# Patient Record
Sex: Male | Born: 1935 | Race: White | Hispanic: No | Marital: Married | Smoking: Former smoker
Health system: Southern US, Community
[De-identification: ages and names within clinical notes are randomized; demographics above are authoritative.]

## PROBLEM LIST (undated history)

## (undated) DIAGNOSIS — G4730 Sleep apnea, unspecified: Secondary | ICD-10-CM

## (undated) DIAGNOSIS — I482 Chronic atrial fibrillation, unspecified: Secondary | ICD-10-CM

## (undated) DIAGNOSIS — F0390 Unspecified dementia without behavioral disturbance: Secondary | ICD-10-CM

## (undated) DIAGNOSIS — I10 Essential (primary) hypertension: Secondary | ICD-10-CM

## (undated) DIAGNOSIS — M1990 Unspecified osteoarthritis, unspecified site: Secondary | ICD-10-CM

## (undated) DIAGNOSIS — R339 Retention of urine, unspecified: Secondary | ICD-10-CM

## (undated) DIAGNOSIS — E119 Type 2 diabetes mellitus without complications: Secondary | ICD-10-CM

## (undated) DIAGNOSIS — E785 Hyperlipidemia, unspecified: Secondary | ICD-10-CM

## (undated) HISTORY — DX: Essential (primary) hypertension: I10

## (undated) HISTORY — DX: Unspecified dementia, unspecified severity, without behavioral disturbance, psychotic disturbance, mood disturbance, and anxiety: F03.90

## (undated) HISTORY — DX: Hyperlipidemia, unspecified: E78.5

## (undated) HISTORY — PX: PROSTATE SURGERY: SHX751

## (undated) HISTORY — PX: BACK SURGERY: SHX140

## (undated) HISTORY — PX: TONSILLECTOMY: SUR1361

## (undated) HISTORY — DX: Chronic atrial fibrillation, unspecified: I48.20

---

## 2000-09-30 ENCOUNTER — Encounter: Admission: RE | Admit: 2000-09-30 | Discharge: 2000-12-29 | Payer: Self-pay | Admitting: Anesthesiology

## 2001-08-05 ENCOUNTER — Encounter: Payer: Self-pay | Admitting: Neurosurgery

## 2001-08-08 ENCOUNTER — Inpatient Hospital Stay (HOSPITAL_COMMUNITY): Admission: RE | Admit: 2001-08-08 | Discharge: 2001-08-09 | Payer: Self-pay | Admitting: Neurosurgery

## 2001-08-08 ENCOUNTER — Encounter: Payer: Self-pay | Admitting: Neurosurgery

## 2004-02-14 ENCOUNTER — Inpatient Hospital Stay (HOSPITAL_COMMUNITY): Admission: EM | Admit: 2004-02-14 | Discharge: 2004-02-24 | Payer: Self-pay | Admitting: Emergency Medicine

## 2004-03-19 ENCOUNTER — Encounter: Admission: RE | Admit: 2004-03-19 | Discharge: 2004-03-19 | Payer: Self-pay | Admitting: Internal Medicine

## 2005-06-08 ENCOUNTER — Observation Stay (HOSPITAL_COMMUNITY): Admission: EM | Admit: 2005-06-08 | Discharge: 2005-06-10 | Payer: Self-pay | Admitting: Emergency Medicine

## 2005-06-08 ENCOUNTER — Ambulatory Visit: Payer: Self-pay | Admitting: Cardiology

## 2005-06-08 ENCOUNTER — Encounter: Payer: Self-pay | Admitting: Cardiology

## 2005-06-10 ENCOUNTER — Ambulatory Visit: Payer: Self-pay

## 2005-07-22 ENCOUNTER — Ambulatory Visit: Payer: Self-pay | Admitting: *Deleted

## 2005-07-24 ENCOUNTER — Ambulatory Visit: Payer: Self-pay

## 2005-07-30 ENCOUNTER — Ambulatory Visit: Payer: Self-pay | Admitting: *Deleted

## 2005-08-03 ENCOUNTER — Ambulatory Visit: Payer: Self-pay | Admitting: Cardiology

## 2005-08-07 ENCOUNTER — Ambulatory Visit: Payer: Self-pay | Admitting: *Deleted

## 2005-08-07 ENCOUNTER — Ambulatory Visit: Payer: Self-pay | Admitting: Internal Medicine

## 2005-08-14 ENCOUNTER — Ambulatory Visit: Payer: Self-pay | Admitting: Internal Medicine

## 2005-08-19 ENCOUNTER — Ambulatory Visit: Payer: Self-pay | Admitting: *Deleted

## 2005-08-21 ENCOUNTER — Ambulatory Visit: Payer: Self-pay | Admitting: Cardiology

## 2005-08-31 ENCOUNTER — Ambulatory Visit: Payer: Self-pay | Admitting: Internal Medicine

## 2005-09-17 ENCOUNTER — Ambulatory Visit: Payer: Self-pay | Admitting: Cardiology

## 2005-10-05 ENCOUNTER — Ambulatory Visit: Payer: Self-pay | Admitting: *Deleted

## 2005-10-15 ENCOUNTER — Ambulatory Visit: Payer: Self-pay | Admitting: Cardiology

## 2005-11-13 ENCOUNTER — Ambulatory Visit: Payer: Self-pay | Admitting: Internal Medicine

## 2005-12-16 ENCOUNTER — Ambulatory Visit: Payer: Self-pay

## 2005-12-24 ENCOUNTER — Ambulatory Visit: Payer: Self-pay | Admitting: Internal Medicine

## 2006-01-05 ENCOUNTER — Ambulatory Visit: Payer: Self-pay | Admitting: *Deleted

## 2006-01-14 ENCOUNTER — Ambulatory Visit: Payer: Self-pay | Admitting: *Deleted

## 2006-01-14 ENCOUNTER — Ambulatory Visit: Payer: Self-pay | Admitting: Cardiology

## 2006-01-22 ENCOUNTER — Ambulatory Visit: Payer: Self-pay | Admitting: Cardiology

## 2006-02-01 ENCOUNTER — Ambulatory Visit: Payer: Self-pay | Admitting: Internal Medicine

## 2006-02-15 ENCOUNTER — Ambulatory Visit: Payer: Self-pay | Admitting: Internal Medicine

## 2006-03-08 ENCOUNTER — Ambulatory Visit: Payer: Self-pay | Admitting: Internal Medicine

## 2006-04-05 ENCOUNTER — Ambulatory Visit: Payer: Self-pay | Admitting: Internal Medicine

## 2006-04-14 ENCOUNTER — Ambulatory Visit: Payer: Self-pay | Admitting: *Deleted

## 2006-04-20 ENCOUNTER — Ambulatory Visit: Payer: Self-pay

## 2006-05-03 ENCOUNTER — Ambulatory Visit: Payer: Self-pay | Admitting: Cardiology

## 2006-05-20 ENCOUNTER — Ambulatory Visit: Payer: Self-pay | Admitting: Cardiology

## 2006-05-24 ENCOUNTER — Ambulatory Visit: Payer: Self-pay | Admitting: Cardiology

## 2006-05-25 ENCOUNTER — Ambulatory Visit: Payer: Self-pay | Admitting: Cardiology

## 2006-11-04 ENCOUNTER — Encounter: Admission: RE | Admit: 2006-11-04 | Discharge: 2006-11-04 | Payer: Self-pay | Admitting: Internal Medicine

## 2007-01-11 ENCOUNTER — Ambulatory Visit: Payer: Self-pay | Admitting: *Deleted

## 2007-05-13 ENCOUNTER — Emergency Department (HOSPITAL_COMMUNITY): Admission: EM | Admit: 2007-05-13 | Discharge: 2007-05-13 | Payer: Self-pay | Admitting: Emergency Medicine

## 2007-05-25 ENCOUNTER — Ambulatory Visit: Payer: Self-pay | Admitting: *Deleted

## 2007-08-24 ENCOUNTER — Ambulatory Visit: Payer: Self-pay | Admitting: Cardiovascular Disease

## 2007-09-15 ENCOUNTER — Ambulatory Visit: Payer: Self-pay | Admitting: Cardiology

## 2007-11-30 ENCOUNTER — Ambulatory Visit: Payer: Self-pay | Admitting: Cardiology

## 2007-11-30 ENCOUNTER — Ambulatory Visit: Payer: Self-pay | Admitting: Internal Medicine

## 2007-12-07 ENCOUNTER — Ambulatory Visit: Payer: Self-pay | Admitting: Cardiovascular Disease

## 2007-12-13 ENCOUNTER — Ambulatory Visit: Payer: Self-pay | Admitting: Cardiology

## 2007-12-27 ENCOUNTER — Ambulatory Visit: Payer: Self-pay | Admitting: Cardiology

## 2007-12-27 LAB — CONVERTED CEMR LAB
INR: 4.4 — ABNORMAL HIGH (ref 0.8–1.0)
Prothrombin Time: 26.9 s — ABNORMAL HIGH (ref 10.9–13.3)

## 2008-01-12 ENCOUNTER — Ambulatory Visit: Payer: Self-pay | Admitting: Cardiology

## 2008-02-02 ENCOUNTER — Ambulatory Visit: Payer: Self-pay | Admitting: Cardiology

## 2008-02-17 ENCOUNTER — Ambulatory Visit: Payer: Self-pay | Admitting: Internal Medicine

## 2008-03-02 ENCOUNTER — Ambulatory Visit: Payer: Self-pay | Admitting: Internal Medicine

## 2008-03-07 ENCOUNTER — Ambulatory Visit: Payer: Self-pay | Admitting: Cardiovascular Disease

## 2008-03-23 ENCOUNTER — Ambulatory Visit: Payer: Self-pay | Admitting: Cardiology

## 2008-04-06 ENCOUNTER — Ambulatory Visit: Payer: Self-pay | Admitting: Internal Medicine

## 2008-04-27 ENCOUNTER — Ambulatory Visit: Payer: Self-pay | Admitting: Cardiology

## 2008-05-25 ENCOUNTER — Ambulatory Visit: Payer: Self-pay | Admitting: Cardiovascular Disease

## 2008-06-21 ENCOUNTER — Ambulatory Visit: Payer: Self-pay | Admitting: Cardiology

## 2008-07-20 ENCOUNTER — Ambulatory Visit: Payer: Self-pay | Admitting: Cardiology

## 2008-08-03 ENCOUNTER — Ambulatory Visit: Payer: Self-pay | Admitting: Cardiovascular Disease

## 2008-08-24 ENCOUNTER — Ambulatory Visit: Payer: Self-pay | Admitting: Cardiovascular Disease

## 2008-09-21 ENCOUNTER — Ambulatory Visit: Payer: Self-pay | Admitting: Cardiovascular Disease

## 2008-10-19 ENCOUNTER — Ambulatory Visit: Payer: Self-pay | Admitting: Internal Medicine

## 2008-11-21 ENCOUNTER — Ambulatory Visit: Payer: Self-pay | Admitting: Internal Medicine

## 2008-11-27 DIAGNOSIS — K219 Gastro-esophageal reflux disease without esophagitis: Secondary | ICD-10-CM

## 2008-11-27 DIAGNOSIS — I10 Essential (primary) hypertension: Secondary | ICD-10-CM

## 2008-11-27 DIAGNOSIS — I482 Chronic atrial fibrillation, unspecified: Secondary | ICD-10-CM

## 2008-11-27 DIAGNOSIS — M129 Arthropathy, unspecified: Secondary | ICD-10-CM

## 2008-11-27 DIAGNOSIS — N400 Enlarged prostate without lower urinary tract symptoms: Secondary | ICD-10-CM

## 2008-11-27 DIAGNOSIS — E785 Hyperlipidemia, unspecified: Secondary | ICD-10-CM | POA: Insufficient documentation

## 2008-11-27 HISTORY — DX: Gastro-esophageal reflux disease without esophagitis: K21.9

## 2008-11-27 HISTORY — DX: Essential (primary) hypertension: I10

## 2008-11-27 HISTORY — DX: Arthropathy, unspecified: M12.9

## 2008-11-27 HISTORY — DX: Chronic atrial fibrillation, unspecified: I48.20

## 2008-11-27 HISTORY — DX: Benign prostatic hyperplasia without lower urinary tract symptoms: N40.0

## 2008-11-28 ENCOUNTER — Ambulatory Visit: Payer: Self-pay | Admitting: Internal Medicine

## 2008-12-04 ENCOUNTER — Ambulatory Visit: Payer: Self-pay | Admitting: Internal Medicine

## 2008-12-05 ENCOUNTER — Ambulatory Visit: Payer: Self-pay | Admitting: Internal Medicine

## 2008-12-05 ENCOUNTER — Ambulatory Visit: Payer: Self-pay | Admitting: Cardiology

## 2008-12-06 ENCOUNTER — Ambulatory Visit: Payer: Self-pay | Admitting: Cardiology

## 2008-12-06 ENCOUNTER — Ambulatory Visit: Payer: Self-pay | Admitting: Internal Medicine

## 2008-12-11 ENCOUNTER — Ambulatory Visit: Payer: Self-pay | Admitting: Internal Medicine

## 2008-12-18 ENCOUNTER — Ambulatory Visit: Payer: Self-pay | Admitting: Internal Medicine

## 2008-12-26 ENCOUNTER — Ambulatory Visit: Payer: Self-pay | Admitting: Internal Medicine

## 2009-01-01 ENCOUNTER — Ambulatory Visit: Payer: Self-pay | Admitting: Internal Medicine

## 2009-01-14 ENCOUNTER — Ambulatory Visit: Payer: Self-pay | Admitting: Internal Medicine

## 2009-01-30 ENCOUNTER — Ambulatory Visit: Payer: Self-pay | Admitting: Internal Medicine

## 2009-02-04 ENCOUNTER — Ambulatory Visit: Payer: Self-pay | Admitting: Internal Medicine

## 2009-02-11 ENCOUNTER — Ambulatory Visit: Payer: Self-pay | Admitting: Internal Medicine

## 2009-02-19 ENCOUNTER — Encounter: Payer: Self-pay | Admitting: Cardiovascular Disease

## 2009-02-19 ENCOUNTER — Ambulatory Visit: Payer: Self-pay | Admitting: Cardiovascular Disease

## 2009-03-04 ENCOUNTER — Ambulatory Visit: Payer: Self-pay | Admitting: Internal Medicine

## 2009-03-13 ENCOUNTER — Ambulatory Visit: Payer: Self-pay | Admitting: Internal Medicine

## 2009-04-03 ENCOUNTER — Ambulatory Visit: Payer: Self-pay | Admitting: Internal Medicine

## 2009-04-04 ENCOUNTER — Encounter: Payer: Self-pay | Admitting: Cardiovascular Disease

## 2009-04-17 ENCOUNTER — Ambulatory Visit: Payer: Self-pay | Admitting: Internal Medicine

## 2009-05-07 ENCOUNTER — Encounter: Payer: Self-pay | Admitting: Cardiovascular Disease

## 2009-05-07 ENCOUNTER — Ambulatory Visit: Payer: Self-pay | Admitting: Internal Medicine

## 2009-05-21 ENCOUNTER — Encounter: Payer: Self-pay | Admitting: *Deleted

## 2009-06-03 ENCOUNTER — Ambulatory Visit: Payer: Self-pay | Admitting: Internal Medicine

## 2009-06-04 ENCOUNTER — Encounter: Payer: Self-pay | Admitting: Internal Medicine

## 2009-06-10 ENCOUNTER — Ambulatory Visit: Payer: Self-pay | Admitting: Internal Medicine

## 2009-06-17 ENCOUNTER — Ambulatory Visit: Payer: Self-pay | Admitting: Internal Medicine

## 2009-06-26 ENCOUNTER — Encounter: Payer: Self-pay | Admitting: *Deleted

## 2009-07-01 ENCOUNTER — Ambulatory Visit: Payer: Self-pay | Admitting: Internal Medicine

## 2009-07-29 ENCOUNTER — Ambulatory Visit: Payer: Self-pay | Admitting: Internal Medicine

## 2009-08-27 ENCOUNTER — Ambulatory Visit: Payer: Self-pay | Admitting: Internal Medicine

## 2009-08-27 ENCOUNTER — Encounter: Payer: Self-pay | Admitting: Cardiovascular Disease

## 2009-09-24 ENCOUNTER — Encounter: Payer: Self-pay | Admitting: Cardiovascular Disease

## 2009-09-24 ENCOUNTER — Ambulatory Visit: Payer: Self-pay | Admitting: Internal Medicine

## 2009-10-15 ENCOUNTER — Telehealth: Payer: Self-pay | Admitting: Cardiovascular Disease

## 2009-10-24 ENCOUNTER — Encounter: Payer: Self-pay | Admitting: Cardiovascular Disease

## 2009-10-24 ENCOUNTER — Ambulatory Visit: Payer: Self-pay | Admitting: Internal Medicine

## 2009-10-30 ENCOUNTER — Telehealth (INDEPENDENT_AMBULATORY_CARE_PROVIDER_SITE_OTHER): Payer: Self-pay | Admitting: *Deleted

## 2009-10-30 ENCOUNTER — Encounter (INDEPENDENT_AMBULATORY_CARE_PROVIDER_SITE_OTHER): Payer: Self-pay | Admitting: *Deleted

## 2009-11-04 ENCOUNTER — Ambulatory Visit: Payer: Self-pay | Admitting: Internal Medicine

## 2009-11-11 ENCOUNTER — Ambulatory Visit: Payer: Self-pay | Admitting: Internal Medicine

## 2009-11-26 ENCOUNTER — Ambulatory Visit: Payer: Self-pay | Admitting: Internal Medicine

## 2009-11-26 ENCOUNTER — Encounter: Payer: Self-pay | Admitting: Cardiovascular Disease

## 2009-11-27 ENCOUNTER — Encounter: Payer: Self-pay | Admitting: Cardiovascular Disease

## 2009-12-24 ENCOUNTER — Ambulatory Visit: Payer: Self-pay | Admitting: Internal Medicine

## 2010-01-07 ENCOUNTER — Ambulatory Visit: Payer: Self-pay | Admitting: Internal Medicine

## 2010-01-22 ENCOUNTER — Ambulatory Visit: Payer: Self-pay | Admitting: Internal Medicine

## 2010-02-24 ENCOUNTER — Encounter: Payer: Self-pay | Admitting: Cardiovascular Disease

## 2010-02-24 ENCOUNTER — Ambulatory Visit: Payer: Self-pay | Admitting: Internal Medicine

## 2010-03-06 ENCOUNTER — Encounter: Payer: Self-pay | Admitting: Cardiovascular Disease

## 2010-03-10 ENCOUNTER — Ambulatory Visit: Payer: Self-pay | Admitting: Internal Medicine

## 2010-03-25 ENCOUNTER — Ambulatory Visit: Payer: Self-pay | Admitting: Internal Medicine

## 2010-04-22 ENCOUNTER — Ambulatory Visit: Payer: Self-pay | Admitting: Internal Medicine

## 2010-05-17 ENCOUNTER — Encounter: Payer: Self-pay | Admitting: Cardiovascular Disease

## 2010-05-20 ENCOUNTER — Telehealth: Payer: Self-pay | Admitting: Cardiovascular Disease

## 2010-05-22 ENCOUNTER — Ambulatory Visit: Payer: Self-pay | Admitting: Internal Medicine

## 2010-05-22 ENCOUNTER — Encounter: Payer: Self-pay | Admitting: Cardiovascular Disease

## 2010-05-23 ENCOUNTER — Ambulatory Visit: Payer: Self-pay | Admitting: Cardiovascular Disease

## 2010-05-23 ENCOUNTER — Telehealth: Payer: Self-pay | Admitting: Cardiovascular Disease

## 2010-05-24 LAB — CONVERTED CEMR LAB
Chloride: 97 meq/L (ref 96–112)
Creatinine, Ser: 1.8 mg/dL — ABNORMAL HIGH (ref 0.4–1.5)
GFR calc non Af Amer: 39.56 mL/min (ref >60)
Potassium: 3.3 meq/L — ABNORMAL LOW (ref 3.5–5.1)

## 2010-05-27 ENCOUNTER — Encounter: Payer: Self-pay | Admitting: Cardiovascular Disease

## 2010-05-27 ENCOUNTER — Ambulatory Visit: Payer: Self-pay | Admitting: Internal Medicine

## 2010-05-27 DIAGNOSIS — R0989 Other specified symptoms and signs involving the circulatory and respiratory systems: Secondary | ICD-10-CM

## 2010-05-27 DIAGNOSIS — R0609 Other forms of dyspnea: Secondary | ICD-10-CM | POA: Insufficient documentation

## 2010-05-27 HISTORY — DX: Other specified symptoms and signs involving the circulatory and respiratory systems: R09.89

## 2010-05-27 HISTORY — DX: Other forms of dyspnea: R06.09

## 2010-05-29 ENCOUNTER — Encounter: Payer: Self-pay | Admitting: Cardiovascular Disease

## 2010-06-02 ENCOUNTER — Encounter: Admission: RE | Admit: 2010-06-02 | Discharge: 2010-06-02 | Payer: Self-pay | Admitting: Internal Medicine

## 2010-06-02 LAB — CONVERTED CEMR LAB
CO2: 33 meq/L — ABNORMAL HIGH (ref 19–32)
Calcium: 9.3 mg/dL (ref 8.4–10.5)
GFR calc non Af Amer: 34.94 mL/min (ref >60)
Glucose, Bld: 124 mg/dL — ABNORMAL HIGH (ref 70–99)
Potassium: 3.8 meq/L (ref 3.5–5.1)
Sodium: 140 meq/L (ref 135–145)

## 2010-06-06 ENCOUNTER — Ambulatory Visit: Payer: Self-pay

## 2010-06-09 ENCOUNTER — Ambulatory Visit: Payer: Self-pay | Admitting: Internal Medicine

## 2010-06-17 ENCOUNTER — Ambulatory Visit: Payer: Self-pay | Admitting: Cardiology

## 2010-06-17 ENCOUNTER — Ambulatory Visit (HOSPITAL_COMMUNITY): Admission: RE | Admit: 2010-06-17 | Discharge: 2010-06-17 | Payer: Self-pay | Admitting: Cardiovascular Disease

## 2010-06-17 ENCOUNTER — Encounter: Payer: Self-pay | Admitting: Cardiovascular Disease

## 2010-06-24 ENCOUNTER — Ambulatory Visit: Payer: Self-pay | Admitting: Internal Medicine

## 2010-07-21 ENCOUNTER — Ambulatory Visit: Payer: Self-pay | Admitting: Internal Medicine

## 2010-08-21 ENCOUNTER — Ambulatory Visit: Payer: Self-pay | Admitting: Internal Medicine

## 2010-08-21 ENCOUNTER — Encounter: Payer: Self-pay | Admitting: Cardiovascular Disease

## 2010-09-22 ENCOUNTER — Ambulatory Visit: Payer: Self-pay | Admitting: Internal Medicine

## 2010-10-20 ENCOUNTER — Ambulatory Visit: Payer: Self-pay | Admitting: Internal Medicine

## 2010-11-10 ENCOUNTER — Ambulatory Visit: Payer: Self-pay | Admitting: Internal Medicine

## 2010-11-18 ENCOUNTER — Encounter: Payer: Self-pay | Admitting: Cardiovascular Disease

## 2010-11-18 ENCOUNTER — Ambulatory Visit: Payer: Self-pay | Admitting: Internal Medicine

## 2010-11-21 ENCOUNTER — Ambulatory Visit: Payer: Self-pay | Admitting: Cardiovascular Disease

## 2010-12-01 ENCOUNTER — Encounter: Payer: Self-pay | Admitting: Cardiovascular Disease

## 2010-12-01 ENCOUNTER — Ambulatory Visit: Payer: Self-pay

## 2010-12-01 ENCOUNTER — Ambulatory Visit: Payer: Self-pay | Admitting: Cardiovascular Disease

## 2010-12-18 ENCOUNTER — Ambulatory Visit: Payer: Self-pay | Admitting: Internal Medicine

## 2011-01-19 ENCOUNTER — Ambulatory Visit
Admission: RE | Admit: 2011-01-19 | Discharge: 2011-01-19 | Payer: Self-pay | Source: Home / Self Care | Attending: Internal Medicine | Admitting: Internal Medicine

## 2011-01-20 NOTE — Assessment & Plan Note (Signed)
Summary: [facility]   Visit Type:  Follow-up Primary Provider:  Dr Wylene Simmer  CC:  Low potassium- Sob.  History of Present Illness: 77 year old male with atrial fibrillation presenting today for follow-up evaluation. The patient has had recent troubles with cough and shortness of breath. He was seen at Urgent Care last wk and diagnosed with bronchitis and started on ABx. He was also told he had CHF.   He complains of continued productive cough with clear sputum. Denies fever, chills. Denies chest pain. He has chronic orthopnea and complains of worsening dyspnea since his respiratory illness has started.  He was also prescribed a short course of furosemide and had a lot of difficulty with urinary incontinence while taking the diuretic.  Current Medications (verified): 1)  Engage Study Drug .... Take As Directed By [facility] Nurse 2)  Vitamin C Cr 500 Mg Cr-Tabs (Ascorbic Acid) .... Take 1 Tablet By Mouth Once A Day 3)  Metoprolol Succinate 50 Mg Xr24h-Tab (Metoprolol Succinate) .... Take One Tablet By Mouth Twicw Daily 4)  Hydrochlorothiazide 12.5 Mg Tabs (Hydrochlorothiazide) .... Take One Tablet By Mouth Daily. 5)  Pantoprazole Sodium 40 Mg Tbec (Pantoprazole Sodium) .... Take 1 Tablet By Mouth Once A Day 6)  Niaspan 500 Mg Cr-Tabs (Niacin (Antihyperlipidemic)) .... Take 1 Tablet By Mouth Once A Day 7)  Vesicare 5 Mg Tabs (Solifenacin Succinate) .... Take 1 Tablet By Mouth Two Times A Day 8)  Multivitamins  Tabs (Multiple Vitamin) .... Take 1 Tablet By Mouth Once A Day  Allergies: 1)  ! Sulfa  Past History:  Past medical history reviewed for relevance to current acute and chronic problems.  Past Medical History: Reviewed history from 11/27/2008 and no changes required. Current Problems:  GERD (ICD-530.81) HYPERLIPIDEMIA-MIXED (ICD-272.4) HYPERTENSION, BENIGN (ICD-401.1) Atrial Fibrillation Paroxysmal Chronic Coumadin BPH Spondylolisthesis, L5-S1, with secondary  stenosis, with right L5 radiculopathy. Degenerative disk disease  Review of Systems       Negative except as per HPI   Vital Signs:  Patient profile:   77 year old male Height:      66 inches Weight:      221 pounds BMI:     35.80 Pulse rate:   88 / minute Pulse rhythm:   irregular Resp:     20 per minute BP sitting:   112 / 70  (right arm) Cuff size:   large  Vitals Entered By: Vikki Ports (May 27, 2010 3:18 PM)  Physical Exam  General:  Pt is alert and oriented, elderly, obese male, in no acute distress. He is coughing frequently during the evaluation. HEENT: normal Neck: normal carotid upstrokes without bruits, JVP normal Lungs: scattered rhonchi, no rales. CV: irregularly irregular Abd: soft, NT, positive BS, no bruit, no organomegaly Ext: no clubbing, cyanosis, or edema. peripheral pulses 2+ and equal Skin: warm and dry without rash    EKG  Procedure date:  05/27/2010  Findings:      Atrial fibrillation with low voltage, HR 88 bpm.  Impression & Recommendations:  Problem # 1:  OTHER DYSPNEA AND RESPIRATORY ABNORMALITIES (ICD-786.09) The patient has an upper respiratory infection. I don't think CHF is playing a major role. Will check an echo to rule out new LV dysfunction. His atrial fib is chronic and heart rate is ok at present. He did not improve with a therapeutic trial of lasix. He sees Dr Wylene Simmer later this week.  His updated medication list for this problem includes:    Metoprolol Succinate 50  Mg Xr24h-tab (Metoprolol succinate) .Marland Kitchen... Take one tablet by mouth twicw daily    Hydrochlorothiazide 12.5 Mg Tabs (Hydrochlorothiazide) .Marland Kitchen... Take one tablet by mouth daily.  Orders: EKG w/ Interpretation (93000) Echocardiogram (Echo) TLB-BMP (Basic Metabolic Panel-BMET) (80048-METABOL)  Problem # 2:  ATRIAL FIBRILLATION (ICD-427.31) Stable, continue Engage study drug for anticoagulation and metoprolol for rate control. He does not have active wheezing on  exam. His updated medication list for this problem includes:    Metoprolol Succinate 50 Mg Xr24h-tab (Metoprolol succinate) .Marland Kitchen... Take one tablet by mouth twicw daily  Orders: EKG w/ Interpretation (93000) Echocardiogram (Echo) TLB-BMP (Basic Metabolic Panel-BMET) (80048-METABOL)  Patient Instructions: 1)  Your physician recommends that you have lab work today: BMP 2)  Your physician wants you to follow-up in:  1 YEAR. You will receive a reminder letter in the mail two months in advance. If you don't receive a letter, please call our office to schedule the follow-up appointment. 3)  Your physician has requested that you have an echocardiogram.  Echocardiography is a painless test that uses sound waves to create images of your heart. It provides your doctor with information about the size and shape of your heart and how well your heart's chambers and valves are working.  This procedure takes approximately one hour. There are no restrictions for this procedure.

## 2011-01-20 NOTE — Progress Notes (Signed)
  Phone Note Outgoing Call      New/Updated Medications: K-TABS 10 MEQ CR-TABS (POTASSIUM CHLORIDE) take 1 tablet by mouth daily Prescriptions: K-TABS 10 MEQ CR-TABS (POTASSIUM CHLORIDE) take 1 tablet by mouth daily  #30 x 3   Entered by:   Lisabeth Devoid RN   Authorized by:   Norva Karvonen, MD   Signed by:   Lisabeth Devoid RN on 05/23/2010   Method used:   [facility]        [facility]  [address] [phone]* (retail)       [address]       [place], [place]  [address]       Ph: [phone] or [phone]       Fax: [phone]   RxID:   [id number]   Appended Document:  called and spoke with patient's wife. he is stable with no real change in symptoms. added onto schedule next week and started KCl 10 meq.

## 2011-01-20 NOTE — Consult Note (Signed)
Summary: [facility]   Imported By: Marylou Mccoy 05/22/2010 16:25:05  _____________________________________________________________________  External Attachment:    Type:   Image     Comment:   External Document

## 2011-01-20 NOTE — Letter (Signed)
Summary: [facility] Engage Study Prohibited Medication List  [facility] Engage Study Prohibited Medication List   Imported By: Roderic Ovens 04/09/2010 11:09:02  _____________________________________________________________________  External Attachment:    Type:   Image     Comment:   External Document

## 2011-01-20 NOTE — Assessment & Plan Note (Signed)
Summary: abnormal ekg    Visit Type:  ov Primary Provider:  Dr Wylene Simmer  CC:  shortness of breath and dizziness.  History of Present Illness: 77 year old male with atrial fibrillation presenting today for follow-up evaluation. He was recently seen by our Research group and was noted to have elevated heart rates. He has chronic AF but his rates have been running consistently greater than 100 bpm. He denies chest pain or pressure. He complains of chronic cough, dyspnea, and fatigue. He leads a very sedentary lifestyle. No other complaints.  Current Medications (verified): 1)  Engage Study Drug .... Take As Directed By [facility] Nurse 2)  Vitamin C Cr 500 Mg Cr-Tabs (Ascorbic Acid) .... Take 1 Tablet By Mouth Once A Day 3)  Metoprolol Succinate 50 Mg Xr24h-Tab (Metoprolol Succinate) .... Take One Tablet By Mouth Twicw Daily 4)  Hydrochlorothiazide 12.5 Mg Tabs (Hydrochlorothiazide) .... Take One Tablet By Mouth Daily. 5)  Pantoprazole Sodium 40 Mg Tbec (Pantoprazole Sodium) .... Take 1 Tablet By Mouth Once A Day 6)  Niaspan 500 Mg Cr-Tabs (Niacin (Antihyperlipidemic)) .... Take 1 Tablet By Mouth Once A Day 7)  Multivitamins  Tabs (Multiple Vitamin) .... Take 1 Tablet By Mouth Once A Day  Allergies (verified): 1)  ! Sulfa  Past History:  Past medical history reviewed for relevance to current acute and chronic problems.  Past Medical History: Reviewed history from 11/27/2008 and no changes required. Current Problems:  GERD (ICD-530.81) HYPERLIPIDEMIA-MIXED (ICD-272.4) HYPERTENSION, BENIGN (ICD-401.1) Atrial Fibrillation Paroxysmal Chronic Coumadin BPH Spondylolisthesis, L5-S1, with secondary stenosis, with right L5 radiculopathy. Degenerative disk disease  Review of Systems       Negative except as per HPI   Vital Signs:  Patient profile:   77 year old male Height:      66 inches Weight:      227.25 pounds BMI:     36.81 Pulse rate:   109 / minute Resp:     22 per  minute BP sitting:   98 / 64  (left arm) Cuff size:   regular  Vitals Entered By: Caralee Ates CMA (November 21, 2010 2:09 PM)  Physical Exam  General:  Pt is alert and oriented, elderly, obese male, in no acute distress.  HEENT: normal Neck: normal carotid upstrokes without bruits, JVP normal Lungs: scattered rhonchi, no rales. CV: irregularly irregular, tachycardic Abd: soft, obese, NT, positive BS Ext: 1+ bilateral pretibial edema.  Skin: warm and dry with stasis changes predominately on left leg    EKG  Procedure date:  11/21/2010  Findings:      Atrial fibrillation HR 109 bpm, incomplete RBBB  Impression & Recommendations:  Problem # 1:  ATRIAL FIBRILLATION (ICD-427.31) Pt with elevated heart rate, otherwise unchanged. He is followed through the Engage study protocol. Recommend add digoxin 0.125 mg daily and check a dig level in 2 weeks since he has CKD. Will check a repeat EKG in 2 weeks as well. Clinical followup in 3 months.  His updated medication list for this problem includes:    Metoprolol Succinate 50 Mg Xr24h-tab (Metoprolol succinate) .Marland Kitchen... Take one tablet by mouth twicw daily    Digoxin 0.125 Mg Tabs (Digoxin) .Marland Kitchen... Take one tablet by mouth daily  His updated medication list for this problem includes:    Metoprolol Succinate 50 Mg Xr24h-tab (Metoprolol succinate) .Marland Kitchen... Take one tablet by mouth twicw daily    Digoxin 0.125 Mg Tabs (Digoxin) .Marland Kitchen... Take one tablet by mouth daily  Problem # 2:  HYPERTENSION, BENIGN (ICD-401.1) BP low today, favor digoxin over increasing metoprolol because of low BP.  His updated medication list for this problem includes:    Metoprolol Succinate 50 Mg Xr24h-tab (Metoprolol succinate) .Marland Kitchen... Take one tablet by mouth twicw daily    Hydrochlorothiazide 12.5 Mg Tabs (Hydrochlorothiazide) .Marland Kitchen... Take one tablet by mouth daily.  BP today: 98/64 Prior BP: 112/70 (05/27/2010)  Labs Reviewed: K+: 3.8 (05/27/2010) Creat: : 2.0  (05/27/2010)     Problem # 3:  HYPERLIPIDEMIA-MIXED (ICD-272.4)  His updated medication list for this problem includes:    Niaspan 500 Mg Cr-tabs (Niacin (antihyperlipidemic)) .Marland Kitchen... Take 1 tablet by mouth once a day  His updated medication list for this problem includes:    Niaspan 500 Mg Cr-tabs (Niacin (antihyperlipidemic)) .Marland Kitchen... Take 1 tablet by mouth once a day  Patient Instructions: 1)  Your physician has recommended you make the following change in your medication: START Digoxin 0.125mg  take one tablet daily 2)  Your physician wants you to follow-up in:  3 MONTHS with Dr Excell Seltzer.  You will receive a reminder letter in the mail two months in advance. If you don't receive a letter, please call our office to schedule the follow-up appointment. 3)  Your physician recommends that you return for lab work and and EKG in 2 WEEKS: Digoxin level (427.31)--do not take digoxin the morning of lab work  Prescriptions: DIGOXIN 0.125 MG TABS (DIGOXIN) take one tablet by mouth daily  #30 x 6   Entered by:   Julieta Gutting, RN, BSN   Authorized by:   Norva Karvonen, MD   Signed by:   Julieta Gutting, RN, BSN on 11/21/2010   Method used:   [facility]  [address] [phone]* (retail)       [address]       [place], [place]  [id number]       Ph: [phone] or [phone]       Fax: [phone]   RxID:   [id number]

## 2011-01-20 NOTE — Progress Notes (Signed)
Summary: seen in urgent care  Phone Note Call from Patient Call back at Home Phone [phone] Call back at [phone]   Caller: Spouse Reason for Call: Talk to Nurse Summary of Call: was seen @ urgent care on Saturday, request call Initial call taken by: Migdalia Dk,  May 20, 2010 9:46 AM  Follow-up for Phone Call        I spoke with the pt's wife and she said the pt was seen at [facility] on Saturday.  The pt's oxygen was low, he was out of rhythm, fluid in lungs and a touch of bronchitis.  The pt was started on Furosemide and an antibiotic. I asked the pt's wife if he would like to be seen today by Dr Excell Seltzer.  She did not want to make an appt at this time.  I instructed her to follow the instructions that were given to her by Urgent Care and call the office if the pt's symptoms are not improving.  She agreed with plan.    Follow-up by: Julieta Gutting, RN, BSN,  May 20, 2010 10:22 AM

## 2011-01-22 NOTE — Assessment & Plan Note (Signed)
Summary: ekg/427.31/also labs/saf  Nurse Visit   Allergies: 1)  ! Sulfa   Patient Instructions: 1)  Dr Excell Seltzer has reviewed EKG. Pt advised will receive a call regarding lab: digoxin level. Pt verbalizes understanding.

## 2011-02-16 ENCOUNTER — Encounter (INDEPENDENT_AMBULATORY_CARE_PROVIDER_SITE_OTHER): Payer: Self-pay

## 2011-02-16 ENCOUNTER — Encounter: Payer: Self-pay | Admitting: Cardiovascular Disease

## 2011-02-16 DIAGNOSIS — R0989 Other specified symptoms and signs involving the circulatory and respiratory systems: Secondary | ICD-10-CM

## 2011-02-24 ENCOUNTER — Encounter (INDEPENDENT_AMBULATORY_CARE_PROVIDER_SITE_OTHER): Payer: Self-pay

## 2011-02-24 DIAGNOSIS — R0989 Other specified symptoms and signs involving the circulatory and respiratory systems: Secondary | ICD-10-CM

## 2011-03-03 ENCOUNTER — Ambulatory Visit (INDEPENDENT_AMBULATORY_CARE_PROVIDER_SITE_OTHER): Payer: Medicare Other | Admitting: Cardiovascular Disease

## 2011-03-03 ENCOUNTER — Encounter: Payer: Self-pay | Admitting: Cardiovascular Disease

## 2011-03-03 DIAGNOSIS — I4891 Unspecified atrial fibrillation: Secondary | ICD-10-CM

## 2011-03-12 ENCOUNTER — Telehealth: Payer: Self-pay | Admitting: Cardiovascular Disease

## 2011-03-12 DIAGNOSIS — N3281 Overactive bladder: Secondary | ICD-10-CM

## 2011-03-12 NOTE — Telephone Encounter (Signed)
I spoke with the pt's wife and she wanted to make [place] aware that the pt's Enablex was stopped.  The pt has now been started on toviaz 8mg  once a day.  She just wanted to update the medication list. The pt's medication list has not been entered into this system at this time.  Toviaz added to med list.

## 2011-03-18 ENCOUNTER — Encounter (INDEPENDENT_AMBULATORY_CARE_PROVIDER_SITE_OTHER): Payer: Medicare Other

## 2011-03-18 DIAGNOSIS — R0989 Other specified symptoms and signs involving the circulatory and respiratory systems: Secondary | ICD-10-CM

## 2011-03-19 NOTE — Assessment & Plan Note (Signed)
Summary: [facility]   Visit Type:  Follow-up Primary Provider:  Dr Wylene Simmer  CC:  irregular heart beat.  History of Present Illness: 77 year old male with atrial fibrillation presenting today for follow-up evaluation. He is followed for chronic atrial fibrillation and CAD. The patient is very sedentary. He denies chest pain. Chronic dyspnea is unchanged. He has lightheadedness but denies syncope. No other complaints.  Current Medications (verified): 1)  Engage Study Drug .... Take As Directed By [facility] Nurse 2)  Vitamin C Cr 500 Mg Cr-Tabs (Ascorbic Acid) .... Take 1 Tablet By Mouth Once A Day 3)  Metoprolol Succinate 50 Mg Xr24h-Tab (Metoprolol Succinate) .... Take One Tablet By Mouth  Once A Day 4)  Hydrochlorothiazide 12.5 Mg Tabs (Hydrochlorothiazide) .... Take One Tablet By Mouth Daily. 5)  Pantoprazole Sodium 40 Mg Tbec (Pantoprazole Sodium) .... Take 1 Tablet By Mouth Once A Day 6)  Niaspan 500 Mg Cr-Tabs (Niacin (Antihyperlipidemic)) .... Take 1 Tablet By Mouth Once A Day 7)  Multivitamins  Tabs (Multiple Vitamin) .... Take 1 Tablet By Mouth Once A Day 8)  Digoxin 0.125 Mg Tabs (Digoxin) .... Take One Tablet By Mouth Daily 9)  Tamsulosin Hcl 0.4 Mg Caps (Tamsulosin Hcl) .... Take 1 Pill By Mouth Daily. 10)  Enablex .... Take One By Mouth Daily (Dr Vonita Moss Gave Pt Samples)  Allergies: 1)  ! Sulfa  Past History:  Past medical history reviewed for relevance to current acute and chronic problems.  Past Medical History: Reviewed history from 11/27/2008 and no changes required. Current Problems:  GERD (ICD-530.81) HYPERLIPIDEMIA-MIXED (ICD-272.4) HYPERTENSION, BENIGN (ICD-401.1) Atrial Fibrillation Paroxysmal Chronic Coumadin BPH Spondylolisthesis, L5-S1, with secondary stenosis, with right L5 radiculopathy. Degenerative disk disease  Review of Systems       Positive for chronic cough, otherwise negative except as per HPI   Vital Signs:  Patient profile:   77 year old  male Height:      66 inches Weight:      223.75 pounds BMI:     36.24 Pulse rate:   68 / minute Pulse rhythm:   irregular Resp:     20 per minute BP sitting:   114 / 60  (left arm) Cuff size:   large  Vitals Entered By: Vikki Ports (March 03, 2011 11:11 AM)  Physical Exam  General:  Pt is alert and oriented, elderly, obese male, in no acute distress.  HEENT: normal Neck: normal carotid upstrokes without bruits, JVP normal Lungs: scattered rhonchi, no rales. CV: irregularly irregular without murmur or gallop Abd: soft, obese, NT, positive BS Ext: 1+ bilateral pretibial edema.  Skin: warm and dry with stasis changes predominately on left leg    EKG  Procedure date:  03/03/2011  Findings:      Atrial fibrillation with HR 68 bpm.  Impression & Recommendations:  Problem # 1:  ATRIAL FIBRILLATION (ICD-427.31) Heart rate much better controlled with the addition of digoxin. He will continue on metoprolol as well. Anticoagulation per the Engage protocol.  His updated medication list for this problem includes:    Metoprolol Succinate 50 Mg Xr24h-tab (Metoprolol succinate) .Marland Kitchen... Take one tablet by mouth  once a day    Digoxin 0.125 Mg Tabs (Digoxin) .Marland Kitchen... Take one tablet by mouth daily  Orders: EKG w/ Interpretation (93000)  Problem # 2:  HYPERTENSION, BENIGN (ICD-401.1) controlled.  His updated medication list for this problem includes:    Metoprolol Succinate 50 Mg Xr24h-tab (Metoprolol succinate) .Marland Kitchen... Take one tablet by mouth  once  a day    Hydrochlorothiazide 12.5 Mg Tabs (Hydrochlorothiazide) .Marland Kitchen... Take one tablet by mouth daily.  Orders: EKG w/ Interpretation (93000)  BP today: 114/60 Prior BP: 98/64 (11/21/2010)  Labs Reviewed: K+: 3.8 (05/27/2010) Creat: : 2.0 (05/27/2010)     Problem # 3:  HYPERLIPIDEMIA-MIXED (ICD-272.4) Followed by PCP.  His updated medication list for this problem includes:    Niaspan 500 Mg Cr-tabs (Niacin (antihyperlipidemic))  .Marland Kitchen... Take 1 tablet by mouth once a day  Orders: EKG w/ Interpretation (93000)  Patient Instructions: 1)  Your physician recommends that you continue on your current medications as directed. Please refer to the Current Medication list given to you today. 2)  Your physician wants you to follow-up in: 6 MONTHS.  You will receive a reminder letter in the mail two months in advance. If you don't receive a letter, please call our office to schedule the follow-up appointment.

## 2011-04-20 ENCOUNTER — Encounter (INDEPENDENT_AMBULATORY_CARE_PROVIDER_SITE_OTHER): Payer: Medicare Other

## 2011-04-20 DIAGNOSIS — R0989 Other specified symptoms and signs involving the circulatory and respiratory systems: Secondary | ICD-10-CM
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
## 2011-05-05 NOTE — Assessment & Plan Note (Signed)
[facility]                            CARDIOLOGY OFFICE NOTE   NAME:[name], [name] HOOGENDOORN                     MRN:          [id number]  DATE:03/07/2008                            DOB:          1926-08-19    [name] [name] was seen in followup at the [facility] office on  March 07, 2008.  [name] [name] as an 77 year old gentleman with paroxysmal  atrial fibrillation.  He part of the RELY study and now has completed  that trial and is back on chronic Coumadin.  [name] [name] has done  relatively well from a symptomatic standpoint.  He is obese and leads a  very sedentary lifestyle.  With activity, he denies any symptoms.  He  specifically denies chest pain, dyspnea, edema, lightheadedness, syncope  or palpitations.  He has no complaints at this time.   MEDICATIONS:  1. Aspirin 81 mg daily.  2. Vitamin C 500 mg daily.  3. Multivitamin daily.  4. Metoprolol 100 mg daily.  5. Hydrochlorothiazide 12.5 mg daily.  6. Protonix 40 mg daily.  7. Niaspan 500 mg daily.  8. Oxybutynin 5 mg twice daily.  9. Warfarin as directed.   ALLERGIES:  SULFA.   PHYSICAL EXAMINATION:  GENERAL:  The patient is alert and oriented and  in no acute distress.  He is an obese elderly gentleman.  VITAL SIGNS:  Weight 227. Blood pressure 110/72, heart rate 71,  respiratory rate 16.  HEENT:  Normal.  NECK:  Normal carotid upstrokes without bruits.  Jugular venous pressure  is normal.  LUNGS:  Clear bilaterally.  HEART:  Distant and regular.  No murmurs or gallops.  ABDOMEN:  Soft, obese, nontender, no organomegaly.  EXTREMITIES:  There is trace edema on the right and 1+ edema on the left  with venous stasis changes on the left   EKG shows sinus rhythm and is within normal limits.   ASSESSMENT:  1. Paroxysmal atrial fibrillation.  The patient has been in sinus      rhythm at his last to office visits.  Continue warfarin and      metoprolol.  [name] [name] has some gait  unsteadiness but has not had      a fall in the last several months, and he has never had a serious      fall.  I think he remains a reasonable Coumadin candidate.  2. Hypertension.  Blood pressure is well controlled on combination of      metoprolol and hydrochlorothiazide  3. Dyslipidemia.  He remains on Niaspan.  He is followed by Dr.      Wylene Simmer.   For followup, I would like to see [name] [name] back in 1 year.  He is  followed regularly by Dr. Wylene Simmer and is stable at this time from a  cardiac standpoint.     Veverly Fells. Excell Seltzer, MD  Electronically Signed    MDC/MedQ  DD: 03/07/2008  DT: 03/07/2008  Job #: [id number]   cc:   Gaspar Garbe, M.D.

## 2011-05-05 NOTE — Assessment & Plan Note (Signed)
HEALTHCARE                            CARDIOLOGY OFFICE NOTE   NAME:[name] [name]                     MRN:          [id number]  DATE:08/24/2007                            DOB:          February 14, 1926    [name] [name] was seen in outpatient followup in the [facility]  office on August 24, 2007.  He is a 77 year old gentleman who has been  followed in the past by Dr. Glennon Hamilton.  I will be assuming his care  from here forward.  [name] has chronic atrial fibrillation and has  been treated with rate control and anticoagulation.  He is enrolled in  the RE-LY trial.  He has been randomized to dabigatran and is taking  this medication instead of Coumadin.  He denies any cardiovascular  symptoms at this time.  He specifically denies chest pain, dyspnea,  edema, lightheadedness, syncope, or palpitations.  [name] is  sedentary.  He does minimal exercise [facility] [employer] and rides a  stationary bike a few times weekly, but it sounds like he works out at a  very light level.  He has no other complaints at this time.   CURRENT MEDICATIONS:  1. Aspirin 81 mg daily.  2. Vitamin C 500 mg daily.  3. Multivitamin daily.  4. Vitamin E daily.  5. Metoprolol 100 mg daily.  6. Hydrochlorothiazide 12.5 mg daily.  7. Protonix 40 mg daily.  8. Niaspan 500 mg daily.  9. Study drug twice daily.  10.Vesicare 5 mg daily.   ALLERGIES:  SULFA.   PHYSICAL EXAMINATION:  GENERAL:  The patient is an elderly man.  He is  in no acute distress.  He is obese.  VITAL SIGNS: Weight 225 pounds.  Blood pressure 100/64, heart rate 94,  respiratory rate 16.  HEENT:  Normal.  NECK:  Normal carotid upstrokes without bruits.  Jugular venous pressure  is normal.  LUNGS:  Clear bilaterally.  CARDIOVASCULAR:  The apex is discrete.  The heart is irregularly  irregular without murmurs or gallops.  ABDOMEN: Soft, obese, nontender.  No organomegaly.  EXTREMITIES:   There is trace pretibial edema with some stasis dermatitis  bilaterally.   EKG shows atrial fibrillation and is otherwise within normal limits.   ASSESSMENT:  1. Chronic atrial fibrillation.  Continue metoprolol for rate control      and RE-LY study drug for anticoagulation.  Will reassess left      ventricular function at the time of his next visit in 6 months with      a 2-D echocardiogram.  2. Hypertension under optimal control with a combination of metoprolol      and hydrochlorothiazide.  3. Dyslipidemia.  [name] is on Niaspan and is followed by Dr.      Wylene Simmer.   For followup, I would like to see [name] back in 6 months or sooner  if any new problems arise.     Veverly Fells. Excell Seltzer, MD  Electronically Signed    MDC/MedQ  DD: 08/24/2007  DT: 08/24/2007  Job #: [id number]   cc:  Gaspar Garbe, M.D.

## 2011-05-08 NOTE — Procedures (Signed)
[facility]  Patient:    [name] [name], [name] [name]                       MRN: [id number] Proc. Date: 09/30/00 Attending:  Thyra Breed, M.D. CC:         Marlan Palau, M.D.   Procedure Report  DATE OF BIRTH:  Feb 29, 1926  PROCEDURE:  Lumbar epidural steroid injection.  DIAGNOSES:  Lumbar spondylosis with neural foraminal stenosis at multiple levels, most pronounced at L5-S1 to the right.  INDICATIONS:  [name] [name] is a very pleasant 77 year old, who is sent to [place] by Dr. Lesia Sago for a series of lumbar steroid injections.  The patient stated that he was in his usual state of health up until about eight months ago when he began to have increasing lower back discomfort.  He saw [name], chiropractor and was treated with adjustments but did not improve. He was sent by [name] to [name] who initially evaluated the patient August 23, at which time he was concerned about an underlying radiculopathy. He was started on Voltaren and an MRI was obtained on August 21, 2000, which showed multilevel degenerative disk disease with neural foraminal stenosis bilaterally, 3-4 down to L5-S1 most pronounced to the left greater than right at L5-S1.  He was treated with Voltaren which was of minimal improvement. EMGs and nerve conduction studies showed he had possible ______ of the right lumbosacral paraspinal muscles.  After the MRI, he was sent to [place] for a series of lumbar epidural steroid injections.  The patient describes his pain as "hell."  It is a sharp shooting pain in the right buttock down to the right lower extremity to the sole and lateral aspect of his right foot.  It is increased by walking and standing and improved by sitting or lying down.  There is no numbness or tingling or bowel or bladder incontinence.  He is uncertain as to whether he may have weakness associated with this.  He has not responded to chiropractic nor Voltaren.  CURRENT  MEDICATIONS:  Voltaren, hydrochlorothiazide and Prilosec.  ALLERGIES:  SULFONAMIDES and MIDRIN.  FAMILY HISTORY:  Positive for diabetes, strokes, and coronary artery disease.  PAST SURGICAL HISTORY:  Significant for tonsillectomy and prostrate surgery in 1985.  SOCIAL HISTORY:  The patient is a nonsmoker, rarely drinks.  He used to work at [employer] and is retired.  PAST MEDICAL HISTORY:  Active medical problems are gastroesophageal reflux disease and hypertension.  REVIEW OF SYSTEMS:  GENERAL:  Negative.  HEAD:  Negative.  EYES:  Significant for what sounds like amaurosis fugax of the right eye lasting about 10 minutes in recent weeks.  He has bilateral cataracts.  He has not consulted with [name] with regard to the amaurosis fugax.  NOSE, MOUTH, THROAT: Negative.  EARS:  Negative.  PULMONARY:  Negative.  CARDIOVASCULAR: Significant for hypertension, diagnosed recently for which he has been placed on hydrochlorothiazide.  GASTROINTESTINAL:  Significant for gastroesophageal reflux disease for which he takes Prilosec.  GENITOURINARY:  History of prostate problems in the past.  MUSCULOSKELETAL:  Negative.  NEUROLOGIC:  No history of seizure or stroke.  See HPI for pertinent positives.  HEMATOLOGIC: Negative.  CUTANEOUS:  Significant for hives and allergy problems for which he has had allergy shots in the past.  ENDOCRINE:  Negative.  PSYCHIATRIC: Negative.  PHYSICAL EXAMINATION:  VITAL SIGNS:  Blood pressure 153/96, heart rate 101, respiratory rate 16. O2  saturation is 96% with pain level 6/10.  Temperature is 98 degrees.  GENERAL:  This is a pleasant male who looks his stated age.  He is moderately obese.  HEENT:  Head was normocephalic, atraumatic.  Eyes:  Extraocular movements intact with conjunctivae and sclerae clear.  Nose:  Patent naris without discharge.  Oropharynx is free of lesions.  NECK:  Demonstrates mild restriction and range of motion.  Carotids were  2+ and symmetrical without appreciable bruits today.  LUNGS:  Clear.  HEART:  Regular rate and rhythm.  ABDOMEN:  Obese.  GENITALIA/RECTAL:  Examinations not performed.  BACK:  Examination revealed negative straight leg raise sign with no tenderness to percussion over the vertebrae.  EXTREMITIES:  Radial pulse and dorsalis pedis pulses were 2+ and symmetric. He had varicosities of the lower extremities bilaterally.  NEUROLOGICAL:  The patient was oriented to person, place, reason for visit, and time.  Cranial nerves II-XII were grossly intact.  Deep tendon reflexes were symmetric in the upper and lower extremities with downgoing toes. Motor was 5/5 with symmetric bulk and tone.  Pinprick and vibratory sense were intact.  Coordination was grossly intact.  IMPRESSION: 1. Lumbar degenerative disk disease with neural foraminal stenosis with right    lower extremity radicular like pain with no associated neurologic deficits. 2. Hypertension per primary care physician. 3. Gastroesophageal reflux disease, per primary care physician. 4. History of allergic diaphysis with recurrent hives and previous    ______ . 5. Questionable history of blindness in the right eye temporarily for    10 minutes in recent weeks.  Rule out possible underlying amaurosis fugax.    He was encouraged to speak with [name] with regard to this.  DISPOSITION:  I discussed the potential risks, benefits, and limitations of a lumbar epidural steroid injection in detail with the patient as well as reviewed the side effects of corticosteroids.  He is interested in pursing this.  DESCRIPTION OF PROCEDURE:  After informed consent was obtained, the patient was placed in the sitting position on a monitor.  His back was prepped with Betadine x 3.  A skin well was raised at the L4-5 interspace with 1% lidocaine.  A 20 gauge Tuohy needle was introduced into the lumbar epidural  space to a loss of resistance to  preservative-free saline.  There was no CSF nor blood.  Then 80 mg of Medrol and 10 mL of preservative-free normal saline was gently injected.  The needle was flushed with preservative-free normal saline and removed intact.  POSTPROCEDURE CONDITION:  Stable.  DISCHARGE INSTRUCTIONS: 1. Resume previous diet. 2. Limitation activities per instruction sheet. 3. Continue on current medications with the exception of stopping the    diclofenac since he is not responding to this. 4. Follow up with me in one to two weeks for repeat injection. DD:  09/30/00 TD:  10/02/00 Job: [id number]

## 2011-05-08 NOTE — Procedures (Signed)
[facility]  Patient:    [name] [name], [name] [name]                     MRN: [id number] Proc. Date: 10/07/00 Adm. Date:  [id number] Attending:  Thyra Breed CC:         Marlan Palau, M.D.   Procedure Report  PREOPERATIVE DIAGNOSIS:  Lumbar spondylosis with neuroforaminal stenosis at multiple levels.  POSTOPERATIVE DIAGNOSIS:  Lumbar spondylosis with neuroforaminal stenosis at multiple levels.  PROCEDURE:  Lumbar epidural steroid injection.  SURGEON:  Thyra Breed, M.D.  INTERVAL HISTORY:  The patient has noted a marked improvement.  He now has only intermittent radicular discomfort and this is fairly infrequent.  He is very pleased with his outcome to date, and is asking a lot about exercising, and I recommended that he get [name] books on the back.  PHYSICAL EXAMINATION:  Blood pressure 156/90, heart rate 85, respiratory rate 16, and O2 saturations 96%.  His neurologic exam is unchanged from previously. His back exam shows good healing.  DESCRIPTION OF PROCEDURE:  After informed consent was obtained, the patient was placed in the sitting position and monitored.  His back was prepped with Betadine x 3.  His skin level was raised at the L4-5 interspace with 1% lidocaine.  A 20-gauge Tuohy needle was introduced to the lumbar epidural space and loss of resistance to preservative-free normal saline.  There was no CSF nor blood.  Eighty milligrams of Medrol and 10 ml of preservative-free normal saline was gently injected.  The needle was flushed with preservative-free normal saline and removed intact.  POSTPROCEDURE CONDITION:  Stable.  DISCHARGE INSTRUCTIONS: 1. Resume previous diet. 2. Limitation and activities per instruction sheet. 3. Continue on current medications. 4. Followup with me in 1-2 weeks for repeat injection. DD:  10/07/00 TD:  10/07/00 Job: [id number]

## 2011-05-08 NOTE — Consult Note (Signed)
NAME:  [name] [name], [name] [name] NO.:  [id number]   MEDICAL RECORD NO.:  [id number]          PATIENT TYPE:  INP   LOCATION:  3733                         FACILITY:  [facility]   PHYSICIAN:  Arturo Morton. Riley Kill, M.D. [facility] OF BIRTH:  [birth year]   DATE OF CONSULTATION:  06/08/2005  DATE OF DISCHARGE:                                   CONSULTATION   CHIEF COMPLAINT:  Chest and back pain.   HISTORY OF PRESENT ILLNESS:  Mr. [name] [name] is a 77 year old gentleman who was  awakened at about 3 a.m. with some discomfort that occurred in the left  parasternal region.  It was a little higher than usual and radiated to the  back.  He denies any shortness of breath or nausea or vomiting.  He went to  the living room and did some back stretching exercises but this did not  really relieve the pain completely.  He came to the emergency room where he  was given nitroglycerin with some relief of discomfort. Importantly, the  patient had a heavy meal last night which consisted of beef and baked  potato, and he had a fair amount of gas and felt bloated.  When he arrived  in the emergency room he was in atrial fibrillation with rapid ventricular  response.  He was subsequently converted, and is in normal sinus rhythm with  some mild diffuse ST elevation.   Pertinent to his cardiac history is a history of pneumonia in February of  2005.  He was started on Coumadin for atrial fibrillation.  He then  developed an anemia but it occurred in a setting of an INR of 7.  Coumadin  was subsequently stopped.   PAST MEDICAL HISTORY:  Remarkable for lower back arthritis. He had a  decompression laminectomy in August of 2002.  He has hypertension and  hyperlipidemia. There was a right cataract surgery in October of 2003 and  transurethral prostatectomy in 1992.   ALLERGIES:  SULFA.   CURRENT MEDICATIONS:  He does not know what medicines he is currently  taking.   SOCIAL HISTORY:  The patient lives in  [place] with his wife of 49 years.  He is retired from [employer].  He smoked a half a pack per day for 50 years, but  has not smoked in the last 15 years.  He does not drink alcohol.  He works  out at [employer] three times a week and does the bike for up to 30 minutes.   FAMILY HISTORY:  His mother died at 21 of a CVA and his father died at 68 of  sudden death.  He has a brother who died at 43 of a CVA and a brother who is  alive with a CVA.   REVIEW OF SYSTEMS:  He denies fevers, chills or sweats. There is perhaps  slight increase in pain with inspiration.  He wears glasses.  He has some  nocturia. He has discomfort in the lower back.  Review of systems is  otherwise negative. He does snore.   PHYSICAL EXAMINATION:  GENERAL:  He is an alert, oriented  gentleman in no  acute distress.  VITAL SIGNS:  Currently the temperature is 98.3.  The pulse is 75 and  regular.  Blood pressure is approximately 115/70 and respiratory rate is 22.  SAO2 is 94% on room air.  He is well developed and in no acute distress.  NECK:  Veins are not distended.  HEENT:  Unremarkable.  LUNGS:  Clear to auscultation and percussion.  CARDIOVASCULAR:  The PMI is not displaced.  I cannot appreciate a  significant rub at the present time.  I also could not appreciate a  significant murmur.  First and second heart sounds are normal.  Pulses are  equal bilaterally.  ABDOMEN:  No hepatosplenomegaly.  EXTREMITIES:  Lower extremity varicosities but no major edema.  NEUROLOGIC:  Exam was grossly intact.   LABORATORY DATA:  A CT of the chest was done. There was evidence of  atheromatous calcification. There was no dissection or pulmonary embolus.  EKG was done and repeated this morning.  The EKG reveals normal sinus  rhythm. There was mild diffuse ST elevation. The ST elevation was noted in  V2 through V6.  There was also ST elevation in 1, v2 and aVL and no ST  elevation in 3 and aVF.  Echocardiogram was done at the bedside  and I have  discussed with Olga Millers, M.D. and reviewed the echocardiogram myself.  The echocardiogram reveals vigorous global systolic function.  Dr. Jens Som  questioned mild inferior hypokinesis but certainly there are no changes in  the anterior wall and the inferior changes may be related to the axis and  angle of the image and in the long axis view, the posterior wall appears to  move well.   The initial set of enzymes in the emergency room were negative for MI and  the first set of enzymes that we repeated here at 8:40 this morning were  also negative.   IMPRESSION:  1.  Chest and midback pain of uncertain etiology.  2.  Atheromatous calcifications by CT.  3.  Paroxysmal atrial fibrillation with conversion to sinus rhythm.  4.  Diffuse ST elevation question etiology with no obvious rub or      significant effusion on echocardiography.   RECOMMENDATIONS:  1 - I would repeat cardiac enzymes and repeat the EKG.  We  will monitor him over the first 24 hours.  I would be reluctant to use  antithrombins and try to keep the patient up and mobilized slightly.  Further evaluation with regard to ischemia evaluation will then be decided  based upon the enzymes.  We make elect for an adenosine Cardiolite study.  I  will discuss with his primary care physician what his potential risks are  with regard to Coumadin anticoagulation.  If his bleed occurred with an INR  of 7 with paroxysmal atrial fibrillation and his age, he is at some  increased risk for cerebrovascular events and some consideration should be  given to resuming this. We will follow the patient with you and I will be in  contact with you regarding further plans.       TDS/MEDQ  D:  06/08/2005  T:  06/08/2005  Job:  [id number]   cc:   Wilson Singer, M.D.  [address]  [place]  [place] [address]  Fax: [phone]

## 2011-05-08 NOTE — Op Note (Signed)
[place]  Patient:    [name] [name], [name] [name] Visit Number: [id number] MRN: [id number]          Service Type: SUR Location: [id number] Attending Physician:  Gerald Dexter Proc. Date: 08/08/01 Adm. Date:  [id number]                             Operative Report  PREOPERATIVE DIAGNOSIS:  Spondylolisthesis, L5-S1, with secondary stenosis.  POSTOPERATIVE DIAGNOSIS:  Spondylolisthesis, L5-S1, with secondary stenosis.  PROCEDURES: 1. Bilateral L5-S1 decompressive laminectomy, followed by Tangent posterior    lumbar interbody fusion. 2. Microdissection of the L5, S1 nerve roots bilaterally and L5-S1 disk    bilaterally.  SURGEON:  Reinaldo Meeker, M.D.  ASSISTANT:  Julio Sicks, M.D.  DESCRIPTION OF PROCEDURE:  After being placed in the prone position, the patients back was sterilely prepped and draped in the usual sterile fashion. Localizing fluoroscopy was used prior to the incision to identify the appropriate level.  A midline incision made above the spinous processes of L5 and S1 as well as L4.  Using the Bovie cutting current, the incision was carried down to the spinous processes.  Subperiosteal dissection was then carried out bilaterally on the spinous processes and laminae, and the VersaTrac self-retaining retractor was placed for exposure.  Fluoroscopy showed approach to the appropriate levels.  The spinous processes at L4 and L5 and S1 were removed.  Laminectomy was then performed with the Leksell rongeur and Kerrison punches by removing the entire L5 lamina and the superior one-third of the S1 segment.  A generous lateral decompression was carried out until the L5 and S1 nerve roots could easily be identified.  The microscope was draped, brought into the field, and used for the remainder of the case. Using microdissection technique, the lateral aspect of the thecal sac on one side and then the other were identified and followed down  to the lumbar disk, which was then incised with a 15 blade, and a thorough disk space clean-out was carried out.  Distractors were used to help gain access into the disk space so a thorough disk space clean-out could be carried out.  When the disk was well cleaned out under the microscope, it was removed and fluoroscopy was brought back into the case.  Under fluoroscopic guidance on the patients left side, a Tangent plug was placed.  Fluoroscopic guidance was used for cutting the disk and making the channel with the chisel as well as placing the Tangent bone into good position.  This was done bilaterally, and fluoroscopy showed the plugs to be in excellent position.  At this point Dr. Jordan Likes assumed the care of the case and placed pedicle screw fixation and did a posterolateral fusion bilaterally.  When this was completed, the wound was closed using interrupted Vicryl on the muscle, fascia, subcutaneous, and subcuticular tissues, and Dermabond and Steri-Strips on the skin.  A Hemovac drain was left in the epidural space and brought out through a separate stab wound incision. DD:  08/08/01 TD:  08/09/01 Job: [id number]

## 2011-05-08 NOTE — Discharge Summary (Signed)
NAME:  [name] [name], [name] [name]                          ACCOUNT NO.:  [id number]   MEDICAL RECORD NO.:  [id number]                   PATIENT TYPE:  INP   LOCATION:  3740                                 FACILITY:  [facility]   PHYSICIAN:  Wilson Singer, M.D.             DATE OF BIRTH:  1926-12-08   DATE OF ADMISSION:  02/14/2004  DATE OF DISCHARGE:  02/24/2004                                 DISCHARGE SUMMARY   FINAL DISCHARGE DIAGNOSES:  1. Right-sided pneumonia.  2. Atrial fibrillation secondary to pneumonia.  3. Hypertension.  4. Hypercholesterolemia.  5. Retroperitoneal hematoma, rectus sheath hematoma.  6. Antibiotic rash.   CONDITION ON DISCHARGE:  Stable.   DISCHARGE MEDICATIONS:  Will continue with home medications.   HISTORY:  This very pleasant 77 year old man was admitted with a three to  four-day history of nonproductive cough, fever, and dyspnea.  Please see  initial History and Physical examination for initial evaluation.   HOSPITAL PROGRESS:  On admission, he was hypotensive, and his blood count  showed a white count of 10.4 with Dohle's bodies present.  Clinically, he  possibly had influenza, but the flu test was negative.  He also was in  atrial fibrillation at the time of admission.  He was admitted to the floor,  and serial cardiac enzymes were negative as was a BNP.  He as given  intravenous fluids and also intravenous antibiotics.  He was started on a  Cardizem drip to control his atrial fibrillation, but this made him  hypotensive, and so he was switched to oral medications.   On February 16, 2004, blood cultures had been negative, and he was afebrile.  He was still in atrial fibrillation, and his blood pressure was stable.  He  had bronchial breathing on the right side of his chest.  He was started on  Lovenox subcutaneously, and his intravenous fluids were reduced.   He did develop some episodes of confusion, but this was probably related to  his pneumonia and  also being in the hospital.  He was switched from his  antibiotic intravenously to oral Avelox.  The sputum culture was positive  for streptococcal pneumonia, and, therefore, he was switched to Ceftin as  the antibiotic of choice.  He was started on Coumadin as well as Lovenox for  atrial fibrillation because this was persistent.   Unfortunately on February 19, 2004, he developed abdominal discomfort.  An  abdominal x-ray did not reveal any major problems, but since he continued to  have abdominal discomfort, further examination showed that he possibly had a  tender mass and a firm mass in the right side of his abdomen.  Also, his  hemoglobin had decreased to 9.6.  Together with the above findings, it was  felt that he clinically had a hematoma.  A CT scan of the abdomen was  ordered, and this indeed did show several hematomas.  Anticoagulation,  therefore,  was immediately discontinued and reversed with vitamin K.  His  abdominal pain did improve with this, and he was given blood because of his  hemoglobin dropping down to 9.0.  His INR was 7.0 at this time from before  his reversal of Coumadin.   On February 23, 2004, he felt better.  His bowels had opened finally, and his  pulse oximetry on room air was 90%.  The mass felt in the right abdomen was  still present but appeared to be softer.  His hemoglobin was 11.2 post 2  units of blood transfusion.  He also started to develop a macular skin rash  on his trunk, and it was felt that this probably was due to antibiotics.   On February 24, 2004, after he had been in sinus rhythm for a couple of days, he  did have an episode of nonsustained ventricular tachycardia which was  asymptomatic.  He was hemodynamically stable.  Pulse oximetry on room air  was 90%.  His hemoglobin was 12.5, and he was in a good and stable condition  to be discharged home.  He had finished a good course of antibiotics, and he  will be seen in the office for followup.                                                 Wilson Singer, M.D.    NCG/MEDQ  D:  04/10/2004  T:  04/12/2004  Job:  [id number]
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
## 2011-05-08 NOTE — Letter (Signed)
January 11, 2007    Wilson Singer, M.D.  [address], [address] [place], [place] [address]   RE:  [name] [name], [name] [name]  MRN:  [id number]  /  DOB:  Feb 11, 1926   Dear Dr. Karilyn Cota:   It was a pleasure to see your nice patient, [name] [name], for followup  on January 11, 2007.   As you know, he has a long history of paroxysmal atrial fibrillation,  has been on Coumadin as of August 10 and then subsequently on the RELY  study drug.   When we saw him in May 2007 he was in normal sinus rhythm.  However,  today he is in atrial fibrillation with controlled ventricular rate.   The patient had no symptoms.  His abdominal ultrasound revealed no  aneurysm and most recent adenosine Myoview in June 2006 was negative.   Medications in addition to the RELY study drug include Toprol-XL 100,  hydrochlorothiazide 12.5, Protonix and multivitamin.  The patient states  that his Lipitor was recently discontinued.   Blood pressure 106/78, pulse 80 in atrial fibrillation.  General  appearance is obese.  JVP is not elevated.  Carotid pulses are palpable  without bruits.  Lungs are clear.  Cardiac exam unremarkable except for  the atrial fibrillation.  Abdominal exam unremarkable.  Extremities show  no edema.  EKG reveals atrial fibrillation with controlled ventricular  rate.   Diagnoses as above.  I have suggested the patient continue on the same  therapy.  I would appreciate your notes concerning the recent lab work  and his Lipitor.  I will look forward to seeing him back again in 4  months or any other time you so desire.  Thanks for the opportunity to  share in this nice gentleman's care.    Sincerely,      E. Graceann Congress, MD, [facility]  Electronically Signed    EJL/MedQ  DD: 01/11/2007  DT: 01/11/2007  Job #: [id number]

## 2011-05-08 NOTE — Procedures (Signed)
[facility]  Patient:    [name] [name], [name] [name]                     MRN: [id number] Proc. Date: 10/14/00 Adm. Date:  [id number] Attending:  Thyra Breed CC:         [name] [name], M.D.   Procedure Report  PREOPERATIVE DIAGNOSIS:  Lumbar spondylosis with neuroforaminal stenosis at multiple levels.  POSTOPERATIVE DIAGNOSIS:  Lumbar spondylosis with neuroforaminal stenosis at multiple levels.  PROCEDURE:  Lumbar epidural steroid injection.  SURGEON:  Thyra Breed, M.D.  INTERVAL HISTORY:  The patient notes ongoing improvement.  He is in the process of getting [name] [name] book on the back, but is on back order.  PHYSICAL EXAMINATION:  Blood pressure is 159/95, heart rate 78, respiratory rate 12, and O2 saturations are 97%.  Pain level is 3 out of 10, and temperature is 97.8.  His back showed good healing from his previous injection site.  DESCRIPTION OF PROCEDURE:  After informed consent was obtained, the patient was placed in the sitting position and monitored.  His back was prepped with Betadine x 3.  The skin level was raised at the L4-5 interspace with 1% lidocaine.  A 20-gauge Tuohy needle was introduced into the lumbar epidural space with loss of resistance to preservative-free normal saline.  There was no CSF nor blood.  One-hundred and twenty milligrams of Medrol and 10 ml of preservative-free normal saline was gently injected.  The needle was flushed with preservative-free normal saline and removed intact.  POSTPROCEDURE CONDITION:  Stable.  DISCHARGE INSTRUCTIONS: 1. Resume previous diet. 2. Limitation and activities per instruction sheet. 3. Continue on current medications. 4. The patient plans to follow up with Dr. Anne [name].  He did advise me that    Dr. Anne [name] has recommended that he continue with chiropractic treatment,    and strongly endorse this.  He sees Dr. Glendale [name] for this. DD:  10/14/00 TD:  10/14/00 Job:  [id number]

## 2011-05-08 NOTE — H&P (Signed)
NAME:  [name] [name], [name] [name]                          ACCOUNT NO.:  [id number]   MEDICAL RECORD NO.:  [id number]                   PATIENT TYPE:  INP   LOCATION:  3740                                 FACILITY:  [facility]   PHYSICIAN:  Wilson Singer, M.D.             DATE OF BIRTH:  02/21/1926   DATE OF ADMISSION:  02/14/2004  DATE OF DISCHARGE:                                HISTORY & PHYSICAL   HISTORY:  This is a 77 year old man who has a background history of  hypertension and hypercholesterolemia who presents with a three to four day  history of nonproductive cough, fever and dyspnea.  When he presented to the  emergency room today because he was weak, he was found to have a fever of  103 degrees Fahrenheit.  He denies any hemoptysis.  He is an Corporate investment banker,  having quit smoking approximately 16 years ago.   PAST MEDICAL HISTORY:  1. Tonsillectomy at the age of 24.  2. TURP and urethral dilation in 1992.  3. Back surgery in August 2002.  4. Right cataract surgery October 2003.  5. He has background history of hypertension, hyperlipidemia and     gastroesophageal reflux disease.   MEDICATIONS:  1. Hydrochlorothiazide 12.5 mg q. day.  2. Nexium 40 mg q. day.  3. Aspirin 81 mg q. day.  4. Lipitor 10 mg q. day.  5. Viagra as needed.   ALLERGIES:  SULFA.   HEALTH MAINTENANCE:  He did have a pneumococcal vaccine in 1999.   SOCIAL HISTORY:  He has been married for approximately 50 years.  He quit  smoking cigarettes 16 years ago, as mentioned above.  He occasionally drinks  alcohol.  He is retired and used to be a Counselling psychologist.   FAMILY HISTORY:  Father died at 56 with sudden death, probably due to  myocardial infarction.  Mother died at 20 with a stroke.  One brother died  also with a stroke and another brother, who is now 24, also had a stroke.  He has a 44 year old son and a 74 year old daughter in good health.   REVIEW OF SYMPTOMS:  There are no other symptoms referable to  the  constitutional, ENT, cardiovascular, respiratory, gastrointestinal,  genitourinary, musculoskeletal, neurologic, dermatologic, endocrine,  psychiatric systems.   PHYSICAL EXAMINATION:  GENERAL:  He looks sick.  He is not clinically toxic,  but he does look clinically dehydrated.  VITAL SIGNS:  Blood pressure is 70/50.  His peripheries are warm.  He is in  atrial fibrillation now with a ventricular rate of 110.  This is after a  bolus of Cardizem 10 mg IV.  RESPIRATORY:  Crackles in the right mid and lower lungs and also some  crackles in the left upper lobe.  There is no bronchial breathing or any  wheezing.  ABDOMEN:  Soft and nontender with no hepatosplenomegaly.  NEUROLOGIC:  He is alert and oriented with no  focal neurologic signs.   INVESTIGATIONS:  White blood cell count 10.4, hemoglobin 15.6, platelets  160.  Archie Balboa bodies are present and seen.  Sodium 130, potassium 3.5, BUN 27,  creatinine 95, pH 7.464.  Chest x-ray shows a right lung infiltrate,  possibility of nodules reported, but I think these probably represent  infiltrates of pneumonia.  However, if these do not clear we will need to  investigate this further.  He has already been given Rocephin and Zithromax  in the emergency room.  He has been given intravenous fluids also with  bolus.   IMPRESSION AND PLAN:  1. Right pneumonia - We will keep him on intravenous antibiotics and also     give him intravenous fluids in bolus as necessary.  2. Atrial fibrillation - I am pretty sure that this is secondary to his     pneumonia, but we will rule out the myocardial infarction.  His atrial     fibrillation should resolve once his pneumonia improves.  3. History of hypertension - We will hold his antihypertensive medications     as he is hypotensive.  4. Hypercholesterolemia - We will hold off his Lipitor as I saw a report of     myoglobin which was elevated on the lab work done in the emergency room.     We will follow  this.  Further recommendations will depend on the     patient's progress.                                                Wilson Singer, M.D.    Toy Baker  D:  02/14/2004  T:  02/14/2004  Job:  [id number]

## 2011-05-08 NOTE — H&P (Signed)
NAME:  [name] [name], [name] [name]              ACCOUNT NO.:  [id number]   MEDICAL RECORD NO.:  [id number]          PATIENT TYPE:  INP   LOCATION:  3733                         FACILITY:  [facility]   PHYSICIAN:  Lonia Blood, M.D.      DATE OF BIRTH:  [birth year]   DATE OF ADMISSION:  06/08/2005  DATE OF DISCHARGE:                                HISTORY & PHYSICAL   PRIMARY CARE PHYSICIAN:  Dr. Lilly Cove   PRESENTING COMPLAINT:  Chest pain.   HISTORY OF PRESENT ILLNESS:  The patient is a 77 year old with history of  hypertension, dyslipidemia, obesity, and GERD, also history of atrial  fibrillation who came in secondary to severe retrosternal chest pain rated  as 8/10 radiating to his back.  Patient has had degenerative disk disease  with back surgery before and also has some GERD.  However, his symptoms  currently seem to be more severe.  He is taking his regular medications with  no relief.  He denied any diaphoresis; however, he was nauseated.  He denied  any radiation to the arm or the jaw.   PAST MEDICAL HISTORY:  1.  Extensive, including history of pneumonia last year right-sided      pneumonia.  2.  Atrial fibrillation.  3.  Hypertension.  4.  Dyslipidemia.  5.  History of retroperitoneal hematoma secondary to Coumadin use.  6.  History of previous antibiotic rashes.  7.  History of tonsillectomy at the age of 38.  8.  History of TURP and urethral dilatation in 1992.  9.  History of back surgery in August of 2002.  10. Right Cataract in October 2003.   ALLERGIES:  Patient is allergic to SULFA.   SOCIAL HISTORY:  Patient is married and lives here in [place] with his  wife of more than 50 years.  Patient has previously smoked, but quit tobacco  smoking over 17 years ago.  He drinks socially.  He worked as a Chief Strategy Officer, but currently retired.   FAMILY HISTORY:  His father and mother both died in their late 69s and 23s.  They both had myocardial infarction, but in their 11s.   His mother also had  stroke.  Two of his brothers also had stroke one of which died.  Patient has  two children, a son and a daughter all in their 59s and healthy.   REVIEW OF SYSTEMS:  A 10-point review of system is essentially normal and  the rest as per HPI.   PHYSICAL EXAMINATION:  VITAL SIGNS:  Temperature 98.6, blood pressure  initially 154/90, pulse of 101, respiratory rate 20, saturations 94% on room  air.  GENERAL:  Patient is alert, oriented, very pleasant man in no acute  distress.  HEENT:  PERRL.  EOMI.  NECK:  Supple.  No JVD.  No lymphadenopathy.  RESPIRATORY:  Good air entry bilaterally.  No wheezes or rales.  CARDIOVASCULAR:  Irregularly irregular rhythm with some tachycardia.  ABDOMEN:  Obese, full, nontender with positive bowel sounds.  EXTREMITIES:  No edema, cyanosis, or clubbing.   LABORATORIES:  White count 10.1, hemoglobin 14.9,  platelet count 209 with  left shift, ANC 9.5.  Electrolytes are currently pending.  His UA is  negative.  D-dimer 0.33.  Initial cardiac enzymes point of care also  negative.  His chest x-ray showed no active cardiopulmonary disease.  Chest  CT angiogram showed negative for aortic dissection or aneurysm.  Coronary  and aortic arch atheromatous calcifications were negative for acute  abnormality.  EKG showed an atrial fibrillation with a rate of 111  indicating RVR.  Normal interval.  No ST-T wave changes.   ASSESSMENT:  This is a 77 year old gentleman with history of atrial  fibrillation, hypertension, and dyslipidemia presenting with chest pain  radiating to the back.  Patient has a wide range of differential for his  symptoms including myocardial infarction, aortic dissection, esophageal tear  which is less likely, pulmonary embolism, and his gastroesophageal reflux  disease.  So far the chest CT and chest x-ray have essentially ruled out  dissection or pulmonary embolism.  Patient could also be having symptoms  from his atrial  fibrillation.  He has had previous retroperitoneal hematoma  last year; however, I do not expect that to be causing his symptoms.   PLAN:  1.  Chest pain.  Will admit the patient to rule out MI.  Will also check 2-D      echocardiogram on the patient.  I will put him on some nitroglycerin,      aspirin, as well as possibly beta blockers if his symptoms continue.  I      will consider cardiac consult because of his atrial fibrillation.  2.  Atrial fibrillation.  This is chronic in the patient.  He was placed on      Lovenox and Coumadin in February 2005.  However, he had retroperitoneal      hematoma and that was discontinued.  Patient is at risk for stroke with      at least two of his brothers having strokes as well as his mother dying      from that as well.  I am therefore more inclined to having this      patient's atrial fibrillation either converted or the other since we      cannot give him Coumadin or at least ablate him if any abnormal area in      his myocardium.  I will therefore consult cardiology for further input.      In the meantime I will start him on some Cardizem for rate control.  3.  Hypertension.  I will keep patient on his home medications which include      hydrochlorothiazide at this point.  I will also go with nitroglycerin      and possibly a beta blocker to help control his blood pressure.  4.  Dyslipidemia.  Patient is also taking Lipitor for his dyslipidemia.      Patient is not sure of the doses of his medication but he takes four      different dose of medicines.  Will get records of his medication from      Dr. Karilyn Cota.  I will check his fasting lipid panel now and if needed      will adjust his Lipitor dose.  5.  Degenerative disk disease.  This may be the cause for patient's back      pain.  He has back surgery as indicated above.  I will try and control      his back pain possibly with IV morphine  and probably NSAIDs. 6.  GERD.  Patient apparently was  on Nexium for his GERD but his insurance      company did not keep up with that.  He needs to be on a PPI for his GERD      which may be partially responsible for his current symptoms.  I will      therefore go ahead and put him on some Protonix daily.   DISCHARGE MEDICATIONS:  1.  Patient is taking Lipitor.  2.  He is taking hydrochlorothiazide 12.5 mg daily.  3.  He is taking an antacid but he is not sure.  4.  Recently, for his bladder patient has been having urinary problem and      was started on medication that he is also not aware of.  Will call Dr.      Patty Sermons office this morning and get his medication list and restart      him on his other home medicines.       LG/MEDQ  D:  06/08/2005  T:  06/08/2005  Job:  [id number]

## 2011-05-29 ENCOUNTER — Other Ambulatory Visit: Payer: Self-pay | Admitting: Cardiovascular Disease

## 2011-06-30 ENCOUNTER — Encounter (INDEPENDENT_AMBULATORY_CARE_PROVIDER_SITE_OTHER): Payer: Medicare Other

## 2011-06-30 DIAGNOSIS — R0989 Other specified symptoms and signs involving the circulatory and respiratory systems: Secondary | ICD-10-CM

## 2011-08-17 ENCOUNTER — Encounter: Payer: Self-pay | Admitting: Cardiovascular Disease

## 2011-08-21 ENCOUNTER — Encounter: Payer: Self-pay | Admitting: Cardiovascular Disease

## 2011-09-03 ENCOUNTER — Encounter: Payer: Self-pay | Admitting: Cardiovascular Disease

## 2011-09-03 ENCOUNTER — Ambulatory Visit (INDEPENDENT_AMBULATORY_CARE_PROVIDER_SITE_OTHER): Payer: Medicare Other | Admitting: Cardiovascular Disease

## 2011-09-03 DIAGNOSIS — I10 Essential (primary) hypertension: Secondary | ICD-10-CM

## 2011-09-03 DIAGNOSIS — I4891 Unspecified atrial fibrillation: Secondary | ICD-10-CM

## 2011-09-03 NOTE — Progress Notes (Signed)
HPI:  This is an 77 year old male presents for followup evaluation. He has hypertension, chronic atrial fibrillation, and obesity. He has been chronically anticoagulated per the Engage trial protocol. He denies chest pain or dyspnea with normal activities. The patient is very sedentary. He is engaged in no physical activity. He denies orthopnea, PND, palpitations, or edema.  He was noted to have low blood pressure when he checked in today and he reports episodic weakness and dizziness. He has had no syncope. He does not check his blood pressure regularly at home.  Outpatient Encounter Prescriptions as of 09/03/2011  Medication Sig Dispense Refill  . Cholecalciferol (VITAMIN D) 1000 UNITS capsule Take 1,000 Units by mouth daily.        . digoxin (LANOXIN) 0.125 MG tablet TAKE 1 TABLET BY MOUTH EVERY DAY  30 tablet  6  . metoprolol (TOPROL-XL) 50 MG 24 hr tablet Take 50 mg by mouth daily.        . Multiple Vitamin (MULTIVITAMIN) tablet Take 1 tablet by mouth daily.        . niacin (NIASPAN) 500 MG CR tablet Take 500 mg by mouth at bedtime.        . NON FORMULARY Engage AF study drug       . pantoprazole (PROTONIX) 40 MG tablet Take 40 mg by mouth daily.        . Tamsulosin HCl (FLOMAX) 0.4 MG CAPS Take 0.8 mg by mouth daily.        . vitamin C (ASCORBIC ACID) 500 MG tablet Take 500 mg by mouth daily.        Marland Kitchen DISCONTD: hydrochlorothiazide (,MICROZIDE/HYDRODIURIL,) 12.5 MG capsule Take 12.5 mg by mouth daily.        Marland Kitchen DISCONTD: Fesoterodine Fumarate (TOVIAZ) 8 MG TB24 Take 1 tablet (8 mg total) by mouth daily.        Allergies  Allergen Reactions  . Sulfonamide Derivatives     No past medical history on file.  ROS: Negative except as per HPI  BP 92/60  Pulse 89  Ht 5\' 6"  (1.676 m)  Wt 210 lb 6.4 oz (95.437 kg)  BMI 33.96 kg/m2  PHYSICAL EXAM: Pt is alert and oriented, obese male in NAD HEENT: normal Neck: JVP - normal, carotids 2+= without bruits Lungs: CTA bilaterally CV: RRR  without murmur or gallop Abd: soft, NT, Positive BS, no hepatomegaly Ext: no C/C/E, distal pulses intact and equal Skin: warm/dry no rash  EKG:  Atrial fibrillation 89 beats per minute, otherwise within normal limits.  ASSESSMENT AND PLAN:

## 2011-09-03 NOTE — Assessment & Plan Note (Signed)
The patient's blood pressure is too low and he is having some symptoms related to this. Will hold hydrochlorothiazide. If he develops leg swelling he will call and we will adjust his medications. He has not had problems with edema in the past.

## 2011-09-03 NOTE — Assessment & Plan Note (Signed)
The patient is stable. He continues with chronic anticoagulation. His heart rate control is adequate.

## 2011-09-03 NOTE — Patient Instructions (Signed)
Your physician wants you to follow-up in:  6 months.  You will receive a reminder letter in the mail two months in advance. If you don't receive a letter, please call our office to schedule the follow-up appointment.  Your physician has recommended you make the following change in your medication:  Stop hydrochlorothiazide.    

## 2011-09-16 ENCOUNTER — Telehealth: Payer: Self-pay | Admitting: Cardiovascular Disease

## 2011-09-16 NOTE — Telephone Encounter (Signed)
Pt wife calling wanting to know if niaspan could have anything to do with pt falling all the time. Please return pt wife call to discuss further.

## 2011-09-16 NOTE — Telephone Encounter (Signed)
I spoke with the pt's wife and she said the pt is falling due to weakness in his legs.  The pt is NOT passing out.  The pt fell at [employer] this weekend and said his legs just gave out.  The pt's wife would like to see if Niaspan is causing the pt's leg weakness.  I made her aware that the pt could stop this medication for a few weeks and see if his symptoms improved.  If the pt's symptoms do not improve then he will restart Niaspan.

## 2011-09-16 NOTE — Telephone Encounter (Signed)
Left message for pts wife to call back. 

## 2011-10-02 ENCOUNTER — Emergency Department (HOSPITAL_COMMUNITY)
Admission: EM | Admit: 2011-10-02 | Discharge: 2011-10-02 | Disposition: A | Discharge status: Home / Self Care | Payer: Medicare Other | Attending: Emergency Medicine | Admitting: Emergency Medicine

## 2011-10-02 DIAGNOSIS — I4891 Unspecified atrial fibrillation: Secondary | ICD-10-CM | POA: Insufficient documentation

## 2011-10-02 DIAGNOSIS — R339 Retention of urine, unspecified: Secondary | ICD-10-CM | POA: Insufficient documentation

## 2011-10-02 DIAGNOSIS — I10 Essential (primary) hypertension: Secondary | ICD-10-CM | POA: Insufficient documentation

## 2012-01-14 ENCOUNTER — Ambulatory Visit (INDEPENDENT_AMBULATORY_CARE_PROVIDER_SITE_OTHER): Payer: Medicare Other | Admitting: Cardiovascular Disease

## 2012-01-14 ENCOUNTER — Encounter: Payer: Self-pay | Admitting: Cardiovascular Disease

## 2012-01-14 VITALS — BP 112/76 | HR 69 | Ht 67.0 in | Wt 205.0 lb

## 2012-01-14 DIAGNOSIS — I4891 Unspecified atrial fibrillation: Secondary | ICD-10-CM

## 2012-01-14 DIAGNOSIS — I10 Essential (primary) hypertension: Secondary | ICD-10-CM

## 2012-01-14 LAB — BASIC METABOLIC PANEL
Calcium: 9.2 mg/dL (ref 8.4–10.5)
GFR: 52.17 mL/min — ABNORMAL LOW (ref >60.00)
Glucose, Bld: 139 mg/dL — ABNORMAL HIGH (ref 70–99)
Potassium: 3.9 mEq/L (ref 3.5–5.1)
Sodium: 139 mEq/L (ref 135–145)

## 2012-01-14 NOTE — Patient Instructions (Signed)
Your physician recommends that you have lab work today: BMP  You can hold study drug 5 days prior to TURP.  Your physician wants you to follow-up in: 6 MONTHS. You will receive a reminder letter in the mail two months in advance. If you don't receive a letter, please call our office to schedule the follow-up appointment.

## 2012-01-19 NOTE — Assessment & Plan Note (Signed)
Blood pressure remains well controlled.

## 2012-01-19 NOTE — Assessment & Plan Note (Signed)
We discussed the pros and cons of warfarin versus either xarelto or pradaxa. It is very clear that his compliance will be better with a newer agent. I have recommended Xarelto 15 mg daily in light of his mildly reduced creatinine clearance. He is at acceptable risk to move forward with noncardiac surgery without further testing. The patient's functional capacity is very poor but this is multifactorial and cannot be further optimized prior to surgery. He does not have any signs of active ischemia or angina. He can hold his anticoagulant therapy for 5 days prior to surgery and may resume after surgery when it is considered safe.

## 2012-01-19 NOTE — Progress Notes (Signed)
HPI:  The patient is an 78 year old gentleman presenting for followup evaluation. He has permanent atrial fibrillation and hypertension. The patient has been scheduled for TURP to treat severe BPH requiring a Foley catheter. He presents today for preoperative cardiac evaluation. The patient has been chronically anticoagulated as part of the Engage trial. This is coming to an end and we need to make a decision whether to place him on warfarin for a newer antithrombotic agent.  The patient denies chest pain or pressure. He has stable dyspnea with exertion. He denies orthopnea, PND, lightheadedness, or syncope. He remains very sedentary. He has a strong desire to not take open label warfarin as he does not like to come in frequently for blood draws.  Outpatient Encounter Prescriptions as of 01/14/2012  Medication Sig Dispense Refill  . Cholecalciferol (VITAMIN D) 1000 UNITS capsule Take 1,000 Units by mouth daily.        . digoxin (LANOXIN) 0.125 MG tablet TAKE 1 TABLET BY MOUTH EVERY DAY  30 tablet  6  . metoprolol (TOPROL-XL) 50 MG 24 hr tablet Take 50 mg by mouth daily.       . Multiple Vitamin (MULTIVITAMIN) tablet Take 1 tablet by mouth daily.        . niacin (NIASPAN) 500 MG CR tablet Take 500 mg by mouth at bedtime.        . NON FORMULARY Engage AF study drug       . pantoprazole (PROTONIX) 40 MG tablet Take 40 mg by mouth daily.        . vitamin C (ASCORBIC ACID) 500 MG tablet Take 500 mg by mouth daily.        Marland Kitchen DISCONTD: Tamsulosin HCl (FLOMAX) 0.4 MG CAPS Take 0.8 mg by mouth daily.          Allergies  Allergen Reactions  . Sulfonamide Derivatives     Past Medical History  Diagnosis Date  . Hypertension   . Hyperlipidemia   . Chronic atrial fibrillation     ROS: Negative except as per HPI  BP 112/76  Pulse 69  Ht 5\' 7"  (1.702 m)  Wt 92.987 kg (205 lb)  BMI 32.11 kg/m2  PHYSICAL EXAM: Pt is alert and oriented, elderly, chronically ill-appearing male in NAD HEENT:  normal Neck: JVP - normal, carotids 2+= without bruits Lungs: CTA bilaterally CV: irregularly irregular without murmur or gallop Abd: soft, NT, Positive BS, no hepatomegaly Ext: no C/C/E, distal pulses intact and equal Skin: bilateral stasis changes  EKG:  Atrial fibrillation with incomplete right bundle branch block, heart rate 69 beats per minute.  ASSESSMENT AND PLAN:

## 2012-01-20 ENCOUNTER — Other Ambulatory Visit: Payer: Self-pay | Admitting: Urology

## 2012-01-21 ENCOUNTER — Emergency Department (HOSPITAL_COMMUNITY)
Admission: EM | Admit: 2012-01-21 | Discharge: 2012-01-22 | Disposition: A | Discharge status: Home / Self Care | Payer: Medicare Other | Attending: Emergency Medicine | Admitting: Emergency Medicine

## 2012-01-21 ENCOUNTER — Other Ambulatory Visit: Payer: Self-pay

## 2012-01-21 ENCOUNTER — Encounter (HOSPITAL_COMMUNITY): Payer: Self-pay | Admitting: Emergency Medicine

## 2012-01-21 DIAGNOSIS — I4891 Unspecified atrial fibrillation: Secondary | ICD-10-CM | POA: Insufficient documentation

## 2012-01-21 DIAGNOSIS — E785 Hyperlipidemia, unspecified: Secondary | ICD-10-CM | POA: Insufficient documentation

## 2012-01-21 DIAGNOSIS — N390 Urinary tract infection, site not specified: Secondary | ICD-10-CM | POA: Insufficient documentation

## 2012-01-21 DIAGNOSIS — I10 Essential (primary) hypertension: Secondary | ICD-10-CM | POA: Insufficient documentation

## 2012-01-21 DIAGNOSIS — R339 Retention of urine, unspecified: Secondary | ICD-10-CM | POA: Insufficient documentation

## 2012-01-21 DIAGNOSIS — R300 Dysuria: Secondary | ICD-10-CM | POA: Insufficient documentation

## 2012-01-21 LAB — CBC
HCT: 43.6 % (ref 39.0–52.0)
RDW: 13.2 % (ref 11.5–15.5)
WBC: 12.4 10*3/uL — ABNORMAL HIGH (ref 4.0–10.5)

## 2012-01-21 LAB — DIFFERENTIAL
Basophils Absolute: 0 10*3/uL (ref 0.0–0.1)
Lymphocytes Relative: 15 % (ref 12–46)
Monocytes Absolute: 0.8 10*3/uL (ref 0.1–1.0)
Neutro Abs: 9.2 10*3/uL — ABNORMAL HIGH (ref 1.7–7.7)
Neutrophils Relative %: 74 % (ref 43–77)

## 2012-01-21 MED ORDER — RIVAROXABAN 15 MG PO TABS: 15.0000 mg | ORAL_TABLET | Freq: Every day | ORAL | Status: DC

## 2012-01-21 NOTE — ED Notes (Signed)
74fr foley was inserted using sterile technique.  Once in place, pt had no complaints and no gross blood in immediate urine return.  Specimen was dark, opaque, yellow with no obvious smell.  Bag labeled, specimen sent and pt resting comfortably at this time

## 2012-01-21 NOTE — Progress Notes (Signed)
Pt will be stopping the Engage study drug on 01/26/12.  Dr Excell Seltzer would like the pt to start Xarelto 15mg  daily.  Rx sent to pharmacy.  Maureen Ralphs will be checking the pt's INR on 01/26/12 and will have the pt start Xarelto if less than 2.0.

## 2012-01-21 NOTE — ED Provider Notes (Signed)
History     CSN: [id number]  Arrival date & time 01/21/12  2234   First MD Initiated Contact with Patient 01/21/12 2307      Chief Complaint  Patient presents with  . Dysuria    (Consider location/radiation/quality/duration/timing/severity/associated sxs/prior treatment) Patient is a 78 y.o. male presenting with dysuria. The history is provided by the patient. No language interpreter was used.  Dysuria  This is a recurrent problem. The current episode started 3 to 5 hours ago. The problem occurs intermittently. The problem has not changed since onset.The quality of the pain is described as burning. The pain is at a severity of 5/10. The pain is moderate. There has been no fever. There is no history of pyelonephritis. Pertinent negatives include no chills, no sweats, no nausea, no vomiting, no discharge and no frequency. He has tried nothing for the symptoms. His past medical history is significant for urological procedure and catheterization.  having discomfort with an indwelling foley and then pulled his foley and now can't urinate.  Foley was supposed to stay in place until an upcoming urological procedure.   Past Medical History  Diagnosis Date  . Hypertension   . Hyperlipidemia   . Chronic atrial fibrillation     History reviewed. No pertinent past surgical history.  No family history on file.  History  Substance Use Topics  . Smoking status: Former Smoker    Types: Cigarettes  . Smokeless tobacco: Never Used  . Alcohol Use: No      Review of Systems  Constitutional: Negative for chills.  HENT: Negative.   Eyes: Negative.   Respiratory: Negative.   Cardiovascular: Negative.   Gastrointestinal: Negative for nausea and vomiting.  Genitourinary: Positive for dysuria and difficulty urinating. Negative for frequency.  Musculoskeletal: Negative.   Skin: Negative.   Neurological: Negative.   Hematological: Negative.   Psychiatric/Behavioral: Negative.      Allergies  Sulfa antibiotics  Home Medications   Current Outpatient Rx  Name Route Sig Dispense Refill  . VITAMIN D 1000 UNITS PO CAPS Oral Take 1,000 Units by mouth at bedtime.     Marland Kitchen DIGOXIN 0.125 MG PO TABS  TAKE 1 TABLET BY MOUTH EVERY DAY 30 tablet 6  . METOPROLOL SUCCINATE ER 50 MG PO TB24 Oral Take 50 mg by mouth at bedtime.     . ADULT MULTIVITAMIN W/MINERALS CH Oral Take 1 tablet by mouth at bedtime.    Marland Kitchen NIACIN ER (ANTIHYPERLIPIDEMIC) 500 MG PO TBCR Oral Take 500 mg by mouth at bedtime.      . NON FORMULARY  Engage AF study drug    . PANTOPRAZOLE SODIUM 40 MG PO TBEC Oral Take 40 mg by mouth daily.      Marland Kitchen RIVAROXABAN 15 MG PO TABS Oral Take 1 tablet (15 mg total) by mouth daily. 30 tablet 6  . VITAMIN C 500 MG PO TABS Oral Take 500 mg by mouth at bedtime.       BP 129/84  Pulse 94  Temp(Src) 98.6 F (37 C) (Oral)  Resp 20  SpO2 93%  Physical Exam  Constitutional: He is oriented to person, place, and time. He appears well-developed and well-nourished.  HENT:  Head: Normocephalic.  Eyes: EOM are normal. Pupils are equal, round, and reactive to light.  Neck: Normal range of motion. Neck supple.  Cardiovascular: Normal rate and regular rhythm.   Pulmonary/Chest: Effort normal and breath sounds normal. He has no rales.  Abdominal: Soft. Bowel sounds are normal. He  exhibits no distension. There is no tenderness. There is no rebound.  Genitourinary: Penis normal.       Scant bleeding in underwear  Musculoskeletal: Normal range of motion.  Neurological: He is alert and oriented to person, place, and time.  Skin: Skin is warm and dry.  Psychiatric: He has a normal mood and affect.    ED Course  Procedures (including critical care time)   Labs Reviewed  CBC  DIFFERENTIAL  URINALYSIS, WITH MICROSCOPIC  URINE CULTURE  PROTIME-INR   No results found.   No diagnosis found.    MDM  Patient and wife told to let the study investigator to let them know he is  on CIPRo and check PT INR in case he is on coumadin as his study drug.  I doubt this given the patient's current INR. Call urology to make a follow up given the fact he pulled his foley.   Patient and wife told to return immediately for fevers, chills, nausea, global weakness decreased appetite worsening urinary symptoms or any concerns because he would need to be admitted.  Patient and wife verbalize understanding and agree to follow up      Alizza Sacra Smitty Cords, MD 01/22/12 [phone]

## 2012-01-21 NOTE — ED Notes (Signed)
PT. REPORTS URINARY DISCOMFORT TODAY AND PULLED HIS FOLEY CATHETER OUT THIS EVENING , STATES SLIGHT BLEEDING .

## 2012-01-22 LAB — URINALYSIS, MICROSCOPIC ONLY
Bilirubin Urine: NEGATIVE
Nitrite: NEGATIVE
Specific Gravity, Urine: 1.02 (ref 1.005–1.030)
Urobilinogen, UA: 1 mg/dL (ref 0.0–1.0)
pH: 6 (ref 5.0–8.0)

## 2012-01-22 LAB — POCT I-STAT, CHEM 8
BUN: 14 mg/dL (ref 6–23)
Creatinine, Ser: 1.2 mg/dL (ref 0.50–1.35)
Glucose, Bld: 188 mg/dL — ABNORMAL HIGH (ref 70–99)
Hemoglobin: 16 g/dL (ref 13.0–17.0)
TCO2: 30 mmol/L (ref 0–100)

## 2012-01-22 LAB — PROTIME-INR
INR: 1.1 (ref 0.00–1.49)
Prothrombin Time: 14.4 seconds (ref 11.6–15.2)

## 2012-01-22 MED ORDER — CIPROFLOXACIN HCL 500 MG PO TABS: 500.0000 mg | ORAL_TABLET | Freq: Two times a day (BID) | ORAL | Status: AC

## 2012-01-22 MED ORDER — CIPROFLOXACIN HCL 500 MG PO TABS
500.0000 mg | ORAL_TABLET | Freq: Once | ORAL | Status: AC
  Administered 2012-01-22: 500 mg via ORAL
  Filled 2012-01-22: qty 1

## 2012-01-22 MED ORDER — LIDOCAINE HCL (PF) 1 % IJ SOLN
INTRAMUSCULAR | Status: AC
  Administered 2012-01-22: 01:00:00
  Filled 2012-01-22: qty 5

## 2012-01-22 MED ORDER — CEFTRIAXONE SODIUM 1 G IJ SOLR
1.0000 g | Freq: Once | INTRAMUSCULAR | Status: AC
  Administered 2012-01-22: 1 g via INTRAMUSCULAR
  Filled 2012-01-22: qty 10

## 2012-01-22 NOTE — ED Notes (Signed)
Comfort measures provided for family at bedside    

## 2012-01-22 NOTE — Discharge Instructions (Signed)
Acute Urinary Retention, Male You have been seen by a caregiver today because of your inability to urinate (pass your water). This is a common problem in elderly males. As men age their prostates become larger and block the flow of urine from the bladder. This is usually a problem that has come on gradually. It is often first noticed by having to get up at night to urinate. This is because as the prostate enlarges it is more difficult to empty the bladder completely. Treatment may involve a one time catheterization to empty the bladder. This is putting in a tube to drain your urine. Then you and your personal caregiver can decide at your earliest convenience how to handle this problem in the future. It may also be a problem that may not recur for years. Sometimes this problem can be caused by medications. In this case, all that is often necessary is to discontinue the offending agent. If you are to leave the foley catheter (a long, narrow, hollow tube) in and go home with a drainage system, you will need to discuss the best course of action with your caregiver. While the catheter is in, maintain a good intake of fluids. Keep the drainage bag emptied and lower than your catheter. This is so contaminated (infected) urine will not be flowing back into your bladder. This could lead to a urinary tract infection. Only take over-the-counter or prescription medicines for pain, discomfort, or fever as directed by your caregiver.  SEEK IMMEDIATE MEDICAL CARE IF:  You develop chills, fever, or show signs of generalized illness that occurs prior to seeing your caregiver. Document Released: 03/15/2001 Document Revised: 08/19/2011 Document Reviewed: 11/28/2008 Exit[employer] Patient Information 2012 [employer]. 

## 2012-01-22 NOTE — ED Notes (Signed)
PT ambulated with a baseline gait; pt instructed to follow up with urologist regarding catheter trauma, VSS, A&Ox3; respirations even and unlabored; no signs of distress.

## 2012-01-24 LAB — URINE CULTURE: Colony Count: 45000

## 2012-01-25 NOTE — ED Notes (Signed)
Chart sent to EDP office for review °

## 2012-01-26 NOTE — ED Notes (Signed)
Chart returned from EDP office  rx for  Levaquin 500 mg #7 1 po qd x 7 days needs to be called to pharmacy per Ruby Cola

## 2012-01-27 NOTE — ED Notes (Signed)
Patient does not need follow up. Patient is on Cipro.

## 2012-01-28 ENCOUNTER — Encounter: Payer: Self-pay | Admitting: *Deleted

## 2012-01-28 NOTE — Progress Notes (Signed)
Patient ID: [name] [name], male   DOB: 23-Apr-1926, 78 y.o.   MRN: [id number] Patient's wife called on 01/25/12 to let me know patient had fractured tooth. Patient went to the dentist on 01/25/12. The dentist wants to pull tooth on 01/28/12. Patient was to come out of the Engage Study on 01/26/12 and transition to Xarelto 15 mg qd. I contacted the study physician Dr. Jason Nest with the Engage Study line. He told me to delay the study end date until the patient has tooth extracted. He told me to have the patient hold study drug three days prior to extraction. Patient was to hold starting 01/25/12 and resume study drug after extraction. We will transition patient out the Engage Study on 02/02/12. I discussed plan with wife and she verbalized understanding. Dr. Excell Seltzer made aware also.

## 2012-02-03 ENCOUNTER — Encounter (INDEPENDENT_AMBULATORY_CARE_PROVIDER_SITE_OTHER): Payer: Medicare Other

## 2012-02-03 ENCOUNTER — Encounter: Payer: Self-pay | Admitting: Cardiovascular Disease

## 2012-02-03 DIAGNOSIS — R0989 Other specified symptoms and signs involving the circulatory and respiratory systems: Secondary | ICD-10-CM

## 2012-02-04 ENCOUNTER — Telehealth: Payer: Self-pay | Admitting: *Deleted

## 2012-02-04 ENCOUNTER — Other Ambulatory Visit: Payer: Self-pay

## 2012-02-04 NOTE — Telephone Encounter (Signed)
Patient's wife called me to confirm that patient was to come off Xaretlo 15 mg 5 days prior to upcoming surgery on 02/22/12. I confirmed with Dr. Excell Seltzer and called patient back to let her know. Patient's wife verbalized understanding.

## 2012-02-16 ENCOUNTER — Encounter (HOSPITAL_COMMUNITY): Payer: Self-pay

## 2012-02-17 ENCOUNTER — Encounter (HOSPITAL_COMMUNITY): Payer: Self-pay

## 2012-02-17 ENCOUNTER — Ambulatory Visit (HOSPITAL_COMMUNITY)
Admission: RE | Admit: 2012-02-17 | Discharge: 2012-02-17 | Disposition: A | Discharge status: Home / Self Care | Payer: Medicare Other | Source: Ambulatory Visit | Attending: Urology | Admitting: Urology

## 2012-02-17 ENCOUNTER — Encounter (HOSPITAL_COMMUNITY)
Admission: RE | Admit: 2012-02-17 | Discharge: 2012-02-17 | Disposition: A | Discharge status: Home / Self Care | Payer: Medicare Other | Source: Ambulatory Visit | Attending: Urology | Admitting: Urology

## 2012-02-17 DIAGNOSIS — Z01812 Encounter for preprocedural laboratory examination: Secondary | ICD-10-CM | POA: Insufficient documentation

## 2012-02-17 DIAGNOSIS — Z01818 Encounter for other preprocedural examination: Secondary | ICD-10-CM | POA: Insufficient documentation

## 2012-02-17 DIAGNOSIS — I4891 Unspecified atrial fibrillation: Secondary | ICD-10-CM | POA: Insufficient documentation

## 2012-02-17 DIAGNOSIS — G4730 Sleep apnea, unspecified: Secondary | ICD-10-CM | POA: Insufficient documentation

## 2012-02-17 DIAGNOSIS — N401 Enlarged prostate with lower urinary tract symptoms: Secondary | ICD-10-CM | POA: Insufficient documentation

## 2012-02-17 DIAGNOSIS — R339 Retention of urine, unspecified: Secondary | ICD-10-CM | POA: Insufficient documentation

## 2012-02-17 DIAGNOSIS — N138 Other obstructive and reflux uropathy: Secondary | ICD-10-CM | POA: Insufficient documentation

## 2012-02-17 DIAGNOSIS — I700 Atherosclerosis of aorta: Secondary | ICD-10-CM | POA: Insufficient documentation

## 2012-02-17 DIAGNOSIS — I10 Essential (primary) hypertension: Secondary | ICD-10-CM | POA: Insufficient documentation

## 2012-02-17 HISTORY — DX: Retention of urine, unspecified: R33.9

## 2012-02-17 HISTORY — DX: Sleep apnea, unspecified: G47.30

## 2012-02-17 HISTORY — DX: Unspecified osteoarthritis, unspecified site: M19.90

## 2012-02-17 LAB — BASIC METABOLIC PANEL
BUN: 12 mg/dL (ref 6–23)
Chloride: 102 mEq/L (ref 96–112)
Creatinine, Ser: 1.33 mg/dL (ref 0.50–1.35)
GFR calc Af Amer: 55 mL/min — ABNORMAL LOW (ref >90)
Glucose, Bld: 165 mg/dL — ABNORMAL HIGH (ref 70–99)
Potassium: 4 mEq/L (ref 3.5–5.1)

## 2012-02-17 LAB — CBC
Hemoglobin: 14.5 g/dL (ref 13.0–17.0)
MCH: 32 pg (ref 26.0–34.0)
MCHC: 34.3 g/dL (ref 30.0–36.0)
MCV: 93.4 fL (ref 78.0–100.0)
Platelets: 209 10*3/uL (ref 150–400)
RBC: 4.53 MIL/uL (ref 4.22–5.81)

## 2012-02-17 LAB — SURGICAL PCR SCREEN: MRSA, PCR: NEGATIVE

## 2012-02-17 LAB — PROTIME-INR: INR: 1.03 (ref 0.00–1.49)

## 2012-02-17 NOTE — Pre-Procedure Instructions (Signed)
Dr cooper cardiac clearance note 01-14-12 in epic and on chart ekg 01-14-12 in epic

## 2012-02-17 NOTE — Patient Instructions (Signed)
22 [name] [name]  02/17/2012   Your procedure is scheduled on:  02-22-2012  Report to [facility] at  945 AM.  Call this number if you have problems the morning of surgery: [phone]   Remember:   Do not eat food or drink liquids:After Midnight.  .  Take these medicines the morning of surgery with A SIP OF WATER: no meds to take   Do not wear jewelry or make up.  Do not wear lotions, powders, or perfumes.Do not wear deodorant.    Do not bring valuables to the hospital.  Contacts, dentures or bridgework may not be worn into surgery.  Leave suitcase in the car. After surgery it may be brought to your room.  For patients admitted to the hospital, checkout time is 11:00 AM the day of discharge.     Special Instructions: CHG Shower Use Special Wash: 1/2 bottle night before surgery and 1/2 bottle morning of surgery.neck down avoid private area   Please read over the following fact sheets that you were given: MRSA Information  [facility] pre op nurse phone number [phone], call if needed

## 2012-02-19 NOTE — H&P (Signed)
Chief Complaint  Urinary retention  Benign prostatic hypertrophy   History of Present Illness  78 year old male. Presents for TURP.  History of urinary retention and has failed many voiding trials.  Urodynamics in November 2012 showed obstruction.  He has required indwelling catheter.  We have discussed the risks, benefits, alternatives and likelihood of achieving his goals.  He was cleared for surgery and to stop his anticoagulant (xeralto 15mg ) by Dr. Tonny Bollman 5 days prior to his TURP and to resume when considered safe.    He traumatically removed his catheter and presented to the ER 01/21/12 where a urine culture showed 45,000 enterococcus sensitive to nitrofurantoin, ampicillin, levaquin, and vancomycin.    Past Medical History Problems  1. History of  Arthritis V13.4 2. History of  Atrial Fibrillation 427.31 3. History of  Disturbance Of Gait 781.2 4. History of  Esophageal Reflux 530.81 5. History of  Hypercholesterolemia 272.0 6. History of  Hypertension 401.9  Surgical History Problems  1. History of  Back Surgery 2. History of  Tonsillectomy  Current Meds 1. Ciprofloxacin HCl 250 MG Oral Tablet; TAKE 1 TABLET EVERY 12 HOURS DAILY; Therapy:  08Jan2013 to (Evaluate:18Jan2013)  Requested for: 08Jan2013; Last Rx:08Jan2013 2. Ciprofloxacin HCl 250 MG Oral Tablet; TAKE 1 TABLET EVERY 12 HOURS DAILY; Therapy:  09Jan2013 to (Evaluate:19Jan2013)  Requested for: 09Jan2013; Last Rx:09Jan2013 3. Digoxin TABS; Therapy: (Recorded:25Jan2012) to 4. Metoprolol Succinate ER 50 MG Oral Tablet Extended Release 24 Hour; Therapy:  (Recorded:25Jan2012) to 5. Multi-Day Vitamins TABS; Therapy: (Recorded:21May2012) to 6. Niaspan 500 MG Oral Tablet Extended Release; Therapy: (Recorded:25Jan2012) to 7. Pantoprazole Sodium 40 MG Oral Tablet Delayed Release; Therapy: (Recorded:25Jan2012) to 8. Toviaz 4 MG Oral Tablet Extended Release 24 Hour; TAKE 1 TABLET DAILY; Therapy: 30Nov2012  to  (Evaluate:30Mar2013)  Requested for: 30Nov2012; Last Rx:30Nov2012 9. Vitamin C TABS; Therapy: (Recorded:21May2012) to 10. Vitamin D TABS; Therapy: (Recorded:21May2012) to  Allergies Medication  1. Sulfa Drugs Non-Medication  2. Ragweed  Family History Problems  1. Family history of  Death In The Family Father died age 21 heart attack 2. Family history of  Death In The Family Mother died age 76 heart attack 3. Family history of  Family Health Status Number Of Children 1 son and 1 daughter  Social History Problems  1. Being A Social Drinker 2. Caffeine Use 2-3 per day 3. Former Smoker V15.82 Smoked about 1/2 ppd for from age 19 and quit 1986 4. Marital History - Currently Married 5. Occupation: Retired  Review of Systems Constitutional, skin, eye, otolaryngeal, hematologic/lymphatic, cardiovascular, pulmonary, endocrine, gastrointestinal, neurological and psychiatric system(s) were reviewed and pertinent findings if present are noted.  Musculoskeletal: back pain, but no joint pain.    Physical Exam Constitutional: Well nourished and well developed.  ENT:. The ears and nose are normal in appearance. The oropharynx is normal.  Neck: The appearance of the neck is normal and no neck mass is present.  Pulmonary: No respiratory distress and normal respiratory rhythm and effort.  Cardiovascular:. No peripheral edema. History of atrial fibrillation though he sounds and normal rhythm today.  Abdomen: The abdomen is soft and nontender. The abdomen is no rebound. No CVA tenderness.  Rectal: The prostate exam was deferred . Last rectal examination in November I was unable to palpate the prostate due to body habitus.  Lymphatics: The posterior cervical and supraclavicular nodes are not enlarged or tender.  Skin: Normal skin turgor and no visible rash.  Neuro/Psych:. Mood and affect are appropriate. No focal sensory  deficits.    Assessment Acute Urinary Retention  Benign Prostatic  Hypertrophy   Plan To OR for TURP.  Levaquin pre-operatively.

## 2012-02-22 ENCOUNTER — Encounter (HOSPITAL_COMMUNITY): Payer: Self-pay | Admitting: *Deleted

## 2012-02-22 ENCOUNTER — Ambulatory Visit (HOSPITAL_COMMUNITY): Payer: Medicare Other | Admitting: *Deleted

## 2012-02-22 ENCOUNTER — Encounter (HOSPITAL_COMMUNITY)
Admission: RE | Disposition: A | Discharge status: Home / Self Care | Payer: Self-pay | Source: Ambulatory Visit | Attending: Urology

## 2012-02-22 ENCOUNTER — Ambulatory Visit (HOSPITAL_COMMUNITY)
Admission: RE | Admit: 2012-02-22 | Discharge: 2012-02-23 | Disposition: A | Discharge status: Home / Self Care | Payer: Medicare Other | Source: Ambulatory Visit | Attending: Urology | Admitting: Urology

## 2012-02-22 ENCOUNTER — Encounter (HOSPITAL_COMMUNITY): Payer: Self-pay

## 2012-02-22 ENCOUNTER — Ambulatory Visit (HOSPITAL_COMMUNITY): Payer: Medicare Other

## 2012-02-22 DIAGNOSIS — K219 Gastro-esophageal reflux disease without esophagitis: Secondary | ICD-10-CM | POA: Insufficient documentation

## 2012-02-22 DIAGNOSIS — R339 Retention of urine, unspecified: Secondary | ICD-10-CM | POA: Insufficient documentation

## 2012-02-22 DIAGNOSIS — Z79899 Other long term (current) drug therapy: Secondary | ICD-10-CM | POA: Insufficient documentation

## 2012-02-22 DIAGNOSIS — E7800 Pure hypercholesterolemia, unspecified: Secondary | ICD-10-CM | POA: Insufficient documentation

## 2012-02-22 DIAGNOSIS — R269 Unspecified abnormalities of gait and mobility: Secondary | ICD-10-CM | POA: Insufficient documentation

## 2012-02-22 DIAGNOSIS — I10 Essential (primary) hypertension: Secondary | ICD-10-CM | POA: Insufficient documentation

## 2012-02-22 DIAGNOSIS — N400 Enlarged prostate without lower urinary tract symptoms: Secondary | ICD-10-CM

## 2012-02-22 DIAGNOSIS — I4891 Unspecified atrial fibrillation: Secondary | ICD-10-CM | POA: Insufficient documentation

## 2012-02-22 DIAGNOSIS — C61 Malignant neoplasm of prostate: Secondary | ICD-10-CM | POA: Insufficient documentation

## 2012-02-22 HISTORY — PX: TRANSURETHRAL RESECTION OF PROSTATE: SHX73

## 2012-02-22 LAB — BASIC METABOLIC PANEL
Chloride: 103 mEq/L (ref 96–112)
GFR calc Af Amer: 62 mL/min — ABNORMAL LOW (ref >90)
Potassium: 3.8 mEq/L (ref 3.5–5.1)

## 2012-02-22 SURGERY — TRANSURETHRAL RESECTION OF THE PROSTATE WITH GYRUS INSTRUMENTS
Anesthesia: General | Wound class: Clean Contaminated

## 2012-02-22 MED ORDER — BELLADONNA ALKALOIDS-OPIUM 16.2-60 MG RE SUPP: 1.0000 | Freq: Four times a day (QID) | RECTAL | Status: DC | PRN

## 2012-02-22 MED ORDER — SODIUM CHLORIDE 0.9 % IV SOLN
INTRAVENOUS | Status: DC
  Administered 2012-02-22: 16:00:00 via INTRAVENOUS

## 2012-02-22 MED ORDER — ACETAMINOPHEN 10 MG/ML IV SOLN
1000.0000 mg | Freq: Four times a day (QID) | INTRAVENOUS | Status: DC
  Administered 2012-02-22 – 2012-02-23 (×3): 1000 mg via INTRAVENOUS
  Filled 2012-02-22 (×4): qty 100

## 2012-02-22 MED ORDER — HYDROMORPHONE HCL PF 1 MG/ML IJ SOLN: 0.2500 mg | INTRAMUSCULAR | Status: DC | PRN

## 2012-02-22 MED ORDER — BELLADONNA ALKALOIDS-OPIUM 16.2-60 MG RE SUPP
RECTAL | Status: DC | PRN
  Administered 2012-02-22: 1 via RECTAL

## 2012-02-22 MED ORDER — LIDOCAINE HCL 1 % IJ SOLN
INTRAMUSCULAR | Status: DC | PRN
  Administered 2012-02-22: 50 mg via INTRADERMAL

## 2012-02-22 MED ORDER — LIDOCAINE HCL 2 % EX GEL
CUTANEOUS | Status: AC
  Filled 2012-02-22: qty 10

## 2012-02-22 MED ORDER — METOPROLOL SUCCINATE ER 50 MG PO TB24
50.0000 mg | ORAL_TABLET | Freq: Every day | ORAL | Status: DC
  Administered 2012-02-22: 50 mg via ORAL
  Filled 2012-02-22: qty 1

## 2012-02-22 MED ORDER — SENNOSIDES-DOCUSATE SODIUM 8.6-50 MG PO TABS
1.0000 | ORAL_TABLET | Freq: Two times a day (BID) | ORAL | Status: DC
  Administered 2012-02-22 – 2012-02-23 (×2): 1 via ORAL
  Filled 2012-02-22 (×3): qty 1

## 2012-02-22 MED ORDER — ACETAMINOPHEN 10 MG/ML IV SOLN
INTRAVENOUS | Status: AC
  Filled 2012-02-22: qty 100

## 2012-02-22 MED ORDER — PANTOPRAZOLE SODIUM 40 MG PO TBEC
40.0000 mg | DELAYED_RELEASE_TABLET | Freq: Every evening | ORAL | Status: DC
  Administered 2012-02-22: 40 mg via ORAL
  Filled 2012-02-22 (×2): qty 1

## 2012-02-22 MED ORDER — DIGOXIN 125 MCG PO TABS
0.1250 mg | ORAL_TABLET | Freq: Every day | ORAL | Status: DC
  Administered 2012-02-22 – 2012-02-23 (×2): 0.125 mg via ORAL
  Filled 2012-02-22 (×2): qty 1

## 2012-02-22 MED ORDER — LACTATED RINGERS IV SOLN: INTRAVENOUS | Status: DC

## 2012-02-22 MED ORDER — MEPERIDINE HCL 50 MG/ML IJ SOLN: 6.2500 mg | INTRAMUSCULAR | Status: DC | PRN

## 2012-02-22 MED ORDER — DROPERIDOL 2.5 MG/ML IJ SOLN: 0.6250 mg | INTRAMUSCULAR | Status: DC | PRN

## 2012-02-22 MED ORDER — MORPHINE SULFATE 2 MG/ML IJ SOLN: 2.0000 mg | INTRAMUSCULAR | Status: DC | PRN

## 2012-02-22 MED ORDER — ACETAMINOPHEN 325 MG PO TABS: 650.0000 mg | ORAL_TABLET | ORAL | Status: DC | PRN

## 2012-02-22 MED ORDER — BELLADONNA ALKALOIDS-OPIUM 16.2-60 MG RE SUPP
RECTAL | Status: AC
  Filled 2012-02-22: qty 1

## 2012-02-22 MED ORDER — SODIUM CHLORIDE 0.9 % IR SOLN
3000.0000 mL | Status: DC
  Administered 2012-02-22: 3000 mL

## 2012-02-22 MED ORDER — LACTATED RINGERS IV SOLN
INTRAVENOUS | Status: DC | PRN
  Administered 2012-02-22 (×2): via INTRAVENOUS

## 2012-02-22 MED ORDER — ACETAMINOPHEN 10 MG/ML IV SOLN
INTRAVENOUS | Status: DC | PRN
  Administered 2012-02-22: 1000 mg via INTRAVENOUS

## 2012-02-22 MED ORDER — NIACIN ER (ANTIHYPERLIPIDEMIC) 500 MG PO TBCR
500.0000 mg | EXTENDED_RELEASE_TABLET | Freq: Every day | ORAL | Status: DC
  Administered 2012-02-22: 500 mg via ORAL
  Filled 2012-02-22 (×3): qty 1

## 2012-02-22 MED ORDER — LACTATED RINGERS IV SOLN
INTRAVENOUS | Status: DC
  Administered 2012-02-22: 1000 mL via INTRAVENOUS

## 2012-02-22 MED ORDER — LEVOFLOXACIN IN D5W 500 MG/100ML IV SOLN
500.0000 mg | Freq: Once | INTRAVENOUS | Status: DC
  Filled 2012-02-22: qty 100

## 2012-02-22 MED ORDER — FENTANYL CITRATE 0.05 MG/ML IJ SOLN
INTRAMUSCULAR | Status: DC | PRN
  Administered 2012-02-22 (×3): 50 ug via INTRAVENOUS

## 2012-02-22 MED ORDER — PHENYLEPHRINE HCL 10 MG/ML IJ SOLN
INTRAMUSCULAR | Status: DC | PRN
  Administered 2012-02-22 (×3): 40 ug via INTRAVENOUS
  Administered 2012-02-22: 20 ug via INTRAVENOUS

## 2012-02-22 MED ORDER — PROPOFOL 10 MG/ML IV EMUL
INTRAVENOUS | Status: DC | PRN
  Administered 2012-02-22: 175 mg via INTRAVENOUS

## 2012-02-22 MED ORDER — BACITRACIN-NEOMYCIN-POLYMYXIN 400-5-5000 EX OINT: 1.0000 "application " | TOPICAL_OINTMENT | Freq: Three times a day (TID) | CUTANEOUS | Status: DC | PRN

## 2012-02-22 MED ORDER — SODIUM CHLORIDE 0.9 % IR SOLN
Status: DC | PRN
  Administered 2012-02-22 (×3): 3000 mL
  Administered 2012-02-22: 6000 mL

## 2012-02-22 MED ORDER — OXYCODONE HCL 5 MG PO TABS: 5.0000 mg | ORAL_TABLET | ORAL | Status: DC | PRN

## 2012-02-22 MED ORDER — ONDANSETRON HCL 4 MG/2ML IJ SOLN
INTRAMUSCULAR | Status: DC | PRN
  Administered 2012-02-22 (×2): 2 mg via INTRAVENOUS

## 2012-02-22 MED ORDER — METOPROLOL SUCCINATE ER 50 MG PO TB24
50.0000 mg | ORAL_TABLET | Freq: Every day | ORAL | Status: DC
  Filled 2012-02-22 (×2): qty 1

## 2012-02-22 MED ORDER — ONDANSETRON HCL 4 MG/2ML IJ SOLN: 4.0000 mg | INTRAMUSCULAR | Status: DC | PRN

## 2012-02-22 MED ORDER — CEFAZOLIN SODIUM-DEXTROSE 2-3 GM-% IV SOLR: 2.0000 g | Freq: Once | INTRAVENOUS | Status: DC

## 2012-02-22 SURGICAL SUPPLY — 31 items
BAG URINE DRAINAGE (UROLOGICAL SUPPLIES) ×1 IMPLANT
BAG URO CATCHER STRL LF (DRAPE) ×2 IMPLANT
BLADE SURG 15 STRL LF DISP TIS (BLADE) IMPLANT
BLADE SURG 15 STRL SS (BLADE)
CATH COUDE 24FR 5CC (CATHETERS) IMPLANT
CATH HEMA 3WAY 30CC 24FR COUDE (CATHETERS) ×1 IMPLANT
CATH RIBBED COUDE  30CC (CATHETERS)
CATH RIBBED COUDE 30CC (CATHETERS)
CLOTH BEACON ORANGE TIMEOUT ST (SAFETY) ×2 IMPLANT
DRAPE CAMERA CLOSED 9X96 (DRAPES) ×2 IMPLANT
ELECT LOOP MED HF 24F 12D (CUTTING LOOP) ×1 IMPLANT
ELECT LOOP MED HF 24F 12D CBL (CLIP) ×1 IMPLANT
ELECT REM PT RETURN 9FT ADLT (ELECTROSURGICAL)
ELECT RESECT VAPORIZE 12D CBL (ELECTRODE) ×1 IMPLANT
ELECTRODE REM PT RTRN 9FT ADLT (ELECTROSURGICAL) ×1 IMPLANT
GLOVE BIOGEL M 7.0 STRL (GLOVE) ×4 IMPLANT
GOWN STRL NON-REIN LRG LVL3 (GOWN DISPOSABLE) ×2 IMPLANT
GUIDEWIRE STR DUAL SENSOR (WIRE) ×1 IMPLANT
HOLDER FOLEY CATH W/STRAP (MISCELLANEOUS) IMPLANT
KIT ASPIRATION TUBING (SET/KITS/TRAYS/PACK) ×1 IMPLANT
KIT SUPRAPUBIC CATH (MISCELLANEOUS) IMPLANT
MANIFOLD NEPTUNE II (INSTRUMENTS) ×2 IMPLANT
NDL SPNL 18GX3.5 QUINCKE PK (NEEDLE) IMPLANT
NEEDLE SPNL 18GX3.5 QUINCKE PK (NEEDLE) IMPLANT
NS IRRIG 1000ML POUR BTL (IV SOLUTION) ×2 IMPLANT
PACK CYSTO (CUSTOM PROCEDURE TRAY) ×2 IMPLANT
SET WARMING FLUID IRRIGATION (MISCELLANEOUS) ×1 IMPLANT
SUT ETHILON 3 0 PS 1 (SUTURE) IMPLANT
SYR 30ML LL (SYRINGE) IMPLANT
SYRINGE IRR TOOMEY STRL 70CC (SYRINGE) ×2 IMPLANT
TUBING CONNECTING 10 (TUBING) ×2 IMPLANT

## 2012-02-22 NOTE — Anesthesia Preprocedure Evaluation (Signed)
Anesthesia Evaluation  Patient identified by MRN, date of birth, ID band Patient awake    Reviewed: Allergy & Precautions, H&P , NPO status , Patient's Chart, lab work & pertinent test results  Airway Mallampati: II TM Distance: >3 FB Neck ROM: Full    Dental No notable dental hx.    Pulmonary neg pulmonary ROS, sleep apnea ,  breath sounds clear to auscultation  Pulmonary exam normal       Cardiovascular hypertension, negative cardio ROS  + dysrhythmias Atrial Fibrillation Rhythm:Regular Rate:Normal     Neuro/Psych negative neurological ROS  negative psych ROS   GI/Hepatic negative GI ROS, Neg liver ROS,   Endo/Other  negative endocrine ROS  Renal/GU negative Renal ROS  negative genitourinary   Musculoskeletal negative musculoskeletal ROS (+)   Abdominal   Peds negative pediatric ROS (+)  Hematology negative hematology ROS (+)   Anesthesia Other Findings   Reproductive/Obstetrics negative OB ROS                           Anesthesia Physical Anesthesia Plan  ASA: III  Anesthesia Plan: General   Post-op Pain Management:    Induction: Intravenous  Airway Management Planned:   Additional Equipment:   Intra-op Plan:   Post-operative Plan: Extubation in OR  Informed Consent: I have reviewed the patients History and Physical, chart, labs and discussed the procedure including the risks, benefits and alternatives for the proposed anesthesia with the patient or authorized representative who has indicated his/her understanding and acceptance.   Dental advisory given  Plan Discussed with: CRNA  Anesthesia Plan Comments:         Anesthesia Quick Evaluation

## 2012-02-22 NOTE — Preoperative (Signed)
Beta Blockers   Reason not to administer Beta Blockers:Not Applicable 

## 2012-02-22 NOTE — Transfer of Care (Signed)
Immediate Anesthesia Transfer of Care Note  Patient: [name] [name]  Procedure(s) Performed: Procedure(s) (LRB): TRANSURETHRAL RESECTION OF THE PROSTATE WITH GYRUS INSTRUMENTS (N/A)  Patient Location: PACU  Anesthesia Type: General  Level of Consciousness: awake, alert , oriented and patient cooperative  Airway & Oxygen Therapy: Patient Spontanous Breathing and Patient connected to face mask oxygen  Post-op Assessment: Report given to PACU RN, Post -op Vital signs reviewed and stable and Patient moving all extremities  Post vital signs: Reviewed and stable  Complications: No apparent anesthesia complications

## 2012-02-22 NOTE — Brief Op Note (Signed)
02/22/2012  1:30 PM  PATIENT:  Judeth Porch  78 y.o. male  PRE-OPERATIVE DIAGNOSIS:  Urinary Retention, Benign Prostatic Hypertrophy  POST-OPERATIVE DIAGNOSIS:  Urinary Retention, Benign Prostatic Hypertrophy  PROCEDURE:  Procedure(s) (LRB): TRANSURETHRAL RESECTION OF THE PROSTATE WITH GYRUS INSTRUMENTS (N/A)  SURGEON:  Surgeon(s) and Role:    * Milford Cage, MD - Primary  PHYSICIAN ASSISTANT:   ASSISTANTS: none   ANESTHESIA:   general  EBL:  Total I/O In: 1000 [I.V.:1000] Out: -   BLOOD ADMINISTERED:none  DRAINS: Urinary Catheter (Foley)   LOCAL MEDICATIONS USED:  NONE  SPECIMEN:  Source of Specimen:  prostate chips  DISPOSITION OF SPECIMEN:  PATHOLOGY  COUNTS:  YES  TOURNIQUET:  * No tourniquets in log *  DICTATION: .Other Dictation: Dictation Number [id number]  PLAN OF CARE: Admit for overnight observation  PATIENT DISPOSITION:  PACU - hemodynamically stable.   Delay start of Pharmacological VTE agent (>24hrs) due to surgical blood loss or risk of bleeding: yes

## 2012-02-22 NOTE — Progress Notes (Signed)
Patient had 2.9 sec pause at aroung 7:15pm, still with atrial fib, rate controlled, Vital signs stable, patient was resting on bed and asymptomatic. Dr. Myer Haff, Marchelle Folks was made aware. No new orders made. Will continue to monitor patient.

## 2012-02-22 NOTE — Progress Notes (Signed)
GU    Patient doing well w/o complaints.  CBI running and outflow is clear. Post-op chest x-ray without acute findings.  BMP looks good.  Filed Vitals:   02/22/12 1504  BP: 123/83  Pulse: 81  Temp: 98 F (36.7 C)  Resp: 18   Gen: NAD Abd: soft, NTTP  A/P: TURP today -Continue CBI -D/c home tomorrow

## 2012-02-22 NOTE — Progress Notes (Signed)
Patient had another pause with 2.30 seconds pause. Patient resting on bed with no complaints made. Pecola Leisure PA on call was made aware. We will continue to monitor patent.

## 2012-02-23 ENCOUNTER — Other Ambulatory Visit: Payer: Self-pay

## 2012-02-23 MED ORDER — HYDROCODONE-ACETAMINOPHEN 5-325 MG PO TABS: 1.0000 | ORAL_TABLET | ORAL | Status: AC | PRN

## 2012-02-23 MED ORDER — HYOSCYAMINE SULFATE 0.125 MG PO TABS: 0.1250 mg | ORAL_TABLET | ORAL | Status: DC | PRN

## 2012-02-23 MED ORDER — RIVAROXABAN 15 MG PO TABS: 15.0000 mg | ORAL_TABLET | Freq: Every day | ORAL | Status: DC

## 2012-02-23 MED FILL — Cefazolin Sodium For Inj 1 GM: INTRAMUSCULAR | Qty: 2 | Status: AC

## 2012-02-23 NOTE — Progress Notes (Signed)
Patient had a total of 5 pauses from last night ranging from 2.03- 2.90 seconds. Patient has been asymptomatic, vital signs remained stable. Dr. Mena Goes was made aware, no new orders made. Will continue to monitor patient

## 2012-02-23 NOTE — Discharge Instructions (Signed)
DISCHARGE INSTRUCTIONS FOR TURP  MEDICATIONS:  1. RESUME YOUR Xaralto on 02/27/12.  2. Resume all your other meds from home, including finasteride and tamsulosin if you were taking them before.  ACTIVITY 1. No heavy lifting >10 pounds for 2 weeks 2. No sexual activity for 2 weeks 3. No strenuous activity for 2 weeks 4. No driving while on narcotic pain medications 5. Drink plenty of water 6. Continue to walk at home - you can still get blood clots when you are at  home, so keep active, but don't over do it. 7. Your urine may have some blood in it - make sure you drink plenty of water,  call or come to the ER immediately if you can't urinate at all 8. No straddle activity (such as riding a bike or a horse)for 3 weeks.  BATHING 1. You can shower. Do not take baths or submerge the catheter underwater.  CATHETER (If you are discharged with a catheter.) 1. Keep your catheter secured to your leg at all times with tape. 2. You may experience leakage of urine around your catheter- as long as the  catheter continues to drain, this is normal.  If your catheter stops draining  return to the ER, but do not let anyone other than a urologist replace your  catheter. 3. You may also have blood in your urine, even after it has been clear for  several days; you may even pass some small blood clots or other material.  This  is normal as well.  If this happens, sit down and drink plenty of water to help  make urine to flush out your bladder.  If the blood in your urine becomes worse  after doing this, contact our office or return to the ER. 4. You may use the leg bag (small bag) during the day, but use the large bag at  night.    SIGNS/SYMPTOMS TO CALL: 1. Please call us if you have a fever greater than 101.5, uncontrolled  nausea/vomiting, uncontrolled pain, dizziness, unable to urinate, bloody urine,  chest pain, shortness of breath, leg swelling, leg pain, or any other concerns  or  questions.  You can reach [place] at [phone].

## 2012-02-23 NOTE — Progress Notes (Signed)
Urology Progress Note  Subjective:     No acute urologic events overnight. Pain well controlled.  No nausea, CP, or SOB.  Cough stable.  Had several cardiac rhythm pauses which were asymptomatic overnight.  Objective:  Patient Vitals for the past 24 hrs:  BP Temp Temp src Pulse Resp SpO2 Height Weight  02/23/12 0534 127/83 mmHg 98.4 F (36.9 C) Oral 77  18  97 % - -  02/23/12 0203 118/80 mmHg 98.4 F (36.9 C) Oral 70  18  96 % - -  02/22/12 2135 121/75 mmHg 98.3 F (36.8 C) Oral 60  20  93 % - -  02/22/12 1930 151/74 mmHg - - 67  18  94 % - -  02/22/12 1800 123/85 mmHg 97.6 F (36.4 C) Oral 73  18  91 % - -  02/22/12 1509 - - - - - - 5\' 7"  (1.702 m) 92.897 kg (204 lb 12.8 oz)  02/22/12 1504 123/83 mmHg 98 F (36.7 C) Oral 81  18  94 % - -  02/22/12 1445 110/58 mmHg 97.8 F (36.6 C) - - - 99 % - -  02/22/12 1415 126/71 mmHg 98 F (36.7 C) - 84  17  100 % - -  02/22/12 1400 122/80 mmHg - - 87  17  100 % - -  02/22/12 1345 148/98 mmHg 98.6 F (37 C) - 77  20  100 % - -  02/22/12 0949 135/98 mmHg 98.6 F (37 C) - 85  20  95 % - -    Physical Exam: General:  No acute distress, awake Cardiovascular:    []   S1/S2 present, RRR  [x]   Irregularly irregular Chest:  CTA-B Abdomen:               []  Soft, appropriately TTP  [x]  Soft, NTTP  []  Soft, appropriately TTP, incision(s) clean/dry/intact  Genitourinary: Foley in place Foley:  Draining light pink urine off CBI    I/O last 3 completed shifts: In: 2764.2 [P.O.:360; I.V.:2104.2; IV Piggyback:300] Out: 625 [Urine:625]  No results found for this basename: HGB:2,WBC:2,PLT:2 in the last 72 hours  Recent Labs  Basename 02/22/12 1402   NA 137   K 3.8   CL 103   CO2 27   BUN 19   CREATININE 1.21   CALCIUM 8.9   GFRNONAA 53*   GFRAA 62*     No results found for this basename: PT:2,INR:2,APTT:2 in the last 72 hours   No components found with this basename: ABG:2     Assessment: BPH/Urinary retention POD#1  TURP Plan: -Urine remains clear off CBI -Spoke with on call MD for [facility], Dr. Melburn Popper; it is normal for patient's with A-fib to have pauses on telemetry and he needs no further intervention at this time. -D/c home.   Natalia Leatherwood, MD [phone]

## 2012-02-23 NOTE — Discharge Summary (Signed)
Physician Discharge Summary  Patient ID: [name] [name] MRN: [id number] DOB/AGE: [birth year] 78 y.o.  Admit date: 02/22/2012 Discharge date: 02/23/2012  Admission Diagnoses: BPH  Discharge Diagnoses: BPH  Discharged Condition: good  Hospital Course:  Patient was admitted after TURP for continued bladder irrigation. This was turned off on postoperative day one and his urine remained clear. He had several pauses on telemetry of his heart rhythm, but the patient has atrial fibrillation. [facility] was contacted and recommended that this was normal and no further workup was needed. Patient was cleared to go home.  Consults: None  Significant Diagnostic Studies: None  Treatments: surgery: TURP  Discharge Exam: Blood pressure 127/83, pulse 77, temperature 98.4 F (36.9 C), temperature source Oral, resp. rate 18, height 5\' 7"  (1.702 m), weight 92.897 kg (204 lb 12.8 oz), SpO2 97.00%. See PE from progress note on date of discharge.  Disposition: 01-Home or Self Care  Discharge Orders    Future Appointments: Provider: Department: Dept Phone: Center:   03/07/2012 10:00 AM [facility]  None     Future Orders Please Complete By Expires   Discharge patient        Medication List  As of 02/23/2012  8:27 AM   TAKE these medications         digoxin 0.125 MG tablet   Commonly known as: LANOXIN   TAKE 1 TABLET BY MOUTH EVERY DAY      HYDROcodone-acetaminophen 5-325 MG per tablet   Commonly known as: NORCO   Take 1-2 tablets by mouth every 4 (four) hours as needed for pain.      hyoscyamine 0.125 MG tablet   Commonly known as: LEVSIN, ANASPAZ   Take 1 tablet (0.125 mg total) by mouth every 4 (four) hours as needed for cramping (bladder spasms).      metoprolol succinate 50 MG 24 hr tablet   Commonly known as: TOPROL-XL   Take 50 mg by mouth at bedtime.      mulitivitamin with minerals Tabs   Take 1 tablet by mouth at bedtime.     niacin 500 MG CR tablet   Commonly known as: NIASPAN   Take 500 mg by mouth at bedtime.      pantoprazole 40 MG tablet   Commonly known as: PROTONIX   Take 40 mg by mouth every evening.      Rivaroxaban 15 MG Tabs tablet   Commonly known as: XARELTO   Take 1 tablet (15 mg total) by mouth daily.   Start taking on: 02/27/2012      vitamin C 500 MG tablet   Commonly known as: ASCORBIC ACID   Take 500 mg by mouth at bedtime.      Vitamin D 1000 UNITS capsule   Take 1,000 Units by mouth at bedtime.           Follow-up Information    Follow up with Saunders Revel, PA on 02/29/2012. (1:45 pm)    Contact information:   [address] [facility] [address] [facility] [place] [address] [phone]          Signed: Milford Cage 02/23/2012, 8:27 AM

## 2012-02-23 NOTE — Op Note (Signed)
NAME:  [name] [name], WELLER NO.:  [id number]  MEDICAL RECORD NO.:  [id number]  LOCATION:  1406                         FACILITY:  [facility]  PHYSICIAN:  Natalia Leatherwood, MD    DATE OF BIRTH:  12/04/1926  DATE OF PROCEDURE:  02/22/2012 DATE OF DISCHARGE:                              OPERATIVE REPORT   SURGEON:  Natalia Leatherwood, MD  ASSISTANT:  None.  PREOPERATIVE DIAGNOSES:  Urinary retention and benign prostatic hypertrophy.  POSTOPERATIVE DIAGNOSES:  Urinary retention and benign prostatic hypertrophy.  PROCEDURES PERFORMED:  Transurethral resection of prostate.  ESTIMATED BLOOD LOSS:  20 cc.  SPECIMEN:  Prostate chips sent for permanent pathology.  FINDINGS:  Bilateral orthotopic ureteral orifices obstructing anterior prostatic tissue.  Multiple calcifications in the prostatic parenchyma.  DRAINS:  Foley catheter.  HISTORY OF PRESENT ILLNESS:  An 78 year old gentleman who is experiencing urinary retention.  He is unable to pass a voiding trial. After review of the risks and benefits of the procedure, TURP versus other alternatives, he was elected to have a TURP and presents for that procedure today.  PROCEDURE IN DETAIL:  Informed consent was obtained.  The patient was taken to the operating room, where he was placed in a supine position. IV antibiotics were infused and general anesthesia was induced.  He was then placed in a dorsal lithotomy position where all pertinent neurovascular pressure points were padded appropriately.  His genitals were then prepped and draped in usual sterile fashion.  Following this, a time-out was performed which the correct patient, surgical site, and procedure were all identified and agreed upon by the team.  Following this, the visual obturator to the gyrus resectoscope was placed through the urethra in the bladder.  Both ureteral orifices were found to be orthotopic and were identified and marked and there was a mark placed  on the bladder mucosa distal to the ureteral orifices with the resection loop when it was placed.  Resection was carried out at the 6 o'clock position back to the verumontanum and then a groove was created on both sides of verumontanum to make sure to stay proximal to the urethral sphincter.  The resection was carried down to the prostatic capsule on the left side first resecting from the bladder neck back to the verumontanum.  The same resection was carried out on the right side, down to the prostatic capsule from the bladder neck back to the verumontanum.  It was noted that most of the tissue was in the anterior portion of the prostate.  There was also obstructing tissue that fell into the prostatic urethra as the lateral side of the prostate were resected and some of this tissue was resected as well.  Good hemostasis was achieved and then all prostate chips were removed from the bladder and sent to pathology. The patient did begin to experience some hypotension and as the case was nearing an end, we felt that this was an adequate resection. Visualization from the more distal aspect beyond the sphincter revealed good channel through the prostate.  The resection scope was removed and a 24-French 3-way hematuria catheter was placed with ease with 30 cc of sterile water placed in the balloon.  Continuous bladder irrigation was hooked up after the bladder was irrigated with a Toomey syringe and found to be clear.  This completed the procedure.  Anesthesia was reversed.  He was taken to the PACU in stable condition.  He will be kept overnight for observation.          ______________________________ Natalia Leatherwood, MD     DW/MEDQ  D:  02/22/2012  T:  02/23/2012  Job:  [id number]

## 2012-03-07 ENCOUNTER — Encounter (HOSPITAL_COMMUNITY): Payer: Self-pay | Admitting: Urology

## 2012-03-10 ENCOUNTER — Telehealth: Payer: Self-pay | Admitting: Cardiovascular Disease

## 2012-03-10 NOTE — Telephone Encounter (Signed)
Please return call to patient Wife Ms. Patsy Lager [phone] home  Or [phone] Cell   Patient wife has questions about patient instructions.   Please return call to patient on hm# or cell#

## 2012-03-10 NOTE — Telephone Encounter (Signed)
I spoke with the pt's wife and made her aware of Dr Earmon Phoenix recommendations. The pt is scheduled to see Dr Hilario Quarry office on Monday and will discuss restarting Xarelto at that time.    He should continue Niacin. OK to start back on Xarelto when Dr Margarita Grizzle clears him. He may need to stay off for another week or two since he has had postoperative bleeding. ----- Message ----- From: Ennis Forts, RN Sent: 03/07/2012 3:09 PM To: Sharyn Blitz, RN, Tonny Bollman, MD  I saw patient for final Engage Study visit on 03/07/12.I want to make you aware that patient restarted Xarelto 15 mg qd for atrial fib.on 02/27/12 after having TURP on 02/22/12. Patient started with hematuria on 03/02/12. Patient was told to stop Xarelto 15 mg qd on 03/05/12. Patient remains off Xarelot 15 mg qd. Patient was to follow-up with Dr. Hilario Quarry office today. I wanted you to beware. Patient is taking Niapan 500 mg at hs. Do you want patient to remain on this drug? I was not sure since there had been unfavorable info at recent [facility] meeting. Thanks, Maureen Ralphs

## 2012-03-22 ENCOUNTER — Telehealth: Payer: Self-pay | Admitting: *Deleted

## 2012-03-22 NOTE — Telephone Encounter (Signed)
I spoke with patient and patient's wife. He stated that urine has cleared up and no more bleeding noted from recent TURP surgery. Patient started back on Xarelto 15mg  qd on 03/16/12. Dr. Margarita Grizzle stated okay for patient to start Xarelto again.Patient has now completed Engage Study and will follow-up with Dr. Excell Seltzer as scheduled.

## 2012-03-29 ENCOUNTER — Other Ambulatory Visit: Payer: Self-pay | Admitting: Cardiovascular Disease

## 2012-07-22 ENCOUNTER — Encounter: Payer: Self-pay | Admitting: Cardiovascular Disease

## 2012-07-22 ENCOUNTER — Ambulatory Visit (INDEPENDENT_AMBULATORY_CARE_PROVIDER_SITE_OTHER): Payer: Medicare Other | Admitting: Cardiovascular Disease

## 2012-07-22 VITALS — BP 140/76 | Resp 18 | Ht 67.0 in | Wt 206.7 lb

## 2012-07-22 DIAGNOSIS — I10 Essential (primary) hypertension: Secondary | ICD-10-CM

## 2012-07-22 DIAGNOSIS — I4891 Unspecified atrial fibrillation: Secondary | ICD-10-CM

## 2012-07-22 DIAGNOSIS — E785 Hyperlipidemia, unspecified: Secondary | ICD-10-CM

## 2012-07-22 NOTE — Patient Instructions (Addendum)
Your physician wants you to follow-up in:  6 months. You will receive a reminder letter in the mail two months in advance. If you don't receive a letter, please call our office to schedule the follow-up appointment.   

## 2012-07-22 NOTE — Progress Notes (Signed)
   HPI:  78 year old gentleman presenting for followup evaluation. The patient is followed for permanent atrial fibrillation, hypertension, and obesity. He's been anticoagulated with Xarelto. He remains physically inactive and really doesn't get much activity at all. He sits or sleeps most of the day. He notes difficulty with sleep at night. No chest pain or pressure. No palps, lightheadedness, or syncope. Chronic DOE unchanged.  Outpatient Encounter Prescriptions as of 07/22/2012  Medication Sig Dispense Refill  . Cholecalciferol (VITAMIN D) 1000 UNITS capsule Take 1,000 Units by mouth at bedtime.       . digoxin (LANOXIN) 0.125 MG tablet TAKE 1 TABLET BY MOUTH EVERY DAY  30 tablet  6  . metoprolol (TOPROL-XL) 50 MG 24 hr tablet Take 50 mg by mouth at bedtime.       . Multiple Vitamin (MULITIVITAMIN WITH MINERALS) TABS Take 1 tablet by mouth at bedtime.      . niacin (NIASPAN) 500 MG CR tablet Take 500 mg by mouth at bedtime.        . pantoprazole (PROTONIX) 40 MG tablet Take 40 mg by mouth every evening.       . Rivaroxaban (XARELTO) 15 MG TABS tablet Take 1 tablet (15 mg total) by mouth daily.  1 tablet  0  . vitamin C (ASCORBIC ACID) 500 MG tablet Take 500 mg by mouth at bedtime.       . hyoscyamine (LEVSIN, ANASPAZ) 0.125 MG tablet Take 1 tablet (0.125 mg total) by mouth every 4 (four) hours as needed for cramping (bladder spasms).  40 tablet  4    Allergies  Allergen Reactions  . Sulfa Antibiotics Other (See Comments)    Unknown reaction    Past Medical History  Diagnosis Date  . Hypertension   . Hyperlipidemia   . Chronic atrial fibrillation   . Urinary retention foley last changed 3 weeks ago  . Arthritis   . Sleep apnea     no cpap used, sleep study was several yrs ago    ROS: Negative except as per HPI  BP 140/76  Resp 18  Ht 5\' 7"  (1.702 m)  Wt 206 lb 11.2 oz (93.759 kg)  BMI 32.37 kg/m2  PHYSICAL EXAM: Pt is alert and oriented, pleasant elderly gentleman in NAD,  overweight HEENT: normal Neck: JVP - normal, carotids 2+= without bruits Lungs: CTA bilaterally CV: irregularly irregular without murmur or gallop Abd: soft, NT, Positive BS, no hepatomegaly Ext: no C/C/E, distal pulses intact and equal Skin: warm/dry no rash  EKG:  Atrial fibrillation 95 beats per minute, nonspecific IVCD, possible age-indeterminate anteroseptal infarct.  ASSESSMENT AND PLAN:

## 2012-07-26 NOTE — Assessment & Plan Note (Signed)
Controlled.  

## 2012-07-26 NOTE — Assessment & Plan Note (Signed)
Tolerating Xarelto without bleeding problems. Remains rate-controlled on digoxin/metoprolol succinate. Difficult to assess symptoms as he has very poor functional status.

## 2012-07-26 NOTE — Assessment & Plan Note (Signed)
Lipids followed by Dr Wylene Simmer.

## 2012-12-28 ENCOUNTER — Ambulatory Visit: Payer: Medicare Other | Admitting: Cardiovascular Disease

## 2012-12-28 ENCOUNTER — Ambulatory Visit (INDEPENDENT_AMBULATORY_CARE_PROVIDER_SITE_OTHER)
Admission: RE | Admit: 2012-12-28 | Discharge: 2012-12-28 | Disposition: A | Discharge status: Home / Self Care | Payer: Medicare Other | Source: Ambulatory Visit | Attending: Physician Assistant | Admitting: Physician Assistant

## 2012-12-28 ENCOUNTER — Ambulatory Visit (INDEPENDENT_AMBULATORY_CARE_PROVIDER_SITE_OTHER): Payer: Medicare Other | Admitting: Physician Assistant

## 2012-12-28 ENCOUNTER — Encounter: Payer: Self-pay | Admitting: Physician Assistant

## 2012-12-28 VITALS — BP 128/70 | HR 102 | Ht 68.0 in | Wt 216.0 lb

## 2012-12-28 DIAGNOSIS — R0609 Other forms of dyspnea: Secondary | ICD-10-CM

## 2012-12-28 DIAGNOSIS — W19XXXA Unspecified fall, initial encounter: Secondary | ICD-10-CM | POA: Insufficient documentation

## 2012-12-28 DIAGNOSIS — R0989 Other specified symptoms and signs involving the circulatory and respiratory systems: Secondary | ICD-10-CM

## 2012-12-28 DIAGNOSIS — R0600 Dyspnea, unspecified: Secondary | ICD-10-CM | POA: Insufficient documentation

## 2012-12-28 DIAGNOSIS — I4891 Unspecified atrial fibrillation: Secondary | ICD-10-CM

## 2012-12-28 HISTORY — DX: Dyspnea, unspecified: R06.00

## 2012-12-28 LAB — BASIC METABOLIC PANEL
BUN: 19 mg/dL (ref 6–23)
Calcium: 9.6 mg/dL (ref 8.4–10.5)
Creatinine, Ser: 1.8 mg/dL — ABNORMAL HIGH (ref 0.4–1.5)
GFR: 39.31 mL/min — ABNORMAL LOW (ref >60.00)
Glucose, Bld: 151 mg/dL — ABNORMAL HIGH (ref 70–99)

## 2012-12-28 MED ORDER — POTASSIUM CHLORIDE CRYS ER 20 MEQ PO TBCR: 20.0000 meq | EXTENDED_RELEASE_TABLET | Freq: Every day | ORAL | Status: DC

## 2012-12-28 MED ORDER — FUROSEMIDE 40 MG PO TABS: 40.0000 mg | ORAL_TABLET | Freq: Every day | ORAL | Status: DC

## 2012-12-28 NOTE — Assessment & Plan Note (Signed)
Chronic, rate controlled, on Xarelto

## 2012-12-28 NOTE — Assessment & Plan Note (Signed)
Patient fell last night leading his dog out by tripping over a carpet he hit his right ribs and his head. There is no evidence of bruising or significant bleeding. We are getting a chest x-ray to look for heart failure but also for  broken ribs.

## 2012-12-28 NOTE — Progress Notes (Signed)
HPI:  This is an 79 year old gentleman patient of Dr. Excell Seltzer who has history of chronic atrial fibrillation, hypertension, and obesity and is anticoagulated with Xarelto.  He is here today for routine follow-up. He complains of dyspnea on exertion and feels a little short of breath when he lays down at night. He's also been coughing a lot at night and I can hear a rattle in his chest when he talks to me. He denies any chest pain, palpitations, dizziness, or presyncope. Last night he tripped on the rug when he was trying to let his dog out and fell and hit his right side against a cabinet before falling to the floor. He is very unsteady on his feet and has been for while. He is sore in his right chest but otherwise has no other symptoms from the fall.Is also gained 10 pounds since his last office visit in August 2013. His wife states he does eat a lot of sweets and does not get exercise   Allergies: -- Sulfa Antibiotics -- Other (See Comments)   --  Unknown reaction  Current Outpatient Prescriptions on File Prior to Visit: Cholecalciferol (VITAMIN D) 1000 UNITS capsule, Take 1,000 Units by mouth at bedtime. , Disp: , Rfl:  digoxin (LANOXIN) 0.125 MG tablet, TAKE 1 TABLET BY MOUTH EVERY DAY, Disp: 30 tablet, Rfl: 6 donepezil (ARICEPT) 5 MG tablet, Take 5 mg by mouth at bedtime as needed., Disp: , Rfl:  metoprolol (TOPROL-XL) 50 MG 24 hr tablet, Take 50 mg by mouth at bedtime. , Disp: , Rfl:  Multiple Vitamin (MULITIVITAMIN WITH MINERALS) TABS, Take 1 tablet by mouth at bedtime., Disp: , Rfl:  niacin (NIASPAN) 500 MG CR tablet, Take 500 mg by mouth at bedtime.  , Disp: , Rfl:  pantoprazole (PROTONIX) 40 MG tablet, Take 40 mg by mouth every evening. , Disp: , Rfl:  Rivaroxaban (XARELTO) 15 MG TABS tablet, Take 1 tablet (15 mg total) by mouth daily., Disp: 1 tablet, Rfl: 0 vitamin C (ASCORBIC ACID) 500 MG tablet, Take 500 mg by mouth at bedtime. , Disp: , Rfl:  hyoscyamine (LEVSIN, ANASPAZ) 0.125 MG  tablet, Take 1 tablet (0.125 mg total) by mouth every 4 (four) hours as needed for cramping (bladder spasms)., Disp: 40 tablet, Rfl: 4    Past Medical History:   Hypertension                                                 Hyperlipidemia                                               Chronic atrial fibrillation                                  Urinary retention                               foley las*   Arthritis  Sleep apnea                                                    Comment:no cpap used, sleep study was several yrs ago  Past Surgical History:   BACK SURGERY                                    yrs ago        Comment:lower back surgery   TONSILLECTOMY                                   as child     PROSTATE SURGERY                                yrs ago, *   TRANSURETHRAL RESECTION OF PROSTATE             02/22/2012       Comment:Procedure: TRANSURETHRAL RESECTION OF THE               PROSTATE WITH GYRUS INSTRUMENTS;  Surgeon:               Milford Cage, MD;  Location: [facility];                Service: Urology;  Laterality: N/A;    No family history on file.   Social History   Marital Status: Married             Spouse Name:                      Years of Education:                 Number of children:             Occupational History   None on file  Social History Main Topics   Smoking Status: Former Smoker                   Packs/Day: .5    Years: 50        Types: Cigarettes   Smokeless Status: Never Used                       Alcohol Use: Yes               Comment: rare use   Drug Use: No             Sexual Activity: Not on file        Other Topics            Concern   None on file  Social History Narrative   None on file    ROS: See history of present illness otherwise negative  PHYSICAL EXAM: Obese, in no acute distress. Neck: No JVD, HJR, Bruit, or thyroid enlargement  Lungs: Decreased breath  sounds her out with Rales and crackles at the left base  Cardiovascular: irregular irregular, PMI not displaced,2/6 systolic murmur at the left border, no gallops, bruit, thrill, or heave.  Abdomen:tender on his right side in  his lower right ribs, no bruising, BS normal. Soft without organomegaly, masses, lesions or tenderness.  Extremities: +1-2 edema bilaterally, otherwise lower extremities without cyanosis, clubbing. Good distal pulses bilateral  SKin: Warm, no lesions or rashes   Musculoskeletal: No deformities  Neuro: no focal signs. Head has a small laceration where he hit his head when he fell last night. There is no excessive bleeding or bruising and is mildly tender.  BP 128/70  Pulse 102  Ht 5\' 8"  (1.727 m)  Wt 216 lb (97.977 kg)  BMI 32.84 kg/m2

## 2012-12-28 NOTE — Assessment & Plan Note (Signed)
Patient has chronic dyspnea on exertion but he is complaining of orthopnea and has 10 pound weight gain with leg edema and rales on exam. We'll give him Lasix and potassium, will order 2-D echo BNP, and BMP. He will also have a chest x-ray

## 2012-12-28 NOTE — Assessment & Plan Note (Signed)
Controlled.  

## 2012-12-28 NOTE — Patient Instructions (Addendum)
A chest x-ray takes a picture of the organs and structures inside the chest, including the heart, lungs, and blood vessels. This test can show several things, including, whether the heart is enlarges; whether fluid is building up in the lungs; and whether pacemaker / defibrillator leads are still in place.  Your physician recommends that you return for lab work in: today  Your physician has requested that you have an echocardiogram. Echocardiography is a painless test that uses sound waves to create images of your heart. It provides your doctor with information about the size and shape of your heart and how well your heart's chambers and valves are working. This procedure takes approximately one hour. There are no restrictions for this procedure.  Your physician has recommended you make the following change in your medication: start taking Furosemide 40 mg daily and Potassium 20 mcq. Daily  Your physician recommends that you schedule a follow-up appointment in: 2 weeks with Jacolyn Reedy, PA-C and in 2 months with Dr. Excell Seltzer

## 2012-12-29 ENCOUNTER — Ambulatory Visit (HOSPITAL_COMMUNITY): Payer: Medicare Other | Attending: Cardiology | Admitting: Radiology

## 2012-12-29 DIAGNOSIS — R0609 Other forms of dyspnea: Secondary | ICD-10-CM

## 2012-12-29 DIAGNOSIS — I369 Nonrheumatic tricuspid valve disorder, unspecified: Secondary | ICD-10-CM | POA: Insufficient documentation

## 2012-12-29 DIAGNOSIS — I080 Rheumatic disorders of both mitral and aortic valves: Secondary | ICD-10-CM | POA: Insufficient documentation

## 2012-12-29 DIAGNOSIS — I4891 Unspecified atrial fibrillation: Secondary | ICD-10-CM | POA: Insufficient documentation

## 2012-12-29 DIAGNOSIS — I10 Essential (primary) hypertension: Secondary | ICD-10-CM | POA: Insufficient documentation

## 2012-12-29 NOTE — Progress Notes (Signed)
Echocardiogram performed.  

## 2012-12-30 ENCOUNTER — Telehealth: Payer: Self-pay | Admitting: Cardiovascular Disease

## 2012-12-30 NOTE — Telephone Encounter (Signed)
Walk In pt Form " Pt Dropped Off BP Readings he's Been Keeping" Sent to Lauren/Stuckey  12/30/12/KM

## 2013-01-04 ENCOUNTER — Other Ambulatory Visit: Payer: Self-pay

## 2013-01-04 DIAGNOSIS — R0609 Other forms of dyspnea: Secondary | ICD-10-CM

## 2013-01-05 ENCOUNTER — Other Ambulatory Visit (INDEPENDENT_AMBULATORY_CARE_PROVIDER_SITE_OTHER): Payer: Medicare Other

## 2013-01-05 DIAGNOSIS — R0989 Other specified symptoms and signs involving the circulatory and respiratory systems: Secondary | ICD-10-CM

## 2013-01-05 DIAGNOSIS — R0609 Other forms of dyspnea: Secondary | ICD-10-CM

## 2013-01-05 LAB — BASIC METABOLIC PANEL
Calcium: 9.2 mg/dL (ref 8.4–10.5)
Creatinine, Ser: 1.4 mg/dL (ref 0.4–1.5)
GFR: 49.56 mL/min — ABNORMAL LOW (ref >60.00)
Sodium: 139 mEq/L (ref 135–145)

## 2013-01-11 ENCOUNTER — Ambulatory Visit (INDEPENDENT_AMBULATORY_CARE_PROVIDER_SITE_OTHER): Payer: Medicare Other | Admitting: Physician Assistant

## 2013-01-11 ENCOUNTER — Encounter: Payer: Self-pay | Admitting: Physician Assistant

## 2013-01-11 ENCOUNTER — Other Ambulatory Visit: Payer: Self-pay | Admitting: *Deleted

## 2013-01-11 VITALS — BP 128/80 | HR 76 | Ht 67.0 in | Wt 217.0 lb

## 2013-01-11 DIAGNOSIS — W19XXXA Unspecified fall, initial encounter: Secondary | ICD-10-CM

## 2013-01-11 DIAGNOSIS — N289 Disorder of kidney and ureter, unspecified: Secondary | ICD-10-CM

## 2013-01-11 DIAGNOSIS — R0600 Dyspnea, unspecified: Secondary | ICD-10-CM

## 2013-01-11 DIAGNOSIS — R0989 Other specified symptoms and signs involving the circulatory and respiratory systems: Secondary | ICD-10-CM

## 2013-01-11 DIAGNOSIS — I10 Essential (primary) hypertension: Secondary | ICD-10-CM

## 2013-01-11 DIAGNOSIS — R0609 Other forms of dyspnea: Secondary | ICD-10-CM

## 2013-01-11 DIAGNOSIS — I4891 Unspecified atrial fibrillation: Secondary | ICD-10-CM

## 2013-01-11 LAB — BASIC METABOLIC PANEL
BUN: 22 mg/dL (ref 6–23)
Chloride: 102 mEq/L (ref 96–112)
GFR: 42.95 mL/min — ABNORMAL LOW (ref >60.00)
Potassium: 3.8 mEq/L (ref 3.5–5.1)
Sodium: 138 mEq/L (ref 135–145)

## 2013-01-11 MED ORDER — FUROSEMIDE 40 MG PO TABS: 20.0000 mg | ORAL_TABLET | Freq: Every day | ORAL | Status: DC

## 2013-01-11 MED ORDER — POTASSIUM CHLORIDE CRYS ER 20 MEQ PO TBCR: 10.0000 meq | EXTENDED_RELEASE_TABLET | Freq: Every day | ORAL | Status: DC

## 2013-01-11 NOTE — Assessment & Plan Note (Signed)
Chronic, rate controlled, on Xarelto 

## 2013-01-11 NOTE — Assessment & Plan Note (Addendum)
Improved on Lasix. BNP was elevated the patient did have rails and edema. He had LVH on 2-D echo.He feels much better on the Lasix. I will continue the Lasix for now and keep a close eye on his renal function. We will recheck today. He will see Dr. Excell Seltzer back in one month.

## 2013-01-11 NOTE — Patient Instructions (Addendum)
Your physician recommends that you continue on your current medications as directed. Please refer to the Current Medication list given to you today.  Your physician recommends that you return for lab work in: today  Your physician recommends that you schedule a follow-up appointment in: 1 month    

## 2013-01-11 NOTE — Assessment & Plan Note (Signed)
Patient has had no further falls since his last office visit.

## 2013-01-11 NOTE — Assessment & Plan Note (Signed)
controlled 

## 2013-01-11 NOTE — Progress Notes (Signed)
HPI:  This is an 79 year old gentleman patient of Dr. Excell Seltzer who is here for two-week followup. He has chronic atrial for ablation, hypertension, obesity and is anticoagulated with Xarelto.  When I saw him 2 weeks ago he was complaining of dyspnea on exertion orthopnea and leg edema. I ordered 2-D echo which showed mild LVH EF 55-60% and it was not adequate to evaluate LV diastolic dysfunction. He had mild aortic regurgitation. BNP was only 50. CXR was clear.I placed him on Lasix 40 mg daily as well as potassium 20 milliequivalents daily. He feels much better. He denies any orthopnea or leg edema. He does have chronic dyspnea on exertion but it is a little better. His renal function actually improved with the diuretic. His creatinine was 1.4 and GFR 49. We will have to watch this closely on Lasix.   Allergies: -- Sulfa Antibiotics -- Other (See Comments)   --  Unknown reaction  Current Outpatient Prescriptions on File Prior to Visit: alfuzosin (UROXATRAL) 10 MG 24 hr tablet, Take 10 mg by mouth daily., Disp: , Rfl:  Cholecalciferol (VITAMIN D) 1000 UNITS capsule, Take 1,000 Units by mouth at bedtime. , Disp: , Rfl:  digoxin (LANOXIN) 0.125 MG tablet, TAKE 1 TABLET BY MOUTH EVERY DAY, Disp: 30 tablet, Rfl: 6 donepezil (ARICEPT) 5 MG tablet, Take 5 mg by mouth at bedtime as needed., Disp: , Rfl:  finasteride (PROSCAR) 5 MG tablet, Take 5 mg by mouth daily., Disp: , Rfl:  furosemide (LASIX) 40 MG tablet, Take 1 tablet (40 mg total) by mouth daily., Disp: 30 tablet, Rfl: 3 metoprolol (TOPROL-XL) 50 MG 24 hr tablet, Take 50 mg by mouth at bedtime. , Disp: , Rfl:  Multiple Vitamin (MULITIVITAMIN WITH MINERALS) TABS, Take 1 tablet by mouth at bedtime., Disp: , Rfl:  niacin (NIASPAN) 500 MG CR tablet, Take 500 mg by mouth at bedtime.  , Disp: , Rfl:  pantoprazole (PROTONIX) 40 MG tablet, Take 40 mg by mouth every evening. , Disp: , Rfl:  potassium chloride SA (K-DUR,KLOR-CON) 20 MEQ tablet, Take 1 tablet  (20 mEq total) by mouth daily., Disp: 30 tablet, Rfl: 3 Rivaroxaban (XARELTO) 15 MG TABS tablet, Take 1 tablet (15 mg total) by mouth daily., Disp: 1 tablet, Rfl: 0 vitamin C (ASCORBIC ACID) 500 MG tablet, Take 500 mg by mouth at bedtime. , Disp: , Rfl:  hyoscyamine (LEVSIN, ANASPAZ) 0.125 MG tablet, Take 1 tablet (0.125 mg total) by mouth every 4 (four) hours as needed for cramping (bladder spasms)., Disp: 40 tablet, Rfl: 4    Past Medical History:   Hypertension                                                 Hyperlipidemia                                               Chronic atrial fibrillation                                  Urinary retention  foley las*   Arthritis                                                    Sleep apnea                                                    Comment:no cpap used, sleep study was several yrs ago  Past Surgical History:   BACK SURGERY                                    yrs ago        Comment:lower back surgery   TONSILLECTOMY                                   as child     PROSTATE SURGERY                                yrs ago, *   TRANSURETHRAL RESECTION OF PROSTATE             02/22/2012       Comment:Procedure: TRANSURETHRAL RESECTION OF THE               PROSTATE WITH GYRUS INSTRUMENTS;  Surgeon:               Milford Cage, MD;  Location: [facility];                Service: Urology;  Laterality: N/A;    No family history on file.   Social History   Marital Status: Married             Spouse Name:                      Years of Education:                 Number of children:             Occupational History   None on file  Social History Main Topics   Smoking Status: Former Smoker                   Packs/Day: .5    Years: 50        Types: Cigarettes   Smokeless Status: Never Used                       Alcohol Use: Yes               Comment: rare use   Drug Use: No             Sexual Activity: Not on  file        Other Topics            Concern   None on file  Social History Narrative   None on file    ROS: See history of present illness otherwise negative  PHYSICAL EXAM:Obese, in no acute distress. Neck: No JVD, HJR, Bruit, or thyroid enlargement  Lungs: decreased breath sounds but clear,No tachypnea, clear without wheezing, rales, or rhonchi  Cardiovascular: irregular irregular, PMI not displaced, tour 6 systolic murmur at the left sternal border,no gallops, bruit, thrill, or heave.  Abdomen: BS normal. Soft without organomegaly, masses, lesions or tenderness.  Extremities: edema improved, varicosities and brawny changes. Decreased distal pulses bilateral  SKin: Warm, no lesions or rashes   Musculoskeletal: No deformities  Neuro: no focal signs  BP 128/80  Pulse 76  Ht 5\' 7"  (1.702 m)  Wt 217 lb (98.431 kg)  BMI 33.99 kg/m2  2Decho: 12/29/12: Study Conclusions  - Left ventricle: The cavity size was normal. Wall thickness   was increased in a pattern of mild LVH. There was mild   focal basal hypertrophy of the septum. Systolic function   was normal. The estimated ejection fraction was in the   range of 55% to 60%. Wall motion was normal; there were no   regional wall motion abnormalities. The study is not   technically sufficient to allow evaluation of LV diastolic   function. - Aortic valve: Mild regurgitation. - Left atrium: The atrium was mildly dilated.    CXR:12/28/12: IMPRESSION: No acute cardiopulmonary abnormality and no interval change.

## 2013-01-18 ENCOUNTER — Other Ambulatory Visit (INDEPENDENT_AMBULATORY_CARE_PROVIDER_SITE_OTHER): Payer: Medicare Other

## 2013-01-18 DIAGNOSIS — N289 Disorder of kidney and ureter, unspecified: Secondary | ICD-10-CM

## 2013-01-18 LAB — BASIC METABOLIC PANEL
CO2: 30 mEq/L (ref 19–32)
Calcium: 9.6 mg/dL (ref 8.4–10.5)
Potassium: 4 mEq/L (ref 3.5–5.1)
Sodium: 139 mEq/L (ref 135–145)

## 2013-01-25 ENCOUNTER — Telehealth: Payer: Self-pay | Admitting: *Deleted

## 2013-01-25 DIAGNOSIS — R0609 Other forms of dyspnea: Secondary | ICD-10-CM

## 2013-01-25 DIAGNOSIS — R0989 Other specified symptoms and signs involving the circulatory and respiratory systems: Secondary | ICD-10-CM

## 2013-01-25 NOTE — Telephone Encounter (Signed)
Message copied by Barrie Folk on Wed Jan 25, 2013 11:30 AM ------      Message from: Prescott Gum      Created: Wed Jan 25, 2013  7:50 AM       Repeat bmet 2 weeks

## 2013-01-25 NOTE — Telephone Encounter (Signed)
I spoke with wife about lab results. She states husband is urinating quite frequently after his surgery & has periods of incontinence which are not new. Reassurance given. Appt made for repeat bmp on 02/07/13 Mylo Red RN

## 2013-02-07 ENCOUNTER — Other Ambulatory Visit (INDEPENDENT_AMBULATORY_CARE_PROVIDER_SITE_OTHER): Payer: Medicare Other

## 2013-02-07 ENCOUNTER — Telehealth: Payer: Self-pay | Admitting: Cardiovascular Disease

## 2013-02-07 DIAGNOSIS — R0989 Other specified symptoms and signs involving the circulatory and respiratory systems: Secondary | ICD-10-CM

## 2013-02-07 DIAGNOSIS — R0609 Other forms of dyspnea: Secondary | ICD-10-CM

## 2013-02-07 LAB — BASIC METABOLIC PANEL
BUN: 20 mg/dL (ref 6–23)
CO2: 30 mEq/L (ref 19–32)
Chloride: 103 mEq/L (ref 96–112)
Potassium: 4.1 mEq/L (ref 3.5–5.1)

## 2013-02-07 NOTE — Telephone Encounter (Signed)
Or cell [phone], Pt's wife calling re lab work , wonders why he is coming in so often

## 2013-02-07 NOTE — Telephone Encounter (Signed)
I spoke with the pt's wife and made her aware of BMP results. I will forward this message to Dr Excell Seltzer to review the pt's lab results and make further recommendations if needed.

## 2013-02-08 NOTE — Telephone Encounter (Signed)
Notes Recorded by Dyann Kief, PA on 02/08/2013 at 8:06 AM Make sure patient has decreased his lasix to 20mg  daily. bmet in 2 weeks.  I spoke with the pt's wife and confirmed that the pt is only taking 20mg  of lasix daily.  The pt will have BMP rechecked at 02/24/13 office visit.

## 2013-02-24 ENCOUNTER — Ambulatory Visit: Payer: Medicare Other | Admitting: Cardiovascular Disease

## 2013-02-28 ENCOUNTER — Ambulatory Visit: Payer: Medicare Other | Admitting: Cardiovascular Disease

## 2013-03-02 ENCOUNTER — Ambulatory Visit (INDEPENDENT_AMBULATORY_CARE_PROVIDER_SITE_OTHER): Payer: Medicare Other | Admitting: Cardiovascular Disease

## 2013-03-02 ENCOUNTER — Encounter: Payer: Self-pay | Admitting: Cardiovascular Disease

## 2013-03-02 VITALS — BP 122/74 | HR 93 | Ht 67.0 in | Wt 216.8 lb

## 2013-03-02 DIAGNOSIS — I4891 Unspecified atrial fibrillation: Secondary | ICD-10-CM

## 2013-03-02 LAB — BASIC METABOLIC PANEL WITH GFR
BUN: 18 mg/dL (ref 6–23)
CO2: 23 meq/L (ref 19–32)
Calcium: 9.1 mg/dL (ref 8.4–10.5)
Chloride: 104 meq/L (ref 96–112)
Creatinine, Ser: 1.5 mg/dL (ref 0.4–1.5)
GFR: 46.19 mL/min — ABNORMAL LOW
Glucose, Bld: 127 mg/dL — ABNORMAL HIGH (ref 70–99)
Potassium: 3.9 meq/L (ref 3.5–5.1)
Sodium: 138 meq/L (ref 135–145)

## 2013-03-02 MED ORDER — FUROSEMIDE 40 MG PO TABS: 40.0000 mg | ORAL_TABLET | Freq: Every day | ORAL | Status: DC | PRN

## 2013-03-02 NOTE — Progress Notes (Signed)
HPI:  79 year old [name] [name] presented for followup evaluation. He has permanent atrial fibrillation, hypertension, obesity, and diastolic heart failure. He is extremely sedentary and has been for many years. He has chronic dyspnea with exertion. He was seen a few months ago by Herma Carson, PA-C, and she adjusted his diuretic regimen. He initially appreciated some improvement in his breathing, but now is back to baseline. He is having problems with urinary incontinence and gait instability as well. He has not had any recent falls. He denies orthopnea, PND, or chest pain. He has mild chronic leg swelling.  Outpatient Encounter Prescriptions as of 03/02/2013  Medication Sig Dispense Refill  . alfuzosin (UROXATRAL) 10 MG 24 hr tablet Take 10 mg by mouth daily.      . Cholecalciferol (VITAMIN D) 1000 UNITS capsule Take 1,000 Units by mouth at bedtime.       . digoxin (LANOXIN) 0.125 MG tablet TAKE 1 TABLET BY MOUTH EVERY DAY  30 tablet  6  . donepezil (ARICEPT) 5 MG tablet Take 5 mg by mouth at bedtime as needed.      . finasteride (PROSCAR) 5 MG tablet Take 5 mg by mouth daily.      . furosemide (LASIX) 40 MG tablet Take 1 tablet (40 mg total) by mouth daily as needed (for leg swelling).  15 tablet  3  . metoprolol (TOPROL-XL) 50 MG 24 hr tablet Take 50 mg by mouth at bedtime.       . Multiple Vitamin (MULITIVITAMIN WITH MINERALS) TABS Take 1 tablet by mouth at bedtime.      . niacin (NIASPAN) 500 MG CR tablet Take 500 mg by mouth at bedtime.        . pantoprazole (PROTONIX) 40 MG tablet Take 40 mg by mouth every evening.       . potassium chloride SA (K-DUR,KLOR-CON) 20 MEQ tablet Take 0.5 tablets (10 mEq total) by mouth daily.  30 tablet  3  . Rivaroxaban (XARELTO) 15 MG TABS tablet Take 1 tablet (15 mg total) by mouth daily.  1 tablet  0  . vitamin C (ASCORBIC ACID) 500 MG tablet Take 500 mg by mouth at bedtime.       . [DISCONTINUED] furosemide (LASIX) 40 MG tablet Take 0.5 tablets (20 mg total) by  mouth daily.  30 tablet  3  . hyoscyamine (LEVSIN, ANASPAZ) 0.125 MG tablet Take 1 tablet (0.125 mg total) by mouth every 4 (four) hours as needed for cramping (bladder spasms).  40 tablet  4   No facility-administered encounter medications on file as of 03/02/2013.    Allergies  Allergen Reactions  . Sulfa Antibiotics Other (See Comments)    Unknown reaction    Past Medical History  Diagnosis Date  . Hypertension   . Hyperlipidemia   . Chronic atrial fibrillation   . Urinary retention foley last changed 3 weeks ago  . Arthritis   . Sleep apnea     no cpap used, sleep study was several yrs ago    ROS: Negative except as per HPI  BP 122/74  Pulse 93  Ht 5\' 7"  (1.702 m)  Wt 98.34 kg (216 lb 12.8 oz)  BMI 33.95 kg/m2  PHYSICAL EXAM: Pt is alert and oriented, elderly chronically ill-appearing male in NAD HEENT: normal Neck: JVP - normal Lungs: Scattered rhonchi throughout CV: Irregularly irregular, distant heart sounds Abd: soft, NT, Positive BS, no hepatomegaly Ext: Trace pretibial edema bilaterally, distal pulses intact and equal Skin: warm/dry no rash  EKG:  Atrial fibrillation 93 beats per minute, septal infarct age undetermined  ASSESSMENT AND PLAN: 1. Permanent atrial fibrillation. Recommend continue current medical therapy which includes digoxin and metoprolol for rate control and Xarelto for anticoagulation.  2. Chronic diastolic heart failure. His dyspnea on exertion is unchanged. Even when he was having more difficulty with breathing, his BNP and chest x-ray were unrevealing. He's having more trouble with urinary incontinence since he has been taking daily Lasix. I have recommended that he use Lasix only on an as-needed basis for leg swelling. I do not think he will be compliant with daily weights. He will take Lasix 40 mg when needed.  3. Chronic kidney disease, stage III. Creatinine was increased last month on diuretics. Will repeat today.  Tonny Bollman 03/02/2013 1:58 PM

## 2013-03-02 NOTE — Patient Instructions (Addendum)
Your physician wants you to follow-up in:  6 months.  You will receive a reminder letter in the mail two months in advance. If you don't receive a letter, please call our office to schedule the follow-up appointment.  Your physician has recommended you make the following change in your medication -- Stop daily dose of furosemide.  Take furosemide 40 mg by mouth daily as needed for leg swelling.

## 2013-03-03 LAB — DIGOXIN LEVEL: Digoxin Level: <0.1 ng/mL — ABNORMAL LOW (ref 0.8–2.0)

## 2013-05-25 ENCOUNTER — Encounter (HOSPITAL_COMMUNITY): Payer: Self-pay | Admitting: Emergency Medicine

## 2013-05-25 ENCOUNTER — Emergency Department (HOSPITAL_COMMUNITY): Payer: Medicare Other

## 2013-05-25 ENCOUNTER — Emergency Department (HOSPITAL_COMMUNITY)
Admission: EM | Admit: 2013-05-25 | Discharge: 2013-05-25 | Disposition: A | Discharge status: Home / Self Care | Payer: Medicare Other | Attending: Emergency Medicine | Admitting: Emergency Medicine

## 2013-05-25 DIAGNOSIS — W19XXXA Unspecified fall, initial encounter: Secondary | ICD-10-CM

## 2013-05-25 DIAGNOSIS — Z8669 Personal history of other diseases of the nervous system and sense organs: Secondary | ICD-10-CM | POA: Insufficient documentation

## 2013-05-25 DIAGNOSIS — M129 Arthropathy, unspecified: Secondary | ICD-10-CM | POA: Insufficient documentation

## 2013-05-25 DIAGNOSIS — Y9229 Other specified public building as the place of occurrence of the external cause: Secondary | ICD-10-CM | POA: Insufficient documentation

## 2013-05-25 DIAGNOSIS — Z87891 Personal history of nicotine dependence: Secondary | ICD-10-CM | POA: Insufficient documentation

## 2013-05-25 DIAGNOSIS — W1809XA Striking against other object with subsequent fall, initial encounter: Secondary | ICD-10-CM | POA: Insufficient documentation

## 2013-05-25 DIAGNOSIS — I4891 Unspecified atrial fibrillation: Secondary | ICD-10-CM | POA: Insufficient documentation

## 2013-05-25 DIAGNOSIS — I10 Essential (primary) hypertension: Secondary | ICD-10-CM | POA: Insufficient documentation

## 2013-05-25 DIAGNOSIS — Y9301 Activity, walking, marching and hiking: Secondary | ICD-10-CM | POA: Insufficient documentation

## 2013-05-25 DIAGNOSIS — S0990XA Unspecified injury of head, initial encounter: Secondary | ICD-10-CM | POA: Insufficient documentation

## 2013-05-25 DIAGNOSIS — Z87448 Personal history of other diseases of urinary system: Secondary | ICD-10-CM | POA: Insufficient documentation

## 2013-05-25 DIAGNOSIS — E785 Hyperlipidemia, unspecified: Secondary | ICD-10-CM | POA: Insufficient documentation

## 2013-05-25 DIAGNOSIS — Z79899 Other long term (current) drug therapy: Secondary | ICD-10-CM | POA: Insufficient documentation

## 2013-05-25 LAB — CBC WITH DIFFERENTIAL/PLATELET
Eosinophils Absolute: 0.5 10*3/uL (ref 0.0–0.7)
Eosinophils Relative: 5 % (ref 0–5)
Hemoglobin: 14.9 g/dL (ref 13.0–17.0)
Lymphs Abs: 1.4 10*3/uL (ref 0.7–4.0)
MCH: 33.3 pg (ref 26.0–34.0)
MCV: 94 fL (ref 78.0–100.0)
Monocytes Relative: 7 % (ref 3–12)
RBC: 4.48 MIL/uL (ref 4.22–5.81)

## 2013-05-25 LAB — APTT: aPTT: 31 seconds (ref 24–37)

## 2013-05-25 LAB — PROTIME-INR: Prothrombin Time: 13.3 seconds (ref 11.6–15.2)

## 2013-05-25 LAB — COMPREHENSIVE METABOLIC PANEL
BUN: 18 mg/dL (ref 6–23)
Calcium: 9.5 mg/dL (ref 8.4–10.5)
Creatinine, Ser: 1.46 mg/dL — ABNORMAL HIGH (ref 0.50–1.35)
GFR calc Af Amer: 49 mL/min — ABNORMAL LOW (ref >90)
Glucose, Bld: 151 mg/dL — ABNORMAL HIGH (ref 70–99)
Total Protein: 7.6 g/dL (ref 6.0–8.3)

## 2013-05-25 NOTE — ED Notes (Signed)
Pt tripped and fell today hitting head; pt no syncopal episode but then after going in restaurant and eating pt could not remember what happened when he fell; pt taking xeralto for afib until 2 weeks ago

## 2013-05-25 NOTE — ED Provider Notes (Signed)
History     CSN: [id number]  Arrival date & time 05/25/13  1447   First MD Initiated Contact with Patient 05/25/13 1603      Chief Complaint  Patient presents with  . Fall    (Consider location/radiation/quality/duration/timing/severity/associated sxs/prior treatment) Patient is a 79 y.o. male presenting with fall. The history is provided by the patient and the spouse.  Fall Pertinent negatives include no chest pain, no abdominal pain, no headaches and no shortness of breath.  patient status post a fall he fell backward while going into a restaurant in the back of his head on a bumper of a car the car was not moving. There was no loss of consciousness. Was not a syncopal episode. Patient needs some assistance to get back on his feet but then went into the restaurant and had lunch. Patient then could not remember what happened so brought in by wife for further evaluation. Patient has taken is xalrato but stopped it 2 weeks ago. This was for atrial fib.patient without any specific complaints.  Past Medical History  Diagnosis Date  . Hypertension   . Hyperlipidemia   . Chronic atrial fibrillation   . Urinary retention foley last changed 3 weeks ago  . Arthritis   . Sleep apnea     no cpap used, sleep study was several yrs ago    Past Surgical History  Procedure Laterality Date  . Back surgery  yrs ago    lower back surgery  . Tonsillectomy  as child  . Prostate surgery  yrs ago, no cancer  . Transurethral resection of prostate  02/22/2012    Procedure: TRANSURETHRAL RESECTION OF THE PROSTATE WITH GYRUS INSTRUMENTS;  Surgeon: Milford Cage, MD;  Location: [facility];  Service: Urology;  Laterality: N/A;        History reviewed. No pertinent family history.  History  Substance Use Topics  . Smoking status: Former Smoker -- 0.50 packs/day for 50 years    Types: Cigarettes  . Smokeless tobacco: Never Used  . Alcohol Use: Yes     Comment: rare use      Review of  Systems  Constitutional: Negative for fever and fatigue.  HENT: Negative for neck pain.   Eyes: Negative for redness.  Respiratory: Negative for shortness of breath.   Cardiovascular: Negative for chest pain.  Gastrointestinal: Negative for nausea, vomiting and abdominal pain.  Genitourinary: Negative for dysuria.  Musculoskeletal: Negative for back pain.  Skin: Negative for wound.  Neurological: Negative for syncope and headaches.  Psychiatric/Behavioral: Negative for confusion.    Allergies  Sulfa antibiotics  Home Medications   Current Outpatient Rx  Name  Route  Sig  Dispense  Refill  . alfuzosin (UROXATRAL) 10 MG 24 hr tablet   Oral   Take 10 mg by mouth daily.         . Cholecalciferol (VITAMIN D) 1000 UNITS capsule   Oral   Take 1,000 Units by mouth at bedtime.          . digoxin (LANOXIN) 0.125 MG tablet      TAKE 1 TABLET BY MOUTH EVERY DAY   30 tablet   6   . finasteride (PROSCAR) 5 MG tablet   Oral   Take 5 mg by mouth daily.         . metoprolol (TOPROL-XL) 50 MG 24 hr tablet   Oral   Take 50 mg by mouth at bedtime.          Marland Kitchen  Multiple Vitamin (MULITIVITAMIN WITH MINERALS) TABS   Oral   Take 1 tablet by mouth at bedtime.         . niacin (NIASPAN) 500 MG CR tablet   Oral   Take 500 mg by mouth at bedtime.           . pantoprazole (PROTONIX) 40 MG tablet   Oral   Take 40 mg by mouth every evening.          . Rivaroxaban (XARELTO) 15 MG TABS tablet   Oral   Take 1 tablet (15 mg total) by mouth daily.   1 tablet   0   . vitamin C (ASCORBIC ACID) 500 MG tablet   Oral   Take 500 mg by mouth at bedtime.            BP 133/66  Pulse 94  Temp(Src) 98.9 F (37.2 C) (Oral)  Resp 18  SpO2 95%  Physical Exam  Constitutional: He appears well-developed and well-nourished. No distress.  HENT:  Head: Normocephalic and atraumatic.  Mouth/Throat: Oropharynx is clear and moist.  Eyes: Conjunctivae and EOM are normal. Pupils are  equal, round, and reactive to light.  Neck: Normal range of motion. Neck supple.  Cardiovascular: Normal rate, regular rhythm and normal heart sounds.   Pulmonary/Chest: Effort normal and breath sounds normal. No respiratory distress.  Abdominal: Soft. Bowel sounds are normal. There is no tenderness.  Musculoskeletal: Normal range of motion. He exhibits no tenderness.  Neurological: He is alert. No cranial nerve deficit. He exhibits normal muscle tone. Coordination normal.  Skin: Skin is warm.    ED Course  Procedures (including critical care time)  Labs Reviewed  COMPREHENSIVE METABOLIC PANEL - Abnormal; Notable for the following:    Glucose, Bld 151 (*)    Creatinine, Ser 1.46 (*)    AST 39 (*)    GFR calc non Af Amer 42 (*)    GFR calc Af Amer 49 (*)    All other components within normal limits  CBC WITH DIFFERENTIAL  PROTIME-INR  APTT   Ct Head Wo Contrast  05/25/2013   *RADIOLOGY REPORT*  Clinical Data:  Fall.  Syncope.  CT HEAD WITHOUT CONTRAST CT CERVICAL SPINE WITHOUT CONTRAST  Technique:  Multidetector CT imaging of the head and cervical spine was performed following the standard protocol without intravenous contrast.  Multiplanar CT image reconstructions of the cervical spine were also generated.  Comparison:  05/13/2007.  CT HEAD  Findings: Calvarium intact.  Intracranial atherosclerosis. No mass lesion, mass effect, midline shift, hydrocephalus, hemorrhage.  No acute territorial cortical ischemia/infarct. Atrophy and chronic ischemic white matter disease is present.  IMPRESSION: Atrophy and chronic ischemic white matter disease without acute intracranial abnormality.  CT CERVICAL SPINE  Findings: Multilevel cervical spondylosis is present. Alignment within normal limits allowing for degenerative tissue disease. Craniocervical junction is within normal limits.  C3-C4 predominant degenerative disc disease with broad-based disc osteophyte complex that produces at least moderate  central stenosis.  9.6 mm left thyroid lobe nodule. Sub-centimeter thyroid nodule(s) noted, too small to characterize, but most likely benign in the absence of known clinical risk factors for thyroid carcinoma.  There is a 13 mm nodule in the anterior mediastinum (image number 71 series 5).  This is incompletely visualized.  Follow-up chest CT, preferably with infusion recommended for further evaluation. Adenopathy cannot be excluded.  No definite cervical adenopathy is visualized.  Carotid atherosclerosis.  IMPRESSION: 1.  No cervical spine fracture or dislocation.  Severe cervical spondylosis with moderate central stenosis at C3-C4. Multilevel foraminal stenosis associated with uncovertebral spurring and facet disease. 2.  13 mm nodule in the anterior mediastinum.  Follow-up chest CT recommended, preferably with infusion for further evaluation.  This is only partially visualized.   Original Report Authenticated By: Andreas Newport, M.D.   Ct Cervical Spine Wo Contrast  05/25/2013   *RADIOLOGY REPORT*  Clinical Data:  Fall.  Syncope.  CT HEAD WITHOUT CONTRAST CT CERVICAL SPINE WITHOUT CONTRAST  Technique:  Multidetector CT imaging of the head and cervical spine was performed following the standard protocol without intravenous contrast.  Multiplanar CT image reconstructions of the cervical spine were also generated.  Comparison:  05/13/2007.  CT HEAD  Findings: Calvarium intact.  Intracranial atherosclerosis. No mass lesion, mass effect, midline shift, hydrocephalus, hemorrhage.  No acute territorial cortical ischemia/infarct. Atrophy and chronic ischemic white matter disease is present.  IMPRESSION: Atrophy and chronic ischemic white matter disease without acute intracranial abnormality.  CT CERVICAL SPINE  Findings: Multilevel cervical spondylosis is present. Alignment within normal limits allowing for degenerative tissue disease. Craniocervical junction is within normal limits.  C3-C4 predominant degenerative  disc disease with broad-based disc osteophyte complex that produces at least moderate central stenosis.  9.6 mm left thyroid lobe nodule. Sub-centimeter thyroid nodule(s) noted, too small to characterize, but most likely benign in the absence of known clinical risk factors for thyroid carcinoma.  There is a 13 mm nodule in the anterior mediastinum (image number 71 series 5).  This is incompletely visualized.  Follow-up chest CT, preferably with infusion recommended for further evaluation. Adenopathy cannot be excluded.  No definite cervical adenopathy is visualized.  Carotid atherosclerosis.  IMPRESSION: 1.  No cervical spine fracture or dislocation.  Severe cervical spondylosis with moderate central stenosis at C3-C4. Multilevel foraminal stenosis associated with uncovertebral spurring and facet disease. 2.  13 mm nodule in the anterior mediastinum.  Follow-up chest CT recommended, preferably with infusion for further evaluation.  This is only partially visualized.   Original Report Authenticated By: Andreas Newport, M.D.   Results for orders placed during the hospital encounter of 05/25/13  CBC WITH DIFFERENTIAL      Result Value Range   WBC 8.4  4.0 - 10.5 K/uL   RBC 4.48  4.22 - 5.81 MIL/uL   Hemoglobin 14.9  13.0 - 17.0 g/dL   HCT 45.4  09.8 - 11.9 %   MCV 94.0  78.0 - 100.0 fL   MCH 33.3  26.0 - 34.0 pg   MCHC 35.4  30.0 - 36.0 g/dL   RDW 14.7  82.9 - 56.2 %   Platelets 197  150 - 400 K/uL   Neutrophils Relative % 70  43 - 77 %   Neutro Abs 5.9  1.7 - 7.7 K/uL   Lymphocytes Relative 17  12 - 46 %   Lymphs Abs 1.4  0.7 - 4.0 K/uL   Monocytes Relative 7  3 - 12 %   Monocytes Absolute 0.6  0.1 - 1.0 K/uL   Eosinophils Relative 5  0 - 5 %   Eosinophils Absolute 0.5  0.0 - 0.7 K/uL   Basophils Relative 0  0 - 1 %   Basophils Absolute 0.0  0.0 - 0.1 K/uL  COMPREHENSIVE METABOLIC PANEL      Result Value Range   Sodium 139  135 - 145 mEq/L   Potassium 4.4  3.5 - 5.1 mEq/L   Chloride 101  96  - 112 mEq/L  CO2 30  19 - 32 mEq/L   Glucose, Bld 151 (*) 70 - 99 mg/dL   BUN 18  6 - 23 mg/dL   Creatinine, Ser 4.09 (*) 0.50 - 1.35 mg/dL   Calcium 9.5  8.4 - 81.1 mg/dL   Total Protein 7.6  6.0 - 8.3 g/dL   Albumin 3.8  3.5 - 5.2 g/dL   AST 39 (*) 0 - 37 U/L   ALT 28  0 - 53 U/L   Alkaline Phosphatase 84  39 - 117 U/L   Total Bilirubin 0.4  0.3 - 1.2 mg/dL   GFR calc non Af Amer 42 (*) >90 mL/min   GFR calc Af Amer 49 (*) >90 mL/min  PROTIME-INR      Result Value Range   Prothrombin Time 13.3  11.6 - 15.2 seconds   INR 1.02  0.00 - 1.49  APTT      Result Value Range   aPTT 31  24 - 37 seconds     1. Fall, initial encounter   2. Head injury, initial encounter       MDM  Patient fell backwards while going into a restaurant in the back of his head on the bumper of a car. Car was not moving. No loss of consciousness. Patient needs some help to get up on his feet went on to continue going into the restaurant and had lunch. Then they noticed that he wasn't able to remember what happened patient is on several for atrial fib but it was stopped 2 weeks ago. Workup here in the emergency department is negative for any significant head or neck injury. Patient neurologically is intact and alert no complaint of hip pain or leg pain no other injuries. CT did pick up concern for nodule in the anterior mediastinum this information passed on to the patient and his wife and followup with her primary care Dr. Is recommended for further evaluation. It will be important that this is evaluated.       Shelda Jakes, MD 05/25/13 [phone]

## 2013-05-25 NOTE — ED Notes (Signed)
Pt reports falling today in parking lot and hitting head on another car. Wife states pt was responding after fall. Denies pain. Has been off of blood thinner for two weeks. Alert and oriented at this time, but states he does not remember the fall.

## 2013-08-29 ENCOUNTER — Encounter: Payer: Self-pay | Admitting: Cardiovascular Disease

## 2013-08-29 ENCOUNTER — Ambulatory Visit (INDEPENDENT_AMBULATORY_CARE_PROVIDER_SITE_OTHER): Payer: Medicare Other | Admitting: Cardiovascular Disease

## 2013-08-29 VITALS — BP 132/82 | HR 88 | Ht 67.0 in | Wt 223.0 lb

## 2013-08-29 DIAGNOSIS — I4891 Unspecified atrial fibrillation: Secondary | ICD-10-CM

## 2013-08-29 NOTE — Progress Notes (Signed)
   HPI:  79 year old gentleman returning for followup evaluation. The patient is followed for permanent atrial fibrillation, hypertension, obesity, and diastolic heart failure. He remains extremely sedentary. The patient has chronic dyspnea with exertion, unchanged over time. He has quit taking all of his medications. He really does not give a reason for this. He complains of mild leg swelling. No chest pain, chest pressure, or syncope. He denies palpitations.  Outpatient Encounter Prescriptions as of 08/29/2013  Medication Sig Dispense Refill  . alfuzosin (UROXATRAL) 10 MG 24 hr tablet Take 10 mg by mouth daily.      . Cholecalciferol (VITAMIN D) 1000 UNITS capsule Take 1,000 Units by mouth at bedtime.       . digoxin (LANOXIN) 0.125 MG tablet TAKE 1 TABLET BY MOUTH EVERY DAY  30 tablet  6  . finasteride (PROSCAR) 5 MG tablet Take 5 mg by mouth daily.      . metoprolol (TOPROL-XL) 50 MG 24 hr tablet Take 50 mg by mouth at bedtime.       . Multiple Vitamin (MULITIVITAMIN WITH MINERALS) TABS Take 1 tablet by mouth at bedtime.      . niacin (NIASPAN) 500 MG CR tablet Take 500 mg by mouth at bedtime.        . pantoprazole (PROTONIX) 40 MG tablet Take 40 mg by mouth every evening.       . Rivaroxaban (XARELTO) 15 MG TABS tablet Take 1 tablet (15 mg total) by mouth daily.  1 tablet  0  . vitamin C (ASCORBIC ACID) 500 MG tablet Take 500 mg by mouth at bedtime.        No facility-administered encounter medications on file as of 08/29/2013.    Allergies  Allergen Reactions  . Sulfa Antibiotics Swelling    hands    Past Medical History  Diagnosis Date  . Hypertension   . Hyperlipidemia   . Chronic atrial fibrillation   . Urinary retention foley last changed 3 weeks ago  . Arthritis   . Sleep apnea     no cpap used, sleep study was several yrs ago    ROS: Negative except as per HPI  BP 132/82  Pulse 88  Ht 5\' 7"  (1.702 m)  Wt 223 lb (101.152 kg)  BMI 34.92 kg/m2  PHYSICAL EXAM: Pt is  alert and oriented, chronically ill-appearing elderly male in NAD HEENT: normal Neck: JVP - normal, carotids 2+= without bruits Lungs: Scattered rhonchi throughout bilaterally CV: Irregularly irregular without murmur or gallop Abd: soft, NT, Positive BS, no hepatomegaly Ext: Trace pretibial edema bilaterally, chronic stasis changes, distal pulses intact and equal  EKG:  Atrial fibrillation 88 beats per minute, low-voltage QRS, otherwise within normal limits.  ASSESSMENT AND PLAN: 1. Atrial fibrillation, permanent. The patient has discontinued all of his medications. He is at high risk of thromboembolism considering his advanced age, hypertension, and chronic diastolic heart failure. He is willing to resume Xarelto for anticoagulation and metoprolol succinate. Otherwise I think we can keep his medical regimen simple. I have explained to him that discontinuation of his anticoagulant drug is not a good idea, as even in the short term there is increased risk of thromboembolism.  2. Chronic diastolic heart failure. Overall he appears stable. He has very little insight and is not going to change his habits at this point.  For followup I will see him back in 6 months   Tonny Bollman 08/29/2013 6:45 PM

## 2013-08-29 NOTE — Patient Instructions (Signed)
Your physician has recommended you make the following change in your medication:  1) Restart Xarelto 15 mg one tablet by mouth every evening. 2) Restart Toprol (metoprolol) 50 mg one tablet by mouth once daily.  Your physician wants you to follow-up in: 6 months with Dr. Excell Seltzer. You will receive a reminder letter in the mail two months in advance. If you don't receive a letter, please call our office to schedule the follow-up appointment.

## 2013-09-02 ENCOUNTER — Other Ambulatory Visit: Payer: Self-pay | Admitting: Cardiovascular Disease

## 2014-02-19 ENCOUNTER — Ambulatory Visit (INDEPENDENT_AMBULATORY_CARE_PROVIDER_SITE_OTHER): Payer: Medicare Other | Admitting: Cardiovascular Disease

## 2014-02-19 ENCOUNTER — Encounter: Payer: Self-pay | Admitting: Cardiovascular Disease

## 2014-02-19 VITALS — BP 124/80 | HR 91 | Ht 67.0 in | Wt 225.0 lb

## 2014-02-19 DIAGNOSIS — I5032 Chronic diastolic (congestive) heart failure: Secondary | ICD-10-CM

## 2014-02-19 DIAGNOSIS — I4891 Unspecified atrial fibrillation: Secondary | ICD-10-CM

## 2014-02-19 LAB — BASIC METABOLIC PANEL
BUN: 19 mg/dL (ref 6–23)
CALCIUM: 9.4 mg/dL (ref 8.4–10.5)
CO2: 30 mEq/L (ref 19–32)
CREATININE: 1.5 mg/dL (ref 0.4–1.5)
Chloride: 103 mEq/L (ref 96–112)
GFR: 45.74 mL/min — ABNORMAL LOW (ref >60.00)
Glucose, Bld: 106 mg/dL — ABNORMAL HIGH (ref 70–99)
Potassium: 3.6 mEq/L (ref 3.5–5.1)
Sodium: 139 mEq/L (ref 135–145)

## 2014-02-19 LAB — CBC WITH DIFFERENTIAL/PLATELET
BASOS PCT: 0.4 % (ref 0.0–3.0)
Basophils Absolute: 0 10*3/uL (ref 0.0–0.1)
EOS ABS: 0.3 10*3/uL (ref 0.0–0.7)
EOS PCT: 3.4 % (ref 0.0–5.0)
HCT: 43.4 % (ref 39.0–52.0)
Hemoglobin: 14.6 g/dL (ref 13.0–17.0)
LYMPHS PCT: 19.3 % (ref 12.0–46.0)
Lymphs Abs: 1.9 10*3/uL (ref 0.7–4.0)
MCHC: 33.6 g/dL (ref 30.0–36.0)
MCV: 97.5 fl (ref 78.0–100.0)
MONO ABS: 0.7 10*3/uL (ref 0.1–1.0)
Monocytes Relative: 7.5 % (ref 3.0–12.0)
NEUTROS PCT: 69.4 % (ref 43.0–77.0)
Neutro Abs: 6.9 10*3/uL (ref 1.4–7.7)
PLATELETS: 171 10*3/uL (ref 150.0–400.0)
RBC: 4.45 Mil/uL (ref 4.22–5.81)
RDW: 14.2 % (ref 11.5–14.6)
WBC: 9.9 10*3/uL (ref 4.5–10.5)

## 2014-02-19 NOTE — Progress Notes (Signed)
    HPI:  80 year-old gentleman presenting for follow-up evaluation. He is followed for permanent atrial fibrillation, HTN, obesity, and diastolic heart failure. He is generally  In very poor health with gait instability, urinary incontinence, and chronic dyspnea. He's been extremely sedentary over the years. He's also been noncompliant with medication. He tells me today that he has not taken Xarelto in the last 6 months. He is actually not taking any of his medicines right now. He does not provide any explanation for why he is off of his medicines.  From a symptomatic perspective, he denies bleeding problems, chest pain, lightheadedness, or syncope. He continues to have gait unsteadiness but no recent falls. Chronic dyspnea is unchanged over time. He has chronic reductive cough.  Outpatient Encounter Prescriptions as of 02/19/2014  Medication Sig  . alfuzosin (UROXATRAL) 10 MG 24 hr tablet Take 10 mg by mouth daily.  . Cholecalciferol (VITAMIN D) 1000 UNITS capsule Take 1,000 Units by mouth at bedtime.   Marland Kitchen DIGOX 125 MCG tablet TAKE 1 TABLET BY MOUTH EVERY DAY  . finasteride (PROSCAR) 5 MG tablet Take 5 mg by mouth daily.  . metoprolol (TOPROL-XL) 50 MG 24 hr tablet Take 50 mg by mouth at bedtime.   . Multiple Vitamin (MULITIVITAMIN WITH MINERALS) TABS Take 1 tablet by mouth at bedtime.  . niacin (NIASPAN) 500 MG CR tablet Take 500 mg by mouth at bedtime.    . pantoprazole (PROTONIX) 40 MG tablet Take 40 mg by mouth every evening.   . Rivaroxaban (XARELTO) 15 MG TABS tablet Take 1 tablet (15 mg total) by mouth daily.  . vitamin C (ASCORBIC ACID) 500 MG tablet Take 500 mg by mouth at bedtime.     Allergies  Allergen Reactions  . Sulfa Antibiotics Swelling    hands    Past Medical History  Diagnosis Date  . Hypertension   . Hyperlipidemia   . Chronic atrial fibrillation   . Urinary retention foley last changed 3 weeks ago  . Arthritis   . Sleep apnea     no cpap used, sleep study was  several yrs ago    ROS: Negative except as per HPI  BP 124/80  Pulse 91  Ht 5\' 7"  (1.702 m)  Wt 225 lb (102.059 kg)  BMI 35.23 kg/m2  PHYSICAL EXAM: Pt is alert and oriented, elderly chronically ill-appearing gentleman in NAD HEENT: normal Neck: JVP - normal, carotids 2+= without bruits Lungs: Decreased breath sounds with scattered rhonchi throughout CV: Irregularly irregular without murmur or gallop Abd: soft, NT, Positive BS, no hepatomegaly Ext: no C/C/E, distal pulses intact and equal Skin: warm/dry no rash  EKG:  A 2 fibrillation 91 beats per minute, low-voltage QRS, incomplete right bundle branch block.  ASSESSMENT AND PLAN: This is an 80 year old gentleman with permanent atrial fibrillation. Lengthy discussion about need for chronic anticoagulation considering his high-risk of stroke. He is willing to reconsider taking an anticoagulant drug. He will be started back on Xarelto 15 mg daily. Also will reinitiate metoprolol succinate 50 mg daily. Will not refill his digoxin at this time. Will repeat a CBC a metabolic panel to make her Xarelto dosing as appropriate and rule out anemia before restarting this. I will see him back in 6 months for followup. He was reeducated about the importance of routine medical therapy and understands the danger of Xarelto discontinuation.  Sherren Mocha 02/19/2014 12:03 PM

## 2014-02-19 NOTE — Patient Instructions (Addendum)
Your physician has recommended you make the following change in your medication:  DO NOT Take Digoxin MAKE SURE you take your Metoprolol 50 mg and Xarelto 15 mg  Your physician wants you to follow-up in: 6 months with Dr. Burt Knack.  You will receive a reminder letter in the mail two months in advance. If you don't receive a letter, please call our office to schedule the follow-up appointment.  Your physician recommends that you have lab work today:  CBC, Basic Metabolic Panel

## 2014-03-16 ENCOUNTER — Telehealth: Payer: Self-pay | Admitting: Cardiovascular Disease

## 2014-03-16 NOTE — Telephone Encounter (Signed)
lmtcb Debbie Brycen Bean RN  

## 2014-03-16 NOTE — Telephone Encounter (Signed)
New message   Wife calling the last office visit which medication did Dr. Burt Knack take patient off on.

## 2014-03-16 NOTE — Telephone Encounter (Signed)
Wife calls to confirm that pt was to discontinue digoxin as indicated at last ov on 02/19/14. Confirmed digoxin was discontinued. Horton Chin RN

## 2014-08-31 ENCOUNTER — Ambulatory Visit (INDEPENDENT_AMBULATORY_CARE_PROVIDER_SITE_OTHER): Payer: Medicare Other | Admitting: Cardiovascular Disease

## 2014-08-31 ENCOUNTER — Encounter: Payer: Self-pay | Admitting: Cardiovascular Disease

## 2014-08-31 VITALS — BP 124/82 | HR 88 | Ht 67.0 in | Wt 219.8 lb

## 2014-08-31 DIAGNOSIS — I4891 Unspecified atrial fibrillation: Secondary | ICD-10-CM

## 2014-08-31 DIAGNOSIS — I482 Chronic atrial fibrillation, unspecified: Secondary | ICD-10-CM

## 2014-08-31 NOTE — Progress Notes (Signed)
    HPI:  80 year-old gentleman presenting for follow-up evaluation. He is followed for permanent atrial fibrillation, HTN, obesity, and diastolic heart failure. He is generally In very poor health with gait instability, urinary incontinence, and chronic dyspnea.   The patient reports no change in symptoms. He spends most of the day in bed or in a chair. He is primarily limited by generalized fatigue and low back pain/weakness. He has shortness of breath with exertion which is unchanged over time. He denies chest pain or pressure, lightheadedness, or syncope. He reports no bleeding problems on long-term anticoagulation with Xarelto.   Outpatient Encounter Prescriptions as of 08/31/2014  Medication Sig  . Cholecalciferol (VITAMIN D) 1000 UNITS capsule Take 1,000 Units by mouth at bedtime.   . finasteride (PROSCAR) 5 MG tablet Take 5 mg by mouth daily.  . metoprolol (TOPROL-XL) 50 MG 24 hr tablet Take 50 mg by mouth at bedtime.   . Multiple Vitamin (MULITIVITAMIN WITH MINERALS) TABS Take 1 tablet by mouth at bedtime.  . niacin (NIASPAN) 500 MG CR tablet Take 500 mg by mouth at bedtime.    . pantoprazole (PROTONIX) 40 MG tablet Take 40 mg by mouth every evening.   . Rivaroxaban (XARELTO) 15 MG TABS tablet Take 1 tablet (15 mg total) by mouth daily.  . vitamin C (ASCORBIC ACID) 500 MG tablet Take 500 mg by mouth at bedtime.   . [DISCONTINUED] alfuzosin (UROXATRAL) 10 MG 24 hr tablet Take 10 mg by mouth daily.    Allergies  Allergen Reactions  . Sulfa Antibiotics Swelling    hands    Past Medical History  Diagnosis Date  . Hypertension   . Hyperlipidemia   . Chronic atrial fibrillation   . Urinary retention foley last changed 3 weeks ago  . Arthritis   . Sleep apnea     no cpap used, sleep study was several yrs ago    ROS: Negative except as per HPI  BP 124/82  Pulse 88  Ht 5\' 7"  (1.702 m)  Wt 219 lb 12.8 oz (99.701 kg)  BMI 34.42 kg/m2  PHYSICAL EXAM: Pt is alert and  oriented, elderly, obese gentleman in NAD HEENT: normal Neck: JVP - difficult to visualize, mildly elevated, carotids 2+= without bruits Lungs: Inspiratory rales bilaterally CV: Irregularly irregular without murmur or gallop Abd: soft, NT, obese Ext: Trace pretibial edema bilaterally Skin: warm/dry no rash  EKG:  Atrial fibrillation 88 beats per minute, otherwise within normal limits.  ASSESSMENT AND PLAN:  Permanent atrial fibrillation. Currently stable without symptoms. Continue current medical program without changes. He will remain on Xarelto, which is renally dosed for his reduced creatinine clearance.  I'll see him back in 6 months for followup.  Sherren Mocha MD 08/31/2014 3:14 PM

## 2014-08-31 NOTE — Patient Instructions (Signed)
Your physician recommends that you continue on your current medications as directed. Please refer to the Current Medication list given to you today.  Your physician wants you to follow-up in: 6 months with Dr. Cooper.  You will receive a reminder letter in the mail two months in advance. If you don't receive a letter, please call our office to schedule the follow-up appointment.  

## 2014-11-19 ENCOUNTER — Other Ambulatory Visit: Payer: Self-pay | Admitting: Dermatology

## 2015-02-27 ENCOUNTER — Encounter: Payer: Self-pay | Admitting: Cardiovascular Disease

## 2015-02-27 ENCOUNTER — Ambulatory Visit (INDEPENDENT_AMBULATORY_CARE_PROVIDER_SITE_OTHER): Payer: Medicare Other | Admitting: Cardiovascular Disease

## 2015-02-27 VITALS — BP 115/82 | HR 97 | Ht 67.0 in | Wt 223.1 lb

## 2015-02-27 DIAGNOSIS — I482 Chronic atrial fibrillation, unspecified: Secondary | ICD-10-CM

## 2015-02-27 DIAGNOSIS — I10 Essential (primary) hypertension: Secondary | ICD-10-CM

## 2015-02-27 NOTE — Patient Instructions (Signed)
Your physician wants you to follow-up in: 6 MONTHS with Dr Cooper.  You will receive a reminder letter in the mail two months in advance. If you don't receive a letter, please call our office to schedule the follow-up appointment.  Your physician recommends that you continue on your current medications as directed. Please refer to the Current Medication list given to you today.  

## 2015-03-01 NOTE — Progress Notes (Signed)
Cardiology Office Note   Date:  03/01/2015   ID:  IFEANYICHUKWU WICKHAM, DOB [birth year], MRN [id number]  PCP:  Haywood Pao, MD  Cardiologist:  Sherren Mocha, MD    Chief Complaint  Patient presents with  . Dizziness    cough     History of Present Illness: [name] [name] is a 81 y.o. male who presents for permanent atrial fibrillation, HTN, obesity, and diastolic heart failure. He is generally In very poor health with gait instability, urinary incontinence, and chronic dyspnea.   The patient is stable with chronic shortness of breath. He continues to spend most of the day in bed or a chair. He is limited by gait instability and generalized fatigue. He has no orthopnea or PND. The patient has a chronic cough. He denies chest pain or pressure with walking.   Past Medical History  Diagnosis Date  . Hypertension   . Hyperlipidemia   . Chronic atrial fibrillation   . Urinary retention foley last changed 3 weeks ago  . Arthritis   . Sleep apnea     no cpap used, sleep study was several yrs ago    Past Surgical History  Procedure Laterality Date  . Back surgery  yrs ago    lower back surgery  . Tonsillectomy  as child  . Prostate surgery  yrs ago, no cancer  . Transurethral resection of prostate  02/22/2012    Procedure: TRANSURETHRAL RESECTION OF THE PROSTATE WITH GYRUS INSTRUMENTS;  Surgeon: Molli Hazard, MD;  Location: [facility];  Service: Urology;  Laterality: N/A;        Current Outpatient Prescriptions  Medication Sig Dispense Refill  . Cholecalciferol (VITAMIN D) 1000 UNITS capsule Take 1,000 Units by mouth at bedtime.     . finasteride (PROSCAR) 5 MG tablet Take 5 mg by mouth daily.    . metoprolol (TOPROL-XL) 50 MG 24 hr tablet Take 50 mg by mouth at bedtime.     . Multiple Vitamin (MULITIVITAMIN WITH MINERALS) TABS Take 1 tablet by mouth at bedtime.    . Rivaroxaban (XARELTO) 15 MG TABS tablet Take 1 tablet (15 mg total) by mouth daily. 1 tablet 0  .  vitamin C (ASCORBIC ACID) 500 MG tablet Take 500 mg by mouth at bedtime.      No current facility-administered medications for this visit.    Allergies:   Sulfa antibiotics   Social History:  The patient  reports that he has quit smoking. His smoking use included Cigarettes. He has a 25 pack-year smoking history. He has never used smokeless tobacco. He reports that he drinks alcohol. He reports that he does not use illicit drugs.   Family History:  The patient's  family history is not on file.    ROS:  Please see the history of present illness.  Otherwise, review of systems is positive for cough, shortness of breath, and dizziness..  All other systems are reviewed and negative.    PHYSICAL EXAM: VS:  BP 115/82 mmHg  Pulse 97  Ht 5\' 7"  (1.702 m)  Wt 223 lb 1.9 oz (101.207 kg)  BMI 34.94 kg/m2 , BMI Body mass index is 34.94 kg/(m^2). GEN: Elderly male in no acute distress HEENT: normal Neck: no JVD, no masses. No carotid bruits Cardiac: Irregular without murmur gallop             Respiratory:  Diffuse rhonchi GI: soft, nontender, nondistended, + BS MS: no deformity or atrophy Ext: 1+ pretibial edema bilaterally  Skin: warm and dry, no rash Neuro:  Strength and sensation are intact Psych: euthymic mood, full affect  EKG:  EKG is ordered today. The ekg ordered today shows atrial fibrillation 97 bpm, low-voltage QRS, nonspecific ST abnormality.  Recent Labs: No results found for requested labs within last 365 days.   Lipid Panel  No results found for: CHOL, TRIG, HDL, CHOLHDL, VLDL, LDLCALC, LDLDIRECT    Wt Readings from Last 3 Encounters:  02/27/15 223 lb 1.9 oz (101.207 kg)  08/31/14 219 lb 12.8 oz (99.701 kg)  02/19/14 225 lb (102.059 kg)     Cardiac Studies Reviewed: 2D Echo 12/29/2012: Study Conclusions  - Left ventricle: The cavity size was normal. Wall thickness was increased in a pattern of mild LVH. There was mild focal basal hypertrophy of the septum.  Systolic function was normal. The estimated ejection fraction was in the range of 55% to 60%. Wall motion was normal; there were no regional wall motion abnormalities. The study is not technically sufficient to allow evaluation of LV diastolic function. - Aortic valve: Mild regurgitation. - Left atrium: The atrium was mildly dilated.  ASSESSMENT AND PLAN: Permanent atrial fibrillation: The patient is tolerating anticoagulation with Xarelto, dosed renally because of reduced GFR. He has slowly progressive generalized decline but no signs of worsening congestive heart failure or angina. Continue current medicines without changes. I advised him to do his best at increasing his activity level.  Current medicines are reviewed with the patient today.  The patient does not have concerns regarding medicines.  The following changes have been made:  no change  Labs/ tests ordered today include:  Orders Placed This Encounter  Procedures  . EKG 12-Lead    Disposition:   FU 6 months  Signed, Sherren Mocha, MD  03/01/2015 4:52 PM    [facility] [address], [address], [place]  [address] Phone: [phone]; Fax: [phone]

## 2015-04-21 ENCOUNTER — Emergency Department (HOSPITAL_COMMUNITY): Payer: Medicare Other

## 2015-04-21 ENCOUNTER — Observation Stay (HOSPITAL_COMMUNITY)
Admission: EM | Admit: 2015-04-21 | Discharge: 2015-04-22 | Disposition: A | Discharge status: Home / Self Care | Payer: Medicare Other | Attending: Internal Medicine | Admitting: Internal Medicine

## 2015-04-21 ENCOUNTER — Encounter (HOSPITAL_COMMUNITY): Payer: Self-pay | Admitting: *Deleted

## 2015-04-21 DIAGNOSIS — Z87891 Personal history of nicotine dependence: Secondary | ICD-10-CM | POA: Diagnosis not present

## 2015-04-21 DIAGNOSIS — R739 Hyperglycemia, unspecified: Secondary | ICD-10-CM | POA: Diagnosis present

## 2015-04-21 DIAGNOSIS — Z9114 Patient's other noncompliance with medication regimen: Secondary | ICD-10-CM

## 2015-04-21 DIAGNOSIS — G4730 Sleep apnea, unspecified: Secondary | ICD-10-CM | POA: Diagnosis not present

## 2015-04-21 DIAGNOSIS — R05 Cough: Secondary | ICD-10-CM | POA: Diagnosis present

## 2015-04-21 DIAGNOSIS — N179 Acute kidney failure, unspecified: Secondary | ICD-10-CM

## 2015-04-21 DIAGNOSIS — E785 Hyperlipidemia, unspecified: Secondary | ICD-10-CM | POA: Diagnosis not present

## 2015-04-21 DIAGNOSIS — N183 Chronic kidney disease, stage 3 unspecified: Secondary | ICD-10-CM

## 2015-04-21 DIAGNOSIS — I482 Chronic atrial fibrillation, unspecified: Secondary | ICD-10-CM

## 2015-04-21 DIAGNOSIS — Z79899 Other long term (current) drug therapy: Secondary | ICD-10-CM | POA: Diagnosis not present

## 2015-04-21 DIAGNOSIS — J40 Bronchitis, not specified as acute or chronic: Secondary | ICD-10-CM | POA: Diagnosis not present

## 2015-04-21 DIAGNOSIS — R000 Tachycardia, unspecified: Secondary | ICD-10-CM | POA: Diagnosis present

## 2015-04-21 DIAGNOSIS — I10 Essential (primary) hypertension: Secondary | ICD-10-CM | POA: Diagnosis not present

## 2015-04-21 DIAGNOSIS — J209 Acute bronchitis, unspecified: Secondary | ICD-10-CM | POA: Diagnosis not present

## 2015-04-21 DIAGNOSIS — M1990 Unspecified osteoarthritis, unspecified site: Secondary | ICD-10-CM | POA: Insufficient documentation

## 2015-04-21 DIAGNOSIS — R609 Edema, unspecified: Secondary | ICD-10-CM | POA: Diagnosis not present

## 2015-04-21 DIAGNOSIS — N400 Enlarged prostate without lower urinary tract symptoms: Secondary | ICD-10-CM | POA: Diagnosis present

## 2015-04-21 DIAGNOSIS — I480 Paroxysmal atrial fibrillation: Secondary | ICD-10-CM | POA: Diagnosis not present

## 2015-04-21 DIAGNOSIS — Z9119 Patient's noncompliance with other medical treatment and regimen: Secondary | ICD-10-CM | POA: Insufficient documentation

## 2015-04-21 HISTORY — DX: Hyperglycemia, unspecified: R73.9

## 2015-04-21 HISTORY — DX: Tachycardia, unspecified: R00.0

## 2015-04-21 HISTORY — DX: Bronchitis, not specified as acute or chronic: J40

## 2015-04-21 HISTORY — DX: Chronic kidney disease, stage 3 unspecified: N18.30

## 2015-04-21 HISTORY — DX: Acute kidney failure, unspecified: N17.9

## 2015-04-21 HISTORY — DX: Benign prostatic hyperplasia without lower urinary tract symptoms: N40.0

## 2015-04-21 LAB — I-STAT TROPONIN, ED: Troponin i, poc: 0 ng/mL (ref 0.00–0.08)

## 2015-04-21 LAB — BASIC METABOLIC PANEL
Anion gap: 13 (ref 5–15)
BUN: 16 mg/dL (ref 6–20)
CALCIUM: 9.1 mg/dL (ref 8.9–10.3)
CO2: 22 mmol/L (ref 22–32)
CREATININE: 1.66 mg/dL — AB (ref 0.61–1.24)
Chloride: 99 mmol/L — ABNORMAL LOW (ref 101–111)
GFR, EST AFRICAN AMERICAN: 41 mL/min — AB (ref >60)
GFR, EST NON AFRICAN AMERICAN: 35 mL/min — AB (ref >60)
GLUCOSE: 175 mg/dL — AB (ref 70–99)
Potassium: 3.8 mmol/L (ref 3.5–5.1)
Sodium: 134 mmol/L — ABNORMAL LOW (ref 135–145)

## 2015-04-21 LAB — CBC WITH DIFFERENTIAL/PLATELET
BASOS PCT: 0 % (ref 0–1)
Basophils Absolute: 0 10*3/uL (ref 0.0–0.1)
EOS ABS: 0.3 10*3/uL (ref 0.0–0.7)
Eosinophils Relative: 3 % (ref 0–5)
HCT: 42.2 % (ref 39.0–52.0)
HEMOGLOBIN: 14.7 g/dL (ref 13.0–17.0)
Lymphocytes Relative: 11 % — ABNORMAL LOW (ref 12–46)
Lymphs Abs: 1.2 10*3/uL (ref 0.7–4.0)
MCH: 32.8 pg (ref 26.0–34.0)
MCHC: 34.8 g/dL (ref 30.0–36.0)
MCV: 94.2 fL (ref 78.0–100.0)
MONO ABS: 0.9 10*3/uL (ref 0.1–1.0)
MONOS PCT: 8 % (ref 3–12)
NEUTROS PCT: 78 % — AB (ref 43–77)
Neutro Abs: 9.2 10*3/uL — ABNORMAL HIGH (ref 1.7–7.7)
Platelets: 180 10*3/uL (ref 150–400)
RBC: 4.48 MIL/uL (ref 4.22–5.81)
RDW: 13.5 % (ref 11.5–15.5)
WBC: 11.7 10*3/uL — ABNORMAL HIGH (ref 4.0–10.5)

## 2015-04-21 LAB — BRAIN NATRIURETIC PEPTIDE: B Natriuretic Peptide: 83.1 pg/mL (ref 0.0–100.0)

## 2015-04-21 LAB — I-STAT CG4 LACTIC ACID, ED: LACTIC ACID, VENOUS: 1.35 mmol/L (ref 0.5–2.0)

## 2015-04-21 MED ORDER — IPRATROPIUM-ALBUTEROL 0.5-2.5 (3) MG/3ML IN SOLN
3.0000 mL | Freq: Once | RESPIRATORY_TRACT | Status: AC
  Administered 2015-04-21: 3 mL via RESPIRATORY_TRACT
  Filled 2015-04-21: qty 3

## 2015-04-21 MED ORDER — LEVALBUTEROL HCL 0.63 MG/3ML IN NEBU: 0.6300 mg | INHALATION_SOLUTION | Freq: Four times a day (QID) | RESPIRATORY_TRACT | Status: DC | PRN

## 2015-04-21 MED ORDER — RIVAROXABAN 15 MG PO TABS
15.0000 mg | ORAL_TABLET | Freq: Every day | ORAL | Status: DC
  Administered 2015-04-21 – 2015-04-22 (×2): 15 mg via ORAL
  Filled 2015-04-21 (×3): qty 1

## 2015-04-21 MED ORDER — METOPROLOL TARTRATE 1 MG/ML IV SOLN
5.0000 mg | Freq: Once | INTRAVENOUS | Status: AC
  Administered 2015-04-21: 5 mg via INTRAVENOUS
  Filled 2015-04-21: qty 5

## 2015-04-21 MED ORDER — LEVOFLOXACIN 500 MG PO TABS
500.0000 mg | ORAL_TABLET | Freq: Once | ORAL | Status: AC
  Administered 2015-04-21: 500 mg via ORAL
  Filled 2015-04-21: qty 1

## 2015-04-21 MED ORDER — LEVOFLOXACIN 500 MG PO TABS
500.0000 mg | ORAL_TABLET | ORAL | Status: DC
  Administered 2015-04-22: 500 mg via ORAL
  Filled 2015-04-21 (×2): qty 1

## 2015-04-21 MED ORDER — ONDANSETRON HCL 4 MG PO TABS: 4.0000 mg | ORAL_TABLET | Freq: Four times a day (QID) | ORAL | Status: DC | PRN

## 2015-04-21 MED ORDER — ONDANSETRON HCL 4 MG/2ML IJ SOLN: 4.0000 mg | Freq: Four times a day (QID) | INTRAMUSCULAR | Status: DC | PRN

## 2015-04-21 MED ORDER — FUROSEMIDE 20 MG PO TABS
20.0000 mg | ORAL_TABLET | Freq: Once | ORAL | Status: AC
Start: 2015-04-21 — End: 2015-04-21
  Administered 2015-04-21: 20 mg via ORAL
  Filled 2015-04-21: qty 1

## 2015-04-21 MED ORDER — GUAIFENESIN 100 MG/5ML PO SOLN
5.0000 mL | Freq: Once | ORAL | Status: AC
  Administered 2015-04-21: 100 mg via ORAL
  Filled 2015-04-21: qty 5

## 2015-04-21 MED ORDER — METOPROLOL SUCCINATE ER 50 MG PO TB24
50.0000 mg | ORAL_TABLET | Freq: Every day | ORAL | Status: DC
  Administered 2015-04-21 – 2015-04-22 (×2): 50 mg via ORAL
  Filled 2015-04-21: qty 1
  Filled 2015-04-21: qty 2

## 2015-04-21 MED ORDER — ACETAMINOPHEN 325 MG PO TABS: 650.0000 mg | ORAL_TABLET | Freq: Four times a day (QID) | ORAL | Status: DC | PRN

## 2015-04-21 MED ORDER — ACETAMINOPHEN 650 MG RE SUPP: 650.0000 mg | Freq: Four times a day (QID) | RECTAL | Status: DC | PRN

## 2015-04-21 MED ORDER — FINASTERIDE 5 MG PO TABS
5.0000 mg | ORAL_TABLET | Freq: Every day | ORAL | Status: DC
  Administered 2015-04-21 – 2015-04-22 (×2): 5 mg via ORAL
  Filled 2015-04-21 (×2): qty 1

## 2015-04-21 MED ORDER — BENZONATATE 100 MG PO CAPS
200.0000 mg | ORAL_CAPSULE | Freq: Two times a day (BID) | ORAL | Status: DC | PRN
  Administered 2015-04-21: 200 mg via ORAL
  Filled 2015-04-21: qty 2

## 2015-04-21 NOTE — Progress Notes (Signed)
Late entry. Pt admitted to 6e29 this morning for 23 hour observation. Transported by ED RN on stretcher. Telemetery box 26 placed on patient. Afib noted. Skin problems although noted discoloration of bilateral LE with swelling to LLE. NSL removed by patient. Catheter noted to be intact.  Anticipate patient to be discharged home with spouse in AM. Will continue to monitor. Bartholomew Crews, RN

## 2015-04-21 NOTE — Progress Notes (Signed)
Utilization review completed.  

## 2015-04-21 NOTE — H&P (Signed)
History and Physical  [name]:[id number] DOB: [birth year] DOA: 04/21/2015  Referring physician: Linton Flemings, ER physician PCP: Haywood Pao, MD   Chief Complaint: Shortness of breath  HPI: [name] [name] is a 81 y.o. male  Past history of atrial fibrillation, BPH and hypertension who at some point in the last 1-2 months quit taking all of his medications except Proscar. He started having some increased cough and shortness of breath and came into the emergency room in the early morning hours of 5/1 for further evaluation. Lab work only noted a minimal elevation of white count of 11.7, normal BNP and chronic kidney disease at baseline, but otherwise normal. Chest x-ray showed signs of a mild bronchitis. However patient was noted to be significantly tachycardic and unstable on his feet when trying to stand easily dyspneic. He was not hypoxic. When he admitted that he stopped taking his medications, was felt best to come in overnight for observation and resumption of medications. Hospitalist will call for further evaluation.   Review of Systems:  Patient seen in the emergency room Pt complains of some cough, nonproductive and shortness of breath. Patient also feels unsteady on his feet  Pt denies any headaches, vision changes, dysphagia, chest pain, palpitations, abdominal pain, hematuria, dysuria, constipation, diarrhea, focal extremity numbness weakness or pain Review of systems are otherwise negative  Past Medical History  Diagnosis Date  . Hypertension   . Hyperlipidemia   . Chronic atrial fibrillation   . Urinary retention foley last changed 3 weeks ago  . Arthritis   . Sleep apnea     no cpap used, sleep study was several yrs ago   Past Surgical History  Procedure Laterality Date  . Back surgery  yrs ago    lower back surgery  . Tonsillectomy  as child  . Prostate surgery  yrs ago, no cancer  . Transurethral resection of prostate  02/22/2012    Procedure:  TRANSURETHRAL RESECTION OF THE PROSTATE WITH GYRUS INSTRUMENTS;  Surgeon: Molli Hazard, MD;  Location: [facility];  Service: Urology;  Laterality: N/A;       Social History:  reports that he has quit smoking. His smoking use included Cigarettes. He has a 25 pack-year smoking history. He has never used smokeless tobacco. He reports that he drinks alcohol. He reports that he does not use illicit drugs. Patient lives at home with his wife & is able to participate in activities of daily living and is supposed to use a walker, but does not  Allergies  Allergen Reactions  . Sulfa Antibiotics Swelling    hands    Family history: Hypertension  Prior to Admission medications   Medication Sig Start Date End Date Taking? Authorizing Provider  finasteride (PROSCAR) 5 MG tablet Take 5 mg by mouth daily.   Yes Historical Provider, MD  naproxen sodium (ANAPROX) 220 MG tablet Take 220 mg by mouth as needed.   Yes Historical Provider, MD  Cholecalciferol (VITAMIN D) 1000 UNITS capsule Take 1,000 Units by mouth at bedtime.     Historical Provider, MD  metoprolol (TOPROL-XL) 50 MG 24 hr tablet Take 50 mg by mouth at bedtime.     Historical Provider, MD  Multiple Vitamin (MULITIVITAMIN WITH MINERALS) TABS Take 1 tablet by mouth at bedtime.    Historical Provider, MD  Rivaroxaban (XARELTO) 15 MG TABS tablet Take 1 tablet (15 mg total) by mouth daily. Patient not taking: Reported on 04/21/2015 02/27/12   [name] Bucco, MD  vitamin  C (ASCORBIC ACID) 500 MG tablet Take 500 mg by mouth at bedtime.     Historical Provider, MD    Physical Exam: BP 116/66 mmHg  Pulse 97  Temp(Src) 98.6 F (37 C) (Oral)  Resp 20  SpO2 94%  General:  Alert and oriented 3, no acute distress Eyes: Sclera nonicteric a max Dr. movements are intact ENT: Normocephalic, atraumatic him because remainder slightly dry Neck: Thick, no JVD Cardiovascular: Irregular rhythm, borderline tachycardia Respiratory: Mostly upper airway,  few rhonchi Abdomen: Soft, obese, nontender, positive bowel sounds Skin: Some mild venous stasis changes of the left lower extremity which patient acknowledges his chronic Musculoskeletal: Trace pitting edema bilaterally Psychiatric: Patient is appropriate, no evidence of psychoses Neurologic: No focal deficits           Labs on Admission:  Basic Metabolic Panel:  Recent Labs Lab 04/21/15 0424  NA 134*  K 3.8  CL 99*  CO2 22  GLUCOSE 175*  BUN 16  CREATININE 1.66*  CALCIUM 9.1   Liver Function Tests: No results for input(s): AST, ALT, ALKPHOS, BILITOT, PROT, ALBUMIN in the last 168 hours. No results for input(s): LIPASE, AMYLASE in the last 168 hours. No results for input(s): AMMONIA in the last 168 hours. CBC:  Recent Labs Lab 04/21/15 0424  WBC 11.7*  NEUTROABS 9.2*  HGB 14.7  HCT 42.2  MCV 94.2  PLT 180   Cardiac Enzymes: No results for input(s): CKTOTAL, CKMB, CKMBINDEX, TROPONINI in the last 168 hours.  BNP (last 3 results)  Recent Labs  04/21/15 0524  BNP 83.1    ProBNP (last 3 results) No results for input(s): PROBNP in the last 8760 hours.  CBG: No results for input(s): GLUCAP in the last 168 hours.  Radiological Exams on Admission: Dg Chest 2 View  04/21/2015   CLINICAL DATA:  Nonproductive cough for 2 days, back pain. History of hypertension, hyperlipidemia, atrial fibrillation.  EXAM: CHEST  2 VIEW  COMPARISON:  Chest radiograph December 28, 2012  FINDINGS: Cardiac silhouette is unremarkable and unchanged. Calcified aortic knob. Similar bronchitic changes without pleural effusion or focal consolidation. No pneumothorax. Soft tissue planes included osseous structure nonsuspicious.  IMPRESSION: Similar bronchitic changes without focal consolidation.   Electronically Signed   By: Elon Alas   On: 04/21/2015 05:50    EKG: Not done  Assessment/Plan Present on Admission:  . CKD (chronic kidney disease) stage 3, GFR 30-59 ml/min: In review of  previous labs, creatinine at baseline  . HYPERTENSION, BENIGN: Have resumed beta blocker. See below.  . Hyperlipidemia . Atrial fibrillation with rapid ventricular rate: Patient had quit taking his Xarelto and beta blocker. Given 1 dose of IV Lopressor and restarted on by mouth beta blocker. Heart rate already below 100. Confirmed with patient that he is willing to restart taking his medications again. Resume Xarelto as per pharmacy. Heart rate should be better controlled by tomorrow  . Hyperglycemia: Checking A1c  . Bronchitis: By mouth Levaquin, oxygen saturation stable on room air   . BPH (benign prostatic hypertrophy): Continued on Proscar  Consultants: None  Code Status: Patient confirms full code  Family Communication: Left message with wife   Disposition Plan: Discharge home tomorrow  Time spent: 40 minutes  [facility] Pager [phone]

## 2015-04-21 NOTE — ED Provider Notes (Signed)
CSN: [id number]     Arrival date & time 04/21/15  0356 History   First MD Initiated Contact with Patient 04/21/15 [phone]     Chief Complaint  Patient presents with  . Cough     (Consider location/radiation/quality/duration/timing/severity/associated sxs/prior Treatment) HPI 81 year old male presents to emergency department from home with complaint of cough and back pain.  Patient has history of hypertension, hyperlipidemia, sleep apnea, congestive heart failure, atrial fibrillation.  Patient readily admits that he has been not been taking any of his medication except his Proscar for several weeks.  He reports he doesn't seem the point in it.  He denies any fever or chills.  He is coughing up yellow phlegm.  He denies history of COPD.  Remote history of smoking.  Failure reports patient is usually short of breath, and can do little on his own.  Patient complaining of tightness across his abdomen.  Denies any increase in the swelling in his lower legs. Past Medical History  Diagnosis Date  . Hypertension   . Hyperlipidemia   . Chronic atrial fibrillation   . Urinary retention foley last changed 3 weeks ago  . Arthritis   . Sleep apnea     no cpap used, sleep study was several yrs ago   Past Surgical History  Procedure Laterality Date  . Back surgery  yrs ago    lower back surgery  . Tonsillectomy  as child  . Prostate surgery  yrs ago, no cancer  . Transurethral resection of prostate  02/22/2012    Procedure: TRANSURETHRAL RESECTION OF THE PROSTATE WITH GYRUS INSTRUMENTS;  Surgeon: Molli Hazard, MD;  Location: [facility];  Service: Urology;  Laterality: N/A;       No family history on file. History  Substance Use Topics  . Smoking status: Former Smoker -- 0.50 packs/day for 50 years    Types: Cigarettes  . Smokeless tobacco: Never Used  . Alcohol Use: Yes     Comment: rare use    Review of Systems   See History of Present Illness; otherwise all other systems are reviewed  and negative  Allergies  Sulfa antibiotics  Home Medications   Prior to Admission medications   Medication Sig Start Date End Date Taking? Authorizing Provider  Cholecalciferol (VITAMIN D) 1000 UNITS capsule Take 1,000 Units by mouth at bedtime.     Historical Provider, MD  finasteride (PROSCAR) 5 MG tablet Take 5 mg by mouth daily.    Historical Provider, MD  metoprolol (TOPROL-XL) 50 MG 24 hr tablet Take 50 mg by mouth at bedtime.     Historical Provider, MD  Multiple Vitamin (MULITIVITAMIN WITH MINERALS) TABS Take 1 tablet by mouth at bedtime.    Historical Provider, MD  Rivaroxaban (XARELTO) 15 MG TABS tablet Take 1 tablet (15 mg total) by mouth daily. 02/27/12   Rolan Bucco, MD  vitamin C (ASCORBIC ACID) 500 MG tablet Take 500 mg by mouth at bedtime.     Historical Provider, MD   BP 170/95 mmHg  Pulse 120  Temp(Src) 99.4 F (37.4 C) (Oral)  Resp 24  SpO2 94% Physical Exam  Constitutional: He is oriented to person, place, and time. He appears well-developed and well-nourished.  HENT:  Head: Normocephalic and atraumatic.  Nose: Nose normal.  Mouth/Throat: Oropharynx is clear and moist.  Eyes: Conjunctivae and EOM are normal. Pupils are equal, round, and reactive to light.  Neck: Normal range of motion. Neck supple. No JVD present. No tracheal deviation present. No  thyromegaly present.  Cardiovascular: Normal heart sounds and intact distal pulses.  Exam reveals no gallop and no friction rub.   No murmur heard. Tachycardia with irregular rhythm  Pulmonary/Chest: Effort normal. No stridor. No respiratory distress. He has wheezes. He has no rales. He exhibits no tenderness.  Coughing  Abdominal: Soft. Bowel sounds are normal. He exhibits no distension and no mass. There is no tenderness. There is no rebound and no guarding.  Musculoskeletal: Normal range of motion. He exhibits edema. He exhibits no tenderness.  Lymphadenopathy:    He has no cervical adenopathy.  Neurological:  He is alert and oriented to person, place, and time. He displays normal reflexes. He exhibits normal muscle tone. Coordination normal.  Skin: Skin is warm and dry. No rash noted. No erythema. No pallor.  Psychiatric: He has a normal mood and affect. His behavior is normal. Judgment and thought content normal.  Nursing note and vitals reviewed.   ED Course  Procedures (including critical care time) Labs Review Labs Reviewed  BASIC METABOLIC PANEL - Abnormal; Notable for the following:    Sodium 134 (*)    Chloride 99 (*)    Glucose, Bld 175 (*)    Creatinine, Ser 1.66 (*)    GFR calc non Af Amer 35 (*)    GFR calc Af Amer 41 (*)    All other components within normal limits  CBC WITH DIFFERENTIAL/PLATELET - Abnormal; Notable for the following:    WBC 11.7 (*)    Neutrophils Relative % 78 (*)    Neutro Abs 9.2 (*)    Lymphocytes Relative 11 (*)    All other components within normal limits  BRAIN NATRIURETIC PEPTIDE  I-STAT CG4 LACTIC ACID, ED  Randolm Idol, ED    Imaging Review Dg Chest 2 View  04/21/2015   CLINICAL DATA:  Nonproductive cough for 2 days, back pain. History of hypertension, hyperlipidemia, atrial fibrillation.  EXAM: CHEST  2 VIEW  COMPARISON:  Chest radiograph December 28, 2012  FINDINGS: Cardiac silhouette is unremarkable and unchanged. Calcified aortic knob. Similar bronchitic changes without pleural effusion or focal consolidation. No pneumothorax. Soft tissue planes included osseous structure nonsuspicious.  IMPRESSION: Similar bronchitic changes without focal consolidation.   Electronically Signed   By: Elon Alas   On: 04/21/2015 05:50     EKG Interpretation None     ED ECG REPORT   Date: 04/21/2015  Rate: 107  Rhythm: atrial fibrillation  QRS Axis: normal  Intervals: afib  ST/T Wave abnormalities: nonspecific ST/T changes  Conduction Disutrbances:none  Narrative Interpretation:   Old EKG Reviewed: changes noted rate increased  I have  personally reviewed the EKG tracing and agree with the computerized printout as noted.  MDM   Final diagnoses:  Bronchitis  Noncompliance with medication regimen  Chronic atrial fibrillation    81 year old male with cough.  Differential includes pneumonia, new onset COPD, CHF exacerbation, bronchitis.  Plan for lab work, EKG, chest x-ray and DuoNeb.   Patient much improved with DuoNeb.  Still has cough, but wheezing has resolved.  Heart rate is improving.  Chest x-ray without pneumonia, but does show rhonchi this.  No signs of fluid overload.  Will start patient back on beta blockers, treat cough.   Patient persistently tachycardic, cough no better.  Plan for admission to restart on his beta-blockade, treatment of his bronchitis.    Linton Flemings, MD 04/21/15 [phone]

## 2015-04-21 NOTE — ED Notes (Signed)
MD Otter at bedside.

## 2015-04-21 NOTE — Progress Notes (Signed)
ANTICOAGULATION CONSULT NOTE - Initial Consult  Pharmacy Consult for xarelto Indication: atrial fibrillation  Allergies  Allergen Reactions  . Sulfa Antibiotics Swelling    hands    Vital Signs: Temp: 98.6 F (37 C) (05/01 0714) Temp Source: Oral (05/01 0714) BP: 116/66 mmHg (05/01 0815) Pulse Rate: 97 (05/01 0815)  Labs:  Recent Labs  04/21/15 0424  HGB 14.7  HCT 42.2  PLT 180  CREATININE 1.66*    CrCl cannot be calculated (Unknown ideal weight.).   Medical History: Past Medical History  Diagnosis Date  . Hypertension   . Hyperlipidemia   . Chronic atrial fibrillation   . Urinary retention foley last changed 3 weeks ago  . Arthritis   . Sleep apnea     no cpap used, sleep study was several yrs ago    Medications:  Patient was not taking any medications except proscar.  Assessment: 81 yo man to resume xarelto for afib.  His CrCl ~40 ml/min.   Goal of Therapy:  Therapeutic anticoagulation Monitor platelets by anticoagulation protocol: Yes   Plan:  Restart xarelto 15mg  po daily Monitor for bleeding complication.  Thanks for allowing pharmacy to be a part of this patient's care.  Excell Seltzer, PharmD Clinical Pharmacist, [phone]  04/21/2015,8:49 AM

## 2015-04-21 NOTE — ED Notes (Signed)
The pt is c/o a cough for 3-4 days and when he coughs his back hurts him

## 2015-04-22 DIAGNOSIS — N183 Chronic kidney disease, stage 3 (moderate): Secondary | ICD-10-CM | POA: Diagnosis not present

## 2015-04-22 DIAGNOSIS — I480 Paroxysmal atrial fibrillation: Secondary | ICD-10-CM | POA: Diagnosis not present

## 2015-04-22 DIAGNOSIS — J40 Bronchitis, not specified as acute or chronic: Secondary | ICD-10-CM | POA: Diagnosis not present

## 2015-04-22 LAB — BASIC METABOLIC PANEL
Anion gap: 10 (ref 5–15)
BUN: 20 mg/dL (ref 6–20)
CHLORIDE: 103 mmol/L (ref 101–111)
CO2: 24 mmol/L (ref 22–32)
CREATININE: 1.6 mg/dL — AB (ref 0.61–1.24)
Calcium: 8.9 mg/dL (ref 8.9–10.3)
GFR calc non Af Amer: 37 mL/min — ABNORMAL LOW (ref >60)
GFR, EST AFRICAN AMERICAN: 43 mL/min — AB (ref >60)
Glucose, Bld: 144 mg/dL — ABNORMAL HIGH (ref 70–99)
POTASSIUM: 3.6 mmol/L (ref 3.5–5.1)
SODIUM: 137 mmol/L (ref 135–145)

## 2015-04-22 LAB — CBC
HEMATOCRIT: 41.2 % (ref 39.0–52.0)
HEMOGLOBIN: 14.1 g/dL (ref 13.0–17.0)
MCH: 32.5 pg (ref 26.0–34.0)
MCHC: 34.2 g/dL (ref 30.0–36.0)
MCV: 94.9 fL (ref 78.0–100.0)
PLATELETS: 162 10*3/uL (ref 150–400)
RBC: 4.34 MIL/uL (ref 4.22–5.81)
RDW: 13.6 % (ref 11.5–15.5)
WBC: 10.3 10*3/uL (ref 4.0–10.5)

## 2015-04-22 MED ORDER — LEVOFLOXACIN 500 MG PO TABS: 500.0000 mg | ORAL_TABLET | ORAL | Status: DC

## 2015-04-22 MED ORDER — LEVALBUTEROL HCL 0.63 MG/3ML IN NEBU: 0.6300 mg | INHALATION_SOLUTION | Freq: Four times a day (QID) | RESPIRATORY_TRACT | Status: DC | PRN

## 2015-04-22 MED ORDER — RIVAROXABAN 15 MG PO TABS: 15.0000 mg | ORAL_TABLET | Freq: Every day | ORAL | Status: DC

## 2015-04-22 NOTE — Care Management Note (Signed)
Case Management Note  Patient Details  Name: [name] [name] MRN: [id number] Date of Birth: Apr 24, 1926  Subjective/Objective:                  Bronchitis  Action/Plan: Lives at home with wife, [name] [name]  Expected Discharge Date:  04/22/15               Expected Discharge Plan:  [place]  In-House Referral:     Discharge planning Services  CM Consult  Post Acute Care Choice:  Home Health Choice offered to:  Spouse  DME Arranged:  [employer] worker wheelchair with seat cushion DME Agency:  [place]:  RN, PT [facility] Agency:  [place]  Status of Service:  Completed, signed off  Medicare Important Message Given:  No Date Medicare IM Given:    Medicare IM give by:    Date Additional Medicare IM Given:    Additional Medicare Important Message give by:     If discussed at [place] of Stay Meetings, dates discussed:    Additional Comments: NCM spoke to pt and offered choice for [facility]. Wife agreeable to [place] for [facility]. Contacted [facility] with new referral. Contacted [facility] for neb machine and light weight wheelchair for home. Pt has RW and cane at home. Jonnie Finner RN CCM Case Mgmt phone [phone]  Erenest Rasher, RN 04/22/2015, 1:56 PM

## 2015-04-22 NOTE — Evaluation (Signed)
Physical Therapy Evaluation Patient Details Name: [name] [name] MRN: [id number] DOB: 06/25/1926 Today's Date: 04/22/2015   History of Present Illness  Past history of atrial fibrillation, BPH and hypertension who at some point in the last 1-2 months quit taking all of his medications except Proscar. He started having some increased cough and shortness of breath and came into the emergency room in the early morning hours of 5/1 for further evaluation. Lab work only noted a minimal elevation of white count of 11.7, normal BNP and chronic kidney disease at baseline, but otherwise normal. Chest x-ray showed signs of a mild bronchitis. However patient was noted to be significantly tachycardic and unstable on his feet when trying to stand easily dyspneic. He was not hypoxic. When he admitted that he stopped taking his medications, was felt best to come in overnight for observation and resumption of medications.  Clinical Impression   Patient evaluated by Physical Therapy with no further acute PT needs identified, as pt is to dc home today. All education has been completed and the patient has no further questions.  See below for any follow-up Physical Therapy or equipment needs. PT is signing off. Thank you for this referral.  Given pt's festinating gait pattern, uncoordinated movement, and emotional lability, I encouraged pt and wife to continue regular checkups with primary care MD.     Follow Up Recommendations Home health PT;Supervision/Assistance - 24 hour (also HHOT,HHRN,HHAide)    Equipment Recommendations  Wheelchair (measurements PT);Wheelchair cushion (measurements PT)    Recommendations for Other Services       Precautions / Restrictions Precautions Precautions: Fall      Mobility  Bed Mobility Overal bed mobility: Needs Assistance Bed Mobility: Supine to Sit     Supine to sit: Mod assist     General bed mobility comments: Cues to initiate and for technique; Mod assist to  elevate trunk  Transfers Overall transfer level: Needs assistance Equipment used: Rolling walker (2 wheeled) Transfers: Sit to/from Stand Sit to Stand: Mod assist         General transfer comment: mod assist to help translate center of mass anteriorly over feet; Pt tends to initiate sit to stand with posterior lean, leading to sitting back down unpredictably  Ambulation/Gait Ambulation/Gait assistance: Mod assist Ambulation Distance (Feet): 12 Feet (6+6) Assistive device: Rolling walker (2 wheeled) Gait Pattern/deviations: Decreased step length - right;Decreased step length - left;Decreased stance time - right;Decreased stance time - left;Decreased stride length;Shuffle;Festinating Gait velocity: quite slow   General Gait Details: Mod assist for RW management and to facilitate full weight shift into stance (both R and L) to allow for freer LE advancement; gait pattern resembles festination; Rw too far ahead of pt  Stairs            Wheelchair Mobility    Modified Rankin (Stroke Patients Only)       Balance Overall balance assessment: History of Falls                                           Pertinent Vitals/Pain Pain Assessment: Faces Faces Pain Scale: Hurts even more Pain Location: chest when he coughs Pain Descriptors / Indicators: Grimacing Pain Intervention(s): Other (comment) (gave pillow for splinting)    Home Living Family/patient expects to be discharged to:: Private residence Living Arrangements: Spouse/significant other Available Help at Discharge: Family;Available PRN/intermittently Type of Home: House  Home Access: Stairs to enter   Entrance Stairs-Number of Steps: 1 Home Layout: One level Home Equipment: Walker - 2 wheels      Prior Function Level of Independence: Needs assistance   Gait / Transfers Assistance Needed: "wall walks" with wife nearby; just to and from bathroom typically  ADL's / Homemaking Assistance Needed:  assist        Hand Dominance        Extremity/Trunk Assessment   Upper Extremity Assessment: Generalized weakness           Lower Extremity Assessment: Generalized weakness      Cervical / Trunk Assessment: Other exceptions  Communication   Communication: No difficulties  Cognition Arousal/Alertness: Awake/alert Behavior During Therapy: WFL for tasks assessed/performed (very emotionally labile) Overall Cognitive Status: Within Functional Limits for tasks assessed (for simple mobility tasks)                      General Comments General comments (skin integrity, edema, etc.): Session conducted on Room Air; noted O2 sats decr to 86%, rebounded to 90% with rest adn cues for controlled breathing, no need for supplemental O2    Exercises        Assessment/Plan    PT Assessment All further PT needs can be met in the next venue of care  PT Diagnosis Difficulty walking;Abnormality of gait;Generalized weakness   PT Problem List Decreased strength;Decreased range of motion;Decreased activity tolerance;Decreased balance;Decreased mobility;Decreased coordination;Decreased knowledge of use of DME;Decreased safety awareness;Cardiopulmonary status limiting activity  PT Treatment Interventions     PT Goals (Current goals can be found in the Care Plan section) Acute Rehab PT Goals Patient Stated Goal: wants to get better PT Goal Formulation: All assessment and education complete, DC therapy    Frequency     Barriers to discharge        Co-evaluation               End of Session Equipment Utilized During Treatment: Gait belt Activity Tolerance: Patient limited by fatigue Patient left: in chair;with call bell/phone within reach;with family/visitor present Nurse Communication: Mobility status    Functional Assessment Tool Used: Clinical Judgement Functional Limitation: Mobility: Walking and moving around Mobility: Walking and Moving Around Current Status  (G8978): At least 20 percent but less than 40 percent impaired, limited or restricted Mobility: Walking and Moving Around Goal Status (G8979): At least 1 percent but less than 20 percent impaired, limited or restricted Mobility: Walking and Moving Around Discharge Status (G8980): At least 20 percent but less than 40 percent impaired, limited or restricted    Time: 1202-1258 PT Time Calculation (min) (ACUTE ONLY): 56 min   Charges:   PT Evaluation $Initial PT Evaluation Tier I: 1 Procedure PT Treatments $Gait Training: 23-37 mins $Therapeutic Activity: 8-22 mins   PT G Codes:   PT G-Codes **NOT FOR INPATIENT CLASS** Functional Assessment Tool Used: Clinical Judgement Functional Limitation: Mobility: Walking and moving around Mobility: Walking and Moving Around Current Status (G8978): At least 20 percent but less than 40 percent impaired, limited or restricted Mobility: Walking and Moving Around Goal Status (G8979): At least 1 percent but less than 20 percent impaired, limited or restricted Mobility: Walking and Moving Around Discharge Status (G8980): At least 20 percent but less than 40 percent impaired, limited or restricted    ,  [place] 04/22/2015, 2:42 PM   , PT  Acute Rehabilitation Services Pager [phone] Office [phone]  

## 2015-04-22 NOTE — Discharge Summary (Signed)
Physician Discharge Summary  [name] [name] OTL:[id number] DOB: 1926/10/15 DOA: 04/21/2015  PCP: Haywood Pao, MD  Admit date: 04/21/2015 Discharge date: 04/22/2015  Time spent: 35 minutes  Recommendations for Outpatient Follow-up:  Please repeat B-met to follow renal function/ Please follow result of HbA1c.   Discharge Diagnoses:    Bronchitis   Atrial fibrillation RVR   Hyperlipidemia   HYPERTENSION, BENIGN    CKD (chronic kidney disease) stage 3, GFR 30-59 ml/min   Hyperglycemia   Tachycardia   BPH (benign prostatic hypertrophy)   Discharge Condition: Stable.   Diet recommendation: Heart Healthy  Filed Weights   04/21/15 2242 04/22/15 1200  Weight: 98.022 kg (216 lb 1.6 oz) 98.022 kg (216 lb 1.6 oz)    History of present illness:  [name] [name] is a 81 y.o. male  Past history of atrial fibrillation, BPH and hypertension who at some point in the last 1-2 months quit taking all of his medications except Proscar. He started having some increased cough and shortness of breath and came into the emergency room in the early morning hours of 5/1 for further evaluation. Lab work only noted a minimal elevation of white count of 11.7, normal BNP and chronic kidney disease at baseline, but otherwise normal. Chest x-ray showed signs of a mild bronchitis. However patient was noted to be significantly tachycardic and unstable on his feet when trying to stand easily dyspneic. He was not hypoxic. When he admitted that he stopped taking his medications, was felt best to come in overnight for observation and resumption of medications. Hospitalist will call for further evaluation.  Hospital Course:  1-A fib with RVR; patient was not taking his medications. HR better controlled on metoprolol. Dyspnea has resolved. Will also resume xarelto.   2-Bronchitis; dyspnea improved. Discharge on Levaquin for 5 days and Xopenex nebulizer.   3-CKD; per records Cr goes from 1.4 to 1.6. Needs labs  next week.  4-Hyperglycemia; Hb-A1c pending. _0  Procedures:  none  Consultations:  none  Discharge Exam: Filed Vitals:   04/22/15 1000  BP: 138/84  Pulse: 93  Temp: 98.8 F (37.1 C)  Resp: 18    General: Alert in no distress.  Cardiovascular: S 1, S 2 RRR Respiratory: CTA  Discharge Instructions   Discharge Instructions    Diet - low sodium heart healthy    Complete by:  As directed      Increase activity slowly    Complete by:  As directed           Current Discharge Medication List    START taking these medications   Details  levalbuterol (XOPENEX) 0.63 MG/3ML nebulizer solution Take 3 mLs (0.63 mg total) by nebulization every 6 (six) hours as needed for wheezing or shortness of breath. Qty: 3 mL, Refills: 12    levofloxacin (LEVAQUIN) 500 MG tablet Take 1 tablet (500 mg total) by mouth daily. Qty: 5 tablet, Refills: 0      CONTINUE these medications which have NOT CHANGED   Details  finasteride (PROSCAR) 5 MG tablet Take 5 mg by mouth daily.    Cholecalciferol (VITAMIN D) 1000 UNITS capsule Take 1,000 Units by mouth at bedtime.     metoprolol (TOPROL-XL) 50 MG 24 hr tablet Take 50 mg by mouth at bedtime.     Multiple Vitamin (MULITIVITAMIN WITH MINERALS) TABS Take 1 tablet by mouth at bedtime.    Rivaroxaban (XARELTO) 15 MG TABS tablet Take 1 tablet (15 mg total) by mouth daily. Qty:  1 tablet, Refills: 0    vitamin C (ASCORBIC ACID) 500 MG tablet Take 500 mg by mouth at bedtime.       STOP taking these medications     naproxen sodium (ANAPROX) 220 MG tablet        Allergies  Allergen Reactions  . Sulfa Antibiotics Swelling    hands   Follow-up Information    Follow up with Haywood Pao, MD In 1 week.   Specialty:  Internal Medicine   Contact information:   [place] [address] [phone]        The results of significant diagnostics from this hospitalization (including imaging, microbiology, ancillary and  laboratory) are listed below for reference.    Significant Diagnostic Studies: Dg Chest 2 View  04/21/2015   CLINICAL DATA:  Nonproductive cough for 2 days, back pain. History of hypertension, hyperlipidemia, atrial fibrillation.  EXAM: CHEST  2 VIEW  COMPARISON:  Chest radiograph December 28, 2012  FINDINGS: Cardiac silhouette is unremarkable and unchanged. Calcified aortic knob. Similar bronchitic changes without pleural effusion or focal consolidation. No pneumothorax. Soft tissue planes included osseous structure nonsuspicious.  IMPRESSION: Similar bronchitic changes without focal consolidation.   Electronically Signed   By: Elon Alas   On: 04/21/2015 05:50    Microbiology: No results found for this or any previous visit (from the past 240 hour(s)).   Labs: Basic Metabolic Panel:  Recent Labs Lab 04/21/15 0424 04/22/15 0627  NA 134* 137  K 3.8 3.6  CL 99* 103  CO2 22 24  GLUCOSE 175* 144*  BUN 16 20  CREATININE 1.66* 1.60*  CALCIUM 9.1 8.9   Liver Function Tests: No results for input(s): AST, ALT, ALKPHOS, BILITOT, PROT, ALBUMIN in the last 168 hours. No results for input(s): LIPASE, AMYLASE in the last 168 hours. No results for input(s): AMMONIA in the last 168 hours. CBC:  Recent Labs Lab 04/21/15 0424 04/22/15 0627  WBC 11.7* 10.3  NEUTROABS 9.2*  --   HGB 14.7 14.1  HCT 42.2 41.2  MCV 94.2 94.9  PLT 180 162   Cardiac Enzymes: No results for input(s): CKTOTAL, CKMB, CKMBINDEX, TROPONINI in the last 168 hours. BNP: BNP (last 3 results)  Recent Labs  04/21/15 0524  BNP 83.1    ProBNP (last 3 results) No results for input(s): PROBNP in the last 8760 hours.  CBG: No results for input(s): GLUCAP in the last 168 hours.     SignedNiel Hummer A  [facility] 04/22/2015, 1:01 PM

## 2015-04-22 NOTE — Progress Notes (Signed)
Patient discharged per instructions. Patient and his wife refused wheelchair stating it wouldn't fit through the door of their home. Patient's wife at bedside for d/c teaching/instructions. All d/c instructions/appointments/medications/prescriptions discussed. Discussed importance of taking xarelto every day. Time allowed for questions/concerns. Patient and his wife state they have none. Patient taken to the main entrance via wheelchair by Stann Ore for transport home by patient's wife. Patient able to stand, pivot, and get into wheelchair and vehicle with minimal assistance. Roselyn Reef Fariha Goto,RN

## 2015-04-23 LAB — HEMOGLOBIN A1C
Hgb A1c MFr Bld: 7.5 % — ABNORMAL HIGH (ref 4.8–5.6)
Mean Plasma Glucose: 169 mg/dL

## 2015-04-24 ENCOUNTER — Other Ambulatory Visit: Payer: Self-pay | Admitting: *Deleted

## 2015-04-24 MED ORDER — RIVAROXABAN 15 MG PO TABS: 15.0000 mg | ORAL_TABLET | Freq: Every day | ORAL | Status: DC

## 2015-10-11 ENCOUNTER — Ambulatory Visit: Payer: Medicare Other | Admitting: Cardiovascular Disease

## 2015-10-16 ENCOUNTER — Ambulatory Visit (INDEPENDENT_AMBULATORY_CARE_PROVIDER_SITE_OTHER): Payer: Medicare Other | Admitting: Cardiovascular Disease

## 2015-10-16 ENCOUNTER — Encounter: Payer: Self-pay | Admitting: Cardiovascular Disease

## 2015-10-16 VITALS — BP 138/90 | HR 97 | Ht 67.0 in | Wt 222.0 lb

## 2015-10-16 DIAGNOSIS — E785 Hyperlipidemia, unspecified: Secondary | ICD-10-CM

## 2015-10-16 DIAGNOSIS — I482 Chronic atrial fibrillation, unspecified: Secondary | ICD-10-CM

## 2015-10-16 DIAGNOSIS — I10 Essential (primary) hypertension: Secondary | ICD-10-CM | POA: Diagnosis not present

## 2015-10-16 MED ORDER — ASPIRIN EC 325 MG PO TBEC: 325.0000 mg | DELAYED_RELEASE_TABLET | Freq: Every day | ORAL | Status: DC

## 2015-10-16 MED ORDER — METOPROLOL SUCCINATE ER 50 MG PO TB24: 50.0000 mg | ORAL_TABLET | Freq: Every day | ORAL | Status: DC

## 2015-10-16 NOTE — Progress Notes (Signed)
Cardiology Office Note Date:  10/18/2015   ID:  [name] [name], DOB 1926/11/30, MRN [id number]  PCP:  Haywood Pao, MD  Cardiologist:  Sherren Mocha, MD    Chief Complaint  Patient presents with  . Shortness of Breath   History of Present Illness: [name] [name] is a 81 y.o. male who presents for follow-up evaluation. The patient is followed for permanent atrial fibrillation, hypertension, obesity, and diastolic heart failure. He has been in very poor health for many years. He has problems with gait instability, urinary incontinence, and chronic dyspnea. He has been very noncompliant with medical therapy over the years. He oftentimes stops taking all of his medications. He is no longer taking Xarelto on a regular basis. The patient presents today with his wife. He continues to complain of shortness of breath and leg swelling. He denies orthopnea or PND. He has no chest pain. He reports no other changes in his health since his last visit here 6 months ago.   Past Medical History  Diagnosis Date  . Hypertension   . Hyperlipidemia   . Chronic atrial fibrillation ([place])   . Urinary retention foley last changed 3 weeks ago  . Arthritis   . Sleep apnea     no cpap used, sleep study was several yrs ago    Past Surgical History  Procedure Laterality Date  . Back surgery  yrs ago    lower back surgery  . Tonsillectomy  as child  . Prostate surgery  yrs ago, no cancer  . Transurethral resection of prostate  02/22/2012    Procedure: TRANSURETHRAL RESECTION OF THE PROSTATE WITH GYRUS INSTRUMENTS;  Surgeon: Molli Hazard, MD;  Location: [facility];  Service: Urology;  Laterality: N/A;        Current Outpatient Prescriptions  Medication Sig Dispense Refill  . finasteride (PROSCAR) 5 MG tablet Take 5 mg by mouth daily.    Marland Kitchen levalbuterol (XOPENEX) 0.63 MG/3ML nebulizer solution Take 3 mLs (0.63 mg total) by nebulization every 6 (six) hours as needed for wheezing or shortness  of breath. 3 mL 12  . metoprolol succinate (TOPROL-XL) 50 MG 24 hr tablet Take 1 tablet (50 mg total) by mouth at bedtime. 30 tablet 8  . aspirin EC 325 MG tablet Take 1 tablet (325 mg total) by mouth daily. 30 tablet 0   No current facility-administered medications for this visit.    Allergies:   Sulfa antibiotics   Social History:  The patient  reports that he has quit smoking. His smoking use included Cigarettes. He has a 25 pack-year smoking history. He has never used smokeless tobacco. He reports that he drinks alcohol. He reports that he does not use illicit drugs.   Family History:  The patient's  family history includes Heart attack in his father.    ROS:  Please see the history of present illness.  Otherwise, review of systems is positive for  Cough, fatigue, back pain, leg swelling , balance problems, urinary incontinence.  All other systems are reviewed and negative.    PHYSICAL EXAM: VS:  BP 138/90 mmHg  Pulse 97  Ht 5\' 7"  (1.702 m)  Wt 222 lb (100.699 kg)  BMI 34.76 kg/m2 , BMI Body mass index is 34.76 kg/(m^2). GEN:  Elderly, chronically ill-appearing male, in no acute distress HEENT: normal Neck: no JVD, no masses. No carotid bruits Cardiac:  Irregularly irregular without murmur or gallop           Respiratory:  Inspiratory rales and expiratory rhonchi with prominent upper airway sounds , unchanged from baseline exam GI: soft, nontender, nondistended, + BS MS: no deformity or atrophy Ext:  Chronic stasis changes bilaterally, 1+ edema on the left, trace on the right Skin: warm and dry, no rash Neuro:  Strength and sensation are intact Psych: euthymic mood, full affect  EKG:  EKG is ordered today. The ekg ordered today shows  Atrial fibrillation 97 bpm , nonspecific ST abnormality  Recent Labs: 04/21/2015: B Natriuretic Peptide 83.1 04/22/2015: BUN 20; Creatinine, Ser 1.60*; Hemoglobin 14.1; Platelets 162; Potassium 3.6; Sodium 137   Lipid Panel  No results found  for: CHOL, TRIG, HDL, CHOLHDL, VLDL, LDLCALC, LDLDIRECT    Wt Readings from Last 3 Encounters:  10/16/15 222 lb (100.699 kg)  04/22/15 216 lb 1.6 oz (98.022 kg)  02/27/15 223 lb 1.9 oz (101.207 kg)     Cardiac Studies Reviewed:  2-D echocardiogram 05/29/2013: Study Conclusions  - Left ventricle: The cavity size was normal. Wall thickness was increased in a pattern of mild LVH. There was mild focal basal hypertrophy of the septum. Systolic function was normal. The estimated ejection fraction was in the range of 55% to 60%. Wall motion was normal; there were no regional wall motion abnormalities. The study is not technically sufficient to allow evaluation of LV diastolic function. - Aortic valve: Mild regurgitation. - Left atrium: The atrium was mildly dilated.  ASSESSMENT AND PLAN: Chronic atrial fibrillation: heart rate is elevated at rest. The patient is not taking his cardiac medications. I prescribed metoprolol succinate as he has taken in the past , and stressed the importance of medication adherence. We discussed chronic anticoagulation at length again. The patient has repeatedly returned for follow-up with issues related to medication noncompliance. He just stops taking his medications. This has happened multiple times. He is on Xarelto 15 mg daily. I think this is no longer safe for him because of noncompliance. Starting and stopping this medication might even increase his stroke risk. He is prescribed aspirin 325 mg daily today.  Current medicines are reviewed with the patient today.  The patient does not have concerns regarding medicines.  Labs/ tests ordered today include:   Orders Placed This Encounter  Procedures  . EKG 12-Lead    Disposition:   FU  6 months  Signed, Sherren Mocha, MD  10/18/2015 9:02 PM    [facility] [place], [place], [place]  [address] Phone: [phone]; Fax: [phone]

## 2015-10-16 NOTE — Patient Instructions (Addendum)
Medication Instructions:  Your physician has recommended you make the following change in your medication:  1. STOP Xarelto 2. START Aspirin 325mg  take one tablet by mouth daily (enteric coated) 2. RESTART Metoprolol Succinate 50mg  take one by mouth daily  Labwork: No new orders.   Testing/Procedures: No new orders.   Follow-Up: Your physician wants you to follow-up in: 6 MONTHS with Dr Burt Knack.  You will receive a reminder letter in the mail two months in advance. If you don't receive a letter, please call our office to schedule the follow-up appointment.   Any Other Special Instructions Will Be Listed Below (If Applicable).     If you need a refill on your cardiac medications before your next appointment, please call your pharmacy.

## 2016-01-28 ENCOUNTER — Emergency Department (HOSPITAL_COMMUNITY): Payer: Medicare Other

## 2016-01-28 ENCOUNTER — Encounter (HOSPITAL_COMMUNITY): Payer: Self-pay | Admitting: Emergency Medicine

## 2016-01-28 ENCOUNTER — Inpatient Hospital Stay (HOSPITAL_COMMUNITY)
Admission: EM | Admit: 2016-01-28 | Discharge: 2016-02-02 | DRG: 871 | Disposition: A | Discharge status: Home Health | Payer: Medicare Other | Attending: Internal Medicine | Admitting: Internal Medicine

## 2016-01-28 ENCOUNTER — Inpatient Hospital Stay (HOSPITAL_COMMUNITY): Payer: Medicare Other

## 2016-01-28 DIAGNOSIS — I10 Essential (primary) hypertension: Secondary | ICD-10-CM | POA: Diagnosis not present

## 2016-01-28 DIAGNOSIS — R058 Other specified cough: Secondary | ICD-10-CM

## 2016-01-28 DIAGNOSIS — I4891 Unspecified atrial fibrillation: Secondary | ICD-10-CM | POA: Diagnosis not present

## 2016-01-28 DIAGNOSIS — N183 Chronic kidney disease, stage 3 (moderate): Secondary | ICD-10-CM | POA: Diagnosis present

## 2016-01-28 DIAGNOSIS — I129 Hypertensive chronic kidney disease with stage 1 through stage 4 chronic kidney disease, or unspecified chronic kidney disease: Secondary | ICD-10-CM | POA: Diagnosis not present

## 2016-01-28 DIAGNOSIS — R296 Repeated falls: Secondary | ICD-10-CM | POA: Diagnosis present

## 2016-01-28 DIAGNOSIS — J209 Acute bronchitis, unspecified: Secondary | ICD-10-CM | POA: Diagnosis present

## 2016-01-28 DIAGNOSIS — J9601 Acute respiratory failure with hypoxia: Secondary | ICD-10-CM | POA: Diagnosis not present

## 2016-01-28 DIAGNOSIS — R7989 Other specified abnormal findings of blood chemistry: Secondary | ICD-10-CM | POA: Diagnosis present

## 2016-01-28 DIAGNOSIS — R740 Nonspecific elevation of levels of transaminase and lactic acid dehydrogenase [LDH]: Secondary | ICD-10-CM | POA: Diagnosis not present

## 2016-01-28 DIAGNOSIS — I482 Chronic atrial fibrillation, unspecified: Secondary | ICD-10-CM | POA: Diagnosis present

## 2016-01-28 DIAGNOSIS — G4733 Obstructive sleep apnea (adult) (pediatric): Secondary | ICD-10-CM | POA: Diagnosis present

## 2016-01-28 DIAGNOSIS — E860 Dehydration: Secondary | ICD-10-CM | POA: Diagnosis not present

## 2016-01-28 DIAGNOSIS — A419 Sepsis, unspecified organism: Secondary | ICD-10-CM | POA: Diagnosis not present

## 2016-01-28 DIAGNOSIS — Z7982 Long term (current) use of aspirin: Secondary | ICD-10-CM

## 2016-01-28 DIAGNOSIS — E872 Acidosis: Secondary | ICD-10-CM | POA: Diagnosis not present

## 2016-01-28 DIAGNOSIS — N400 Enlarged prostate without lower urinary tract symptoms: Secondary | ICD-10-CM | POA: Diagnosis present

## 2016-01-28 DIAGNOSIS — R05 Cough: Secondary | ICD-10-CM | POA: Diagnosis not present

## 2016-01-28 DIAGNOSIS — J208 Acute bronchitis due to other specified organisms: Secondary | ICD-10-CM | POA: Diagnosis not present

## 2016-01-28 DIAGNOSIS — W19XXXA Unspecified fall, initial encounter: Secondary | ICD-10-CM | POA: Diagnosis present

## 2016-01-28 DIAGNOSIS — J189 Pneumonia, unspecified organism: Secondary | ICD-10-CM

## 2016-01-28 DIAGNOSIS — E785 Hyperlipidemia, unspecified: Secondary | ICD-10-CM | POA: Diagnosis present

## 2016-01-28 DIAGNOSIS — R0602 Shortness of breath: Secondary | ICD-10-CM

## 2016-01-28 DIAGNOSIS — J40 Bronchitis, not specified as acute or chronic: Secondary | ICD-10-CM

## 2016-01-28 DIAGNOSIS — R0902 Hypoxemia: Secondary | ICD-10-CM

## 2016-01-28 DIAGNOSIS — F0390 Unspecified dementia without behavioral disturbance: Secondary | ICD-10-CM | POA: Diagnosis present

## 2016-01-28 DIAGNOSIS — N179 Acute kidney failure, unspecified: Secondary | ICD-10-CM | POA: Diagnosis present

## 2016-01-28 DIAGNOSIS — R0682 Tachypnea, not elsewhere classified: Secondary | ICD-10-CM | POA: Diagnosis not present

## 2016-01-28 DIAGNOSIS — Z87891 Personal history of nicotine dependence: Secondary | ICD-10-CM

## 2016-01-28 DIAGNOSIS — E1122 Type 2 diabetes mellitus with diabetic chronic kidney disease: Secondary | ICD-10-CM | POA: Diagnosis not present

## 2016-01-28 DIAGNOSIS — E119 Type 2 diabetes mellitus without complications: Secondary | ICD-10-CM

## 2016-01-28 HISTORY — DX: Other specified abnormal findings of blood chemistry: R79.89

## 2016-01-28 HISTORY — DX: Sepsis, unspecified organism: A41.9

## 2016-01-28 HISTORY — DX: Type 2 diabetes mellitus without complications: E11.9

## 2016-01-28 LAB — URINALYSIS, ROUTINE W REFLEX MICROSCOPIC
BILIRUBIN URINE: NEGATIVE
GLUCOSE, UA: 100 mg/dL — AB
HGB URINE DIPSTICK: NEGATIVE
Ketones, ur: NEGATIVE mg/dL
Leukocytes, UA: NEGATIVE
Nitrite: NEGATIVE
PH: 5 (ref 5.0–8.0)
Protein, ur: NEGATIVE mg/dL
Specific Gravity, Urine: 1.018 (ref 1.005–1.030)

## 2016-01-28 LAB — I-STAT ARTERIAL BLOOD GAS, ED
Acid-base deficit: 8 mmol/L — ABNORMAL HIGH (ref 0.0–2.0)
BICARBONATE: 17.3 meq/L — AB (ref 20.0–24.0)
O2 Saturation: 93 %
PCO2 ART: 35.9 mmHg (ref 35.0–45.0)
PO2 ART: 74 mmHg — AB (ref 80.0–100.0)
TCO2: 18 mmol/L (ref 0–100)
pH, Arterial: 7.29 — ABNORMAL LOW (ref 7.350–7.450)

## 2016-01-28 LAB — CBC WITH DIFFERENTIAL/PLATELET
Basophils Absolute: 0 10*3/uL (ref 0.0–0.1)
Basophils Relative: 0 %
Eosinophils Absolute: 0.2 10*3/uL (ref 0.0–0.7)
Eosinophils Relative: 1 %
HCT: 42.9 % (ref 39.0–52.0)
HEMOGLOBIN: 14.3 g/dL (ref 13.0–17.0)
LYMPHS PCT: 8 %
Lymphs Abs: 1.3 10*3/uL (ref 0.7–4.0)
MCH: 32.1 pg (ref 26.0–34.0)
MCHC: 33.3 g/dL (ref 30.0–36.0)
MCV: 96.4 fL (ref 78.0–100.0)
Monocytes Absolute: 0.6 10*3/uL (ref 0.1–1.0)
Monocytes Relative: 4 %
NEUTROS PCT: 87 %
Neutro Abs: 13.7 10*3/uL — ABNORMAL HIGH (ref 1.7–7.7)
Platelets: 163 10*3/uL (ref 150–400)
RBC: 4.45 MIL/uL (ref 4.22–5.81)
RDW: 13.5 % (ref 11.5–15.5)
WBC: 15.8 10*3/uL — AB (ref 4.0–10.5)

## 2016-01-28 LAB — COMPREHENSIVE METABOLIC PANEL
ALK PHOS: 75 U/L (ref 38–126)
ALT: 26 U/L (ref 17–63)
ANION GAP: 15 (ref 5–15)
AST: 30 U/L (ref 15–41)
Albumin: 4 g/dL (ref 3.5–5.0)
BILIRUBIN TOTAL: 0.9 mg/dL (ref 0.3–1.2)
BUN: 21 mg/dL — AB (ref 6–20)
CALCIUM: 9.6 mg/dL (ref 8.9–10.3)
CO2: 25 mmol/L (ref 22–32)
Chloride: 100 mmol/L — ABNORMAL LOW (ref 101–111)
Creatinine, Ser: 1.95 mg/dL — ABNORMAL HIGH (ref 0.61–1.24)
GFR calc Af Amer: 34 mL/min — ABNORMAL LOW (ref >60)
GFR calc non Af Amer: 29 mL/min — ABNORMAL LOW (ref >60)
GLUCOSE: 174 mg/dL — AB (ref 65–99)
Potassium: 4.3 mmol/L (ref 3.5–5.1)
SODIUM: 140 mmol/L (ref 135–145)
TOTAL PROTEIN: 8 g/dL (ref 6.5–8.1)

## 2016-01-28 LAB — LACTIC ACID, PLASMA
Lactic Acid, Venous: 3.5 mmol/L (ref 0.5–2.0)
Lactic Acid, Venous: 4.5 mmol/L (ref 0.5–2.0)

## 2016-01-28 LAB — I-STAT CG4 LACTIC ACID, ED: Lactic Acid, Venous: 2.79 mmol/L (ref 0.5–2.0)

## 2016-01-28 LAB — PROTIME-INR
INR: 1.23 (ref 0.00–1.49)
Prothrombin Time: 15.6 seconds — ABNORMAL HIGH (ref 11.6–15.2)

## 2016-01-28 LAB — TROPONIN I

## 2016-01-28 LAB — APTT: aPTT: 27 seconds (ref 24–37)

## 2016-01-28 LAB — PROCALCITONIN: Procalcitonin: 0.12 ng/mL

## 2016-01-28 LAB — STREP PNEUMONIAE URINARY ANTIGEN: STREP PNEUMO URINARY ANTIGEN: NEGATIVE

## 2016-01-28 LAB — CREATININE, URINE, RANDOM: CREATININE, URINE: 154.22 mg/dL

## 2016-01-28 LAB — SODIUM, URINE, RANDOM: SODIUM UR: 36 mmol/L

## 2016-01-28 MED ORDER — SODIUM CHLORIDE 0.9 % IV BOLUS (SEPSIS)
1500.0000 mL | Freq: Once | INTRAVENOUS | Status: AC
  Administered 2016-01-28: 1500 mL via INTRAVENOUS

## 2016-01-28 MED ORDER — METOPROLOL SUCCINATE ER 50 MG PO TB24
50.0000 mg | ORAL_TABLET | Freq: Every day | ORAL | Status: DC
  Administered 2016-01-29 – 2016-02-01 (×5): 50 mg via ORAL
  Filled 2016-01-28 (×4): qty 1
  Filled 2016-01-28: qty 2

## 2016-01-28 MED ORDER — DEXTROSE 5 % IV SOLN
500.0000 mg | Freq: Once | INTRAVENOUS | Status: AC
  Administered 2016-01-28: 500 mg via INTRAVENOUS
  Filled 2016-01-28: qty 500

## 2016-01-28 MED ORDER — IPRATROPIUM BROMIDE 0.02 % IN SOLN
0.5000 mg | Freq: Four times a day (QID) | RESPIRATORY_TRACT | Status: DC
  Administered 2016-01-29 (×4): 0.5 mg via RESPIRATORY_TRACT
  Filled 2016-01-28 (×4): qty 2.5

## 2016-01-28 MED ORDER — DEXTROSE 5 % IV SOLN: 1.0000 g | INTRAVENOUS | Status: DC

## 2016-01-28 MED ORDER — MIRABEGRON ER 50 MG PO TB24
50.0000 mg | ORAL_TABLET | Freq: Every day | ORAL | Status: DC
Start: 2016-01-29 — End: 2016-02-02
  Administered 2016-01-29 – 2016-02-02 (×5): 50 mg via ORAL
  Filled 2016-01-28 (×6): qty 1

## 2016-01-28 MED ORDER — SODIUM CHLORIDE 0.9 % IV BOLUS (SEPSIS)
500.0000 mL | Freq: Once | INTRAVENOUS | Status: AC
  Administered 2016-01-28: 500 mL via INTRAVENOUS

## 2016-01-28 MED ORDER — DM-GUAIFENESIN ER 30-600 MG PO TB12
1.0000 | ORAL_TABLET | Freq: Two times a day (BID) | ORAL | Status: DC
  Administered 2016-01-28 – 2016-01-29 (×3): 1 via ORAL
  Filled 2016-01-28 (×3): qty 1

## 2016-01-28 MED ORDER — LEVALBUTEROL HCL 0.63 MG/3ML IN NEBU
0.6300 mg | INHALATION_SOLUTION | Freq: Four times a day (QID) | RESPIRATORY_TRACT | Status: DC
  Administered 2016-01-29 (×4): 0.63 mg via RESPIRATORY_TRACT
  Filled 2016-01-28 (×9): qty 3

## 2016-01-28 MED ORDER — DONEPEZIL HCL 5 MG PO TABS
5.0000 mg | ORAL_TABLET | Freq: Every day | ORAL | Status: DC
  Administered 2016-01-29 – 2016-02-02 (×6): 5 mg via ORAL
  Filled 2016-01-28 (×6): qty 1

## 2016-01-28 MED ORDER — AZITHROMYCIN 500 MG IV SOLR: 500.0000 mg | INTRAVENOUS | Status: DC

## 2016-01-28 MED ORDER — INSULIN ASPART 100 UNIT/ML ~~LOC~~ SOLN
0.0000 [IU] | Freq: Three times a day (TID) | SUBCUTANEOUS | Status: DC
  Administered 2016-01-29 (×3): 3 [IU] via SUBCUTANEOUS
  Administered 2016-01-30: 2 [IU] via SUBCUTANEOUS
  Administered 2016-01-30: 5 [IU] via SUBCUTANEOUS
  Administered 2016-01-30: 3 [IU] via SUBCUTANEOUS
  Administered 2016-01-31: 2 [IU] via SUBCUTANEOUS
  Administered 2016-01-31: 5 [IU] via SUBCUTANEOUS
  Administered 2016-01-31: 3 [IU] via SUBCUTANEOUS
  Administered 2016-02-01: 2 [IU] via SUBCUTANEOUS
  Administered 2016-02-01: 3 [IU] via SUBCUTANEOUS
  Administered 2016-02-01: 5 [IU] via SUBCUTANEOUS
  Administered 2016-02-02: 1 [IU] via SUBCUTANEOUS
  Administered 2016-02-02: 2 [IU] via SUBCUTANEOUS
  Filled 2016-01-28: qty 1

## 2016-01-28 MED ORDER — FINASTERIDE 5 MG PO TABS
5.0000 mg | ORAL_TABLET | Freq: Every day | ORAL | Status: DC
  Administered 2016-01-29 – 2016-02-02 (×5): 5 mg via ORAL
  Filled 2016-01-28 (×6): qty 1

## 2016-01-28 MED ORDER — ALBUTEROL SULFATE (2.5 MG/3ML) 0.083% IN NEBU
5.0000 mg | INHALATION_SOLUTION | Freq: Once | RESPIRATORY_TRACT | Status: AC
  Administered 2016-01-28: 5 mg via RESPIRATORY_TRACT
  Filled 2016-01-28: qty 6

## 2016-01-28 MED ORDER — IPRATROPIUM BROMIDE 0.02 % IN SOLN
0.5000 mg | RESPIRATORY_TRACT | Status: DC
  Administered 2016-01-28: 0.5 mg via RESPIRATORY_TRACT
  Filled 2016-01-28: qty 2.5

## 2016-01-28 MED ORDER — METHYLPREDNISOLONE SODIUM SUCC 125 MG IJ SOLR
60.0000 mg | Freq: Two times a day (BID) | INTRAMUSCULAR | Status: DC
  Administered 2016-01-28 – 2016-01-29 (×2): 60 mg via INTRAVENOUS
  Filled 2016-01-28 (×2): qty 2

## 2016-01-28 MED ORDER — LEVALBUTEROL HCL 0.63 MG/3ML IN NEBU
0.6300 mg | INHALATION_SOLUTION | Freq: Four times a day (QID) | RESPIRATORY_TRACT | Status: DC | PRN
  Filled 2016-01-28 (×2): qty 3

## 2016-01-28 MED ORDER — ENOXAPARIN SODIUM 40 MG/0.4ML ~~LOC~~ SOLN
40.0000 mg | SUBCUTANEOUS | Status: DC
  Administered 2016-01-28: 40 mg via SUBCUTANEOUS
  Filled 2016-01-28 (×3): qty 0.4

## 2016-01-28 MED ORDER — SODIUM CHLORIDE 0.9 % IV SOLN
INTRAVENOUS | Status: DC
  Administered 2016-01-29: 04:00:00 via INTRAVENOUS

## 2016-01-28 MED ORDER — DEXTROSE 5 % IV SOLN
1.0000 g | Freq: Once | INTRAVENOUS | Status: AC
  Administered 2016-01-28: 1 g via INTRAVENOUS
  Filled 2016-01-28: qty 10

## 2016-01-28 MED ORDER — ASPIRIN EC 325 MG PO TBEC
325.0000 mg | DELAYED_RELEASE_TABLET | Freq: Every day | ORAL | Status: DC
  Administered 2016-01-28 – 2016-02-02 (×6): 325 mg via ORAL
  Filled 2016-01-28 (×6): qty 1

## 2016-01-28 MED ORDER — INSULIN ASPART 100 UNIT/ML ~~LOC~~ SOLN
0.0000 [IU] | Freq: Every day | SUBCUTANEOUS | Status: DC
  Administered 2016-01-29: 4 [IU] via SUBCUTANEOUS
  Administered 2016-01-29: 2 [IU] via SUBCUTANEOUS
  Administered 2016-01-31: 4 [IU] via SUBCUTANEOUS
  Administered 2016-02-01: 2 [IU] via SUBCUTANEOUS
  Filled 2016-01-28: qty 1

## 2016-01-28 NOTE — ED Notes (Signed)
Dr. Blaine Hamper paged to [phone] @ [phone].

## 2016-01-28 NOTE — ED Notes (Signed)
Patient back from CT.

## 2016-01-28 NOTE — ED Notes (Signed)
MD at bedside. 

## 2016-01-28 NOTE — ED Notes (Signed)
Pt arrives from home via [facility] reporting increased cough since yesterday.  Pt's spouse reports increased weakness today.  EMS reports pt had fall d/t weakness, pt denies LOC, trauma or injury.  EMS reports pt 89% on RA, 94%.  EMS  reports giving:  5mg  Albuterol 125 Solu Medrol

## 2016-01-28 NOTE — ED Provider Notes (Signed)
CSN: [id number]     Arrival date & time 01/28/16  1656 History   First MD Initiated Contact with Patient 01/28/16 1717     Chief Complaint  Patient presents with  . Cough     (Consider location/radiation/quality/duration/timing/severity/associated sxs/prior Treatment) Patient is a 82 y.o. male presenting with cough. The history is provided by the patient.  Cough Associated symptoms: fever and shortness of breath   Associated symptoms: no chest pain, no chills, no headaches, no rash and no sore throat   Patient c/o productive cough the past few days, episodic, persistent, moderate.  No specific exacerbating or alleviating factors. Mild sob.  +dyspnea w exertion. Spouse notes pt is extremely sedentary at baseline.  At baseline walks limited distance w walker. Pt denies chest pain. No sore throat. Subjective fever. No chills. Denies vomiting or diarrhea. No abd pain.        Past Medical History  Diagnosis Date  . Hypertension   . Hyperlipidemia   . Chronic atrial fibrillation ([place])   . Urinary retention foley last changed 3 weeks ago  . Arthritis   . Sleep apnea     no cpap used, sleep study was several yrs ago   Past Surgical History  Procedure Laterality Date  . Back surgery  yrs ago    lower back surgery  . Tonsillectomy  as child  . Prostate surgery  yrs ago, no cancer  . Transurethral resection of prostate  02/22/2012    Procedure: TRANSURETHRAL RESECTION OF THE PROSTATE WITH GYRUS INSTRUMENTS;  Surgeon: Molli Hazard, MD;  Location: [facility];  Service: Urology;  Laterality: N/A;       Family History  Problem Relation Age of Onset  . Heart attack Father    Social History  Substance Use Topics  . Smoking status: Former Smoker -- 0.50 packs/day for 50 years    Types: Cigarettes  . Smokeless tobacco: Never Used  . Alcohol Use: Yes     Comment: rare use    Review of Systems  Constitutional: Positive for fever. Negative for chills.  HENT: Negative for sore  throat.   Eyes: Negative for redness.  Respiratory: Positive for cough and shortness of breath.   Cardiovascular: Negative for chest pain and leg swelling.  Gastrointestinal: Negative for vomiting, abdominal pain and diarrhea.  Genitourinary: Negative for dysuria and flank pain.  Musculoskeletal: Negative for back pain and neck pain.  Skin: Negative for rash.  Neurological: Negative for headaches.  Hematological: Does not bruise/bleed easily.  Psychiatric/Behavioral: Negative for confusion.      Allergies  Sulfa antibiotics  Home Medications   Prior to Admission medications   Medication Sig Start Date End Date Taking? Authorizing Provider  aspirin EC 325 MG tablet Take 1 tablet (325 mg total) by mouth daily. 10/16/15   Sherren Mocha, MD  finasteride (PROSCAR) 5 MG tablet Take 5 mg by mouth daily.    Historical Provider, MD  levalbuterol Penne Lash) 0.63 MG/3ML nebulizer solution Take 3 mLs (0.63 mg total) by nebulization every 6 (six) hours as needed for wheezing or shortness of breath. 04/22/15   Belkys A Regalado, MD  metoprolol succinate (TOPROL-XL) 50 MG 24 hr tablet Take 1 tablet (50 mg total) by mouth at bedtime. 10/16/15   Sherren Mocha, MD   BP 127/72 mmHg  Pulse 114  Temp(Src) 100.4 F (38 C) (Rectal)  Resp 31  Wt 94.348 kg  SpO2 93% Physical Exam  Constitutional: He is oriented to person, place, and time. He  appears well-developed and well-nourished. No distress.  HENT:  Mouth/Throat: Oropharynx is clear and moist.  Eyes: Conjunctivae are normal. No scleral icterus.  Neck: Neck supple. No tracheal deviation present.  No stiffness or rigidity  Cardiovascular: Normal rate, regular rhythm, normal heart sounds and intact distal pulses.  Exam reveals no gallop and no friction rub.   No murmur heard. Pulmonary/Chest: No accessory muscle usage. He has wheezes. He has rales.  Mild increased wob. Rhonchi.   Abdominal: Soft. Bowel sounds are normal. He exhibits no  distension. There is no tenderness.  obese  Genitourinary:  No cva tenderness  Musculoskeletal: Normal range of motion. He exhibits no tenderness.  Neurological: He is alert and oriented to person, place, and time.  Skin: Skin is warm and dry. No rash noted. He is not diaphoretic.  Psychiatric: He has a normal mood and affect.  Nursing note and vitals reviewed.   ED Course  Procedures (including critical care time) Labs Review   Results for orders placed or performed during the hospital encounter of 01/28/16  Blood culture (routine x 2)  Result Value Ref Range   Specimen Description BLOOD RIGHT ANTECUBITAL    Special Requests BOTTLES DRAWN AEROBIC AND ANAEROBIC 5CC    Culture NO GROWTH < 24 HOURS    Report Status PENDING   Blood culture (routine x 2)  Result Value Ref Range   Specimen Description BLOOD LEFT HAND    Special Requests IN PEDIATRIC BOTTLE 4CC    Culture NO GROWTH < 24 HOURS    Report Status PENDING   Culture, respiratory (NON-Expectorated)  Result Value Ref Range   Specimen Description SPUTUM    Special Requests NONE    Gram Stain      NO WBC SEEN FEW SQUAMOUS EPITHELIAL CELLS PRESENT NO ORGANISMS SEEN THIS SPECIMEN IS ACCEPTABLE FOR SPUTUM CULTURE Performed at [facility]    Culture PENDING    Report Status PENDING   MRSA PCR Screening  Result Value Ref Range   MRSA by PCR NEGATIVE NEGATIVE  Comprehensive metabolic panel  Result Value Ref Range   Sodium 140 135 - 145 mmol/L   Potassium 4.3 3.5 - 5.1 mmol/L   Chloride 100 (L) 101 - 111 mmol/L   CO2 25 22 - 32 mmol/L   Glucose, Bld 174 (H) 65 - 99 mg/dL   BUN 21 (H) 6 - 20 mg/dL   Creatinine, Ser 1.95 (H) 0.61 - 1.24 mg/dL   Calcium 9.6 8.9 - 10.3 mg/dL   Total Protein 8.0 6.5 - 8.1 g/dL   Albumin 4.0 3.5 - 5.0 g/dL   AST 30 15 - 41 U/L   ALT 26 17 - 63 U/L   Alkaline Phosphatase 75 38 - 126 U/L   Total Bilirubin 0.9 0.3 - 1.2 mg/dL   GFR calc non Af Amer 29 (L) >60 mL/min   GFR calc  Af Amer 34 (L) >60 mL/min   Anion gap 15 5 - 15  CBC with Differential  Result Value Ref Range   WBC 15.8 (H) 4.0 - 10.5 K/uL   RBC 4.45 4.22 - 5.81 MIL/uL   Hemoglobin 14.3 13.0 - 17.0 g/dL   HCT 42.9 39.0 - 52.0 %   MCV 96.4 78.0 - 100.0 fL   MCH 32.1 26.0 - 34.0 pg   MCHC 33.3 30.0 - 36.0 g/dL   RDW 13.5 11.5 - 15.5 %   Platelets 163 150 - 400 K/uL   Neutrophils Relative % 87 %  Neutro Abs 13.7 (H) 1.7 - 7.7 K/uL   Lymphocytes Relative 8 %   Lymphs Abs 1.3 0.7 - 4.0 K/uL   Monocytes Relative 4 %   Monocytes Absolute 0.6 0.1 - 1.0 K/uL   Eosinophils Relative 1 %   Eosinophils Absolute 0.2 0.0 - 0.7 K/uL   Basophils Relative 0 %   Basophils Absolute 0.0 0.0 - 0.1 K/uL  Urinalysis, Routine w reflex microscopic (not at [facility])  Result Value Ref Range   Color, Urine YELLOW YELLOW   APPearance CLEAR CLEAR   Specific Gravity, Urine 1.018 1.005 - 1.030   pH 5.0 5.0 - 8.0   Glucose, UA 100 (A) NEGATIVE mg/dL   Hgb urine dipstick NEGATIVE NEGATIVE   Bilirubin Urine NEGATIVE NEGATIVE   Ketones, ur NEGATIVE NEGATIVE mg/dL   Protein, ur NEGATIVE NEGATIVE mg/dL   Nitrite NEGATIVE NEGATIVE   Leukocytes, UA NEGATIVE NEGATIVE  Creatinine, urine, random  Result Value Ref Range   Creatinine, Urine 154.22 mg/dL  Sodium, urine, random  Result Value Ref Range   Sodium, Ur 36 mmol/L  Influenza panel by PCR (type A & B, H1N1)  Result Value Ref Range   Influenza A By PCR NEGATIVE NEGATIVE   Influenza B By PCR NEGATIVE NEGATIVE   H1N1 flu by pcr NOT DETECTED NOT DETECTED  Strep pneumoniae urinary antigen  Result Value Ref Range   Strep Pneumo Urinary Antigen NEGATIVE NEGATIVE  Legionella antigen, urine (not at [facility])  Result Value Ref Range   Specimen Description URINE, CATHETERIZED    Special Requests NONE    Legionella Antigen, Urine      Negative for Legionella pneumophila serogroup 1                                                              Legionella pneumophila serogroup 1  antigen can be detected in urine within 2 to 3 days of infection and may persist even after treatment. This  assay does not detect other Legionella species or serogroups. Performed at [facility]    Report Status 01/29/2016 FINAL   Lactic acid, plasma  Result Value Ref Range   Lactic Acid, Venous 3.5 (HH) 0.5 - 2.0 mmol/L  Lactic acid, plasma  Result Value Ref Range   Lactic Acid, Venous 4.5 (HH) 0.5 - 2.0 mmol/L  Procalcitonin  Result Value Ref Range   Procalcitonin 0.12 ng/mL  Protime-INR  Result Value Ref Range   Prothrombin Time 15.6 (H) 11.6 - 15.2 seconds   INR 1.23 0.00 - 1.49  APTT  Result Value Ref Range   aPTT 27 24 - 37 seconds  Troponin I (q 6hr x 3)  Result Value Ref Range   Troponin I <0.03 <0.031 ng/mL  Troponin I (q 6hr x 3)  Result Value Ref Range   Troponin I 0.03 <0.031 ng/mL  Troponin I (q 6hr x 3)  Result Value Ref Range   Troponin I <0.03 <0.031 ng/mL  Lipid panel  Result Value Ref Range   Cholesterol 150 0 - 200 mg/dL   Triglycerides 44 <150 mg/dL   HDL 35 (L) >40 mg/dL   Total CHOL/HDL Ratio 4.3 RATIO   VLDL 9 0 - 40 mg/dL   LDL Cholesterol 106 (H) 0 - 99 mg/dL  Lactic acid, plasma  Result Value Ref Range   Lactic Acid, Venous 3.0 (HH) 0.5 - 2.0 mmol/L  Lactic acid, plasma  Result Value Ref Range   Lactic Acid, Venous 1.9 0.5 - 2.0 mmol/L  TSH  Result Value Ref Range   TSH 0.650 0.350 - 4.500 uIU/mL  Procalcitonin - Baseline  Result Value Ref Range   Procalcitonin 0.19 ng/mL  Glucose, capillary  Result Value Ref Range   Glucose-Capillary 202 (H) 65 - 99 mg/dL  I-Stat CG4 Lactic Acid, ED (Not at [facility])  Result Value Ref Range   Lactic Acid, Venous 2.79 (HH) 0.5 - 2.0 mmol/L   Comment NOTIFIED PHYSICIAN   I-Stat arterial blood gas, ED  Result Value Ref Range   pH, Arterial 7.290 (L) 7.350 - 7.450   pCO2 arterial 35.9 35.0 - 45.0 mmHg   pO2, Arterial 74.0 (L) 80.0 - 100.0 mmHg   Bicarbonate 17.3 (L) 20.0 - 24.0 mEq/L   TCO2  18 0 - 100 mmol/L   O2 Saturation 93.0 %   Acid-base deficit 8.0 (H) 0.0 - 2.0 mmol/L   Patient temperature 98.6 F    Collection site RADIAL, ALLEN'S TEST ACCEPTABLE    Drawn by RT    Sample type ARTERIAL   CBG monitoring, ED  Result Value Ref Range   Glucose-Capillary 336 (H) 65 - 99 mg/dL  CBG monitoring, ED  Result Value Ref Range   Glucose-Capillary 223 (H) 65 - 99 mg/dL   Comment 1 Notify RN    Comment 2 Document in Chart   CBG monitoring, ED  Result Value Ref Range   Glucose-Capillary 239 (H) 65 - 99 mg/dL   Dg Chest 2 View  01/29/2016  CLINICAL DATA:  Acute onset of shortness of breath, cough and congestion. Initial encounter. EXAM: CHEST  2 VIEW COMPARISON:  Chest radiograph from 01/28/2016 FINDINGS: The lungs are well-aerated. Vascular congestion is noted. Mildly increased interstitial markings could reflect minimal interstitial edema. There is no evidence of pleural effusion or pneumothorax. The heart is normal in size; the mediastinal contour is within normal limits. No acute osseous abnormalities are seen. IMPRESSION: Vascular congestion noted. Mildly increased interstitial markings could reflect minimal interstitial edema. Electronically Signed   By: Garald Balding M.D.   On: 01/29/2016 02:43   Dg Chest 2 View  01/28/2016  CLINICAL DATA:  Productive cough for 2 days. Shortness of breath and weakness today. Initial encounter. EXAM: CHEST  2 VIEW COMPARISON:  PA and lateral chest 04/21/2015 and 12/28/2012. FINDINGS: The lungs are clear. Heart size is normal. There is no pneumothorax or pleural effusion. No focal bony abnormality is identified. IMPRESSION: No acute disease. Electronically Signed   By: Inge Rise M.D.   On: 01/28/2016 18:09   Ct Head Wo Contrast  01/28/2016  CLINICAL DATA:  Acute onset of cough. Worsening generalized weakness and recent falls. Initial encounter. EXAM: CT HEAD WITHOUT CONTRAST TECHNIQUE: Contiguous axial images were obtained from the base of the  skull through the vertex without intravenous contrast. COMPARISON:  CT of the head performed 05/25/2013 FINDINGS: There is no evidence of acute infarction, mass lesion, or intra- or extra-axial hemorrhage on CT. Prominence of the ventricles and sulci reflects moderate cortical volume loss. Mild cerebellar atrophy is noted. Diffuse periventricular and subcortical white matter change likely reflects small vessel ischemic microangiopathy. Chronic ischemic change is noted at the basal ganglia bilaterally. The brainstem and fourth ventricle are within normal limits. The cerebral hemispheres demonstrate grossly normal gray-white differentiation. No mass effect or  midline shift is seen. There is no evidence of fracture; visualized osseous structures are unremarkable in appearance. The orbits are within normal limits. The paranasal sinuses and mastoid air cells are well-aerated. No significant soft tissue abnormalities are seen. IMPRESSION: 1. No acute intracranial pathology seen on CT. 2. Moderate cortical volume loss and diffuse small vessel ischemic microangiopathy. 3. Chronic ischemic change at the basal ganglia bilaterally. Electronically Signed   By: Garald Balding M.D.   On: 01/28/2016 23:24       I have personally reviewed and evaluated these images and lab results as part of my medical decision-making.   EKG Interpretation   Date/Time:  Tuesday January 28 2016 18:13:12 EST Ventricular Rate:  119 PR Interval:    QRS Duration: 85 QT Interval:  346 QTC Calculation: 487 R Axis:   17 Text Interpretation:  Atrial fibrillation Low voltage, precordial leads  Nonspecific T abnormalities, lateral leads Borderline prolonged QT  interval Confirmed by Ashok Cordia  MD, Lennette Bihari ([id number]) on 01/28/2016 6:43:48 PM      MDM   Iv ns. o2 [place]. Labs. Cxr.  Reviewed nursing notes and prior charts for additional history.   Lactate high, cultures sent, ivf.    Room air sat when up at bedside, low 80's.  On 2 liters  improves to mid 90's.   After cultures sent iv abx.  pts chest xray does not show infiltrate, however has productive cough, fever, hypoxia, and rales/rhonci on exam, clinically c/w pneumonia.    Alb neb re mild wheezing.   Hospitalists paged for admission re: suspected CAP.    2nd lactate higher than first, still below 4.  Additional ns, admitted team will follow subsequent lactates, response to ivf, and additional ivf orders.  Pt remains alert, mildly dyspneic but able to talk in sentences.   No cp.     Lajean Saver, MD 01/29/16 [phone]

## 2016-01-28 NOTE — H&P (Addendum)
[facility] History and Physical  [name] [name] DOB: 25-Apr-1926 DOA: 01/28/2016  Referring physician: ED physician PCP: Haywood Pao, MD  Specialists:   Chief Complaint: Cough and shortness of breath  HPI: [name] [name] is a 82 y.o. male with PMH of hypertension, hyperlipidemia, diabetes mellitus, atrial fibrillation not taking blood thinner, OSA not on CPAP, arthritis, chronic kidney disease-stage III, who presents with shortness of breath and cough.  Patient reports that he has been having shortness of breath and dry cough since yesterday. He denies fever, chills and chest pain. He also has runny nose, but no sore throat. No tenderness over calf areas. Patient does not have abdominal pain, diarrhea, symptoms of UTI. Pt reports that he ha has a generalized weakness, and had several fall in the past several month, and his last fall happened today. No unilateral weakness, numbness or tingling sensations in his extremities. No vision change or hearing loss.  In ED, patient was found to have WBC 15.8, lactate 2.79, temperature 100.4, A. fib with RVR with heart rate 110-120, oxygen desaturation to 84%, worsening renal function, negative chest x-ray for acute abnormalities. Patient is admit to inpatient for further eval and treatment.  EKG: Independently reviewed. QTC 487, atrial fibrillation with tachycardia, no skin change  Where does patient live?   At home    Can patient participate in ADLs? Some   Review of Systems:   General: has fevers, no chills, no changes in body weight, has poor appetite, has fatigue HEENT: no blurry vision, hearing changes or sore throat Pulm: has dyspnea, coughing, no wheezing CV: no chest pain, palpitations Abd: no nausea, vomiting, abdominal pain, diarrhea, constipation GU: no dysuria, burning on urination, increased urinary frequency, hematuria  Ext: no leg edema Neuro: no unilateral weakness, numbness, or tingling, no vision  change or hearing loss Skin: no rash. Has lower leg venous insufficiency. MSK: No muscle spasm, no deformity, no limitation of range of movement in spin Heme: No easy bruising.  Travel history: No recent long distant travel.  Allergy:  Allergies  Allergen Reactions  . Sulfa Antibiotics Swelling    hands    Past Medical History  Diagnosis Date  . Hypertension   . Hyperlipidemia   . Chronic atrial fibrillation ([place])   . Urinary retention foley last changed 3 weeks ago  . Arthritis   . Sleep apnea     no cpap used, sleep study was several yrs ago  . Diabetes mellitus without complication [facility])     Past Surgical History  Procedure Laterality Date  . Back surgery  yrs ago    lower back surgery  . Tonsillectomy  as child  . Prostate surgery  yrs ago, no cancer  . Transurethral resection of prostate  02/22/2012    Procedure: TRANSURETHRAL RESECTION OF THE PROSTATE WITH GYRUS INSTRUMENTS;  Surgeon: Molli Hazard, MD;  Location: [facility] ORS;  Service: Urology;  Laterality: N/A;        Social History:  reports that he has quit smoking. His smoking use included Cigarettes. He has a 25 pack-year smoking history. He has never used smokeless tobacco. He reports that he drinks alcohol. He reports that he does not use illicit drugs.  Family History:  Family History  Problem Relation Age of Onset  . Heart attack Father      Prior to Admission medications   Medication Sig Start Date End Date Taking? Authorizing Provider  aspirin EC 325 MG tablet Take 1 tablet (325  mg total) by mouth daily. 10/16/15   Sherren Mocha, MD  finasteride (PROSCAR) 5 MG tablet Take 5 mg by mouth daily.    Historical Provider, MD  levalbuterol Penne Lash) 0.63 MG/3ML nebulizer solution Take 3 mLs (0.63 mg total) by nebulization every 6 (six) hours as needed for wheezing or shortness of breath. 04/22/15   Belkys A Regalado, MD  metoprolol succinate (TOPROL-XL) 50 MG 24 hr tablet Take 1 tablet (50 mg total) by  mouth at bedtime. 10/16/15   Sherren Mocha, MD    Physical Exam: Filed Vitals:   01/28/16 2025 01/28/16 2045 01/28/16 2100 01/28/16 2115  BP:  108/59 111/59 109/68  Pulse:  112 113 109  Temp:      TempSrc:      Resp:  21 26 22   Weight:      SpO2: 94% 91% 92% 90%   General: Not in acute distress HEENT:       Eyes: PERRL, EOMI, no scleral icterus.       ENT: No discharge from the ears and nose, no pharynx injection, no tonsillar enlargement.        Neck: No JVD, no bruit, no mass felt. Heme: No neck lymph node enlargement. Cardiac: S1/S2, irregularly irregular rhythm, tachycardia, No murmurs, No gallops or rubs. Pulm: has rhonchi bilaterally, No rales, wheezing, or rubs. Abd: Soft, nondistended, nontender, no rebound pain, no organomegaly, BS present. Ext: No pitting leg edema bilaterally. 2+DP/PT pulse bilaterally. Musculoskeletal: No joint deformities, No joint redness or warmth, no limitation of ROM in spin. Skin: No rashes. Has venous insufficiency ion legs, left is worse than the right. Neuro: Alert, oriented X3, cranial nerves II-XII grossly intact, moves all extremities. Psych: Patient is not psychotic, no suicidal or hemocidal ideation.  Labs on Admission:  Basic Metabolic Panel:  Recent Labs Lab 01/28/16 1758  NA 140  K 4.3  CL 100*  CO2 25  GLUCOSE 174*  BUN 21*  CREATININE 1.95*  CALCIUM 9.6   Liver Function Tests:  Recent Labs Lab 01/28/16 1758  AST 30  ALT 26  ALKPHOS 75  BILITOT 0.9  PROT 8.0  ALBUMIN 4.0   No results for input(s): LIPASE, AMYLASE in the last 168 hours. No results for input(s): AMMONIA in the last 168 hours. CBC:  Recent Labs Lab 01/28/16 1758  WBC 15.8*  NEUTROABS 13.7*  HGB 14.3  HCT 42.9  MCV 96.4  PLT 163   Cardiac Enzymes: No results for input(s): CKTOTAL, CKMB, CKMBINDEX, TROPONINI in the last 168 hours.  BNP (last 3 results)  Recent Labs  04/21/15 0524  BNP 83.1    ProBNP (last 3 results) No results  for input(s): PROBNP in the last 8760 hours.  CBG: No results for input(s): GLUCAP in the last 168 hours.  Radiological Exams on Admission: Dg Chest 2 View  01/28/2016  CLINICAL DATA:  Productive cough for 2 days. Shortness of breath and weakness today. Initial encounter. EXAM: CHEST  2 VIEW COMPARISON:  PA and lateral chest 04/21/2015 and 12/28/2012. FINDINGS: The lungs are clear. Heart size is normal. There is no pneumothorax or pleural effusion. No focal bony abnormality is identified. IMPRESSION: No acute disease. Electronically Signed   By: Inge Rise M.D.   On: 01/28/2016 18:09    Assessment/Plan Principal Problem:   Acute respiratory failure with hypoxia (HCC) Active Problems:   Hyperlipidemia   HYPERTENSION, BENIGN   Atrial fibrillation with RVR ([place])   Fall   Acute renal failure superimposed on stage  3 chronic kidney disease (HCC)   BPH (benign prostatic hypertrophy)   Diabetes mellitus without complication (HCC)   Elevated lactic acid level   Sepsis (HCC)  Acute respiratory failure with hypoxia ([place]): This is likely due to acute bronchitis versus early stage for pneumonia. Chest x-ray is negative for infiltration. Patient has bilateral rhonchi, indicating possible bronchitis. Patient does not have chest pain, less likely to have pulmonary embolism. No history of congestive heart failure.  - Will admit to SDU (pt has Afib with RVR, may need Cardizem gtt if getting worse) - IV Rocephin and azithromycin - Mucinex for cough  - Atrovent and Xopenex Neb prn for SOB - IV solumedrol 60 mg bid - Urine legionella and S. pneumococcal antigen - Follow up blood culture x2, sputum culture, plus Flu pcr  Sepsis due to possible bronchitis versus pneumonia: Patient is septic on admission with leukocytosis, fever, tachycardia, tachypnea and elevated lactate. Hemodynamically stable. -on IV Abx as above -will get Procalcitonin and trend lactic acid levels per sepsis protocol. -IVF:  2.5L of NS bolus in ED, followed by 75 cc/h   Addendum: Lactate trending up 2.79-->3.5-->4.5. Stat ABG showed pH 7.29, pCO2 35.9 and pO2 74. PCCM was consulted-->broadened Abx to Vanco and unasyn  Atrial fibrillation with RVR ([place]): CHA2DS2-VASc Score is 4, needs oral anticoagulation. pt was on Xarelto in the past, but had not been compliant to his Xarelto. He was seen by his cardiologist, Dr. Burt Knack on 10/16/15. Per Dr. Antionette Char note, pt has been not complaint to his Xarelto in the past. He was restarted and stopped taking Xarelto multiple times. Dr. Burt Knack thought that starting and stopping this medication might even increase his stroke risk. Pt was therefore prescribed aspirin 325 mg daily by Dr. Burt Knack. Now presents with Afib with RVR. HR is ~110s. No chest pain. Most likely due to hypoxia -continue ASA 325 mg daily -On metorprolol -will start IV cardizem gtt if HR is up>120 (not ordered yet) -trop x 3  HLD: Last LDL was not on record. Not taking meds at home. -Check FLP   HTN: -on metoprolol  AoCKD-III: Baseline Cre is 1.5-1.6, his Cre is 1.95 on admission. Likely due to prerenal secondary to dehydration - IVF as above - Check FeNa - Follow up renal function by BMP - Avoid ACEI and NSAIDs  BPH (benign prostatic hypertrophy): -continue proscar and Myrbetriq  DM-II: Last A1c 7.5 on 04/21/15, not well controled. Patient is not taking meds at home. Blood sugar is 174 on admission -SSI -Check A1c  Fall: No focal neurologic findings on physical examination. Likely due to multifactorial etiology, particularly deconditioning. -Will get CT head without contrast to rule out any intracranial injury. -PT/OT   DVT ppx:  SQ Lovenox  Code Status: Full code Family Communication: None at bed side. Disposition Plan: Admit to inpatient   Date of Service 01/28/2016    Ivor Costa [facility] Pager [phone]  If 7PM-7AM, please contact night-coverage [email or web address] Password  [facility] 01/28/2016, 9:35 PM

## 2016-01-28 NOTE — ED Notes (Signed)
Pt's O2 dropped to 84% RA when standing to weigh.  O2 sats recovered to 93% upon returning to bed.  Dr. Ashok Cordia made aware.

## 2016-01-28 NOTE — Consult Note (Signed)
Pharmacy Antibiotic Note  [name] [name] is a 82 y.o. male admitted on 01/28/2016 with pneumonia.  Pharmacy has been consulted for ceftriaxone and azithromycin dosing.  Plan: Ceftriaxone 1 g IV q24h Azithromycin 500 mg IV q24h Monitor cultures, LOT, clinical progression  Weight: 208 lb (94.348 kg)  Temp (24hrs), Avg:99.6 F (37.6 C), Min:98.7 F (37.1 C), Max:100.4 F (38 C)   Recent Labs Lab 01/28/16 1756 01/28/16 1758  WBC  --  15.8*  LATICACIDVEN 2.79*  --     CrCl cannot be calculated (Patient has no serum creatinine result on file.).    Allergies  Allergen Reactions  . Sulfa Antibiotics Swelling    hands    Antimicrobials this admission: Ceftriaxone 2/7 >>  Azithromycin 2/7 >>   Dose adjustments this admission: n/a  Microbiology results: 2/7 BCx: sent 2/7 UCx:    Thank you for allowing pharmacy to be a part of this patient's care.  Joya San, PharmD Clinical Pharmacy Resident Pager # [phone] 01/28/2016 6:48 PM

## 2016-01-29 ENCOUNTER — Other Ambulatory Visit: Payer: Self-pay | Admitting: Family Medicine

## 2016-01-29 ENCOUNTER — Inpatient Hospital Stay (HOSPITAL_COMMUNITY): Payer: Medicare Other

## 2016-01-29 DIAGNOSIS — E872 Acidosis: Secondary | ICD-10-CM

## 2016-01-29 DIAGNOSIS — J9601 Acute respiratory failure with hypoxia: Secondary | ICD-10-CM

## 2016-01-29 DIAGNOSIS — J208 Acute bronchitis due to other specified organisms: Secondary | ICD-10-CM

## 2016-01-29 DIAGNOSIS — J189 Pneumonia, unspecified organism: Secondary | ICD-10-CM

## 2016-01-29 DIAGNOSIS — N400 Enlarged prostate without lower urinary tract symptoms: Secondary | ICD-10-CM

## 2016-01-29 DIAGNOSIS — I4891 Unspecified atrial fibrillation: Secondary | ICD-10-CM

## 2016-01-29 LAB — LIPID PANEL
Cholesterol: 150 mg/dL (ref 0–200)
HDL: 35 mg/dL — AB (ref >40)
LDL CALC: 106 mg/dL — AB (ref 0–99)
TRIGLYCERIDES: 44 mg/dL (ref <150)
Total CHOL/HDL Ratio: 4.3 RATIO
VLDL: 9 mg/dL (ref 0–40)

## 2016-01-29 LAB — TSH: TSH: 0.65 u[IU]/mL (ref 0.350–4.500)

## 2016-01-29 LAB — CBG MONITORING, ED
GLUCOSE-CAPILLARY: 223 mg/dL — AB (ref 65–99)
Glucose-Capillary: 239 mg/dL — ABNORMAL HIGH (ref 65–99)
Glucose-Capillary: 336 mg/dL — ABNORMAL HIGH (ref 65–99)

## 2016-01-29 LAB — INFLUENZA PANEL BY PCR (TYPE A & B)
H1N1 flu by pcr: NOT DETECTED
INFLAPCR: NEGATIVE
Influenza B By PCR: NEGATIVE

## 2016-01-29 LAB — GLUCOSE, CAPILLARY
Glucose-Capillary: 202 mg/dL — ABNORMAL HIGH (ref 65–99)
Glucose-Capillary: 220 mg/dL — ABNORMAL HIGH (ref 65–99)
Glucose-Capillary: 223 mg/dL — ABNORMAL HIGH (ref 65–99)

## 2016-01-29 LAB — LEGIONELLA ANTIGEN, URINE

## 2016-01-29 LAB — LACTIC ACID, PLASMA
LACTIC ACID, VENOUS: 3 mmol/L — AB (ref 0.5–2.0)
LACTIC ACID, VENOUS: 1.9 mmol/L (ref 0.5–2.0)

## 2016-01-29 LAB — TROPONIN I
Troponin I: 0.03 ng/mL (ref <0.031)
Troponin I: <0.03 ng/mL (ref <0.031)

## 2016-01-29 LAB — MRSA PCR SCREENING: MRSA by PCR: NEGATIVE

## 2016-01-29 LAB — PROCALCITONIN: PROCALCITONIN: 0.19 ng/mL

## 2016-01-29 MED ORDER — LEVALBUTEROL HCL 0.63 MG/3ML IN NEBU
0.6300 mg | INHALATION_SOLUTION | Freq: Four times a day (QID) | RESPIRATORY_TRACT | Status: DC
  Administered 2016-01-30 – 2016-02-02 (×14): 0.63 mg via RESPIRATORY_TRACT
  Filled 2016-01-29 (×14): qty 3

## 2016-01-29 MED ORDER — SODIUM CHLORIDE 0.9 % IV SOLN
1.5000 g | Freq: Two times a day (BID) | INTRAVENOUS | Status: DC
  Administered 2016-01-29 – 2016-01-30 (×3): 1.5 g via INTRAVENOUS
  Filled 2016-01-29 (×4): qty 1.5

## 2016-01-29 MED ORDER — VANCOMYCIN HCL IN DEXTROSE 1-5 GM/200ML-% IV SOLN
1000.0000 mg | INTRAVENOUS | Status: DC
  Administered 2016-01-30: 1000 mg via INTRAVENOUS
  Filled 2016-01-29: qty 200

## 2016-01-29 MED ORDER — CETYLPYRIDINIUM CHLORIDE 0.05 % MT LIQD
7.0000 mL | Freq: Two times a day (BID) | OROMUCOSAL | Status: DC
  Administered 2016-01-29 – 2016-02-02 (×8): 7 mL via OROMUCOSAL

## 2016-01-29 MED ORDER — VANCOMYCIN HCL 10 G IV SOLR
1500.0000 mg | INTRAVENOUS | Status: AC
  Administered 2016-01-29: 1500 mg via INTRAVENOUS
  Filled 2016-01-29: qty 1500

## 2016-01-29 MED ORDER — IPRATROPIUM BROMIDE 0.02 % IN SOLN
0.5000 mg | Freq: Four times a day (QID) | RESPIRATORY_TRACT | Status: DC
  Administered 2016-01-30 – 2016-02-02 (×14): 0.5 mg via RESPIRATORY_TRACT
  Filled 2016-01-29 (×14): qty 2.5

## 2016-01-29 MED ORDER — SODIUM CHLORIDE 0.9 % IV BOLUS (SEPSIS)
1000.0000 mL | Freq: Once | INTRAVENOUS | Status: AC
  Administered 2016-01-29: 1000 mL via INTRAVENOUS

## 2016-01-29 MED ORDER — ENOXAPARIN SODIUM 30 MG/0.3ML ~~LOC~~ SOLN
30.0000 mg | SUBCUTANEOUS | Status: DC
  Administered 2016-01-29 – 2016-02-01 (×4): 30 mg via SUBCUTANEOUS
  Filled 2016-01-29 (×5): qty 0.3

## 2016-01-29 MED ORDER — METHYLPREDNISOLONE SODIUM SUCC 40 MG IJ SOLR
40.0000 mg | Freq: Two times a day (BID) | INTRAMUSCULAR | Status: DC
  Administered 2016-01-29 – 2016-01-30 (×3): 40 mg via INTRAVENOUS
  Filled 2016-01-29 (×3): qty 1

## 2016-01-29 NOTE — ED Notes (Signed)
MD at bedside. 

## 2016-01-29 NOTE — ED Notes (Signed)
Placed pt on bedpan, pt was unable to use bathroom.

## 2016-01-29 NOTE — ED Notes (Signed)
Called dietary and had the lunch tray redirected to 2H-21.

## 2016-01-29 NOTE — Progress Notes (Signed)
1600 CPT not done. RT not available as scheduled time, when RT returned pt was eating.

## 2016-01-29 NOTE — Evaluation (Signed)
Physical Therapy Evaluation Patient Details Name: [name] [name] MRN: [id number] DOB: Aug 22, 1926  Today's Date: 01/29/2016   History of Present Illness  pt is an 82 y/o male with h/o HTN, DM and chronic afib, admitted with respiratory distress and feeling sick for a few days.  Treatment for CAP.  Clinical Impression  Pt admitted with/for CAP with significant debilitation and weakness.  Pt currently limited functionally due to the problems listed. ( See problems list.)   Pt will benefit from PT to maximize function and safety in order to get ready for next venue listed below.     Follow Up Recommendations SNF;Other (comment) (Though pt/wife wish to go straight home if possible.)    Equipment Recommendations   (?3 in 1)    Recommendations for Other Services       Precautions / Restrictions        Mobility  Bed Mobility Overal bed mobility: Needs Assistance Bed Mobility: Supine to Sit;Sit to Supine     Supine to sit: Mod assist Sit to supine: Min assist   General bed mobility comments: cues for best technique, assist to come forward.  Pt scooted to EOB without assist, but guard for safety.  Transfers Overall transfer level: Needs assistance Equipment used: Rolling walker (2 wheeled) Transfers: Sit to/from [facility] Sit to Stand: Min assist;Mod assist Stand pivot transfers: Mod assist       General transfer comment: Initially pt needed more assist.  Once RW in front of pt, he gave more effort.  Ambulation/Gait Ambulation/Gait assistance: Min assist Ambulation Distance (Feet): 30 Feet Assistive device: Rolling walker (2 wheeled) Gait Pattern/deviations: Step-through pattern;Decreased step length - right;Decreased step length - left;Decreased stride length Gait velocity: slow Gait velocity interpretation: Below normal speed for age/gender General Gait Details: short shuffled steps, pt staying much too far behing the RW  Stairs             Wheelchair Mobility    Modified Rankin (Stroke Patients Only)       Balance Overall balance assessment: Needs assistance Sitting-balance support: No upper extremity supported;Bilateral upper extremity supported Sitting balance-Leahy Scale: Fair Sitting balance - Comments: doesn't accept challenge well   Standing balance support: Bilateral upper extremity supported Standing balance-Leahy Scale: Poor Standing balance comment: needs assist or AD                             Pertinent Vitals/Pain Pain Assessment: No/denies pain    Home Living Family/patient expects to be discharged to:: Private residence Living Arrangements: Spouse/significant other Available Help at Discharge: Family;Available PRN/intermittently (wife available when not running errands) Type of Home: House Home Access: Stairs to enter   [employer] of Steps: 1 Home Layout: One level Home Equipment: Walker - 2 wheels;Shower seat - built in      Prior Function Level of Independence: Needs assistance      ADL's / Homemaking Assistance Needed: assist        Hand Dominance        Extremity/Trunk Assessment   Upper Extremity Assessment: Defer to OT evaluation           Lower Extremity Assessment: Generalized weakness;Overall WFL for tasks assessed (weak mostly proximal )         Communication   Communication: No difficulties  Cognition Arousal/Alertness: Awake/alert Behavior During Therapy: WFL for tasks assessed/performed Overall Cognitive Status: Within Functional Limits for tasks assessed  General Comments General comments (skin integrity, edema, etc.): SpO2 maintained at 96%on 2L [place] at rest and 3L [place] with ambulation.    Exercises        Assessment/Plan    PT Assessment Patient needs continued PT services  PT Diagnosis Difficulty walking;Generalized weakness   PT Problem List Decreased strength;Decreased activity  tolerance;Decreased balance;Decreased mobility;Cardiopulmonary status limiting activity  PT Treatment Interventions DME instruction;Gait training;Functional mobility training;Therapeutic activities;Balance training;Patient/family education   PT Goals (Current goals can be found in the Care Plan section) Acute Rehab PT Goals Patient Stated Goal: get back home PT Goal Formulation: With patient/family Time For Goal Achievement: 02/12/16 Potential to Achieve Goals: Good    Frequency Min 3X/week   Barriers to discharge        Co-evaluation               End of Session Equipment Utilized During Treatment: Oxygen Activity Tolerance: Patient tolerated treatment well;Patient limited by fatigue Patient left: in bed;with bed alarm set;with family/visitor present Nurse Communication: Mobility status         Time: [id number] PT Time Calculation (min) (ACUTE ONLY): 43 min   Charges:   PT Evaluation $PT Eval Moderate Complexity: 1 Procedure PT Treatments $Gait Training: 8-22 mins $Therapeutic Activity: 8-22 mins   PT G Codes:        Sharyon Peitz, Tessie Fass 01/29/2016, 12:34 PM  01/29/2016  Donnella Sham, PT [phone]  (pager)

## 2016-01-29 NOTE — Progress Notes (Addendum)
PROGRESS NOTE    [name] [name] [id number] DOB: 04/24/1926 DOA: 01/28/2016 PCP: Haywood Pao, MD  Primary cardiologist: Dr. Sherren Mocha  HPI/Brief narrative 82 year old male with PMH of HTN, HLD, DM 2, chronic A. fib- Taken off Xarelto due to noncompliance and currently on aspirin, OSA-not on CPAP, stage III CKD, presented to [facility] ED on 01/28/16 with complaints of runny nose, productive cough and dyspnea. No fever, chills or sore throat. History of generalized weakness and several falls over the last few months including on day of admission. In ED, patient was found to have WBC 15.8, lactate 2.79, temperature 100.4, A. fib with RVR with heart rate 110-120, oxygen desaturation to 84%, worsening renal function, negative chest x-ray for acute abnormalities. He was admitted for sepsis due to pneumonia, acute respiratory failure with hypoxia and A. fib with RVR. CCM was consulted. Patient was admitted to stepdown.   Assessment/Plan:   1. Community acquired pneumonia versus acute purulent bronchitis: Initial and subsequent chest x-rays did not reveal focal infiltrate. Treated for sepsis protocol. Antibiotics were broadened to vancomycin and Unasyn. CCM input appreciated. 2. Sepsis: Present on admission. Secondary to problem #1. Treated for sepsis protocol with aggressive IV fluid resuscitation and broad-spectrum IV antibiotics. Sepsis physiology improved. MRSA PCR: Pending. Lactate has finally normalized. Pro-calcitonin 0.12 > 0.19. Sputum culture. Pending. Influenza panel PCR: Negative. Urine culture: Pending. Urine microscopy: Not indicative of UTI. Urinary strep and Legionella antigen: Negative. Blood cultures 2: Pending 3. Acute respiratory failure with hypoxia: Likely secondary to problem #1. Management as above. Titrate/wean oxygen as tolerated. Chest physical therapy. Incentive spirometry. Low index of suspicion for PE. Clinically not felt to be in CHF. However we will minimize fluids  >chest x-ray suggests interstitial edema. Follow 2-D echo. 4. Chronic atrial fibrillation with RVR: CHA2DS2-VASc Score is 4. Patient noncompliant and hence taken off of Xarelto, per spouse. Continue aspirin. RVR likely precipitated by sepsis physiology and has improved. Continue metoprolol XL. Cardizem drip not currently needed. 5. Essential hypertension: Mildly uncontrolled. Continue current medications and follow. 6. Acute on stage III chronic kidney disease: Baseline creatinine apparently in the 1.5-1.6 range. Creatinine on admission 1.95. May be prerenal due to dehydration from sepsis. IV fluids. Avoid nephrotoxic medications. Follow BMP in a.m. 7. Hyperlipidemia: Not on medications at home. 8. BPH: Continue Proscar and Myrbrtriq 9. Type II DM: A1c on 04/21/15:7.5. Not on medications at home. Currently on IV Solu-Medrol. Continue SSI. Mildly uncontrolled. Reduce steroids as soon as possible. 10. Frequent falls: Likely multifactorial. CT head without acute findings. SNF at discharge. 11. Dementia: Mental status supposedly at baseline.    DVT prophylaxis: Lovenox Code Status: Full Family Communication: Discussed extensively with spouse at bedside on 01/29/16 Disposition Plan: DC to SNF when medically stable. Possibly in the next 72 hours.   Consultants:  CCM  Procedures:  None  Antimicrobials:  IV azithromycin 1 dose  IV Rocephin 1 dose  IV Unasyn 2/7 >  IV vancomycin 2/7 >   Subjective: Feels better. Decreased dyspnea. States that he does not like to cough up sputum and swallows it. As per spouse, looks improved.  Objective: Filed Vitals:   01/29/16 1207 01/29/16 1209 01/29/16 1215 01/29/16 1245  BP: 143/109  141/89 147/87  Pulse: 102 104 95 94  Temp:      TempSrc:      Resp:  15 25 24   Weight:      SpO2: 91% 91% 90% 91%   temperature: 100.67F.  Intake/Output Summary (Last  24 hours) at 01/29/16 1356 Last data filed at 01/29/16 1000  Gross per 24 hour  Intake    4240 ml  Output    850 ml  Net   3390 ml   Filed Weights   01/28/16 1828  Weight: 94.348 kg (208 lb)    Exam:  General exam: Moderately built and overweight pleasant elderly male lying comfortably propped up in the gurney while seen in the ED this morning. Respiratory system: Reduced/harsh breath sounds bilaterally with occasional bilateral expiratory wheezing. No increased work of breathing. Cardiovascular system: S1 & S2 heard, irregularly irregular. No JVD, murmurs, gallops, clicks or pedal edema. Gastrointestinal system: Abdomen is nondistended, soft and nontender. Normal bowel sounds heard. Central nervous system: Alert and oriented 2. No focal neurological deficits. Extremities: Symmetric 5 x 5 power.   Data Reviewed: Basic Metabolic Panel:  Recent Labs Lab 01/28/16 1758  NA 140  K 4.3  CL 100*  CO2 25  GLUCOSE 174*  BUN 21*  CREATININE 1.95*  CALCIUM 9.6   Liver Function Tests:  Recent Labs Lab 01/28/16 1758  AST 30  ALT 26  ALKPHOS 75  BILITOT 0.9  PROT 8.0  ALBUMIN 4.0   No results for input(s): LIPASE, AMYLASE in the last 168 hours. No results for input(s): AMMONIA in the last 168 hours. CBC:  Recent Labs Lab 01/28/16 1758  WBC 15.8*  NEUTROABS 13.7*  HGB 14.3  HCT 42.9  MCV 96.4  PLT 163   Cardiac Enzymes:  Recent Labs Lab 01/28/16 2040 01/29/16 0342 01/29/16 0844  TROPONINI <0.03 0.03 <0.03   BNP (last 3 results) No results for input(s): PROBNP in the last 8760 hours. CBG:  Recent Labs Lab 01/29/16 0001 01/29/16 0628 01/29/16 0826 01/29/16 1321  GLUCAP 336* 223* 239* 202*    Recent Results (from the past 240 hour(s))  Culture, respiratory (NON-Expectorated)     Status: None (Preliminary result)   Collection Time: 01/28/16 10:19 PM  Result Value Ref Range Status   Specimen Description SPUTUM  Final   Special Requests NONE  Final   Gram Stain   Final    NO WBC SEEN FEW SQUAMOUS EPITHELIAL CELLS PRESENT NO ORGANISMS  SEEN THIS SPECIMEN IS ACCEPTABLE FOR SPUTUM CULTURE Performed at [facility]    Culture PENDING  Incomplete   Report Status PENDING  Incomplete         Studies: Dg Chest 2 View  01/29/2016  CLINICAL DATA:  Acute onset of shortness of breath, cough and congestion. Initial encounter. EXAM: CHEST  2 VIEW COMPARISON:  Chest radiograph from 01/28/2016 FINDINGS: The lungs are well-aerated. Vascular congestion is noted. Mildly increased interstitial markings could reflect minimal interstitial edema. There is no evidence of pleural effusion or pneumothorax. The heart is normal in size; the mediastinal contour is within normal limits. No acute osseous abnormalities are seen. IMPRESSION: Vascular congestion noted. Mildly increased interstitial markings could reflect minimal interstitial edema. Electronically Signed   By: Garald Balding M.D.   On: 01/29/2016 02:43   Dg Chest 2 View  01/28/2016  CLINICAL DATA:  Productive cough for 2 days. Shortness of breath and weakness today. Initial encounter. EXAM: CHEST  2 VIEW COMPARISON:  PA and lateral chest 04/21/2015 and 12/28/2012. FINDINGS: The lungs are clear. Heart size is normal. There is no pneumothorax or pleural effusion. No focal bony abnormality is identified. IMPRESSION: No acute disease. Electronically Signed   By: Inge Rise M.D.   On: 01/28/2016 18:09   Ct  Head Wo Contrast  01/28/2016  CLINICAL DATA:  Acute onset of cough. Worsening generalized weakness and recent falls. Initial encounter. EXAM: CT HEAD WITHOUT CONTRAST TECHNIQUE: Contiguous axial images were obtained from the base of the skull through the vertex without intravenous contrast. COMPARISON:  CT of the head performed 05/25/2013 FINDINGS: There is no evidence of acute infarction, mass lesion, or intra- or extra-axial hemorrhage on CT. Prominence of the ventricles and sulci reflects moderate cortical volume loss. Mild cerebellar atrophy is noted. Diffuse periventricular and  subcortical white matter change likely reflects small vessel ischemic microangiopathy. Chronic ischemic change is noted at the basal ganglia bilaterally. The brainstem and fourth ventricle are within normal limits. The cerebral hemispheres demonstrate grossly normal gray-white differentiation. No mass effect or midline shift is seen. There is no evidence of fracture; visualized osseous structures are unremarkable in appearance. The orbits are within normal limits. The paranasal sinuses and mastoid air cells are well-aerated. No significant soft tissue abnormalities are seen. IMPRESSION: 1. No acute intracranial pathology seen on CT. 2. Moderate cortical volume loss and diffuse small vessel ischemic microangiopathy. 3. Chronic ischemic change at the basal ganglia bilaterally. Electronically Signed   By: Garald Balding M.D.   On: 01/28/2016 23:24        Scheduled Meds: . ampicillin-sulbactam (UNASYN) IV  1.5 g Intravenous Q12H  . aspirin EC  325 mg Oral Daily  . dextromethorphan-guaiFENesin  1 tablet Oral BID  . donepezil  5 mg Oral Daily  . enoxaparin (LOVENOX) injection  30 mg Subcutaneous Q24H  . finasteride  5 mg Oral Daily  . insulin aspart  0-5 Units Subcutaneous QHS  . insulin aspart  0-9 Units Subcutaneous TID WC  . ipratropium  0.5 mg Nebulization Q6H  . levalbuterol  0.63 mg Nebulization Q6H  . methylPREDNISolone (SOLU-MEDROL) injection  60 mg Intravenous Q12H  . metoprolol succinate  50 mg Oral QHS  . mirabegron ER  50 mg Oral Daily  . [START ON 01/30/2016] vancomycin  1,000 mg Intravenous Q24H   Continuous Infusions: . sodium chloride Stopped (01/29/16 0604)    Principal Problem:   Acute respiratory failure with hypoxia (HCC) Active Problems:   Hyperlipidemia   HYPERTENSION, BENIGN   Atrial fibrillation with RVR ([place])   Fall   Acute renal failure superimposed on stage 3 chronic kidney disease (HCC)   BPH (benign prostatic hypertrophy)   Diabetes mellitus without  complication (HCC)   Elevated lactic acid level   Sepsis ([place])    Time spent: 40 minutes.    Vernell Leep, MD, FACP, FHM. [facility] Pager [phone]  If 7PM-7AM, please contact night-coverage [email or web address] Password [email or web address] 01/29/2016, 1:56 PM    LOS: 1 day

## 2016-01-29 NOTE — ED Notes (Signed)
Pt had small BM.

## 2016-01-29 NOTE — ED Notes (Signed)
PT at bedside.

## 2016-01-29 NOTE — Consult Note (Signed)
Pharmacy Antibiotic Note  [name] [name] is a 82 y.o. male admitted on 01/28/2016 with pneumonia.  Pharmacy initially consulted for Rocephin and Azithromycin. Antibiotics now broadened to Vancomycin and Unasyn.  Plan: Unasyn 1.5gm IV q12h Vancomycin 1500mg  IV now then 1gm IV q24h Will f/u micro data, renal function, and pt's clinical condition Vanc trough prn   Weight: 208 lb (94.348 kg)  Temp (24hrs), Avg:99.6 F (37.6 C), Min:98.7 F (37.1 C), Max:100.4 F (38 C)   Recent Labs Lab 01/28/16 1756 01/28/16 1758 01/28/16 2049 01/28/16 2140  WBC  --  15.8*  --   --   CREATININE  --  1.95*  --   --   LATICACIDVEN 2.79*  --  3.5* 4.5*    Estimated Creatinine Clearance: 28.7 mL/min (by C-G formula based on Cr of 1.95).    Allergies  Allergen Reactions  . Sulfa Antibiotics Swelling    hands    Antimicrobials this admission: Ceftriaxone 2/7 >> 2/8 Azithromycin 2/7 >> 2/8 Unasyn 2/8 >> Vancomycin 2/8 >>  Dose adjustments this admission: n/a  Microbiology results: 2/7 BCx:  2/7 UCx:    Thank you for allowing pharmacy to be a part of this patient's care.  Sherlon Handing, PharmD, BCPS Clinical pharmacist, pager [phone] 01/29/2016 2:19 AM

## 2016-01-29 NOTE — ED Notes (Signed)
Patient transported to X-ray 

## 2016-01-29 NOTE — Evaluation (Signed)
Clinical/Bedside Swallow Evaluation Patient Details  Name: JULIA DEC MRN: [id number] Date of Birth: Dec 08, 1926  Today's Date: 01/29/2016 Time: SLP Start Time (ACUTE ONLY): [id number] SLP Stop Time (ACUTE ONLY): 1452 SLP Time Calculation (min) (ACUTE ONLY): 23 min  Past Medical History:  Past Medical History  Diagnosis Date  . Hypertension   . Hyperlipidemia   . Chronic atrial fibrillation ([place])   . Urinary retention foley last changed 3 weeks ago  . Arthritis   . Sleep apnea     no cpap used, sleep study was several yrs ago  . Diabetes mellitus without complication [facility])    Past Surgical History:  Past Surgical History  Procedure Laterality Date  . Back surgery  yrs ago    lower back surgery  . Tonsillectomy  as child  . Prostate surgery  yrs ago, no cancer  . Transurethral resection of prostate  02/22/2012    Procedure: TRANSURETHRAL RESECTION OF THE PROSTATE WITH GYRUS INSTRUMENTS;  Surgeon: Molli Hazard, MD;  Location: [facility];  Service: Urology;  Laterality: N/A;       HPI:  82 y.o. male with PMH of hypertension, hyperlipidemia, diabetes mellitus, atrial fibrillation not taking blood thinner, OSA, arthritis, chronic kidney disease-stage III, who presents with shortness of breath and cough. CXR Vascular congestion noted. Mildly increased interstitial markings could reflect minimal interstitial edema. Head CT No acute intracranial pathology seen.   Assessment / Plan / Recommendation Clinical Impression  No evidence of dysphagia and no s/s of penetration/aspiration observed. Swallow timely and appeared efficient. Per pt report, no c/o dysphagia or odynophagia. Pt educated re: diet recommendation and aspiration precautions. Recommend regular diet, thin liquids and meds whole with liquid. No SLP f/u recommended at this time.    Aspiration Risk  Mild aspiration risk    Diet Recommendation Regular;Thin liquid   Liquid Administration via: Cup;Straw Medication  Administration: Whole meds with liquid Supervision: Patient able to self feed;Staff to assist with self feeding Compensations: Minimize environmental distractions;Slow rate;Small sips/bites Postural Changes: Seated upright at 90 degrees    Other  Recommendations Oral Care Recommendations: Oral care BID   Follow up Recommendations  None     Prognosis Prognosis for Safe Diet Advancement: Good      Swallow Study   General HPI: 82 y.o. male with PMH of hypertension, hyperlipidemia, diabetes mellitus, atrial fibrillation not taking blood thinner, OSA, arthritis, chronic kidney disease-stage III, who presents with shortness of breath and cough. CXR Vascular congestion noted. Mildly increased interstitial markings could reflect minimal interstitial edema. Head CT No acute intracranial pathology seen. Type of Study: Bedside Swallow Evaluation Previous Swallow Assessment: none found Diet Prior to this Study: Regular;Thin liquids Temperature Spikes Noted: Yes Respiratory Status: Nasal cannula History of Recent Intubation: No Behavior/Cognition: Alert;Cooperative;Pleasant mood Oral Cavity Assessment: Within Functional Limits Oral Care Completed by SLP: No Oral Cavity - Dentition: Adequate natural dentition Vision: Functional for self-feeding Self-Feeding Abilities: Able to feed self;Needs assist;Needs set up Patient Positioning: Upright in bed Baseline Vocal Quality: Hoarse;Other (comment) (gravelly) Volitional Cough: Strong Volitional Swallow: Able to elicit    Oral/Motor/Sensory Function Overall Oral Motor/Sensory Function: Within functional limits   Ice Chips Ice chips: Not tested   Thin Liquid Thin Liquid: Within functional limits Presentation: Cup;Self Fed;Straw    Nectar Thick Nectar Thick Liquid: Not tested   Honey Thick Honey Thick Liquid: Not tested   Puree Puree: Within functional limits Presentation: Self Fed;Spoon   Solid   GO   Solid: Within functional  limits Presentation: Self Therese Sarah Vivien Barretto 01/29/2016,3:09 PM    Titus Mould, Student-SLP

## 2016-01-29 NOTE — ED Notes (Signed)
Ordered pt a heart healthy/carb modified lunch tray, per [employer] RN

## 2016-01-29 NOTE — ED Notes (Signed)
CBG:239 

## 2016-01-29 NOTE — ED Notes (Signed)
Heart Healthy Tray ordered @0614 .

## 2016-01-29 NOTE — ED Notes (Signed)
Bladder scan 243 ML

## 2016-01-29 NOTE — Consult Note (Signed)
PULMONARY CONSULT NOTE  Requesting MD/Service: Blaine Hamper MD Date of initial consultation: 01/29/2016 Reason for consultation: Respiratory Distress  PT PROFILE: 82 y/o M with chronic atrial fibrillation, OSA, DMII, HTN, HLD, CKD 3, BPH.    HPI: Pt. States that he has been feeling sick for the past few days, has been "coughing up stuff", green phlegm. He denies fevers, chills, nausea, chest pain. Denies shortness of breath, abdominal pain, jaw, neck, or arm pain. He initially cannot remember why he came to the ED, and then says he was "just feeling sick". No lower extremity pain, swelling, or erythema.   In the ED, pt. Found to initially be very mildly hypoxic to 89% on room air, but subsequently was able to maintain O2 saturations on his own. He did have an elevated WBC at 15.8, Temp of 100.4, a coarse / raspy sounding cough with a normal CXR. Significantly, his Lactic Acid was noted to be 2.79 > 3.5>4.5. Blood cultures were drawn, he was given IV fluids, and he was started empirically on Rocephin and Azithromycin. A Urinalysis was performed and was negative, Flu negative, Troponin negative, Procalcitonin 0.12. ABG was notable for poorly compensated metabolic acidosis. PH 7.29.    Past Medical History  Diagnosis Date  . Hypertension   . Hyperlipidemia   . Chronic atrial fibrillation ([place])   . Urinary retention foley last changed 3 weeks ago  . Arthritis   . Sleep apnea     no cpap used, sleep study was several yrs ago  . Diabetes mellitus without complication [facility])     Past Surgical History  Procedure Laterality Date  . Back surgery  yrs ago    lower back surgery  . Tonsillectomy  as child  . Prostate surgery  yrs ago, no cancer  . Transurethral resection of prostate  02/22/2012    Procedure: TRANSURETHRAL RESECTION OF THE PROSTATE WITH GYRUS INSTRUMENTS;  Surgeon: Molli Hazard, MD;  Location: [facility];  Service: Urology;  Laterality: N/A;        MEDICATIONS: I have reviewed all  medications and confirmed regimen as documented  Social History   Social History  . Marital Status: Married    Spouse Name: N/A  . Number of Children: N/A  . Years of Education: N/A   Occupational History  . Not on file.   Social History Main Topics  . Smoking status: Former Smoker -- 0.50 packs/day for 50 years    Types: Cigarettes  . Smokeless tobacco: Never Used  . Alcohol Use: Yes     Comment: rare use  . Drug Use: No  . Sexual Activity: No   Other Topics Concern  . Not on file   Social History Narrative    Family History  Problem Relation Age of Onset  . Heart attack Father     ROS: No fever, myalgias/arthralgias, unexplained weight loss or weight gain No new focal weakness or sensory deficits No otalgia, hearing loss, visual changes, nasal and sinus symptoms, mouth and throat problems No neck pain or adenopathy No abdominal pain, N/V/D, diarrhea, change in bowel pattern No dysuria, change in urinary pattern No LE edema or calf tenderness   Filed Vitals:   01/28/16 2315 01/28/16 2330 01/29/16 0145 01/29/16 0200  BP: 121/72 121/69 107/77 125/82  Pulse: 112 102 97 105  Temp:      TempSrc:      Resp: 21 24 23 20   Weight:      SpO2: 92% 92% 92% 91%  EXAM:  Gen: WDWN, No overt respiratory distress HEENT: NCAT, MMM, Raspy cough, no visible sputum but sounds like he has some caught in his throat, nontender. Neck: Supple without LAN, thyromegaly Lungs: breath sounds Coarse bilaterally, appropriate rate, unlabored, 2L [place] for comfort.  Cardiovascular: Tachycardic, irregularly irregular rhythm no murmurs noted Abdomen: Soft, nontender, normal BS, palpable organomegaly.  Ext: without clubbing, cyanosis, LE venous insufficiency, no erythema, no edema.  Neuro: CNs grossly intact, motor and sensory intact, DTRs symmetric Skin: Limited exam, no lesions noted  DATA:   BMP Latest Ref Rng 01/28/2016 04/22/2015 04/21/2015  Glucose 65 - 99 mg/dL 174(H) 144(H) 175(H)   BUN 6 - 20 mg/dL 21(H) 20 16  Creatinine 0.61 - 1.24 mg/dL 1.95(H) 1.60(H) 1.66(H)  Sodium 135 - 145 mmol/L 140 137 134(L)  Potassium 3.5 - 5.1 mmol/L 4.3 3.6 3.8  Chloride 101 - 111 mmol/L 100(L) 103 99(L)  CO2 22 - 32 mmol/L 25 24 22   Calcium 8.9 - 10.3 mg/dL 9.6 8.9 9.1    CBC Latest Ref Rng 01/28/2016 04/22/2015 04/21/2015  WBC 4.0 - 10.5 K/uL 15.8(H) 10.3 11.7(H)  Hemoglobin 13.0 - 17.0 g/dL 14.3 14.1 14.7  Hematocrit 39.0 - 52.0 % 42.9 41.2 42.2  Platelets 150 - 400 K/uL 163 162 180    CXR:   2/8 CXR: No acute disease, clear lungs.   IMPRESSION:     ICD-9-CM ICD-10-CM   1. CAP (community acquired pneumonia) 46 J18.9   2. Hypoxia 799.02 R09.02   3. Productive cough 786.2 R05   4. Elevated lactic acid level 276.2 E87.2   5. Fall E888.9 W19.XXXA CT Head Wo Contrast     CT Head Wo Contrast  6. SOB (shortness of breath) 786.05 R06.02 DG Chest 2 View     DG Chest 2 View   Clinically CAP Sepsis Lactic Acidosis poorly compensated Afib RVR  PLAN:  - CXR repeat to see if pneumonia has evolved - Continue to give Fluids. Repeat 1L bolus - Follow lactic acid - Follow Blood Cultures, Urine Cx - Broaden to Vanc / Unasyn - Echocardiogram  - TSH - Mucinex / expectorant - Chest physiotherapy.   STAFF NOTE: I, Merrie Roof, MD FACP have personally reviewed patient's available data, including medical history, events of note, physical examination and test results as part of my evaluation. I have discussed with resident/NP and other care providers such as pharmacist, RN and RRT. In addition, I personally evaluated patient and elicited key findings of: no distress, has a clear cut ronchi BS left greater rt and uppper airway ronchi, no strdiro, he has difficulty to clear secreiotns, high sucpsion for aspiration / PNA as source, mild fevers noted, lactic noted, would push more volume, repeat pcxr to ee if blossom infiltrate, would r/o underlying liver or cardiac contribution to lactic  noted, likely with more volume will reduce, some of intermittent wob may contributing to lactic noted, admit sdu, slp, no role BIPAP and would harm with upper airway issues, NTS now, mucomysts, he received 30 cc/kg although BP never low and lac initial less 4, re assement comp,etd by me, no role pressors as of now, will follow, needs strict NPO, unlikely PE as physical examination impressive for upper airway etc The patient is critically ill with multiple organ systems failure and requires high complexity decision making for assessment and support, frequent evaluation and titration of therapies, application of advanced monitoring technologies and extensive interpretation of multiple databases.   Critical Care Time devoted to patient care  services described in this note is 30  Minutes. This time reflects time of care of this signee: Merrie Roof, MD FACP. This critical care time does not reflect procedure time, or teaching time or supervisory time of PA/NP/Med student/Med Resident etc but could involve care discussion time. Rest per NP/medical resident whose note is outlined above and that I agree with   Lavon Paganini. Titus Mould, MD, [place] Pgr: [facility] 01/29/2016 7:43 AM

## 2016-01-29 NOTE — ED Notes (Signed)
Respiratory Therapy at bedside

## 2016-01-30 ENCOUNTER — Other Ambulatory Visit (HOSPITAL_COMMUNITY): Payer: Medicare Other

## 2016-01-30 LAB — CBC
HEMATOCRIT: 42.9 % (ref 39.0–52.0)
HEMOGLOBIN: 14.8 g/dL (ref 13.0–17.0)
MCH: 33.1 pg (ref 26.0–34.0)
MCHC: 34.5 g/dL (ref 30.0–36.0)
MCV: 96 fL (ref 78.0–100.0)
Platelets: 152 10*3/uL (ref 150–400)
RBC: 4.47 MIL/uL (ref 4.22–5.81)
RDW: 13.9 % (ref 11.5–15.5)
WBC: 32 10*3/uL — ABNORMAL HIGH (ref 4.0–10.5)

## 2016-01-30 LAB — URINE CULTURE: CULTURE: NO GROWTH

## 2016-01-30 LAB — GLUCOSE, CAPILLARY
GLUCOSE-CAPILLARY: 167 mg/dL — AB (ref 65–99)
Glucose-Capillary: 162 mg/dL — ABNORMAL HIGH (ref 65–99)
Glucose-Capillary: 225 mg/dL — ABNORMAL HIGH (ref 65–99)
Glucose-Capillary: 296 mg/dL — ABNORMAL HIGH (ref 65–99)

## 2016-01-30 LAB — BASIC METABOLIC PANEL
ANION GAP: 10 (ref 5–15)
BUN: 27 mg/dL — AB (ref 6–20)
CALCIUM: 8.7 mg/dL — AB (ref 8.9–10.3)
CO2: 20 mmol/L — AB (ref 22–32)
Chloride: 107 mmol/L (ref 101–111)
Creatinine, Ser: 1.45 mg/dL — ABNORMAL HIGH (ref 0.61–1.24)
GFR calc non Af Amer: 41 mL/min — ABNORMAL LOW (ref >60)
GFR, EST AFRICAN AMERICAN: 48 mL/min — AB (ref >60)
GLUCOSE: 217 mg/dL — AB (ref 65–99)
POTASSIUM: 5.1 mmol/L (ref 3.5–5.1)
Sodium: 137 mmol/L (ref 135–145)

## 2016-01-30 MED ORDER — GUAIFENESIN ER 600 MG PO TB12
1200.0000 mg | ORAL_TABLET | Freq: Two times a day (BID) | ORAL | Status: DC
  Administered 2016-01-30 – 2016-02-02 (×7): 1200 mg via ORAL
  Filled 2016-01-30 (×7): qty 2

## 2016-01-30 MED ORDER — SODIUM CHLORIDE 0.9 % IV SOLN
1.5000 g | Freq: Four times a day (QID) | INTRAVENOUS | Status: DC
  Administered 2016-01-30 – 2016-01-31 (×5): 1.5 g via INTRAVENOUS
  Filled 2016-01-30 (×7): qty 1.5

## 2016-01-30 MED ORDER — VANCOMYCIN HCL 10 G IV SOLR
1250.0000 mg | INTRAVENOUS | Status: DC
  Filled 2016-01-30: qty 1250

## 2016-01-30 MED ORDER — ACETAMINOPHEN 325 MG PO TABS: 650.0000 mg | ORAL_TABLET | Freq: Four times a day (QID) | ORAL | Status: DC | PRN

## 2016-01-30 NOTE — Progress Notes (Signed)
Patient did not receive 00:00 CPT treatment due to patient sleeping. RT will continue therapy at next scheduled time.

## 2016-01-30 NOTE — Evaluation (Signed)
Occupational Therapy Evaluation Patient Details Name: [name] [name] MRN: [id number] DOB: [birth year] Today's Date: 01/30/2016    History of Present Illness pt is an 82 y/o male with h/o HTN, DM and chronic afib, admitted with respiratory distress and feeling sick for a few days.  Treatment for CAP.   Clinical Impression   Pt admitted with the above diagnosis and has the deficits listed below. Pt would benefit from cont OT to increase independence with basic adls to a min assist to S level so he can d/c home with wife and decrease burden of care on her.  Pt currently with tachycardia with activity and BP was 154/120 with minimal activity.  Pt would be unsafe d/cing home due to requiring so much assist right now.  Feel SNF is the safer option until he is safer on his feet and not at risk for a fall.    Follow Up Recommendations  SNF;Supervision/Assistance - 24 hour    Equipment Recommendations  3 in 1 bedside comode    Recommendations for Other Services       Precautions / Restrictions Precautions Precautions: Fall Restrictions Weight Bearing Restrictions: No      Mobility Bed Mobility Overal bed mobility: Needs Assistance Bed Mobility: Supine to Sit;Sit to Supine     Supine to sit: Min assist Sit to supine: Min assist   General bed mobility comments: cues for best technique, assist to come forward.  Pt scooted to EOB without assist, but guard for safety.  Transfers Overall transfer level: Needs assistance Equipment used: Rolling walker (2 wheeled) Transfers: Sit to/from [facility] Sit to Stand: Mod assist Stand pivot transfers: Mod assist       General transfer comment: Pt was unstead on his feet with posterior lean.  Without outside assist pt would fall.    Balance Overall balance assessment: Needs assistance Sitting-balance support: Feet supported;Bilateral upper extremity supported Sitting balance-Leahy Scale: Fair Sitting balance -  Comments: doesn't accept challenge well   Standing balance support: Bilateral upper extremity supported;During functional activity Standing balance-Leahy Scale: Poor Standing balance comment: Pt had to have significant outside assist or walker with additional assist.                            ADL Overall ADL's : Needs assistance/impaired Eating/Feeding: Set up;Sitting   Grooming: Wash/dry hands;Wash/dry face;Oral care;Set up;Sitting   Upper Body Bathing: Set up;Sitting   Lower Body Bathing: Maximal assistance;Sit to/from stand;Cueing for safety Lower Body Bathing Details (indicate cue type and reason): pt is very unsteady on his feet and has posterior lean Upper Body Dressing : Minimal assistance;Sitting   Lower Body Dressing: Maximal assistance;Sit to/from stand;Cueing for safety Lower Body Dressing Details (indicate cue type and reason): Pt unable to donn or doff socks without assist and has difficulty starting pants over his legs.  Pt unsteady on his feet when standing. Toilet Transfer: Moderate assistance;BSC;Stand-pivot;Cueing for safety Toilet Transfer Details (indicate cue type and reason): posterior lean.  pt w little insight to his imbalance. Toileting- Clothing Manipulation and Hygiene: Total assistance;Sit to/from stand;Cueing for safety Toileting - Clothing Manipulation Details (indicate cue type and reason): Pt held to walker and therapist while second person cleaned pt.     Functional mobility during ADLs: Moderate assistance;Rolling walker General ADL Comments: Pt needs a great amount of assist with LE adls due to poor standing balance and inability to reach toward feet. Pt's HR and BP would go  up when attempting to do LE adls.       Vision Vision Assessment?: No apparent visual deficits   Perception Perception Perception Tested?: No   Praxis Praxis Praxis tested?: Within functional limits    Pertinent Vitals/Pain Pain Assessment: No/denies pain      Hand Dominance Right   Extremity/Trunk Assessment Upper Extremity Assessment Upper Extremity Assessment: Generalized weakness   Lower Extremity Assessment Lower Extremity Assessment: Defer to PT evaluation   Cervical / Trunk Assessment Cervical / Trunk Assessment: Normal   Communication Communication Communication: HOH   Cognition Arousal/Alertness: Awake/alert Behavior During Therapy: WFL for tasks assessed/performed Overall Cognitive Status: History of cognitive impairments - at baseline       Memory: Decreased short-term memory             General Comments       Exercises       Shoulder Instructions      Home Living Family/patient expects to be discharged to:: Private residence Living Arrangements: Spouse/significant other Available Help at Discharge: Family;Available PRN/intermittently Type of Home: House Home Access: Stairs to enter [employer] of Steps: 1   Home Layout: One level     Bathroom Shower/Tub: Occupational psychologist: Standard     Home Equipment: Environmental consultant - 2 wheels;Shower seat - built in          Prior Functioning/Environment Level of Independence: Needs assistance  Gait / Transfers Assistance Needed: "wall walks" with wife nearby; just to and from bathroom typically ADL's / Homemaking Assistance Needed: wife cooks cleans.        OT Diagnosis: Generalized weakness   OT Problem List: Decreased activity tolerance;Impaired balance (sitting and/or standing);Decreased cognition;Decreased safety awareness;Decreased knowledge of use of DME or AE;Decreased knowledge of precautions;Cardiopulmonary status limiting activity   OT Treatment/Interventions: Self-care/ADL training;Therapeutic activities;DME and/or AE instruction    OT Goals(Current goals can be found in the care plan section) Acute Rehab OT Goals Patient Stated Goal: get back home OT Goal Formulation: With patient Time For Goal Achievement:  02/13/16 Potential to Achieve Goals: Good ADL Goals Pt Will Perform Lower Body Bathing: with supervision;sit to/from stand;with adaptive equipment Pt Will Perform Lower Body Dressing: with supervision;with adaptive equipment;sit to/from stand Pt Will Transfer to Toilet: with supervision;ambulating;bedside commode;regular height toilet Pt Will Perform Toileting - Clothing Manipulation and hygiene: with supervision;sitting/lateral leans Pt Will Perform Tub/Shower Transfer: Shower transfer;ambulating;shower seat;rolling walker;with min guard assist  OT Frequency: Min 2X/week   Barriers to D/C: Decreased caregiver support  wife only one available to assist.       Co-evaluation              End of Session Equipment Utilized During Treatment: Rolling walker;Oxygen Nurse Communication: Mobility status;Other (comment) (BP 154/120)  Activity Tolerance: Patient limited by fatigue Patient left: in chair;with call bell/phone within reach;with chair alarm set   Time: [id number] OT Time Calculation (min): 26 min Charges:  OT General Charges $OT Visit: 1 Procedure OT Evaluation $OT Eval Moderate Complexity: 1 Procedure OT Treatments $Self Care/Home Management : 8-22 mins G-Codes:    Glenford Peers 02-18-16, 10:42 AM  [phone]

## 2016-01-30 NOTE — Consult Note (Signed)
Pharmacy Antibiotic Note  [name] [name] is a 82 y.o. male admitted on 01/28/2016 with pneumonia. He is on day 2 vancomycin/unasyn for PNA. WBC= 32 (up; noted on steroids), afeb, SCr= 1.45 (down; CrCl ~ 40), PCT= 0.19  Plan: - Change unasyn to 1.5gm IV Q6H - Change vanc to1250mg  IV Q24h -Consider streamline antibiotics soon based on cultures -Will follow renal function, cultures and clinical progress   Weight: 231 lb 0.7 oz (104.8 kg)  Temp (24hrs), Avg:97.7 F (36.5 C), Min:96.2 F (35.7 C), Max:98.5 F (36.9 C)   Recent Labs Lab 01/28/16 1756 01/28/16 1758 01/28/16 2049 01/28/16 2140 01/29/16 0342 01/29/16 0702 01/30/16 0528  WBC  --  15.8*  --   --   --   --  32.0*  CREATININE  --  1.95*  --   --   --   --  1.45*  LATICACIDVEN 2.79*  --  3.5* 4.5* 3.0* 1.9  --     Estimated Creatinine Clearance: 40.6 mL/min (by C-G formula based on Cr of 1.45).    Allergies  Allergen Reactions  . Sulfa Antibiotics Swelling    hands    Antimicrobials this admission: Ceftriaxone 2/7 >> 2/8 Azithromycin 2/7 >> 2/8 Unasyn 2/8 >> Vancomycin 2/8 >>  Dose adjustments this admission: n/a  Microbiology results: 2/7 Sputum - pending 2/9 MRSA PCR- neg 2/7 urine 2/7 blood x2- ngtd   Thank you for allowing pharmacy to be a part of this patient's care.  Hildred Laser, Pharm D 01/30/2016 8:43 AM

## 2016-01-30 NOTE — Progress Notes (Signed)
PROGRESS NOTE    [name] [name] [id number] DOB: [birth year] DOA: 01/28/2016 PCP: Haywood Pao, MD  Primary cardiologist: Dr. Sherren Mocha  HPI/Brief narrative 82 year old male with PMH of HTN, HLD, DM 2, chronic A. fib- Taken off Xarelto due to noncompliance and currently on aspirin, OSA-not on CPAP, stage III CKD, presented to [facility] ED on 01/28/16 with complaints of runny nose, productive cough and dyspnea. No fever, chills or sore throat. History of generalized weakness and several falls over the last few months including on day of admission. In ED, patient was found to have WBC 15.8, lactate 2.79, temperature 100.4, A. fib with RVR with heart rate 110-120, oxygen desaturation to 84%, worsening renal function, negative chest x-ray for acute abnormalities. He was admitted for sepsis due to pneumonia, acute respiratory failure with hypoxia and A. fib with RVR. CCM was consulted. Patient was admitted to stepdown.   Assessment/Plan:   1. Community acquired pneumonia versus acute purulent bronchitis: Initial and subsequent chest x-rays did not reveal focal infiltrate. Treated per sepsis protocol. Antibiotics were broadened to vancomycin and Unasyn. CCM input appreciated. DC vancomycin. Consider transitioning to oral antibiotics in a.m. 2. Sepsis: Present on admission. Secondary to problem #1. Treated per sepsis protocol with aggressive IV fluid resuscitation and broad-spectrum IV antibiotics. Sepsis physiology improved. MRSA PCR: Negative. Lactate has finally normalized. Pro-calcitonin 0.12 > 0.19. Sputum culture. Pending. Influenza panel PCR: Negative. Urine culture: Neg. Urine microscopy: Not indicative of UTI. Urinary strep and Legionella antigen: Negative. Blood cultures 2: Negative to date. 3. Acute respiratory failure with hypoxia: Likely secondary to problem #1. Management as above. Titrate/wean oxygen as tolerated. Chest physical therapy. Incentive spirometry. Low index of suspicion for  PE. Clinically not felt to be in CHF. However minimized fluids >chest x-ray suggests interstitial edema. 2-D echo results as below. 4. Chronic atrial fibrillation with RVR: CHA2DS2-VASc Score is 4. Patient noncompliant and hence taken off of Xarelto, per spouse. Continue aspirin. RVR on admission likely precipitated by sepsis physiology and has improved. Continue metoprolol XL. Was not started on Cardizem drip. Controlled ventricular rate. 5. Essential hypertension: Mildly uncontrolled. Continue current medications and follow. 6. Acute on stage III chronic kidney disease: Baseline creatinine apparently in the 1.5-1.6 range. Creatinine on admission 1.95. May be prerenal due to dehydration from sepsis. IV fluids. Avoid nephrotoxic medications. Improved.  7. Hyperlipidemia: Not on medications at home. 8. BPH: Continue Proscar and Myrbrtriq 9. Type II DM: A1c on 04/21/15:7.5. Not on medications at home. Currently on IV Solu-Medrol. Continue SSI. Mildly uncontrolled. Reduce steroids as soon as possible. 10. Frequent falls: Likely multifactorial. CT head without acute findings. SNF at discharge. 11. Dementia: Mental status supposedly at baseline. 12. Leukocytosis: Likely steroid effect. Follow CBCs periodically.    DVT prophylaxis: Lovenox Code Status: Full Family Communication: Discussed extensively with spouse at bedside on 01/29/16. None at bedside today. Disposition Plan: DC to SNF when medically stable. Possibly in the next 48 hours.   Consultants:  CCM  Procedures:  None  Antimicrobials:  IV azithromycin 1 dose  IV Rocephin 1 dose  IV Unasyn 2/7 >  IV vancomycin 2/7 > 2/9   Subjective: Dyspnea improved. States that his cough is breaking up with chest physical therapy. Per RN, no acute events.  Objective: Filed Vitals:   01/30/16 0846 01/30/16 1000 01/30/16 1118 01/30/16 1143  BP: 158/114 142/78 133/93   Pulse: 106 100 116   Temp: 97.5 F (36.4 C)  97.8 F (36.6 C)  TempSrc: Axillary  Oral   Resp: 24 19 20    Weight:      SpO2: 95% 92% 92% 91%    Intake/Output Summary (Last 24 hours) at 01/30/16 1444 Last data filed at 01/30/16 1028  Gross per 24 hour  Intake    840 ml  Output   1275 ml  Net   -435 ml   Filed Weights   01/28/16 1828 01/29/16 1320 01/30/16 0400  Weight: 94.348 kg (208 lb) 105.2 kg (231 lb 14.8 oz) 104.8 kg (231 lb 0.7 oz)    Exam:  General exam: Moderately built and overweight pleasant elderly male lying comfortably propped up in bed. Respiratory system: Improved breath sounds but still harsh. No rhonchi, wheezing or crackles. No increased work of breathing. Cardiovascular system: S1 & S2 heard, irregularly irregular. No JVD, murmurs, gallops, clicks or pedal edema. Telemetry: A. fib with ventricular rate in the 80s-90s. Single episode of slow ventricular response of 2.07 seconds. Gastrointestinal system: Abdomen is nondistended, soft and nontender. Normal bowel sounds heard. Central nervous system: Alert and oriented 2. No focal neurological deficits. Extremities: Symmetric 5 x 5 power.   Data Reviewed: Basic Metabolic Panel:  Recent Labs Lab 01/28/16 1758 01/30/16 0528  NA 140 137  K 4.3 5.1  CL 100* 107  CO2 25 20*  GLUCOSE 174* 217*  BUN 21* 27*  CREATININE 1.95* 1.45*  CALCIUM 9.6 8.7*   Liver Function Tests:  Recent Labs Lab 01/28/16 1758  AST 30  ALT 26  ALKPHOS 75  BILITOT 0.9  PROT 8.0  ALBUMIN 4.0   No results for input(s): LIPASE, AMYLASE in the last 168 hours. No results for input(s): AMMONIA in the last 168 hours. CBC:  Recent Labs Lab 01/28/16 1758 01/30/16 0528  WBC 15.8* 32.0*  NEUTROABS 13.7*  --   HGB 14.3 14.8  HCT 42.9 42.9  MCV 96.4 96.0  PLT 163 152   Cardiac Enzymes:  Recent Labs Lab 01/28/16 2040 01/29/16 0342 01/29/16 0844  TROPONINI <0.03 0.03 <0.03   BNP (last 3 results) No results for input(s): PROBNP in the last 8760 hours. CBG:  Recent Labs Lab  01/29/16 1321 01/29/16 1702 01/29/16 2118 01/30/16 0833 01/30/16 1122  GLUCAP 202* 220* 223* 162* 225*    Recent Results (from the past 240 hour(s))  Blood culture (routine x 2)     Status: None (Preliminary result)   Collection Time: 01/28/16  4:20 PM  Result Value Ref Range Status   Specimen Description BLOOD LEFT HAND  Final   Special Requests IN PEDIATRIC BOTTLE 4CC  Final   Culture NO GROWTH < 24 HOURS  Final   Report Status PENDING  Incomplete  Blood culture (routine x 2)     Status: None (Preliminary result)   Collection Time: 01/28/16  6:15 PM  Result Value Ref Range Status   Specimen Description BLOOD RIGHT ANTECUBITAL  Final   Special Requests BOTTLES DRAWN AEROBIC AND ANAEROBIC 5CC  Final   Culture NO GROWTH < 24 HOURS  Final   Report Status PENDING  Incomplete  Urine culture     Status: None   Collection Time: 01/28/16  9:50 PM  Result Value Ref Range Status   Specimen Description URINE, CATHETERIZED  Final   Special Requests NONE  Final   Culture NO GROWTH 1 DAY  Final   Report Status 01/30/2016 FINAL  Final  Culture, respiratory (NON-Expectorated)     Status: None (Preliminary result)   Collection Time: 01/28/16 10:19  PM  Result Value Ref Range Status   Specimen Description SPUTUM  Final   Special Requests NONE  Final   Gram Stain   Final    NO WBC SEEN FEW SQUAMOUS EPITHELIAL CELLS PRESENT NO ORGANISMS SEEN THIS SPECIMEN IS ACCEPTABLE FOR SPUTUM CULTURE Performed at [facility]    Culture   Final    Culture reincubated for better growth Performed at [facility]    Report Status PENDING  Incomplete  MRSA PCR Screening     Status: None   Collection Time: 01/29/16  1:24 PM  Result Value Ref Range Status   MRSA by PCR NEGATIVE NEGATIVE Final    Comment:        The GeneXpert MRSA Assay (FDA approved for NASAL specimens only), is one component of a comprehensive MRSA colonization surveillance program. It is not intended to diagnose  MRSA infection nor to guide or monitor treatment for MRSA infections.          Studies: Dg Chest 2 View  01/29/2016  CLINICAL DATA:  Acute onset of shortness of breath, cough and congestion. Initial encounter. EXAM: CHEST  2 VIEW COMPARISON:  Chest radiograph from 01/28/2016 FINDINGS: The lungs are well-aerated. Vascular congestion is noted. Mildly increased interstitial markings could reflect minimal interstitial edema. There is no evidence of pleural effusion or pneumothorax. The heart is normal in size; the mediastinal contour is within normal limits. No acute osseous abnormalities are seen. IMPRESSION: Vascular congestion noted. Mildly increased interstitial markings could reflect minimal interstitial edema. Electronically Signed   By: Garald Balding M.D.   On: 01/29/2016 02:43   Dg Chest 2 View  01/28/2016  CLINICAL DATA:  Productive cough for 2 days. Shortness of breath and weakness today. Initial encounter. EXAM: CHEST  2 VIEW COMPARISON:  PA and lateral chest 04/21/2015 and 12/28/2012. FINDINGS: The lungs are clear. Heart size is normal. There is no pneumothorax or pleural effusion. No focal bony abnormality is identified. IMPRESSION: No acute disease. Electronically Signed   By: Inge Rise M.D.   On: 01/28/2016 18:09   Ct Head Wo Contrast  01/28/2016  CLINICAL DATA:  Acute onset of cough. Worsening generalized weakness and recent falls. Initial encounter. EXAM: CT HEAD WITHOUT CONTRAST TECHNIQUE: Contiguous axial images were obtained from the base of the skull through the vertex without intravenous contrast. COMPARISON:  CT of the head performed 05/25/2013 FINDINGS: There is no evidence of acute infarction, mass lesion, or intra- or extra-axial hemorrhage on CT. Prominence of the ventricles and sulci reflects moderate cortical volume loss. Mild cerebellar atrophy is noted. Diffuse periventricular and subcortical white matter change likely reflects small vessel ischemic microangiopathy.  Chronic ischemic change is noted at the basal ganglia bilaterally. The brainstem and fourth ventricle are within normal limits. The cerebral hemispheres demonstrate grossly normal gray-white differentiation. No mass effect or midline shift is seen. There is no evidence of fracture; visualized osseous structures are unremarkable in appearance. The orbits are within normal limits. The paranasal sinuses and mastoid air cells are well-aerated. No significant soft tissue abnormalities are seen. IMPRESSION: 1. No acute intracranial pathology seen on CT. 2. Moderate cortical volume loss and diffuse small vessel ischemic microangiopathy. 3. Chronic ischemic change at the basal ganglia bilaterally. Electronically Signed   By: Garald Balding M.D.   On: 01/28/2016 23:24        Scheduled Meds: . ampicillin-sulbactam (UNASYN) IV  1.5 g Intravenous Q6H  . antiseptic oral rinse  7 mL Mouth Rinse  BID  . aspirin EC  325 mg Oral Daily  . donepezil  5 mg Oral Daily  . enoxaparin (LOVENOX) injection  30 mg Subcutaneous Q24H  . finasteride  5 mg Oral Daily  . guaiFENesin  1,200 mg Oral BID  . insulin aspart  0-5 Units Subcutaneous QHS  . insulin aspart  0-9 Units Subcutaneous TID WC  . ipratropium  0.5 mg Nebulization QID  . levalbuterol  0.63 mg Nebulization QID  . methylPREDNISolone (SOLU-MEDROL) injection  40 mg Intravenous Q12H  . metoprolol succinate  50 mg Oral QHS  . mirabegron ER  50 mg Oral Daily  . [START ON 01/31/2016] vancomycin  1,250 mg Intravenous Q24H   Continuous Infusions: . sodium chloride 10 mL/hr at 01/29/16 1800    Principal Problem:   Acute respiratory failure with hypoxia (HCC) Active Problems:   Hyperlipidemia   HYPERTENSION, BENIGN   Atrial fibrillation with RVR ([place])   Fall   Acute renal failure superimposed on stage 3 chronic kidney disease (HCC)   BPH (benign prostatic hypertrophy)   Diabetes mellitus without complication (HCC)   Elevated lactic acid level   Sepsis  ([place])    Time spent: 20 minutes.    Vernell Leep, MD, FACP, FHM. [facility] Pager [phone]  If 7PM-7AM, please contact night-coverage [email or web address] Password [email or web address] 01/30/2016, 2:44 PM    LOS: 2 days

## 2016-01-31 ENCOUNTER — Inpatient Hospital Stay (HOSPITAL_COMMUNITY): Payer: Medicare Other

## 2016-01-31 DIAGNOSIS — J209 Acute bronchitis, unspecified: Secondary | ICD-10-CM

## 2016-01-31 DIAGNOSIS — J40 Bronchitis, not specified as acute or chronic: Secondary | ICD-10-CM

## 2016-01-31 DIAGNOSIS — I4891 Unspecified atrial fibrillation: Secondary | ICD-10-CM

## 2016-01-31 LAB — GLUCOSE, CAPILLARY
GLUCOSE-CAPILLARY: 230 mg/dL — AB (ref 65–99)
GLUCOSE-CAPILLARY: 292 mg/dL — AB (ref 65–99)
Glucose-Capillary: 181 mg/dL — ABNORMAL HIGH (ref 65–99)
Glucose-Capillary: 307 mg/dL — ABNORMAL HIGH (ref 65–99)

## 2016-01-31 LAB — CBC
HCT: 40.3 % (ref 39.0–52.0)
HEMOGLOBIN: 13.7 g/dL (ref 13.0–17.0)
MCH: 32.9 pg (ref 26.0–34.0)
MCHC: 34 g/dL (ref 30.0–36.0)
MCV: 96.6 fL (ref 78.0–100.0)
Platelets: 155 10*3/uL (ref 150–400)
RBC: 4.17 MIL/uL — AB (ref 4.22–5.81)
RDW: 13.9 % (ref 11.5–15.5)
WBC: 21 10*3/uL — ABNORMAL HIGH (ref 4.0–10.5)

## 2016-01-31 LAB — BASIC METABOLIC PANEL
ANION GAP: 10 (ref 5–15)
BUN: 35 mg/dL — ABNORMAL HIGH (ref 6–20)
CHLORIDE: 104 mmol/L (ref 101–111)
CO2: 25 mmol/L (ref 22–32)
CREATININE: 1.76 mg/dL — AB (ref 0.61–1.24)
Calcium: 8.5 mg/dL — ABNORMAL LOW (ref 8.9–10.3)
GFR calc non Af Amer: 33 mL/min — ABNORMAL LOW (ref >60)
GFR, EST AFRICAN AMERICAN: 38 mL/min — AB (ref >60)
GLUCOSE: 172 mg/dL — AB (ref 65–99)
Potassium: 4.7 mmol/L (ref 3.5–5.1)
Sodium: 139 mmol/L (ref 135–145)

## 2016-01-31 LAB — CULTURE, RESPIRATORY W GRAM STAIN
Culture: NORMAL
Gram Stain: NONE SEEN

## 2016-01-31 LAB — PROCALCITONIN: PROCALCITONIN: 0.18 ng/mL

## 2016-01-31 MED ORDER — PREDNISONE 20 MG PO TABS
40.0000 mg | ORAL_TABLET | Freq: Every day | ORAL | Status: DC
  Administered 2016-01-31 – 2016-02-02 (×3): 40 mg via ORAL
  Filled 2016-01-31 (×3): qty 2

## 2016-01-31 MED ORDER — AMLODIPINE BESYLATE 5 MG PO TABS
5.0000 mg | ORAL_TABLET | Freq: Every day | ORAL | Status: DC
  Administered 2016-01-31 – 2016-02-02 (×3): 5 mg via ORAL
  Filled 2016-01-31 (×3): qty 1

## 2016-01-31 MED ORDER — AMOXICILLIN-POT CLAVULANATE 875-125 MG PO TABS
1.0000 | ORAL_TABLET | Freq: Two times a day (BID) | ORAL | Status: DC
  Administered 2016-01-31 – 2016-02-02 (×4): 1 via ORAL
  Filled 2016-01-31 (×5): qty 1

## 2016-01-31 NOTE — Progress Notes (Signed)
Attempted report. RN busy and plans to call back. Will continue to monitor patient.

## 2016-01-31 NOTE — Clinical Social Work Note (Signed)
Clinical Social Work Assessment  Patient Details  Name: [name] [name] MRN: [id number] Date of Birth: 19-Dec-1926  Date of referral:  01/31/16               Reason for consult:  Facility Placement, Discharge Planning                Permission sought to share information with:   (n/a) Permission granted to share information::  Yes, Verbal Permission Granted  Name::      (n/a)  Agency::   (SNF)  Relationship::   (n/a)  Contact Information:   (n/a)  Housing/Transportation Living arrangements for the past 2 months:  Single Family Home Source of Information:  Patient Patient Interpreter Needed:  None Criminal Activity/Legal Involvement Pertinent to Current Situation/Hospitalization:  No - Comment as needed Significant Relationships:  Spouse Lives with:  Spouse Do you feel safe going back to the place where you live?  No Need for family participation in patient care:  Yes (Comment)  Care giving concerns:   Patient has not expressed any care giving concerns at this time.   Social Worker assessment / plan:   Patient a/o x3. BSW intern has spoken with patient at bedside in reference to discharge disposition. BSW intern made patient aware of SNF recommendations given by PT. BSW intern presented patient with SNF list and further explained SNF process, as well insurance coverage. Patient lives at home with spouse and states that 24 hour supervision will be provided. BSW intern to f/u with pt in regards to extended bed offers once clinical have been reviewed by facility. BSW intern to continue to follow pt for post-discharge planning.  Employment status:  Retired Forensic scientist:  Medicare PT Recommendations:  [place] / Referral to community resources:  [place]  Patient/Family's Response to care:   Patient seemed agreeable in d/c to SNF to complete therapy. Patient appreciated Social Work intervention given by [employer].  Patient/Family's Understanding of and Emotional Response to Diagnosis, Current Treatment, and Prognosis:   Patient understood need for further medical care and rehab at SNF.  Emotional Assessment Appearance:  Appears younger than stated age Attitude/Demeanor/Rapport:   (pleasant) Affect (typically observed):  Accepting, Appropriate, Calm Orientation:  Oriented to Self, Oriented to Place, Oriented to Situation Alcohol / Substance use:  Not Applicable Psych involvement (Current and /or in the community):  No (Comment)  Discharge Needs  Concerns to be addressed:  No discharge needs identified Readmission within the last 30 days:  No Current discharge risk:  None Barriers to Discharge:  No Barriers Identified   Leane Call, Student-SW 01/31/2016, 9:23 AM

## 2016-01-31 NOTE — Clinical Social Work Note (Signed)
CSW received referral for SNF.  Case discussed with case manager and plan is to discharge home with home health.  CSW to sign off please re-consult if social work needs arise.  Joshwa Hemric R. Jayse Hodkinson, MSW, LCSWA [phone]  

## 2016-01-31 NOTE — Progress Notes (Signed)
PT Cancellation Note  Patient Details Name: DAESON TEHRANI MRN: [id number] DOB: 07-Sep-1926   Cancelled Treatment:    Reason Eval/Treat Not Completed: Patient at procedure or test/unavailable (pt getting resp treatment with percussive treatment). Will follow as time permits.   Duncan Dull 01/31/2016, 11:32 AM Alben Deeds, PT DPT  [phone]

## 2016-01-31 NOTE — Progress Notes (Signed)
CM was alerted by bedside RN that patient and wife are hopeful for discharge to home, not SNF.  CM met with patient and wife, who verified that they intend to discharge home with home health services.  They have chosen [facility]. Voicemail was left for [facility] with [name]. Awaiting return call.  CM notified Dr Algis Liming and orders for [facility] were received.  CM notified CSW of change in disposition.   Lorne Skeens RN, MSN [phone]

## 2016-01-31 NOTE — Clinical Social Work Placement (Signed)
   CLINICAL SOCIAL WORK PLACEMENT  NOTE  Date:  01/31/2016  Patient Details  Name: [name] [name] MRN: [id number] Date of Birth: June 12, 1926  Clinical Social Work is seeking post-discharge placement for this patient at the [place] level of care (*CSW will initial, date and re-position this form in  chart as items are completed):  Yes   Patient/family provided with [facility] Work Department's list of facilities offering this level of care within the geographic area requested by the patient (or if unable, by the patient's family).  Yes   Patient/family informed of their freedom to choose among providers that offer the needed level of care, that participate in Medicare, Medicaid or managed care program needed by the patient, have an available bed and are willing to accept the patient.  Yes   Patient/family informed of [facility]'s ownership interest in [facility] and [facility], as well as of the fact that they are under no obligation to receive care at these facilities.  PASRR submitted to EDS on 01/31/16     PASRR number received on 01/31/16     Existing PASRR number confirmed on       FL2 transmitted to all facilities in geographic area requested by pt/family on 01/31/16     FL2 transmitted to all facilities within larger geographic area on       Patient informed that his/her managed care company has contracts with or will negotiate with certain facilities, including the following:            Patient/family informed of bed offers received.  Patient chooses bed at       Physician recommends and patient chooses bed at      Patient to be transferred to   on  .  Patient to be transferred to facility by       Patient family notified on   of transfer.  Name of family member notified:        PHYSICIAN Please sign FL2     Additional Comment:    _______________________________________________ Ross Ludwig, LCSWA 01/31/2016,  11:41 AM

## 2016-01-31 NOTE — Progress Notes (Signed)
  Echocardiogram 2D Echocardiogram has been performed.  [name] [name] 01/31/2016, 8:39 AM

## 2016-01-31 NOTE — Progress Notes (Addendum)
PROGRESS NOTE    [name] [name] [id number] DOB: 03-Dec-1926 DOA: 01/28/2016 PCP: Haywood Pao, MD  Primary cardiologist: Dr. Sherren Mocha  HPI/Brief narrative 82 year old male with PMH of HTN, HLD, DM 2, chronic A. fib- Taken off Xarelto due to noncompliance and currently on aspirin, OSA-not on CPAP, stage III CKD, presented to [facility] ED on 01/28/16 with complaints of runny nose, productive cough and dyspnea. No fever, chills or sore throat. History of generalized weakness and several falls over the last few months including on day of admission. In ED, patient was found to have WBC 15.8, lactate 2.79, temperature 100.4, A. fib with RVR with heart rate 110-120, oxygen desaturation to 84%, worsening renal function, negative chest x-ray for acute abnormalities. He was admitted for sepsis due to pneumonia, acute respiratory failure with hypoxia and A. fib with RVR. CCM was consulted. Patient was admitted to stepdown. Clinically improved. Transferring to medical floor on 2/10. Possible DC to home with home health services on 2/11. Patient and spouse declined SNF.   Assessment/Plan:   1. Acute purulent bronchitis: Initial and subsequent chest x-rays did not reveal focal infiltrate. Treated per sepsis protocol. Antibiotics were broadened to vancomycin and Unasyn. CCM input appreciated. DC'ed vancomycin & Unasyn. Changed to oral Augmentin on 1/10 to complete total 7 days treatment. Chest PT ordered. Changed IV Solu-Medrol to oral prednisone-recommend quick taper. 2. Sepsis: Present on admission. Secondary to problem #1. Treated per sepsis protocol with aggressive IV fluid resuscitation and broad-spectrum IV antibiotics. Sepsis physiology resolved. MRSA PCR: Negative. Lactate has finally normalized. Pro-calcitonin 0.12 > 0.19. Sputum culture: Normal OP flora. Influenza panel PCR: Negative. Urine culture: Neg. Urine microscopy: Not indicative of UTI. Urinary strep and Legionella antigen: Negative. Blood  cultures 2: Negative to date. 3. Acute respiratory failure with hypoxia: Likely secondary to problem #1. Management as above. Titrate/wean oxygen as tolerated. Chest physical therapy. Incentive spirometry. Low index of suspicion for PE. Clinically not felt to be in CHF. 2-D echo results as below. 4. Chronic atrial fibrillation with RVR: CHA2DS2-VASc Score is 4. Patient noncompliant and hence taken off of Xarelto, per spouse. Continue aspirin. RVR on admission likely precipitated by sepsis physiology and has improved. Continue metoprolol XL. Was not started on Cardizem drip. Controlled ventricular rate. EKG 2/10: A. fib with ventricular rate of 81, low voltage, normal axis, no acute changes, QTC 471 ms. 5. Essential hypertension: Mildly uncontrolled. Continue metoprolol. Add low-dose amlodipine 5 MG daily. 6. Acute on stage III chronic kidney disease: Baseline creatinine apparently in the 1.5-1.6 range. Creatinine on admission 1.95. May be prerenal due to dehydration from sepsis. IV fluids. Avoid nephrotoxic medications. Patient's creatinine is fluctuating but creatinine of 1.7 may be new baseline. Periodically follow BMP as outpatient. 7. Hyperlipidemia: Not on medications at home. 8. BPH: Continue Proscar and Myrbrtriq 9. Type II DM: A1c on 04/21/15:7.5. Not on medications at home. Currently on steroids. Continue SSI. Mildly uncontrolled. Reduce steroids as soon as possible. 10. Frequent falls: Likely multifactorial. CT head without acute findings. SNF at discharge. 11. Dementia: Mental status supposedly at baseline. 12. Leukocytosis: Likely steroid effect. Follow CBCs periodically. Slightly better.    DVT prophylaxis: Lovenox Code Status: Full Family Communication: Discussed extensively with spouse at bedside on 01/29/16. None at bedside today. Disposition Plan: Transfer to medical floor 2/10. Possible DC to home with home health services on 2/11. Patient and spouse declined  SNF.   Consultants:  CCM  Procedures:  None  Antimicrobials:  IV azithromycin 1  dose  IV Rocephin 1 dose  IV Unasyn 2/7 > 2/10  IV vancomycin 2/7 > 2/9   Oral Augmentin 2/10 > 2/13  Subjective: No acute events per RN. Cough and dyspnea improved per patient.  Objective: Filed Vitals:   01/31/16 0600 01/31/16 0752 01/31/16 0843 01/31/16 1123  BP:  145/105  149/102  Pulse: 73 68  89  Temp:  98.3 F (36.8 C)  97.6 F (36.4 C)  TempSrc:  Oral  Oral  Resp:  21  26  Weight:      SpO2:  97% 96% 96%    Intake/Output Summary (Last 24 hours) at 01/31/16 1341 Last data filed at 01/31/16 0900  Gross per 24 hour  Intake    700 ml  Output   1150 ml  Net   -450 ml   Filed Weights   01/29/16 1320 01/30/16 0400 01/31/16 0413  Weight: 105.2 kg (231 lb 14.8 oz) 104.8 kg (231 lb 0.7 oz) 104.8 kg (231 lb 0.7 oz)    Exam:  General exam: Moderately built and overweight pleasant elderly male lying comfortably propped up in bed. Respiratory system: Clear to auscultation. No increased work of breathing. Cardiovascular system: S1 & S2 heard, irregularly irregular. No JVD, murmurs, gallops, clicks or pedal edema. Telemetry: A. fib with ventricular rate in the 70s-90s.  Gastrointestinal system: Abdomen is nondistended, soft and nontender. Normal bowel sounds heard. Central nervous system: Alert and oriented 2. No focal neurological deficits. Extremities: Symmetric 5 x 5 power.   Data Reviewed: Basic Metabolic Panel:  Recent Labs Lab 01/28/16 1758 01/30/16 0528 01/31/16 0510  NA 140 137 139  K 4.3 5.1 4.7  CL 100* 107 104  CO2 25 20* 25  GLUCOSE 174* 217* 172*  BUN 21* 27* 35*  CREATININE 1.95* 1.45* 1.76*  CALCIUM 9.6 8.7* 8.5*   Liver Function Tests:  Recent Labs Lab 01/28/16 1758  AST 30  ALT 26  ALKPHOS 75  BILITOT 0.9  PROT 8.0  ALBUMIN 4.0   No results for input(s): LIPASE, AMYLASE in the last 168 hours. No results for input(s): AMMONIA in the  last 168 hours. CBC:  Recent Labs Lab 01/28/16 1758 01/30/16 0528 01/31/16 0510  WBC 15.8* 32.0* 21.0*  NEUTROABS 13.7*  --   --   HGB 14.3 14.8 13.7  HCT 42.9 42.9 40.3  MCV 96.4 96.0 96.6  PLT 163 152 155   Cardiac Enzymes:  Recent Labs Lab 01/28/16 2040 01/29/16 0342 01/29/16 0844  TROPONINI <0.03 0.03 <0.03   BNP (last 3 results) No results for input(s): PROBNP in the last 8760 hours. CBG:  Recent Labs Lab 01/30/16 1122 01/30/16 1645 01/30/16 2145 01/31/16 0755 01/31/16 1213  GLUCAP 225* 296* 167* 181* 230*    Recent Results (from the past 240 hour(s))  Blood culture (routine x 2)     Status: None (Preliminary result)   Collection Time: 01/28/16  4:20 PM  Result Value Ref Range Status   Specimen Description BLOOD LEFT HAND  Final   Special Requests IN PEDIATRIC BOTTLE 4CC  Final   Culture NO GROWTH 2 DAYS  Final   Report Status PENDING  Incomplete  Blood culture (routine x 2)     Status: None (Preliminary result)   Collection Time: 01/28/16  6:15 PM  Result Value Ref Range Status   Specimen Description BLOOD RIGHT ANTECUBITAL  Final   Special Requests BOTTLES DRAWN AEROBIC AND ANAEROBIC 5CC  Final   Culture NO GROWTH 2 DAYS  Final   Report Status PENDING  Incomplete  Urine culture     Status: None   Collection Time: 01/28/16  9:50 PM  Result Value Ref Range Status   Specimen Description URINE, CATHETERIZED  Final   Special Requests NONE  Final   Culture NO GROWTH 1 DAY  Final   Report Status 01/30/2016 FINAL  Final  Culture, respiratory (NON-Expectorated)     Status: None   Collection Time: 01/28/16 10:19 PM  Result Value Ref Range Status   Specimen Description SPUTUM  Final   Special Requests NONE  Final   Gram Stain   Final    NO WBC SEEN FEW SQUAMOUS EPITHELIAL CELLS PRESENT NO ORGANISMS SEEN THIS SPECIMEN IS ACCEPTABLE FOR SPUTUM CULTURE Performed at [facility]    Culture   Final    NORMAL OROPHARYNGEAL FLORA Performed at  [facility]    Report Status 01/31/2016 FINAL  Final  MRSA PCR Screening     Status: None   Collection Time: 01/29/16  1:24 PM  Result Value Ref Range Status   MRSA by PCR NEGATIVE NEGATIVE Final    Comment:        The GeneXpert MRSA Assay (FDA approved for NASAL specimens only), is one component of a comprehensive MRSA colonization surveillance program. It is not intended to diagnose MRSA infection nor to guide or monitor treatment for MRSA infections.          Studies: Dg Chest Port 1 View  01/31/2016  CLINICAL DATA:  Bronchitis.  Hypertension. EXAM: PORTABLE CHEST 1 VIEW COMPARISON:  01/29/2016 FINDINGS: Atherosclerotic aortic arch. Heart size within normal limits for projection. Mild chronic interstitial accentuation of both lungs. No airspace opacity identified. Mild central airway thickening. IMPRESSION: 1. Airway thickening is present, suggesting bronchitis or reactive airways disease. 2. Mild chronic interstitial accentuation in both lungs. 3. Atherosclerosis. Electronically Signed   By: Van Clines M.D.   On: 01/31/2016 07:05        Scheduled Meds: . amoxicillin-clavulanate  1 tablet Oral BID  . antiseptic oral rinse  7 mL Mouth Rinse BID  . aspirin EC  325 mg Oral Daily  . donepezil  5 mg Oral Daily  . enoxaparin (LOVENOX) injection  30 mg Subcutaneous Q24H  . finasteride  5 mg Oral Daily  . guaiFENesin  1,200 mg Oral BID  . insulin aspart  0-5 Units Subcutaneous QHS  . insulin aspart  0-9 Units Subcutaneous TID WC  . ipratropium  0.5 mg Nebulization QID  . levalbuterol  0.63 mg Nebulization QID  . metoprolol succinate  50 mg Oral QHS  . mirabegron ER  50 mg Oral Daily  . predniSONE  40 mg Oral Q breakfast   Continuous Infusions: . sodium chloride 10 mL/hr at 01/29/16 1800    Principal Problem:   Acute respiratory failure with hypoxia (HCC) Active Problems:   Hyperlipidemia   HYPERTENSION, BENIGN   Atrial fibrillation with RVR  ([place])   Fall   Acute renal failure superimposed on stage 3 chronic kidney disease (HCC)   BPH (benign prostatic hypertrophy)   Diabetes mellitus without complication (HCC)   Elevated lactic acid level   Sepsis ([place])    Time spent: 20 minutes.    Vernell Leep, MD, FACP, FHM. [facility] Pager [phone]  If 7PM-7AM, please contact night-coverage [email or web address] Password [email or web address] 01/31/2016, 1:41 PM    LOS: 3 days

## 2016-01-31 NOTE — Progress Notes (Signed)
Physical Therapy Treatment Patient Details Name: [name] [name] MRN: [id number] DOB: 1926-12-21 Today's Date: 01/31/2016    History of Present Illness pt is an 82 y/o male with h/o HTN, DM and chronic afib, admitted with respiratory distress and feeling sick for a few days.  Treatment for CAP.    PT Comments    Patient seen for mobility progression and activity. Patient still remains significantly limited with all aspects of functional mobility. Patient could not tolerated much activity without becoming very DOE and fatigued. Patient only able to mobilize short distance despite O2 and rest break. At this time, do not feel patient is safe for d/c home and highly recommend ST SNF to decreased burden of care.   Follow Up Recommendations  SNF;Other (comment) (Though pt/wife wish to go straight home if possible.)     Equipment Recommendations   (?3 in 1)    Recommendations for Other Services       Precautions / Restrictions Precautions Precautions: Fall Restrictions Weight Bearing Restrictions: No    Mobility  Bed Mobility Overal bed mobility: Needs Assistance Bed Mobility: Supine to Sit;Sit to Supine     Supine to sit: Min assist     General bed mobility comments: Vcs for positioning, increased assist to elevate trunk and rotate hips to EOB, patient with increased exertion to scoot to EOB this session  Transfers Overall transfer level: Needs assistance Equipment used: Rolling walker (2 wheeled) Transfers: Sit to/from Stand Sit to Stand: Mod assist         General transfer comment: Pt was unstead on his feet with posterior lean.  Without outside assist pt would fall.  Ambulation/Gait Ambulation/Gait assistance: Min assist +2(chair follow needed) Ambulation Distance (Feet): 20 Feet Assistive device: Rolling walker (2 wheeled) Gait Pattern/deviations: Step-through pattern;Decreased step length - right;Decreased step length - left;Decreased stride length Gait  velocity: slow Gait velocity interpretation: <1.8 ft/sec, indicative of risk for recurrent falls General Gait Details: poor positioning with RW, increased DOE and noted fatigue with very minimal activity. Increased WOB (RR increased mid 40s with accessory muscles, rattling sounds during attempts to talk and cough) Assist required for stability with increased sway (limited by dizziness, one rest break after 10')   Stairs            Wheelchair Mobility    Modified Rankin (Stroke Patients Only)       Balance   Sitting-balance support: Feet supported Sitting balance-Leahy Scale: Fair Sitting balance - Comments: doesn't accept challenge well   Standing balance support: Bilateral upper extremity supported Standing balance-Leahy Scale: Poor Standing balance comment: heavy reliance on RW and tripoding noted during standing rest break                    Cognition Arousal/Alertness: Awake/alert Behavior During Therapy: WFL for tasks assessed/performed Overall Cognitive Status: History of cognitive impairments - at baseline       Memory: Decreased short-term memory              Exercises Other Exercises Other Exercises: performed general LE ther ex in supine, Ankle pumps, heel slides, Straight leg raises (limited ROM), abduction/adduction.  Other Exercises: Sitting EOB perform [place], patient with increased DOE/fatigue. Sat for 3 minutes to monitor vitals    General Comments General comments (skin integrity, edema, etc.): O2 on 3 liters [place]. elevated HR and RR with very limited activity.      Pertinent Vitals/Pain Pain Assessment: No/denies pain    Home Living  Prior Function            PT Goals (current goals can now be found in the care plan section) Acute Rehab PT Goals Patient Stated Goal: get back home PT Goal Formulation: With patient/family Time For Goal Achievement: 02/12/16 Potential to Achieve Goals: Good     Frequency  Min 3X/week    PT Plan Current plan remains appropriate    Co-evaluation             End of Session Equipment Utilized During Treatment: Oxygen Activity Tolerance: Patient tolerated treatment well;Patient limited by fatigue Patient left: in chair;with call bell/phone within reach;with chair alarm set     Time: 1204-1228 PT Time Calculation (min) (ACUTE ONLY): 24 min  Charges:  $Therapeutic Exercise: 8-22 mins $Therapeutic Activity: 8-22 mins                    G CodesDuncan Dull 2016/02/28, 2:07 PM Alben Deeds, [place] DPT  [phone]

## 2016-01-31 NOTE — Care Management Important Message (Signed)
Important Message  Patient Details  Name: [name] [name] MRN: [id number] Date of Birth: Mar 31, 1926   Medicare Important Message Given:  Yes    [name] P Dempsey Knotek 01/31/2016, 2:56 PM

## 2016-01-31 NOTE — Progress Notes (Signed)
PULMONARY CONSULT NOTE  Requesting MD/Service: Blaine Hamper MD Date of initial consultation: 01/29/2016 Reason for consultation: Respiratory Distress  PT PROFILE: 82 y/o M with chronic atrial fibrillation, OSA, DMII, HTN, HLD, CKD 3, BPH.    HPI: Pt. States that he has been feeling sick for the past few days, has been "coughing up stuff", green phlegm. He denies fevers, chills, nausea, chest pain. Denies shortness of breath, abdominal pain, jaw, neck, or arm pain. He initially cannot remember why he came to the ED, and then says he was "just feeling sick". No lower extremity pain, swelling, or erythema.   In the ED, pt. Found to initially be very mildly hypoxic to 89% on room air, but subsequently was able to maintain O2 saturations on his own. He did have an elevated WBC at 15.8, Temp of 100.4, a coarse / raspy sounding cough with a normal CXR. Significantly, his Lactic Acid was noted to be 2.79 > 3.5>4.5. Blood cultures were drawn, he was given IV fluids, and he was started empirically on Rocephin and Azithromycin. A Urinalysis was performed and was negative, Flu negative, Troponin negative, Procalcitonin 0.12. ABG was notable for poorly compensated metabolic acidosis. PH 7.29.   Subjective:  Pt clearing secretions with chest vest   Past Medical History  Diagnosis Date  . Hypertension   . Hyperlipidemia   . Chronic atrial fibrillation ([place])   . Urinary retention foley last changed 3 weeks ago  . Arthritis   . Sleep apnea     no cpap used, sleep study was several yrs ago  . Diabetes mellitus without complication [facility])     Past Surgical History  Procedure Laterality Date  . Back surgery  yrs ago    lower back surgery  . Tonsillectomy  as child  . Prostate surgery  yrs ago, no cancer  . Transurethral resection of prostate  02/22/2012    Procedure: TRANSURETHRAL RESECTION OF THE PROSTATE WITH GYRUS INSTRUMENTS;  Surgeon: Molli Hazard, MD;  Location: [facility] ORS;  Service: Urology;   Laterality: N/A;        MEDICATIONS: I have reviewed all medications and confirmed regimen as documented  Social History   Social History  . Marital Status: Married    Spouse Name: N/A  . Number of Children: N/A  . Years of Education: N/A   Occupational History  . Not on file.   Social History Main Topics  . Smoking status: Former Smoker -- 0.50 packs/day for 50 years    Types: Cigarettes  . Smokeless tobacco: Never Used  . Alcohol Use: Yes     Comment: rare use  . Drug Use: No  . Sexual Activity: No   Other Topics Concern  . Not on file   Social History Narrative    Family History  Problem Relation Age of Onset  . Heart attack Father       Filed Vitals:   01/31/16 0600 01/31/16 0752 01/31/16 0843 01/31/16 1123  BP:  145/105  149/102  Pulse: 73 68  89  Temp:  98.3 F (36.8 C)  97.6 F (36.4 C)  TempSrc:  Oral  Oral  Resp:  21  26  Weight:      SpO2:  97% 96% 96%     EXAM:  Gen: WDWN, No overt respiratory distress HEENT: NCAT, MMM, Raspy cough, no visible sputum but sounds like he has some caught in his throat, nontender. Neck: Supple without LAN, thyromegaly Lungs: breath sounds Coarse bilaterally, appropriate rate, unlabored,  2L [place] for comfort.  Cardiovascular: Tachycardic, irregularly irregular rhythm no murmurs noted Ext: without clubbing, cyanosis, LE venous insufficiency, no erythema, no edema.  Neuro: CNs grossly intact, motor and sensory intact, DTRs symmetric Skin: Limited exam, no lesions noted  DATA:   BMP Latest Ref Rng 01/31/2016 01/30/2016 01/28/2016  Glucose 65 - 99 mg/dL 172(H) 217(H) 174(H)  BUN 6 - 20 mg/dL 35(H) 27(H) 21(H)  Creatinine 0.61 - 1.24 mg/dL 1.76(H) 1.45(H) 1.95(H)  Sodium 135 - 145 mmol/L 139 137 140  Potassium 3.5 - 5.1 mmol/L 4.7 5.1 4.3  Chloride 101 - 111 mmol/L 104 107 100(L)  CO2 22 - 32 mmol/L 25 20(L) 25  Calcium 8.9 - 10.3 mg/dL 8.5(L) 8.7(L) 9.6    CBC Latest Ref Rng 01/31/2016 01/30/2016 01/28/2016  WBC 4.0  - 10.5 K/uL 21.0(H) 32.0(H) 15.8(H)  Hemoglobin 13.0 - 17.0 g/dL 13.7 14.8 14.3  Hematocrit 39.0 - 52.0 % 40.3 42.9 42.9  Platelets 150 - 400 K/uL 155 152 163    CXR:   2/10: probable bronchitis, no overt infiltrates to support PNA  IMPRESSION:     ICD-9-CM ICD-10-CM   1. CAP (community acquired pneumonia) 74 J18.9   2. Hypoxia 799.02 R09.02   3. Productive cough 786.2 R05   4. Elevated lactic acid level 276.2 E87.2   5. Fall E888.9 W19.XXXA CT Head Wo Contrast     CT Head Wo Contrast  6. SOB (shortness of breath) 786.05 R06.02 DG Chest 2 View     DG Chest 2 View  7. Bronchitis 78 J40 DG CHEST PORT 1 VIEW     DG CHEST PORT 1 VIEW   Purulent bronchitis Severe sepsis without shock Afib RVR  PLAN:  - continue aggressive pulm hygiene - Follow Blood Cultures, Urine Cx - agree with simplifying abx - BD's as ordered.   Please call if we can help in any way.   Baltazar Apo, MD, PhD 01/31/2016, 12:44 PM [facility] [phone] or if no answer [phone]

## 2016-01-31 NOTE — NC FL2 (Signed)
[place] LEVEL OF CARE SCREENING TOOL     IDENTIFICATION  Patient Name: [name] [name] Birthdate: 1926/12/05 Sex: male Admission Date (Current Location): 01/28/2016  [facility] and [place] Number:  Herbalist and Address:  The [place], [place] [address] [place], [place] [address]      Provider Number: [id number]  Attending Physician Name and Address:  Modena Jansky, MD  Relative Name and Phone Number:  [place], [place] [phone] or [phone]    Current Level of Care: Hospital Recommended Level of Care: [place] Prior Approval Number:    Date Approved/Denied:   PASRR Number: [id number]  Discharge Plan: SNF    Current Diagnoses: Patient Active Problem List   Diagnosis Date Noted  . CAP (community acquired pneumonia)   . Diabetes mellitus without complication ([place]) 123XX123  . Acute respiratory failure with hypoxia ([place]) 01/28/2016  . Elevated lactic acid level 01/28/2016  . Sepsis ([place]) 01/28/2016  . Acute renal failure superimposed on stage 3 chronic kidney disease ([place]) 04/21/2015  . Hyperglycemia 04/21/2015  . Bronchitis 04/21/2015  . Tachycardia 04/21/2015  . BPH (benign prostatic hypertrophy) 04/21/2015  . Dyspnea 12/28/2012  . Fall 12/28/2012  . OTHER DYSPNEA AND RESPIRATORY ABNORMALITIES 05/27/2010  . Hyperlipidemia 11/27/2008  . HYPERTENSION, BENIGN 11/27/2008  . Atrial fibrillation with RVR ([place]) 11/27/2008  . GERD 11/27/2008  . BPH/LUTS W/O OBSTRUCTION 11/27/2008  . ARTHRITIS 11/27/2008    Orientation RESPIRATION BLADDER Height & Weight     Self, Situation, Place  O2 Continent Weight: 231 lb 0.7 oz (104.8 kg) Height:     BEHAVIORAL SYMPTOMS/MOOD NEUROLOGICAL BOWEL NUTRITION STATUS      Continent Diet ([place] )  AMBULATORY STATUS COMMUNICATION OF NEEDS Skin   Limited Assist Verbally Normal                       Personal Care Assistance Level of Assistance   Bathing, Dressing Bathing Assistance: Limited assistance   Dressing Assistance: Limited assistance     Functional Limitations Info             SPECIAL CARE FACTORS FREQUENCY  PT (By licensed PT)     PT Frequency: 5x a week              Contractures      Additional Factors Info  Insulin Sliding Scale, Allergies   Allergies Info: SULFA ANTIBIOTICS   Insulin Sliding Scale Info: 3x a day       Current Medications (01/31/2016):  This is the current hospital active medication list Current Facility-Administered Medications  Medication Dose Route Frequency Provider Last Rate Last Dose  . 0.9 %  sodium chloride infusion   Intravenous Continuous Modena Jansky, MD 10 mL/hr at 01/29/16 1800    . acetaminophen (TYLENOL) tablet 650 mg  650 mg Oral Q6H PRN Modena Jansky, MD      . ampicillin-sulbactam (UNASYN) 1.5 g in sodium chloride 0.9 % 50 mL IVPB  1.5 g Intravenous Q6H Kris Mouton, RPH   1.5 g at 01/31/16 [id number]  . antiseptic oral rinse (CPC / CETYLPYRIDINIUM CHLORIDE 0.05%) solution 7 mL  7 mL Mouth Rinse BID Modena Jansky, MD   7 mL at 01/30/16 2200  . aspirin EC tablet 325 mg  325 mg Oral Daily Ivor Costa, MD   325 mg at 01/30/16 1019  . donepezil (ARICEPT) tablet 5 mg  5 mg Oral Daily Ivor Costa, MD  5 mg at 01/30/16 1018  . enoxaparin (LOVENOX) injection 30 mg  30 mg Subcutaneous Q24H Valeda Malm Rumbarger, RPH   30 mg at 01/30/16 2032  . finasteride (PROSCAR) tablet 5 mg  5 mg Oral Daily Ivor Costa, MD   5 mg at 01/30/16 1019  . guaiFENesin (MUCINEX) 12 hr tablet 1,200 mg  1,200 mg Oral BID Modena Jansky, MD   1,200 mg at 01/30/16 2201  . insulin aspart (novoLOG) injection 0-5 Units  0-5 Units Subcutaneous QHS Ivor Costa, MD   2 Units at 01/29/16 2135  . insulin aspart (novoLOG) injection 0-9 Units  0-9 Units Subcutaneous TID WC Ivor Costa, MD   5 Units at 01/30/16 1750  . ipratropium (ATROVENT) nebulizer solution 0.5 mg  0.5 mg Nebulization QID Modena Jansky, MD    0.5 mg at 01/31/16 0843  . levalbuterol (XOPENEX) nebulizer solution 0.63 mg  0.63 mg Nebulization Q6H PRN Ivor Costa, MD      . levalbuterol Penne Lash) nebulizer solution 0.63 mg  0.63 mg Nebulization QID Modena Jansky, MD   0.63 mg at 01/31/16 0843  . metoprolol succinate (TOPROL-XL) 24 hr tablet 50 mg  50 mg Oral QHS Ivor Costa, MD   50 mg at 01/30/16 2202  . mirabegron ER (MYRBETRIQ) tablet 50 mg  50 mg Oral Daily Ivor Costa, MD   50 mg at 01/30/16 1019  . predniSONE (DELTASONE) tablet 40 mg  40 mg Oral Q breakfast Modena Jansky, MD         Discharge Medications: Please see discharge summary for a list of discharge medications.  Relevant Imaging Results:  Relevant Lab Results:   Additional Information SSN [id number]  Ross Ludwig, [place]

## 2016-01-31 NOTE — Care Management Note (Signed)
Case Management Note  Patient Details  Name: [name] [name] MRN: [id number] Date of Birth: 12-Feb-1926  Subjective/Objective:                    Action/Plan: CM spoke with [name] with [name], per patient's request. Referral was accepted for potential discharge tomorrow 01/31/16 with [facility] PT/OT/RN/aide.  No other discharge needs identified at this time. CM will continue to follow.  Expected Discharge Date:                  Expected Discharge Plan:  [name]  In-House Referral:     Discharge planning Services  CM Consult  Post Acute Care Choice:  Home Health Choice offered to:  Patient, Spouse  DME Arranged:    DME Agency:     HH Arranged:  RN, PT, OT, Nurse's Aide HH Agency:  [place]  Status of Service:  Completed, signed off  Medicare Important Message Given:    Date Medicare IM Given:    Medicare IM give by:    Date Additional Medicare IM Given:    Additional Medicare Important Message give by:     If discussed at [place] of Stay Meetings, dates discussed:    Additional CommentsRolm Baptise, RN 01/31/2016, 2:41 PM [phone]

## 2016-02-01 DIAGNOSIS — N179 Acute kidney failure, unspecified: Secondary | ICD-10-CM

## 2016-02-01 DIAGNOSIS — E119 Type 2 diabetes mellitus without complications: Secondary | ICD-10-CM

## 2016-02-01 DIAGNOSIS — N183 Chronic kidney disease, stage 3 (moderate): Secondary | ICD-10-CM

## 2016-02-01 LAB — BASIC METABOLIC PANEL
Anion gap: 11 (ref 5–15)
BUN: 36 mg/dL — ABNORMAL HIGH (ref 6–20)
CO2: 23 mmol/L (ref 22–32)
Calcium: 8.8 mg/dL — ABNORMAL LOW (ref 8.9–10.3)
Chloride: 104 mmol/L (ref 101–111)
Creatinine, Ser: 1.49 mg/dL — ABNORMAL HIGH (ref 0.61–1.24)
GFR calc Af Amer: 46 mL/min — ABNORMAL LOW (ref >60)
GFR calc non Af Amer: 40 mL/min — ABNORMAL LOW (ref >60)
Glucose, Bld: 163 mg/dL — ABNORMAL HIGH (ref 65–99)
Potassium: 4.4 mmol/L (ref 3.5–5.1)
Sodium: 138 mmol/L (ref 135–145)

## 2016-02-01 LAB — GLUCOSE, CAPILLARY
GLUCOSE-CAPILLARY: 210 mg/dL — AB (ref 65–99)
GLUCOSE-CAPILLARY: 284 mg/dL — AB (ref 65–99)
Glucose-Capillary: 187 mg/dL — ABNORMAL HIGH (ref 65–99)
Glucose-Capillary: 215 mg/dL — ABNORMAL HIGH (ref 65–99)

## 2016-02-01 LAB — CBC
HCT: 39.5 % (ref 39.0–52.0)
Hemoglobin: 13.5 g/dL (ref 13.0–17.0)
MCH: 32.6 pg (ref 26.0–34.0)
MCHC: 34.2 g/dL (ref 30.0–36.0)
MCV: 95.4 fL (ref 78.0–100.0)
Platelets: 151 10*3/uL (ref 150–400)
RBC: 4.14 MIL/uL — ABNORMAL LOW (ref 4.22–5.81)
RDW: 13.6 % (ref 11.5–15.5)
WBC: 17.3 10*3/uL — ABNORMAL HIGH (ref 4.0–10.5)

## 2016-02-01 LAB — HEMOGLOBIN A1C
HEMOGLOBIN A1C: 8.3 % — AB (ref 4.8–5.6)
MEAN PLASMA GLUCOSE: 192 mg/dL

## 2016-02-01 NOTE — Progress Notes (Addendum)
[facility]                                                                              Patient Demographics  [name] [name], is a 82 y.o. male, DOB - [birth year], [id number]  Admit date - 01/28/2016   Admitting Physician Ivor Costa, MD  Outpatient Primary MD for the patient is Haywood Pao, MD  LOS - 4   Chief Complaint  Patient presents with  . Cough       Brief HPI   82 year old male with PMH of HTN, HLD, DM 2, chronic A. fib- Taken off Xarelto due to noncompliance and currently on aspirin, OSA-not on CPAP, stage III CKD, presented to [facility] ED on 01/28/16 with complaints of runny nose, productive cough and dyspnea. No fever, chills or sore throat. History of generalized weakness and several falls over the last few months including on day of admission. In ED, patient was found to have WBC 15.8, lactate 2.79, temperature 100.4, A. fib with RVR with heart rate 110-120, oxygen desaturation to 84%, worsening renal function, negative chest x-ray for acute abnormalities. He was admitted for sepsis due to pneumonia, acute respiratory failure with hypoxia and A. fib with RVR. CCM was consulted. Patient was admitted to stepdown. Clinically improved. Transferring to medical floor on 2/10.  Assessment & Plan    Principal Problem:   Acute respiratory failure with hypoxia (HCC) with acute purulent bronchitis, sepsis, still wheezing today - Initial and subsequent chest x-rays showed no focal infiltrate. Patient was treated per sepsis protocol, on IV vancomycin and Unasyn. - P CCM consulted and patient was transitioned to oral Augmentin on 2/10. - patient was transitioned to oral prednisone from IV Solu-Medrol, wheezing today on my exam. received breathing treatment.  - Clinically not in CHF, EF 55-60% per echo on 2/10, low index of PE - Lactate normalized, pro-calcitonin normal, sputum culture with normal flora, influenza panel negative, urine strep and Legionella antigen  negative, blood cultures negative to date.  Active problems Acute on Chronic atrial fibrillation with RVR:  - CHA2DS2-VASc Score is 4. Patient noncompliant and hence taken off of Xarelto, per spouse. -  Continue aspirin.  - RVR on admission likely precipitated by sepsis physiology and has improved.  - Continue metoprolol XL.  Essential hypertension: Mildly uncontrolled.  - Continue metoprolol, amlodipine  Acute on stage III chronic kidney disease: Likely worsened due to sepsis physiology, acute respiratory failure, dehydration - Baseline creatinine apparently in the 1.5-1.6 range. Creatinine on admission 1.95.  - Creatinine now improving, at baseline.   BPH: Continue Proscar and Myrbrtriq  Type II DM: A1c on 04/21/15:7.5. Not on medications at home. Currently on steroids. - Hemoglobin A1c on 2/88.3  - CBGs improving with tapering of steroids   Frequent falls:  - Likely multifactorial. CT head without acute findings.  - PT evaluation recommended skilled nursing facility however patient declines  Dementia: Mental status supposedly at baseline.   Code Status: Full CODE STATUS  Family Communication: Discussed in detail with the patient, all imaging results, lab results explained to the patient   Disposition Plan: Hopefully in a.m. to home with home  health  Time Spent in minutes   25 minutes  Procedures  echo  Consults   PCCM  DVT Prophylaxis  Lovenox   Medications  Scheduled Meds: . amLODipine  5 mg Oral Daily  . amoxicillin-clavulanate  1 tablet Oral BID  . antiseptic oral rinse  7 mL Mouth Rinse BID  . aspirin EC  325 mg Oral Daily  . donepezil  5 mg Oral Daily  . enoxaparin (LOVENOX) injection  30 mg Subcutaneous Q24H  . finasteride  5 mg Oral Daily  . guaiFENesin  1,200 mg Oral BID  . insulin aspart  0-5 Units Subcutaneous QHS  . insulin aspart  0-9 Units Subcutaneous TID WC  . ipratropium  0.5 mg Nebulization QID  . levalbuterol  0.63 mg Nebulization QID  .  metoprolol succinate  50 mg Oral QHS  . mirabegron ER  50 mg Oral Daily  . predniSONE  40 mg Oral Q breakfast   Continuous Infusions:  PRN Meds:.acetaminophen, levalbuterol   Antibiotics   Anti-infectives    Start     Dose/Rate Route Frequency Ordered Stop   01/31/16 2200  amoxicillin-clavulanate (AUGMENTIN) 875-125 MG per tablet 1 tablet     1 tablet Oral 2 times daily 01/31/16 1033 02/04/16 2159   01/31/16 0400  vancomycin (VANCOCIN) 1,250 mg in sodium chloride 0.9 % 250 mL IVPB  Status:  Discontinued     1,250 mg 166.7 mL/hr over 90 Minutes Intravenous Every 24 hours 01/30/16 0845 01/30/16 1500   01/30/16 1000  ampicillin-sulbactam (UNASYN) 1.5 g in sodium chloride 0.9 % 50 mL IVPB  Status:  Discontinued     1.5 g 100 mL/hr over 30 Minutes Intravenous Every 6 hours 01/30/16 0845 01/31/16 1033   01/30/16 0400  vancomycin (VANCOCIN) IVPB 1000 mg/200 mL premix  Status:  Discontinued     1,000 mg 200 mL/hr over 60 Minutes Intravenous Every 24 hours 01/29/16 0223 01/30/16 0845   01/29/16 1900  cefTRIAXone (ROCEPHIN) 1 g in dextrose 5 % 50 mL IVPB  Status:  Discontinued     1 g 100 mL/hr over 30 Minutes Intravenous Every 24 hours 01/28/16 1959 01/29/16 0205   01/29/16 1900  azithromycin (ZITHROMAX) 500 mg in dextrose 5 % 250 mL IVPB  Status:  Discontinued     500 mg 250 mL/hr over 60 Minutes Intravenous Every 24 hours 01/28/16 2000 01/29/16 0205   01/29/16 0300  vancomycin (VANCOCIN) 1,500 mg in sodium chloride 0.9 % 500 mL IVPB     1,500 mg 250 mL/hr over 120 Minutes Intravenous NOW 01/29/16 0223 01/29/16 0620   01/29/16 0300  ampicillin-sulbactam (UNASYN) 1.5 g in sodium chloride 0.9 % 50 mL IVPB  Status:  Discontinued     1.5 g 100 mL/hr over 30 Minutes Intravenous Every 12 hours 01/29/16 0223 01/30/16 0845   01/28/16 1945  cefTRIAXone (ROCEPHIN) 1 g in dextrose 5 % 50 mL IVPB  Status:  Discontinued     1 g 100 mL/hr over 30 Minutes Intravenous Every 24 hours 01/28/16 1933  01/28/16 1959   01/28/16 1945  azithromycin (ZITHROMAX) 500 mg in dextrose 5 % 250 mL IVPB  Status:  Discontinued     500 mg 250 mL/hr over 60 Minutes Intravenous Every 24 hours 01/28/16 1933 01/28/16 2000   01/28/16 1845  cefTRIAXone (ROCEPHIN) 1 g in dextrose 5 % 50 mL IVPB     1 g 100 mL/hr over 30 Minutes Intravenous  Once 01/28/16 1838 01/28/16 1935   01/28/16 1845  azithromycin (ZITHROMAX) 500 mg in dextrose 5 % 250 mL IVPB     500 mg 250 mL/hr over 60 Minutes Intravenous  Once 01/28/16 1838 01/28/16 2014        Subjective:   Bradford Petrosino was seen and examined today.  Wheezing today. Otherwise denied any worsening of shortness of breath this morning. Patient denies dizziness, chest pain, abdominal pain, N/V/D/C, new weakness, numbess, tingling. No acute events overnight.    Objective:   Blood pressure 155/80, pulse 66, temperature 98 F (36.7 C), temperature source Oral, resp. rate 19, height 5\' 7"  (1.702 m), weight 104.8 kg (231 lb 0.7 oz), SpO2 94 %.  Wt Readings from Last 3 Encounters:  02/01/16 104.8 kg (231 lb 0.7 oz)  10/16/15 100.699 kg (222 lb)  04/22/15 98.022 kg (216 lb 1.6 oz)     Intake/Output Summary (Last 24 hours) at 02/01/16 1245 Last data filed at 02/01/16 [id number]  Gross per 24 hour  Intake    920 ml  Output   2525 ml  Net  -1605 ml    Exam  General: Alert and oriented x 3, NAD  HEENT:  PERRLA, EOMI, Anicteric Sclera, mucous membranes moist.   Neck: Supple, no JVD, no masses  CVS: S1 S2 auscultated, no rubs, murmurs or gallops. Regular rate and rhythm.  Respiratory: Mild bilateral wheezing  Abdomen: Soft, nontender, nondistended, + bowel sounds  Ext: no cyanosis clubbing or edema  Neuro: AAOx3, Cr N's II- XII. Strength 5/5 upper and lower extremities bilaterally  Skin: No rashes  Psych: Normal affect and demeanor, alert and oriented x3    Data Review   Micro Results Recent Results (from the past 240 hour(s))  Blood culture  (routine x 2)     Status: None (Preliminary result)   Collection Time: 01/28/16  4:20 PM  Result Value Ref Range Status   Specimen Description BLOOD LEFT HAND  Final   Special Requests IN PEDIATRIC BOTTLE 4CC  Final   Culture NO GROWTH 4 DAYS  Final   Report Status PENDING  Incomplete  Blood culture (routine x 2)     Status: None (Preliminary result)   Collection Time: 01/28/16  6:15 PM  Result Value Ref Range Status   Specimen Description BLOOD RIGHT ANTECUBITAL  Final   Special Requests BOTTLES DRAWN AEROBIC AND ANAEROBIC 5CC  Final   Culture NO GROWTH 4 DAYS  Final   Report Status PENDING  Incomplete  Urine culture     Status: None   Collection Time: 01/28/16  9:50 PM  Result Value Ref Range Status   Specimen Description URINE, CATHETERIZED  Final   Special Requests NONE  Final   Culture NO GROWTH 1 DAY  Final   Report Status 01/30/2016 FINAL  Final  Culture, respiratory (NON-Expectorated)     Status: None   Collection Time: 01/28/16 10:19 PM  Result Value Ref Range Status   Specimen Description SPUTUM  Final   Special Requests NONE  Final   Gram Stain   Final    NO WBC SEEN FEW SQUAMOUS EPITHELIAL CELLS PRESENT NO ORGANISMS SEEN THIS SPECIMEN IS ACCEPTABLE FOR SPUTUM CULTURE Performed at [facility]    Culture   Final    NORMAL OROPHARYNGEAL FLORA Performed at [facility]    Report Status 01/31/2016 FINAL  Final  MRSA PCR Screening     Status: None   Collection Time: 01/29/16  1:24 PM  Result Value Ref Range Status   MRSA by PCR  NEGATIVE NEGATIVE Final    Comment:        The GeneXpert MRSA Assay (FDA approved for NASAL specimens only), is one component of a comprehensive MRSA colonization surveillance program. It is not intended to diagnose MRSA infection nor to guide or monitor treatment for MRSA infections.     Radiology Reports Dg Chest 2 View  01/29/2016  CLINICAL DATA:  Acute onset of shortness of breath, cough and congestion. Initial  encounter. EXAM: CHEST  2 VIEW COMPARISON:  Chest radiograph from 01/28/2016 FINDINGS: The lungs are well-aerated. Vascular congestion is noted. Mildly increased interstitial markings could reflect minimal interstitial edema. There is no evidence of pleural effusion or pneumothorax. The heart is normal in size; the mediastinal contour is within normal limits. No acute osseous abnormalities are seen. IMPRESSION: Vascular congestion noted. Mildly increased interstitial markings could reflect minimal interstitial edema. Electronically Signed   By: Garald Balding M.D.   On: 01/29/2016 02:43   Dg Chest 2 View  01/28/2016  CLINICAL DATA:  Productive cough for 2 days. Shortness of breath and weakness today. Initial encounter. EXAM: CHEST  2 VIEW COMPARISON:  PA and lateral chest 04/21/2015 and 12/28/2012. FINDINGS: The lungs are clear. Heart size is normal. There is no pneumothorax or pleural effusion. No focal bony abnormality is identified. IMPRESSION: No acute disease. Electronically Signed   By: Inge Rise M.D.   On: 01/28/2016 18:09   Ct Head Wo Contrast  01/28/2016  CLINICAL DATA:  Acute onset of cough. Worsening generalized weakness and recent falls. Initial encounter. EXAM: CT HEAD WITHOUT CONTRAST TECHNIQUE: Contiguous axial images were obtained from the base of the skull through the vertex without intravenous contrast. COMPARISON:  CT of the head performed 05/25/2013 FINDINGS: There is no evidence of acute infarction, mass lesion, or intra- or extra-axial hemorrhage on CT. Prominence of the ventricles and sulci reflects moderate cortical volume loss. Mild cerebellar atrophy is noted. Diffuse periventricular and subcortical white matter change likely reflects small vessel ischemic microangiopathy. Chronic ischemic change is noted at the basal ganglia bilaterally. The brainstem and fourth ventricle are within normal limits. The cerebral hemispheres demonstrate grossly normal gray-white differentiation. No  mass effect or midline shift is seen. There is no evidence of fracture; visualized osseous structures are unremarkable in appearance. The orbits are within normal limits. The paranasal sinuses and mastoid air cells are well-aerated. No significant soft tissue abnormalities are seen. IMPRESSION: 1. No acute intracranial pathology seen on CT. 2. Moderate cortical volume loss and diffuse small vessel ischemic microangiopathy. 3. Chronic ischemic change at the basal ganglia bilaterally. Electronically Signed   By: Garald Balding M.D.   On: 01/28/2016 23:24   Dg Chest Port 1 View  01/31/2016  CLINICAL DATA:  Bronchitis.  Hypertension. EXAM: PORTABLE CHEST 1 VIEW COMPARISON:  01/29/2016 FINDINGS: Atherosclerotic aortic arch. Heart size within normal limits for projection. Mild chronic interstitial accentuation of both lungs. No airspace opacity identified. Mild central airway thickening. IMPRESSION: 1. Airway thickening is present, suggesting bronchitis or reactive airways disease. 2. Mild chronic interstitial accentuation in both lungs. 3. Atherosclerosis. Electronically Signed   By: Van Clines M.D.   On: 01/31/2016 07:05    CBC  Recent Labs Lab 01/28/16 1758 01/30/16 0528 01/31/16 0510 02/01/16 0500  WBC 15.8* 32.0* 21.0* 17.3*  HGB 14.3 14.8 13.7 13.5  HCT 42.9 42.9 40.3 39.5  PLT 163 152 155 151  MCV 96.4 96.0 96.6 95.4  MCH 32.1 33.1 32.9 32.6  MCHC 33.3 34.5 34.0  34.2  RDW 13.5 13.9 13.9 13.6  LYMPHSABS 1.3  --   --   --   MONOABS 0.6  --   --   --   EOSABS 0.2  --   --   --   BASOSABS 0.0  --   --   --     Chemistries   Recent Labs Lab 01/28/16 1758 01/30/16 0528 01/31/16 0510 02/01/16 0500  NA 140 137 139 138  K 4.3 5.1 4.7 4.4  CL 100* 107 104 104  CO2 25 20* 25 23  GLUCOSE 174* 217* 172* 163*  BUN 21* 27* 35* 36*  CREATININE 1.95* 1.45* 1.76* 1.49*  CALCIUM 9.6 8.7* 8.5* 8.8*  AST 30  --   --   --   ALT 26  --   --   --   ALKPHOS 75  --   --   --   BILITOT  0.9  --   --   --    ------------------------------------------------------------------------------------------------------------------ estimated creatinine clearance is 39.6 mL/min (by C-G formula based on Cr of 1.49). ------------------------------------------------------------------------------------------------------------------ No results for input(s): HGBA1C in the last 72 hours. ------------------------------------------------------------------------------------------------------------------ No results for input(s): CHOL, HDL, LDLCALC, TRIG, CHOLHDL, LDLDIRECT in the last 72 hours. ------------------------------------------------------------------------------------------------------------------ No results for input(s): TSH, T4TOTAL, T3FREE, THYROIDAB in the last 72 hours.  Invalid input(s): FREET3 ------------------------------------------------------------------------------------------------------------------ No results for input(s): VITAMINB12, FOLATE, FERRITIN, TIBC, IRON, RETICCTPCT in the last 72 hours.  Coagulation profile  Recent Labs Lab 01/28/16 2040  INR 1.23    No results for input(s): DDIMER in the last 72 hours.  Cardiac Enzymes  Recent Labs Lab 01/28/16 2040 01/29/16 0342 01/29/16 0844  TROPONINI <0.03 0.03 <0.03   ------------------------------------------------------------------------------------------------------------------ Invalid input(s): POCBNP   Recent Labs  01/31/16 0755 01/31/16 1213 01/31/16 1656 01/31/16 2115 02/01/16 0737 02/01/16 1156  GLUCAP 181* 230* 292* 307* 187* 210*     Emmalene Kattner M.D. [facility] 02/01/2016, 12:45 PM  Pager: [phone] Between 7am to 7pm - call Pager - [phone]  After 7pm go to [email or web address] - password [email or web address]  Call night coverage person covering after 7pm

## 2016-02-01 NOTE — Progress Notes (Signed)
SATURATION QUALIFICATIONS: (This note is used to comply with regulatory documentation for home oxygen)  Patient Saturations on Room Air at Rest = 94%  Patient Saturations on Room Air while Ambulating = 88%  Patient Saturations on 2 Liters of oxygen while Ambulating = 92%  Please briefly explain why patient needs home oxygen: 

## 2016-02-01 NOTE — Progress Notes (Signed)
Chest Vest not done for 0000 time, RT covering Pediatrics with multiple new orders, RT to monitor.

## 2016-02-02 LAB — CULTURE, BLOOD (ROUTINE X 2)
CULTURE: NO GROWTH
Culture: NO GROWTH

## 2016-02-02 LAB — GLUCOSE, CAPILLARY
Glucose-Capillary: 138 mg/dL — ABNORMAL HIGH (ref 65–99)
Glucose-Capillary: 193 mg/dL — ABNORMAL HIGH (ref 65–99)

## 2016-02-02 MED ORDER — GUAIFENESIN ER 600 MG PO TB12: 1200.0000 mg | ORAL_TABLET | Freq: Two times a day (BID) | ORAL | Status: DC

## 2016-02-02 MED ORDER — AMLODIPINE BESYLATE 5 MG PO TABS: 5.0000 mg | ORAL_TABLET | Freq: Every day | ORAL | Status: DC

## 2016-02-02 MED ORDER — AMOXICILLIN-POT CLAVULANATE 875-125 MG PO TABS: 1.0000 | ORAL_TABLET | Freq: Two times a day (BID) | ORAL | Status: DC

## 2016-02-02 MED ORDER — PREDNISONE 10 MG PO TABS: ORAL_TABLET | ORAL | Status: DC

## 2016-02-02 NOTE — Progress Notes (Signed)
0400 not done, RT receiving new admission in PICU

## 2016-02-02 NOTE — Discharge Summary (Addendum)
Physician Discharge Summary  [name] [name]  [id number]  DOB: [birth year]  DOA: 01/28/2016  PCP: Haywood Pao, MD  Primary cardiologist: Dr. Sherren Mocha  Admit date: 01/28/2016 Discharge date: 02/02/2016  Time spent: Greater than 30 minutes  Recommendations for Outpatient Follow-up:  1. Dr. Domenick Gong, PCP in 5 days with repeat labs (CBC & BMP). 2. Home health PT, OT, RN & Aide. Patient declined SNF. 3. Home oxygen at 2 L/m continuously but especially with activity. This can be titrated off during outpatient follow-up, as deemed appropriate.  Discharge Diagnoses:  Principal Problem:   Acute respiratory failure with hypoxia (HCC) Active Problems:   Hyperlipidemia   HYPERTENSION, BENIGN   Atrial fibrillation with RVR (HCC)   Fall   Acute renal failure superimposed on stage 3 chronic kidney disease (HCC)   BPH (benign prostatic hypertrophy)   Diabetes mellitus without complication (HCC)   Elevated lactic acid level   Sepsis ([place])   Discharge Condition: Improved & Stable  Diet recommendation: Heart healthy diet.  Filed Weights   01/30/16 0400 01/31/16 0413 02/01/16 1000  Weight: 104.8 kg (231 lb 0.7 oz) 104.8 kg (231 lb 0.7 oz) 104.8 kg (231 lb 0.7 oz)    History of present illness:  82 year old male with PMH of HTN, HLD, DM 2, chronic A. fib- Taken off Xarelto due to noncompliance and currently on aspirin, OSA-not on CPAP, stage III CKD, presented to [facility] ED on 01/28/16 with complaints of runny nose, productive cough and dyspnea. No fever, chills or sore throat. History of generalized weakness and several falls over the last few months including on day of admission. In ED, patient was found to have WBC 15.8, lactate 2.79, temperature 100.4, A. fib with RVR with heart rate 110-120, oxygen desaturation to 84%, worsening renal function, negative chest x-ray for acute abnormalities. He was admitted for sepsis due to pneumonia, acute respiratory failure with hypoxia and A.  fib with RVR. CCM was consulted. Patient was admitted to stepdown. Clinically improved.   Hospital Course:   Assessment/Plan:  1. Acute purulent bronchitis: Initial and subsequent chest x-rays did not reveal focal infiltrate. Treated per sepsis protocol. Antibiotics were broadened to vancomycin and Unasyn. CCM input appreciated. DC'ed vancomycin & Unasyn. Changed to oral Augmentin on 1/10 to complete total 7 days treatment. Chest PT ordered. Changed IV Solu-Medrol to oral prednisone-recommend quick taper. Blood cultures 2: Negative. Clinically improved. 2. Sepsis: Present on admission. Secondary to problem #1. Treated per sepsis protocol with aggressive IV fluid resuscitation and broad-spectrum IV antibiotics. Sepsis physiology resolved. MRSA PCR: Negative. Lactate has finally normalized. Pro-calcitonin 0.12 > 0.19. Sputum culture: Normal OP flora. Influenza panel PCR: Negative. Urine culture: Neg. Urine microscopy: Not indicative of UTI. Urinary strep and Legionella antigen: Negative. Blood cultures 2: Negative. 3. Acute respiratory failure with hypoxia: Likely secondary to problem #1. Management as above. Titrate/wean oxygen as tolerated. Chest physical therapy. Incentive spirometry. Low index of suspicion for PE. Clinically not felt to be in CHF. 2-D echo results as below. Requires home oxygen at discharge. 4. Chronic atrial fibrillation with RVR: CHA2DS2-VASc Score is 4. Patient noncompliant and hence taken off of Xarelto, per spouse. Continue aspirin. RVR on admission likely precipitated by sepsis physiology and has improved. Continue metoprolol XL. Was not started on Cardizem drip. Controlled ventricular rate. EKG 2/10: A. fib with ventricular rate of 81, low voltage, normal axis, no acute changes, QTC 471 ms. 5. Essential hypertension: Mildly uncontrolled. Continue metoprolol. Added low-dose amlodipine 5 MG daily.  Better controlled. 6. Acute on stage III chronic kidney disease: Baseline  creatinine apparently in the 1.5-1.6 range. Creatinine on admission 1.95. May be prerenal due to dehydration from sepsis. IV fluids. Avoid nephrotoxic medications. Creatinine 1.4 and at baseline. Periodically follow BMP as outpatient. 7. Hyperlipidemia: Not on medications at home. 8. BPH: Continue Proscar and Myrbrtriq 9. Type II DM: A1c on 04/21/15:7.5. Not on medications at home. Currently on steroids.Mildly uncontrolled. Reduce steroids as soon as possible. Expected to improve with weaning off of steroids. 10. Frequent falls: Likely multifactorial. CT head without acute findings. Patient has repeatedly declined SNF. We will go home on home health services and wife's supervision. 11. Dementia: Mental status supposedly at baseline. 12. Leukocytosis: Likely steroid effect. Follow CBCs periodically. Improving.     Family Communication: Discussed extensively with spouse at bedside today. Updated care and answered questions..    Consultants:  CCM  Procedures:  2-D echo 01/31/16: Study Conclusions  - Procedure narrative: Transthoracic echocardiography. Image quality was poor. The study was technically difficult, as a result of poor acoustic windows, poor sound wave transmission, restricted patient mobility, and body habitus. - Left ventricle: The cavity size was normal. There was mild focal basal hypertrophy of the septum. Systolic function was normal. The estimated ejection fraction was in the range of 55% to 60%. Wall motion was normal; there were no regional wall motion abnormalities. - Aortic valve: There was mild regurgitation. - Mitral valve: There was mild to moderate regurgitation directed centrally. - Pulmonary arteries: PA peak pressure: 33 mm Hg (S).   Antimicrobials:  IV azithromycin 1 dose  IV Rocephin 1 dose  IV Unasyn 2/7 > 2/10  IV vancomycin 2/7 > 2/9   Oral Augmentin 2/10 > 2/13   Discharge Exam:  Complaints: Denies complaints. No  dyspnea reported. Cough continues to improve. As per spouse at bedside, no acute issues. Patient continues to decline SNF.  Filed Vitals:   02/01/16 1609 02/01/16 2033 02/01/16 2200 02/02/16 0833  BP:  142/90    Pulse:  100    Temp:  98 F (36.7 C)    TempSrc:  Oral    Resp:  19    Height:      Weight:      SpO2: 92% 95% 95% 97%    General exam: Moderately built and overweight pleasant elderly male lying comfortably propped up in bed. Respiratory system: Clear to auscultation. No increased work of breathing. Cardiovascular system: S1 & S2 heard, irregularly irregular. No JVD, murmurs, gallops, clicks or pedal edema. Gastrointestinal system: Abdomen is nondistended, soft and nontender. Normal bowel sounds heard. Central nervous system: Alert and oriented 2. No focal neurological deficits. Extremities: Symmetric 5 x 5 power.  Discharge Instructions      Discharge Instructions    Call MD for:  difficulty breathing, headache or visual disturbances    Complete by:  As directed      Call MD for:  extreme fatigue    Complete by:  As directed      Call MD for:  persistant dizziness or light-headedness    Complete by:  As directed      Call MD for:  persistant nausea and vomiting    Complete by:  As directed      Call MD for:  severe uncontrolled pain    Complete by:  As directed      Call MD for:  temperature >100.4    Complete by:  As directed  Diet - low sodium heart healthy    Complete by:  As directed      Discharge instructions    Complete by:  As directed   Oxygen via nasal cannula at 2 L/m continuously.     Increase activity slowly    Complete by:  As directed             Medication List    TAKE these medications        amLODipine 5 MG tablet  Commonly known as:  NORVASC  Take 1 tablet (5 mg total) by mouth daily.     amoxicillin-clavulanate 875-125 MG tablet  Commonly known as:  AUGMENTIN  Take 1 tablet by mouth 2 (two) times daily.     aspirin EC 325  MG tablet  Take 1 tablet (325 mg total) by mouth daily.     donepezil 5 MG tablet  Commonly known as:  ARICEPT  Take 5 mg by mouth daily.     finasteride 5 MG tablet  Commonly known as:  PROSCAR  Take 5 mg by mouth daily.     guaiFENesin 600 MG 12 hr tablet  Commonly known as:  MUCINEX  Take 2 tablets (1,200 mg total) by mouth 2 (two) times daily.     levalbuterol 0.63 MG/3ML nebulizer solution  Commonly known as:  XOPENEX  Take 3 mLs (0.63 mg total) by nebulization every 6 (six) hours as needed for wheezing or shortness of breath.     metoprolol succinate 50 MG 24 hr tablet  Commonly known as:  TOPROL-XL  Take 1 tablet (50 mg total) by mouth at bedtime.     MYRBETRIQ 50 MG Tb24 tablet  Generic drug:  mirabegron ER  Take 50 mg by mouth daily.     predniSONE 10 MG tablet  Commonly known as:  DELTASONE  Take 3 tabs daily for 2 days, then 2 tabs daily for 2 days, then 1 tab daily for 2 days, then stop.  Start taking on:  02/03/2016       Follow-up Information    Follow up with [facility].   Why:  home oxygen   Contact information:   [address] [place] [address] [phone]       Follow up with Haywood Pao, MD. Schedule an appointment as soon as possible for a visit in 5 days.   Specialty:  Internal Medicine   Why:  To be seen with repeat labs (CBC & BMP).   Contact information:   [address] [place] [address] [phone]       Get Medicines reviewed and adjusted: Please take all your medications with you for your next visit with your Primary MD  Please request your Primary MD to go over all hospital tests and procedure/radiological results at the follow up. Please ask your Primary MD to get all Hospital records sent to his/her office.  If you experience worsening of your admission symptoms, develop shortness of breath, life threatening emergency, suicidal or homicidal thoughts you must seek medical attention  immediately by calling 911 or calling your MD immediately if symptoms less severe.  You must read complete instructions/literature along with all the possible adverse reactions/side effects for all the Medicines you take and that have been prescribed to you. Take any new Medicines after you have completely understood and accept all the possible adverse reactions/side effects.   Do not drive when taking pain medications.   Do not take  more than prescribed Pain, Sleep and Anxiety Medications  Special Instructions: If you have smoked or chewed Tobacco in the last 2 yrs please stop smoking, stop any regular Alcohol and or any Recreational drug use.  Wear Seat belts while driving.  Please note  You were cared for by a hospitalist during your hospital stay. Once you are discharged, your primary care physician will handle any further medical issues. Please note that NO REFILLS for any discharge medications will be authorized once you are discharged, as it is imperative that you return to your primary care physician (or establish a relationship with a primary care physician if you do not have one) for your aftercare needs so that they can reassess your need for medications and monitor your lab values.    The results of significant diagnostics from this hospitalization (including imaging, microbiology, ancillary and laboratory) are listed below for reference.    Significant Diagnostic Studies: Dg Chest 2 View  01/29/2016  CLINICAL DATA:  Acute onset of shortness of breath, cough and congestion. Initial encounter. EXAM: CHEST  2 VIEW COMPARISON:  Chest radiograph from 01/28/2016 FINDINGS: The lungs are well-aerated. Vascular congestion is noted. Mildly increased interstitial markings could reflect minimal interstitial edema. There is no evidence of pleural effusion or pneumothorax. The heart is normal in size; the mediastinal contour is within normal limits. No acute osseous abnormalities are seen.  IMPRESSION: Vascular congestion noted. Mildly increased interstitial markings could reflect minimal interstitial edema. Electronically Signed   By: Garald Balding M.D.   On: 01/29/2016 02:43   Dg Chest 2 View  01/28/2016  CLINICAL DATA:  Productive cough for 2 days. Shortness of breath and weakness today. Initial encounter. EXAM: CHEST  2 VIEW COMPARISON:  PA and lateral chest 04/21/2015 and 12/28/2012. FINDINGS: The lungs are clear. Heart size is normal. There is no pneumothorax or pleural effusion. No focal bony abnormality is identified. IMPRESSION: No acute disease. Electronically Signed   By: Inge Rise M.D.   On: 01/28/2016 18:09   Ct Head Wo Contrast  01/28/2016  CLINICAL DATA:  Acute onset of cough. Worsening generalized weakness and recent falls. Initial encounter. EXAM: CT HEAD WITHOUT CONTRAST TECHNIQUE: Contiguous axial images were obtained from the base of the skull through the vertex without intravenous contrast. COMPARISON:  CT of the head performed 05/25/2013 FINDINGS: There is no evidence of acute infarction, mass lesion, or intra- or extra-axial hemorrhage on CT. Prominence of the ventricles and sulci reflects moderate cortical volume loss. Mild cerebellar atrophy is noted. Diffuse periventricular and subcortical white matter change likely reflects small vessel ischemic microangiopathy. Chronic ischemic change is noted at the basal ganglia bilaterally. The brainstem and fourth ventricle are within normal limits. The cerebral hemispheres demonstrate grossly normal gray-white differentiation. No mass effect or midline shift is seen. There is no evidence of fracture; visualized osseous structures are unremarkable in appearance. The orbits are within normal limits. The paranasal sinuses and mastoid air cells are well-aerated. No significant soft tissue abnormalities are seen. IMPRESSION: 1. No acute intracranial pathology seen on CT. 2. Moderate cortical volume loss and diffuse small vessel  ischemic microangiopathy. 3. Chronic ischemic change at the basal ganglia bilaterally. Electronically Signed   By: Garald Balding M.D.   On: 01/28/2016 23:24   Dg Chest Port 1 View  01/31/2016  CLINICAL DATA:  Bronchitis.  Hypertension. EXAM: PORTABLE CHEST 1 VIEW COMPARISON:  01/29/2016 FINDINGS: Atherosclerotic aortic arch. Heart size within normal limits for projection. Mild chronic interstitial accentuation of  both lungs. No airspace opacity identified. Mild central airway thickening. IMPRESSION: 1. Airway thickening is present, suggesting bronchitis or reactive airways disease. 2. Mild chronic interstitial accentuation in both lungs. 3. Atherosclerosis. Electronically Signed   By: Van Clines M.D.   On: 01/31/2016 07:05    Microbiology: Recent Results (from the past 240 hour(s))  Blood culture (routine x 2)     Status: None   Collection Time: 01/28/16  4:20 PM  Result Value Ref Range Status   Specimen Description BLOOD LEFT HAND  Final   Special Requests IN PEDIATRIC BOTTLE 4CC  Final   Culture NO GROWTH 5 DAYS  Final   Report Status 02/02/2016 FINAL  Final  Blood culture (routine x 2)     Status: None   Collection Time: 01/28/16  6:15 PM  Result Value Ref Range Status   Specimen Description BLOOD RIGHT ANTECUBITAL  Final   Special Requests BOTTLES DRAWN AEROBIC AND ANAEROBIC 5CC  Final   Culture NO GROWTH 5 DAYS  Final   Report Status 02/02/2016 FINAL  Final  Urine culture     Status: None   Collection Time: 01/28/16  9:50 PM  Result Value Ref Range Status   Specimen Description URINE, CATHETERIZED  Final   Special Requests NONE  Final   Culture NO GROWTH 1 DAY  Final   Report Status 01/30/2016 FINAL  Final  Culture, respiratory (NON-Expectorated)     Status: None   Collection Time: 01/28/16 10:19 PM  Result Value Ref Range Status   Specimen Description SPUTUM  Final   Special Requests NONE  Final   Gram Stain   Final    NO WBC SEEN FEW SQUAMOUS EPITHELIAL CELLS  PRESENT NO ORGANISMS SEEN THIS SPECIMEN IS ACCEPTABLE FOR SPUTUM CULTURE Performed at [facility]    Culture   Final    NORMAL OROPHARYNGEAL FLORA Performed at [facility]    Report Status 01/31/2016 FINAL  Final  MRSA PCR Screening     Status: None   Collection Time: 01/29/16  1:24 PM  Result Value Ref Range Status   MRSA by PCR NEGATIVE NEGATIVE Final    Comment:        The GeneXpert MRSA Assay (FDA approved for NASAL specimens only), is one component of a comprehensive MRSA colonization surveillance program. It is not intended to diagnose MRSA infection nor to guide or monitor treatment for MRSA infections.      Labs: Basic Metabolic Panel:  Recent Labs Lab 01/28/16 1758 01/30/16 0528 01/31/16 0510 02/01/16 0500  NA 140 137 139 138  K 4.3 5.1 4.7 4.4  CL 100* 107 104 104  CO2 25 20* 25 23  GLUCOSE 174* 217* 172* 163*  BUN 21* 27* 35* 36*  CREATININE 1.95* 1.45* 1.76* 1.49*  CALCIUM 9.6 8.7* 8.5* 8.8*   Liver Function Tests:  Recent Labs Lab 01/28/16 1758  AST 30  ALT 26  ALKPHOS 75  BILITOT 0.9  PROT 8.0  ALBUMIN 4.0   No results for input(s): LIPASE, AMYLASE in the last 168 hours. No results for input(s): AMMONIA in the last 168 hours. CBC:  Recent Labs Lab 01/28/16 1758 01/30/16 0528 01/31/16 0510 02/01/16 0500  WBC 15.8* 32.0* 21.0* 17.3*  NEUTROABS 13.7*  --   --   --   HGB 14.3 14.8 13.7 13.5  HCT 42.9 42.9 40.3 39.5  MCV 96.4 96.0 96.6 95.4  PLT 163 152 155 151   Cardiac Enzymes:  Recent Labs Lab  01/28/16 2040 01/29/16 0342 01/29/16 0844  TROPONINI <0.03 0.03 <0.03   BNP: BNP (last 3 results)  Recent Labs  04/21/15 0524  BNP 83.1    ProBNP (last 3 results) No results for input(s): PROBNP in the last 8760 hours.  CBG:  Recent Labs Lab 02/01/16 1156 02/01/16 1712 02/01/16 2200 02/02/16 0818 02/02/16 1204  GLUCAP 210* 284* 215* 138* 193*       Signed:  Vernell Leep, MD, FACP,  FHM. [facility] Pager [phone]  If 7PM-7AM, please contact night-coverage [email or web address] Password [facility] 02/02/2016, 12:30 PM

## 2016-02-02 NOTE — Progress Notes (Signed)
CM notified by MD to please arrange for home O2.  CM spoke with pt and spouse who are set up with [name] for [facility] services.  CM called [facility] rep, Tiffany to arrange for home.  CM called [facility] DME rep, Merry Proud to please deliver O2 tank to room prior to discharge.  No other CM needs were communicated.

## 2016-02-02 NOTE — Discharge Instructions (Signed)

## 2016-02-02 NOTE — Progress Notes (Signed)
Pt discharged to home accomp by wife.  Son to assist them at home with getting out of the car.  Oxygen sent home with pt and wife understands how to use.  [facility] to follow up as well as [name] for PT/OT, aide.  Rxs called into [facility] on [address].  Wife understands to go and get them and also about prednisone taper.  DC instructions given and reviewed.  Copy given.  Follow up appt Dr. Osborne Casco, wife understands to call for appt.

## 2016-02-03 DIAGNOSIS — I129 Hypertensive chronic kidney disease with stage 1 through stage 4 chronic kidney disease, or unspecified chronic kidney disease: Secondary | ICD-10-CM | POA: Diagnosis not present

## 2016-02-03 DIAGNOSIS — E119 Type 2 diabetes mellitus without complications: Secondary | ICD-10-CM | POA: Diagnosis not present

## 2016-02-03 DIAGNOSIS — J209 Acute bronchitis, unspecified: Secondary | ICD-10-CM | POA: Diagnosis not present

## 2016-02-03 DIAGNOSIS — I482 Chronic atrial fibrillation: Secondary | ICD-10-CM | POA: Diagnosis not present

## 2016-02-03 DIAGNOSIS — J189 Pneumonia, unspecified organism: Secondary | ICD-10-CM | POA: Diagnosis not present

## 2016-02-03 DIAGNOSIS — F0390 Unspecified dementia without behavioral disturbance: Secondary | ICD-10-CM | POA: Diagnosis not present

## 2016-02-11 DIAGNOSIS — J159 Unspecified bacterial pneumonia: Secondary | ICD-10-CM | POA: Diagnosis not present

## 2016-02-11 DIAGNOSIS — R05 Cough: Secondary | ICD-10-CM | POA: Diagnosis not present

## 2016-02-11 DIAGNOSIS — E1129 Type 2 diabetes mellitus with other diabetic kidney complication: Secondary | ICD-10-CM | POA: Diagnosis not present

## 2016-02-11 DIAGNOSIS — A419 Sepsis, unspecified organism: Secondary | ICD-10-CM | POA: Diagnosis not present

## 2016-02-11 DIAGNOSIS — E668 Other obesity: Secondary | ICD-10-CM | POA: Diagnosis not present

## 2016-02-11 DIAGNOSIS — Z6836 Body mass index (BMI) 36.0-36.9, adult: Secondary | ICD-10-CM | POA: Diagnosis not present

## 2016-02-11 DIAGNOSIS — F0390 Unspecified dementia without behavioral disturbance: Secondary | ICD-10-CM | POA: Diagnosis not present

## 2016-02-11 DIAGNOSIS — I480 Paroxysmal atrial fibrillation: Secondary | ICD-10-CM | POA: Diagnosis not present

## 2016-02-11 DIAGNOSIS — M6289 Other specified disorders of muscle: Secondary | ICD-10-CM | POA: Diagnosis not present

## 2016-02-26 ENCOUNTER — Encounter (HOSPITAL_COMMUNITY): Payer: Self-pay | Admitting: Emergency Medicine

## 2016-02-26 ENCOUNTER — Inpatient Hospital Stay (HOSPITAL_COMMUNITY)
Admission: EM | Admit: 2016-02-26 | Discharge: 2016-03-02 | DRG: 871 | Disposition: A | Discharge status: Skilled Nursing Facility | Payer: Medicare Other | Attending: Internal Medicine | Admitting: Internal Medicine

## 2016-02-26 ENCOUNTER — Emergency Department (HOSPITAL_COMMUNITY): Payer: Medicare Other

## 2016-02-26 DIAGNOSIS — R0600 Dyspnea, unspecified: Secondary | ICD-10-CM | POA: Diagnosis present

## 2016-02-26 DIAGNOSIS — R41841 Cognitive communication deficit: Secondary | ICD-10-CM | POA: Diagnosis not present

## 2016-02-26 DIAGNOSIS — R05 Cough: Secondary | ICD-10-CM | POA: Diagnosis not present

## 2016-02-26 DIAGNOSIS — E1122 Type 2 diabetes mellitus with diabetic chronic kidney disease: Secondary | ICD-10-CM | POA: Diagnosis not present

## 2016-02-26 DIAGNOSIS — A419 Sepsis, unspecified organism: Secondary | ICD-10-CM | POA: Diagnosis not present

## 2016-02-26 DIAGNOSIS — R739 Hyperglycemia, unspecified: Secondary | ICD-10-CM | POA: Diagnosis present

## 2016-02-26 DIAGNOSIS — G4733 Obstructive sleep apnea (adult) (pediatric): Secondary | ICD-10-CM | POA: Diagnosis present

## 2016-02-26 DIAGNOSIS — R0602 Shortness of breath: Secondary | ICD-10-CM | POA: Diagnosis not present

## 2016-02-26 DIAGNOSIS — I5033 Acute on chronic diastolic (congestive) heart failure: Secondary | ICD-10-CM | POA: Diagnosis not present

## 2016-02-26 DIAGNOSIS — E669 Obesity, unspecified: Secondary | ICD-10-CM | POA: Diagnosis present

## 2016-02-26 DIAGNOSIS — J9811 Atelectasis: Secondary | ICD-10-CM | POA: Diagnosis not present

## 2016-02-26 DIAGNOSIS — Z8701 Personal history of pneumonia (recurrent): Secondary | ICD-10-CM | POA: Diagnosis not present

## 2016-02-26 DIAGNOSIS — J9621 Acute and chronic respiratory failure with hypoxia: Secondary | ICD-10-CM | POA: Diagnosis present

## 2016-02-26 DIAGNOSIS — W07XXXA Fall from chair, initial encounter: Secondary | ICD-10-CM | POA: Diagnosis present

## 2016-02-26 DIAGNOSIS — J9601 Acute respiratory failure with hypoxia: Secondary | ICD-10-CM | POA: Diagnosis present

## 2016-02-26 DIAGNOSIS — Z882 Allergy status to sulfonamides status: Secondary | ICD-10-CM | POA: Diagnosis not present

## 2016-02-26 DIAGNOSIS — J189 Pneumonia, unspecified organism: Secondary | ICD-10-CM | POA: Diagnosis not present

## 2016-02-26 DIAGNOSIS — Z6834 Body mass index (BMI) 34.0-34.9, adult: Secondary | ICD-10-CM | POA: Diagnosis not present

## 2016-02-26 DIAGNOSIS — I482 Chronic atrial fibrillation, unspecified: Secondary | ICD-10-CM | POA: Diagnosis present

## 2016-02-26 DIAGNOSIS — Z7982 Long term (current) use of aspirin: Secondary | ICD-10-CM

## 2016-02-26 DIAGNOSIS — Z87891 Personal history of nicotine dependence: Secondary | ICD-10-CM

## 2016-02-26 DIAGNOSIS — J40 Bronchitis, not specified as acute or chronic: Secondary | ICD-10-CM | POA: Diagnosis not present

## 2016-02-26 DIAGNOSIS — R339 Retention of urine, unspecified: Secondary | ICD-10-CM | POA: Diagnosis present

## 2016-02-26 DIAGNOSIS — I872 Venous insufficiency (chronic) (peripheral): Secondary | ICD-10-CM | POA: Diagnosis present

## 2016-02-26 DIAGNOSIS — E119 Type 2 diabetes mellitus without complications: Secondary | ICD-10-CM

## 2016-02-26 DIAGNOSIS — N183 Chronic kidney disease, stage 3 unspecified: Secondary | ICD-10-CM | POA: Insufficient documentation

## 2016-02-26 DIAGNOSIS — J9611 Chronic respiratory failure with hypoxia: Secondary | ICD-10-CM | POA: Diagnosis not present

## 2016-02-26 DIAGNOSIS — E876 Hypokalemia: Secondary | ICD-10-CM | POA: Diagnosis not present

## 2016-02-26 DIAGNOSIS — Z9119 Patient's noncompliance with other medical treatment and regimen: Secondary | ICD-10-CM | POA: Diagnosis not present

## 2016-02-26 DIAGNOSIS — F0390 Unspecified dementia without behavioral disturbance: Secondary | ICD-10-CM | POA: Diagnosis present

## 2016-02-26 DIAGNOSIS — Z9981 Dependence on supplemental oxygen: Secondary | ICD-10-CM

## 2016-02-26 DIAGNOSIS — E785 Hyperlipidemia, unspecified: Secondary | ICD-10-CM | POA: Diagnosis present

## 2016-02-26 DIAGNOSIS — I4891 Unspecified atrial fibrillation: Secondary | ICD-10-CM

## 2016-02-26 DIAGNOSIS — R609 Edema, unspecified: Secondary | ICD-10-CM

## 2016-02-26 DIAGNOSIS — R2681 Unsteadiness on feet: Secondary | ICD-10-CM | POA: Diagnosis not present

## 2016-02-26 DIAGNOSIS — I130 Hypertensive heart and chronic kidney disease with heart failure and stage 1 through stage 4 chronic kidney disease, or unspecified chronic kidney disease: Secondary | ICD-10-CM | POA: Diagnosis present

## 2016-02-26 DIAGNOSIS — E1165 Type 2 diabetes mellitus with hyperglycemia: Secondary | ICD-10-CM | POA: Diagnosis present

## 2016-02-26 DIAGNOSIS — Y95 Nosocomial condition: Secondary | ICD-10-CM | POA: Diagnosis present

## 2016-02-26 DIAGNOSIS — N179 Acute kidney failure, unspecified: Secondary | ICD-10-CM | POA: Diagnosis not present

## 2016-02-26 DIAGNOSIS — R278 Other lack of coordination: Secondary | ICD-10-CM | POA: Diagnosis not present

## 2016-02-26 DIAGNOSIS — I214 Non-ST elevation (NSTEMI) myocardial infarction: Secondary | ICD-10-CM

## 2016-02-26 DIAGNOSIS — R778 Other specified abnormalities of plasma proteins: Secondary | ICD-10-CM

## 2016-02-26 DIAGNOSIS — N400 Enlarged prostate without lower urinary tract symptoms: Secondary | ICD-10-CM | POA: Diagnosis not present

## 2016-02-26 DIAGNOSIS — I10 Essential (primary) hypertension: Secondary | ICD-10-CM | POA: Diagnosis present

## 2016-02-26 DIAGNOSIS — W19XXXA Unspecified fall, initial encounter: Secondary | ICD-10-CM | POA: Diagnosis present

## 2016-02-26 DIAGNOSIS — R7989 Other specified abnormal findings of blood chemistry: Secondary | ICD-10-CM

## 2016-02-26 DIAGNOSIS — I878 Other specified disorders of veins: Secondary | ICD-10-CM | POA: Diagnosis present

## 2016-02-26 DIAGNOSIS — M6281 Muscle weakness (generalized): Secondary | ICD-10-CM | POA: Diagnosis not present

## 2016-02-26 DIAGNOSIS — Z792 Long term (current) use of antibiotics: Secondary | ICD-10-CM

## 2016-02-26 DIAGNOSIS — M7989 Other specified soft tissue disorders: Secondary | ICD-10-CM | POA: Diagnosis not present

## 2016-02-26 HISTORY — DX: Pneumonia, unspecified organism: J18.9

## 2016-02-26 HISTORY — DX: Other specified abnormal findings of blood chemistry: R79.89

## 2016-02-26 HISTORY — DX: Other specified abnormalities of plasma proteins: R77.8

## 2016-02-26 LAB — CBC WITH DIFFERENTIAL/PLATELET
BASOS ABS: 0 10*3/uL (ref 0.0–0.1)
BASOS PCT: 0 %
EOS ABS: 0.1 10*3/uL (ref 0.0–0.7)
EOS PCT: 1 %
HCT: 39.6 % (ref 39.0–52.0)
Hemoglobin: 13.5 g/dL (ref 13.0–17.0)
Lymphocytes Relative: 11 %
Lymphs Abs: 1.7 10*3/uL (ref 0.7–4.0)
MCH: 32.7 pg (ref 26.0–34.0)
MCHC: 34.1 g/dL (ref 30.0–36.0)
MCV: 95.9 fL (ref 78.0–100.0)
MONO ABS: 1 10*3/uL (ref 0.1–1.0)
Monocytes Relative: 7 %
NEUTROS ABS: 11.8 10*3/uL — AB (ref 1.7–7.7)
Neutrophils Relative %: 81 %
PLATELETS: 220 10*3/uL (ref 150–400)
RBC: 4.13 MIL/uL — ABNORMAL LOW (ref 4.22–5.81)
RDW: 13.4 % (ref 11.5–15.5)
WBC: 14.7 10*3/uL — ABNORMAL HIGH (ref 4.0–10.5)

## 2016-02-26 LAB — I-STAT CG4 LACTIC ACID, ED: LACTIC ACID, VENOUS: 1.23 mmol/L (ref 0.5–2.0)

## 2016-02-26 LAB — BASIC METABOLIC PANEL
ANION GAP: 11 (ref 5–15)
BUN: 17 mg/dL (ref 6–20)
CALCIUM: 8.9 mg/dL (ref 8.9–10.3)
CO2: 24 mmol/L (ref 22–32)
CREATININE: 1.54 mg/dL — AB (ref 0.61–1.24)
Chloride: 99 mmol/L — ABNORMAL LOW (ref 101–111)
GFR, EST AFRICAN AMERICAN: 45 mL/min — AB (ref >60)
GFR, EST NON AFRICAN AMERICAN: 39 mL/min — AB (ref >60)
Glucose, Bld: 186 mg/dL — ABNORMAL HIGH (ref 65–99)
Potassium: 4 mmol/L (ref 3.5–5.1)
SODIUM: 134 mmol/L — AB (ref 135–145)

## 2016-02-26 LAB — CREATININE, SERUM
Creatinine, Ser: 1.66 mg/dL — ABNORMAL HIGH (ref 0.61–1.24)
GFR calc Af Amer: 41 mL/min — ABNORMAL LOW (ref >60)
GFR calc non Af Amer: 35 mL/min — ABNORMAL LOW (ref >60)

## 2016-02-26 LAB — TROPONIN I
Troponin I: 1.04 ng/mL (ref <0.031)
Troponin I: 1.01 ng/mL (ref <0.031)

## 2016-02-26 LAB — CBC
HCT: 39.8 % (ref 39.0–52.0)
HEMOGLOBIN: 13.4 g/dL (ref 13.0–17.0)
MCH: 32.4 pg (ref 26.0–34.0)
MCHC: 33.7 g/dL (ref 30.0–36.0)
MCV: 96.1 fL (ref 78.0–100.0)
Platelets: 222 10*3/uL (ref 150–400)
RBC: 4.14 MIL/uL — AB (ref 4.22–5.81)
RDW: 13.5 % (ref 11.5–15.5)
WBC: 14.4 10*3/uL — AB (ref 4.0–10.5)

## 2016-02-26 LAB — CBG MONITORING, ED
GLUCOSE-CAPILLARY: 187 mg/dL — AB (ref 65–99)
Glucose-Capillary: 173 mg/dL — ABNORMAL HIGH (ref 65–99)

## 2016-02-26 LAB — GLUCOSE, CAPILLARY: Glucose-Capillary: 237 mg/dL — ABNORMAL HIGH (ref 65–99)

## 2016-02-26 LAB — STREP PNEUMONIAE URINARY ANTIGEN: STREP PNEUMO URINARY ANTIGEN: NEGATIVE

## 2016-02-26 LAB — BRAIN NATRIURETIC PEPTIDE: B Natriuretic Peptide: 171 pg/mL — ABNORMAL HIGH (ref 0.0–100.0)

## 2016-02-26 MED ORDER — DEXTROSE 5 % IV SOLN: 1.0000 g | Freq: Three times a day (TID) | INTRAVENOUS | Status: DC

## 2016-02-26 MED ORDER — FUROSEMIDE 10 MG/ML IJ SOLN
40.0000 mg | Freq: Once | INTRAMUSCULAR | Status: AC
  Administered 2016-02-26: 40 mg via INTRAVENOUS
  Filled 2016-02-26: qty 4

## 2016-02-26 MED ORDER — SODIUM CHLORIDE 0.9% FLUSH
3.0000 mL | Freq: Two times a day (BID) | INTRAVENOUS | Status: DC
  Administered 2016-02-27 – 2016-03-02 (×10): 3 mL via INTRAVENOUS

## 2016-02-26 MED ORDER — INSULIN ASPART 100 UNIT/ML ~~LOC~~ SOLN
0.0000 [IU] | Freq: Three times a day (TID) | SUBCUTANEOUS | Status: DC
  Administered 2016-02-26 – 2016-02-27 (×4): 2 [IU] via SUBCUTANEOUS
  Administered 2016-02-28: 3 [IU] via SUBCUTANEOUS
  Administered 2016-02-28: 2 [IU] via SUBCUTANEOUS
  Administered 2016-02-28: 1 [IU] via SUBCUTANEOUS
  Administered 2016-02-29 (×2): 2 [IU] via SUBCUTANEOUS
  Administered 2016-02-29: 5 [IU] via SUBCUTANEOUS
  Administered 2016-03-01: 3 [IU] via SUBCUTANEOUS
  Administered 2016-03-01: 2 [IU] via SUBCUTANEOUS
  Administered 2016-03-01: 1 [IU] via SUBCUTANEOUS
  Administered 2016-03-02 (×2): 3 [IU] via SUBCUTANEOUS
  Filled 2016-02-26: qty 1

## 2016-02-26 MED ORDER — ASPIRIN 81 MG PO CHEW
324.0000 mg | CHEWABLE_TABLET | Freq: Once | ORAL | Status: AC
  Administered 2016-02-26: 324 mg via ORAL
  Filled 2016-02-26: qty 4

## 2016-02-26 MED ORDER — FINASTERIDE 5 MG PO TABS
5.0000 mg | ORAL_TABLET | Freq: Every day | ORAL | Status: DC
  Administered 2016-02-27 – 2016-03-02 (×6): 5 mg via ORAL
  Filled 2016-02-26 (×6): qty 1

## 2016-02-26 MED ORDER — HEPARIN SODIUM (PORCINE) 5000 UNIT/ML IJ SOLN
5000.0000 [IU] | Freq: Three times a day (TID) | INTRAMUSCULAR | Status: DC
  Administered 2016-02-26 – 2016-03-02 (×15): 5000 [IU] via SUBCUTANEOUS
  Filled 2016-02-26 (×13): qty 1

## 2016-02-26 MED ORDER — DONEPEZIL HCL 5 MG PO TABS
5.0000 mg | ORAL_TABLET | Freq: Every day | ORAL | Status: DC
  Administered 2016-02-26 – 2016-03-02 (×6): 5 mg via ORAL
  Filled 2016-02-26 (×6): qty 1

## 2016-02-26 MED ORDER — METOPROLOL SUCCINATE ER 50 MG PO TB24
50.0000 mg | ORAL_TABLET | Freq: Every day | ORAL | Status: DC
  Administered 2016-02-27 – 2016-03-01 (×5): 50 mg via ORAL
  Filled 2016-02-26 (×5): qty 1

## 2016-02-26 MED ORDER — ALBUTEROL SULFATE (2.5 MG/3ML) 0.083% IN NEBU
2.5000 mg | INHALATION_SOLUTION | Freq: Four times a day (QID) | RESPIRATORY_TRACT | Status: DC
  Administered 2016-02-26: 2.5 mg via RESPIRATORY_TRACT
  Filled 2016-02-26: qty 3

## 2016-02-26 MED ORDER — INSULIN ASPART 100 UNIT/ML ~~LOC~~ SOLN: 0.0000 [IU] | Freq: Three times a day (TID) | SUBCUTANEOUS | Status: DC

## 2016-02-26 MED ORDER — FUROSEMIDE 10 MG/ML IJ SOLN
40.0000 mg | Freq: Two times a day (BID) | INTRAMUSCULAR | Status: DC
  Administered 2016-02-27 (×3): 40 mg via INTRAVENOUS
  Filled 2016-02-26 (×3): qty 4

## 2016-02-26 MED ORDER — GUAIFENESIN ER 600 MG PO TB12
1200.0000 mg | ORAL_TABLET | Freq: Two times a day (BID) | ORAL | Status: DC
  Administered 2016-02-27 – 2016-03-02 (×10): 1200 mg via ORAL
  Filled 2016-02-26 (×11): qty 2

## 2016-02-26 MED ORDER — ASPIRIN EC 325 MG PO TBEC
325.0000 mg | DELAYED_RELEASE_TABLET | Freq: Every day | ORAL | Status: DC
  Administered 2016-02-27 – 2016-03-02 (×5): 325 mg via ORAL
  Filled 2016-02-26 (×5): qty 1

## 2016-02-26 MED ORDER — AMLODIPINE BESYLATE 5 MG PO TABS
5.0000 mg | ORAL_TABLET | Freq: Every day | ORAL | Status: DC
  Administered 2016-02-26 – 2016-02-27 (×2): 5 mg via ORAL
  Filled 2016-02-26 (×2): qty 1

## 2016-02-26 MED ORDER — VANCOMYCIN HCL 10 G IV SOLR
1500.0000 mg | INTRAVENOUS | Status: DC
  Administered 2016-02-26: 1500 mg via INTRAVENOUS
  Filled 2016-02-26: qty 1500

## 2016-02-26 MED ORDER — DEXTROSE 5 % IV SOLN
2.0000 g | Freq: Once | INTRAVENOUS | Status: AC
  Administered 2016-02-26: 2 g via INTRAVENOUS
  Filled 2016-02-26: qty 2

## 2016-02-26 MED ORDER — DEXTROSE 5 % IV SOLN
1.0000 g | INTRAVENOUS | Status: DC
  Administered 2016-02-27 – 2016-03-01 (×4): 1 g via INTRAVENOUS
  Filled 2016-02-26 (×5): qty 1

## 2016-02-26 NOTE — ED Notes (Signed)
CBG 187 

## 2016-02-26 NOTE — ED Notes (Signed)
Condom cath applied

## 2016-02-26 NOTE — ED Notes (Signed)
Pt to ER via [facility] from home. Pt home health nurse called due to increasing dyspnea with exertion and worsening +4 pedal edema. Pt has hx of COPD and CHF. Recently admitted mid February for Bronchitis. Pt presents with productive cough. Per EMS VS - 106/25, O2 sats 96% on 4L, and HR 90 atrial fibrillation. Pt O2 sat reading 90% on RA, pt placed on 2L. Pt is a/o x4.

## 2016-02-26 NOTE — Progress Notes (Addendum)
Pharmacy Antibiotic Note  [name] [name] is a 82 y.o. male admitted on 02/26/2016 with pneumonia.  Pharmacy has been consulted for Vancomycin dosing and antibiotic renal dosing adjustments.  WBC 14.7, Tmax 100.5, SCr 1.54 (CKD stage III) eCrCl ~24mL/min  Plan: Vancomycin 1500mg  IV Q48h F/U c/s, renal fxn, LOT, VT@ss  Cefepime 2g x1 in the ED Cefepime 1g IV Q24h     Temp (24hrs), Avg:99.5 F (37.5 C), Min:98.5 F (36.9 C), Max:100.5 F (38.1 C)   Recent Labs Lab 02/26/16 1350 02/26/16 1359  WBC  --  14.7*  CREATININE  --  1.54*  LATICACIDVEN 1.23  --     CrCl cannot be calculated (Unknown ideal weight.).    Allergies  Allergen Reactions  . Sulfa Antibiotics Swelling    hands    Antimicrobials this admission: 3/8 Vancomycin>> 3/8 cefepime>>  Thank you for allowing pharmacy to be a part of this patient's care.  Mikita Lesmeister C. Lennox Grumbles, PharmD Pharmacy Resident  Pager: [phone] 02/26/2016 3:33 PM

## 2016-02-26 NOTE — ED Notes (Signed)
Attempted to call report

## 2016-02-26 NOTE — Consult Note (Signed)
CARDIOLOGY CONSULT NOTE   Patient ID: [name] [name] MRN: [id number] DOB/AGE: 80-Aug-1927 80 y.o.  Admit date: 02/26/2016  Primary Physician   Haywood Pao, MD Primary Cardiologist: Dr. Burt Knack Reason for Consultation: Afib, elevated troponin  HPI: [name] [name] is a 82 year old male with a PMH of permanent Afib, HTN, obesity, diastolic heart failure, HLD, DM, stage III CKD.  His Xarelto was discontinued due to non compliance.  Of note, he has been previously non compliant with all of his cardiac meds.       He was recently hospitalized in 01/2016 for sepsis due CAP, acute respiratory failure and Afib RVR. He was discharged to home and getting home health nursing and PT services.   Today, he was walking with PT and felt SOB then fell.  He was assisted to the floor. His o2 sats were 88% on 2L and EMS was called. EKG showed Afib rate 84.  He denies any chest pain, no palpitations, no diaphoresis.  He is chronically SOB, but says that he is more short of breath than usual.  His troponin was 1.04.  Chest xray shows LLL PNA.       Past Medical History  Diagnosis Date  . Hypertension   . Hyperlipidemia   . Chronic atrial fibrillation ([place])   . Urinary retention foley last changed 3 weeks ago  . Arthritis   . Sleep apnea     no cpap used, sleep study was several yrs ago  . Diabetes mellitus without complication [facility])      Past Surgical History  Procedure Laterality Date  . Back surgery  yrs ago    lower back surgery  . Tonsillectomy  as child  . Prostate surgery  yrs ago, no cancer  . Transurethral resection of prostate  02/22/2012    Procedure: TRANSURETHRAL RESECTION OF THE PROSTATE WITH GYRUS INSTRUMENTS;  Surgeon: Molli Hazard, MD;  Location: [facility];  Service: Urology;  Laterality: N/A;        Allergies  Allergen Reactions  . Sulfa Antibiotics Swelling    hands    I have reviewed the patient's current medications . albuterol  2.5 mg Nebulization Q6H  .  amLODipine  5 mg Oral Daily  . aspirin  324 mg Oral Once  . aspirin EC  325 mg Oral Daily  . donepezil  5 mg Oral Daily  . finasteride  5 mg Oral Daily  . guaiFENesin  1,200 mg Oral BID  . heparin  5,000 Units Subcutaneous 3 times per day  . metoprolol succinate  50 mg Oral QHS  . sodium chloride flush  3 mL Intravenous Q12H   . ceFEPime (MAXIPIME) IV    . vancomycin       Prior to Admission medications   Medication Sig Start Date End Date Taking? Authorizing Provider  amLODipine (NORVASC) 5 MG tablet Take 1 tablet (5 mg total) by mouth daily. 02/02/16  Yes Modena Jansky, MD  aspirin EC 325 MG tablet Take 1 tablet (325 mg total) by mouth daily. 10/16/15  Yes Sherren Mocha, MD  donepezil (ARICEPT) 5 MG tablet Take 5 mg by mouth daily. 01/09/16  Yes Historical Provider, MD  finasteride (PROSCAR) 5 MG tablet Take 5 mg by mouth daily.   Yes Historical Provider, MD  guaiFENesin (MUCINEX) 600 MG 12 hr tablet Take 2 tablets (1,200 mg total) by mouth 2 (two) times daily. 02/02/16  Yes Modena Jansky, MD  metoprolol succinate (  TOPROL-XL) 50 MG 24 hr tablet Take 1 tablet (50 mg total) by mouth at bedtime. Patient taking differently: Take 50 mg by mouth daily.  10/16/15  Yes Sherren Mocha, MD  MYRBETRIQ 50 MG TB24 tablet Take 50 mg by mouth daily. 12/16/15  Yes Historical Provider, MD  amoxicillin-clavulanate (AUGMENTIN) 875-125 MG tablet Take 1 tablet by mouth 2 (two) times daily. Patient not taking: Reported on 02/26/2016 02/02/16   Modena Jansky, MD  levalbuterol Penne Lash) 0.63 MG/3ML nebulizer solution Take 3 mLs (0.63 mg total) by nebulization every 6 (six) hours as needed for wheezing or shortness of breath. Patient not taking: Reported on 02/26/2016 04/22/15   Belkys A Regalado, MD  predniSONE (DELTASONE) 10 MG tablet Take 3 tabs daily for 2 days, then 2 tabs daily for 2 days, then 1 tab daily for 2 days, then stop. Patient not taking: Reported on 02/26/2016 02/03/16   Modena Jansky, MD      Social History   Social History  . Marital Status: Married    Spouse Name: N/A  . Number of Children: N/A  . Years of Education: N/A   Occupational History  . Not on file.   Social History Main Topics  . Smoking status: Former Smoker -- 0.50 packs/day for 50 years    Types: Cigarettes  . Smokeless tobacco: Never Used  . Alcohol Use: Yes     Comment: rare use  . Drug Use: No  . Sexual Activity: No   Other Topics Concern  . Not on file   Social History Narrative    Family Status  Relation Status Death Age  . Mother Deceased   . Father Deceased   . Sister Deceased   . Brother Deceased    Family History  Problem Relation Age of Onset  . Heart attack Father      ROS:  Full 14 point review of systems complete and found to be negative unless listed above.  Physical Exam: Blood pressure 126/81, pulse 97, temperature 100.5 F (38.1 C), temperature source Rectal, resp. rate 20, SpO2 94 %.  General: Well developed, well nourished,elderly male in no acute distress Head: Eyes PERRLA, No xanthomas.   Normocephalic and atraumatic, oropharynx without edema or exudate. Dentition: Poor Lungs: Rhochi in all fields.  Heart: Heart irregular rate and rhythm with S1, S2  No murmur. Pulses are 2+ extrem.   Neck: No carotid bruits. No lymphadenopathy. No JVD. Abdomen: Bowel sounds present, abdomen soft and non-tender without masses or hernias noted. Msk:  No spine or cva tenderness. No weakness, no joint deformities or effusions. Extremities: No clubbing or cyanosis.  3+ edema on LLE, 2+ on RLE Neuro: Alert and oriented X 3. No focal deficits noted. Psych:  Good affect, responds appropriately Skin: No rashes or lesions noted.  Labs:   Lab Results  Component Value Date   WBC 14.7* 02/26/2016   HGB 13.5 02/26/2016   HCT 39.6 02/26/2016   MCV 95.9 02/26/2016   PLT 220 02/26/2016    Recent Labs Lab 02/26/16 1359  NA 134*  K 4.0  CL 99*  CO2 24  BUN 17  CREATININE  1.54*  CALCIUM 8.9  GLUCOSE 186*    Recent Labs  02/26/16 1359  TROPONINI 1.04*   No results for input(s): TROPIPOC in the last 72 hours. B NATRIURETIC PEPTIDE  Date/Time Value Ref Range Status  02/26/2016 01:59 PM 171.0* 0.0 - 100.0 pg/mL Final  04/21/2015 05:24 AM 83.1 0.0 - 100.0 pg/mL Final  Lab Results  Component Value Date   CHOL 150 01/29/2016   HDL 35* 01/29/2016   LDLCALC 106* 01/29/2016   TRIG 44 01/29/2016     Echo: Feb. 2017 - Left ventricle: The cavity size was normal. There was mild focal  basal hypertrophy of the septum. Systolic function was normal.  The estimated ejection fraction was in the range of 55% to 60%.  Wall motion was normal; there were no regional wall motion  abnormalities. - Aortic valve: There was mild regurgitation. - Mitral valve: There was mild to moderate regurgitation directed  centrally. - Pulmonary arteries: PA peak pressure: 33 mm Hg (S).  ECG: Afib    Radiology:  Dg Chest 2 View  02/26/2016  CLINICAL DATA:  Shortness of breath and cough EXAM: CHEST  2 VIEW COMPARISON:  01/31/2016 FINDINGS: Cardiac shadow within normal limits. The lungs are well aerated bilaterally. Left lower lobe infiltrate are noted. No acute bony abnormality is seen. IMPRESSION: Left lower lobe infiltrate. Electronically Signed   By: Inez Catalina M.D.   On: 02/26/2016 14:38    ASSESSMENT AND PLAN:    Principal Problem:   Pneumonia Active Problems:   HYPERTENSION, BENIGN   Atrial fibrillation with RVR (HCC)   Dyspnea   Fall   Acute renal failure superimposed on stage 3 chronic kidney disease (HCC)   Hyperglycemia   BPH (benign prostatic hypertrophy)   Diabetes mellitus without complication (HCC)   Acute respiratory failure with hypoxia (HCC)   HCAP (healthcare-associated pneumonia)   Elevated troponin  1. Elevated troponin - Most likely due to PNA. - Chest pain free - Will cycle as he is being admitted per IM - If enzymes peak, can consider  ischemic work up.  2. Chronic diastolic CHF - Edematous, BNP 171.   - MD to advise re: diuresis.  3. Afib - Chronic - Rate controlled - No on anticoagulation due to non compliance. - Continue BB  4. CAP - Per IM - On antibiotics.   Signed: Arbutus Leas, NP 02/26/2016 4:44 PM   Co-Sign MD  Patient examined chart reviewed.  No chest pain and no history of CAD   Admitted with pneumonia , dyspnea, assisted fall and chronic LE edema Chronic afib not on anticoagulation  Not clear why troponin was checked No acute ST/T wave changes and recent echo with normal LV function Given age and lack of chest pain and no history of CAD would not pursue Can cycle enzymes and check serial ECG;s  No indication for cath or  Stress testing at this time  Exam with Obesity atelectasis at bases , condom  Catheter in place with plus 2 bilateral LE edema stasis changes on left  Jenkins Rouge

## 2016-02-26 NOTE — ED Provider Notes (Addendum)
CSN: [id number]     Arrival date & time 02/26/16  1228 History   First MD Initiated Contact with Patient 02/26/16 1248     Chief Complaint  Patient presents with  . Congestive Heart Failure     HPI   82 year old male with PMH of HTN, HLD, DM 2, chronic A. fib- Taken off Xarelto due to noncompliance and currently on aspirin, OSA-not on CPAP, stage III CKD, presents from home, via [facility].   admitted with an episode of sepsis and a presumed pneumonia in February. Discharged home. Has had a persistent cough and weakness since returning home. Is getting home physical therapy for ambulation. Also getting home health care nursing.    Today the patient was walking with his therapist on a couch, [place] room, and back to the couch. He became weak and short of breath and had a fall. Was helped to the ground by his therapist and did not have injury. Home health care nurse was summoned. He was noted to have progressive and increasing lower extremity edema. Was 88% on his 2 L of oxygen. He was increased to 4 by EMS, and transferred here.  Continued congested cough. Patient states he had little to no edema when entering the hospital last time, and has developed progressive lower extremity edema in his legs feel heavy. Is not on diuretics. His never been diagnosed with congestive heart failure.  He has received Prevnar, and did receive a yearly flu vaccine this year.  Echo 01/2016 Left ventricle: The cavity size was normal. There was mild focal basal hypertrophy of the septum. Systolic function was normal. The estimated ejection fraction was in the range of 55% to 60%. Wall motion was normal; there were no regional wall motion abnormalities. The study was not technically sufficient to allow evaluation of LV diastolic dysfunction due to atrial fibrillation.      Past Medical History  Diagnosis Date  . Hypertension   . Hyperlipidemia   . Chronic atrial fibrillation ([place])   . Urinary retention  foley last changed 3 weeks ago  . Arthritis   . Sleep apnea     no cpap used, sleep study was several yrs ago  . Diabetes mellitus without complication [facility])    Past Surgical History  Procedure Laterality Date  . Back surgery  yrs ago    lower back surgery  . Tonsillectomy  as child  . Prostate surgery  yrs ago, no cancer  . Transurethral resection of prostate  02/22/2012    Procedure: TRANSURETHRAL RESECTION OF THE PROSTATE WITH GYRUS INSTRUMENTS;  Surgeon: Molli Hazard, MD;  Location: [facility];  Service: Urology;  Laterality: N/A;       Family History  Problem Relation Age of Onset  . Heart attack Father    Social History  Substance Use Topics  . Smoking status: Former Smoker -- 0.50 packs/day for 50 years    Types: Cigarettes  . Smokeless tobacco: Never Used  . Alcohol Use: Yes     Comment: rare use    Review of Systems  Constitutional: Positive for appetite change and fatigue. Negative for fever, chills and diaphoresis.  HENT: Negative for mouth sores, sore throat and trouble swallowing.   Eyes: Negative for visual disturbance.  Respiratory: Positive for cough and shortness of breath. Negative for chest tightness and wheezing.   Cardiovascular: Positive for leg swelling. Negative for chest pain.  Gastrointestinal: Negative for nausea, vomiting, abdominal pain, diarrhea and abdominal distention.  Endocrine: Negative  for polydipsia, polyphagia and polyuria.  Genitourinary: Negative for dysuria, frequency and hematuria.  Musculoskeletal: Negative for gait problem.  Skin: Negative for color change, pallor and rash.  Neurological: Positive for weakness. Negative for dizziness, syncope, light-headedness and headaches.  Hematological: Does not bruise/bleed easily.  Psychiatric/Behavioral: Negative for behavioral problems and confusion.      Allergies  Sulfa antibiotics  Home Medications   Prior to Admission medications   Medication Sig Start Date End Date  Taking? Authorizing Provider  amLODipine (NORVASC) 5 MG tablet Take 1 tablet (5 mg total) by mouth daily. 02/02/16  Yes Modena Jansky, MD  aspirin EC 325 MG tablet Take 1 tablet (325 mg total) by mouth daily. 10/16/15  Yes Sherren Mocha, MD  donepezil (ARICEPT) 5 MG tablet Take 5 mg by mouth daily. 01/09/16  Yes Historical Provider, MD  finasteride (PROSCAR) 5 MG tablet Take 5 mg by mouth daily.   Yes Historical Provider, MD  guaiFENesin (MUCINEX) 600 MG 12 hr tablet Take 2 tablets (1,200 mg total) by mouth 2 (two) times daily. 02/02/16  Yes Modena Jansky, MD  metoprolol succinate (TOPROL-XL) 50 MG 24 hr tablet Take 1 tablet (50 mg total) by mouth at bedtime. Patient taking differently: Take 50 mg by mouth daily.  10/16/15  Yes Sherren Mocha, MD  MYRBETRIQ 50 MG TB24 tablet Take 50 mg by mouth daily. 12/16/15  Yes Historical Provider, MD  amoxicillin-clavulanate (AUGMENTIN) 875-125 MG tablet Take 1 tablet by mouth 2 (two) times daily. Patient not taking: Reported on 02/26/2016 02/02/16   Modena Jansky, MD  levalbuterol Penne Lash) 0.63 MG/3ML nebulizer solution Take 3 mLs (0.63 mg total) by nebulization every 6 (six) hours as needed for wheezing or shortness of breath. Patient not taking: Reported on 02/26/2016 04/22/15   Belkys A Regalado, MD  predniSONE (DELTASONE) 10 MG tablet Take 3 tabs daily for 2 days, then 2 tabs daily for 2 days, then 1 tab daily for 2 days, then stop. Patient not taking: Reported on 02/26/2016 02/03/16   Modena Jansky, MD   BP 123/80 mmHg  Pulse 92  Temp(Src) 100.5 F (38.1 C) (Rectal)  Resp 23  SpO2 92% Physical Exam  Constitutional: He is oriented to person, place, and time. No distress.  Large stature elderly male. Slightly tachypneic, with a wet sounding respiratory cycle. States he is producing yellow sputum  HENT:  Head: Normocephalic.  Eyes: Conjunctivae are normal. Pupils are equal, round, and reactive to light. No scleral icterus.  Neck: Normal range of  motion. Neck supple. No thyromegaly present.  Thick neck, cannot appreciate JVD.  Cardiovascular: Normal rate and regular rhythm.  Exam reveals no gallop and no friction rub.   No murmur heard. A. fib with controlled response on the monitor. Irregularly irregular  Pulmonary/Chest: Effort normal and breath sounds normal. No respiratory distress. He has no wheezes. He has no rales.  Rales and rhonchi to the mid scapula and diffusely to the anterior chest  Abdominal: Soft. Bowel sounds are normal. He exhibits no distension. There is no tenderness. There is no rebound.  Musculoskeletal: Normal range of motion.  Neurological: He is alert and oriented to person, place, and time.  Skin: Skin is warm and dry. No rash noted.  Plus symmetric lower show no edema without erythema pain or cording  Psychiatric: He has a normal mood and affect. His behavior is normal.    ED Course  Procedures (including critical care time) Labs Review Labs Reviewed  CBC WITH  DIFFERENTIAL/PLATELET - Abnormal; Notable for the following:    WBC 14.7 (*)    RBC 4.13 (*)    Neutro Abs 11.8 (*)    All other components within normal limits  BASIC METABOLIC PANEL - Abnormal; Notable for the following:    Sodium 134 (*)    Chloride 99 (*)    Glucose, Bld 186 (*)    Creatinine, Ser 1.54 (*)    GFR calc non Af Amer 39 (*)    GFR calc Af Amer 45 (*)    All other components within normal limits  TROPONIN I - Abnormal; Notable for the following:    Troponin I 1.04 (*)    All other components within normal limits  BRAIN NATRIURETIC PEPTIDE - Abnormal; Notable for the following:    B Natriuretic Peptide 171.0 (*)    All other components within normal limits  I-STAT CG4 LACTIC ACID, ED    Imaging Review Dg Chest 2 View  02/26/2016  CLINICAL DATA:  Shortness of breath and cough EXAM: CHEST  2 VIEW COMPARISON:  01/31/2016 FINDINGS: Cardiac shadow within normal limits. The lungs are well aerated bilaterally. Left lower lobe  infiltrate are noted. No acute bony abnormality is seen. IMPRESSION: Left lower lobe infiltrate. Electronically Signed   By: Inez Catalina M.D.   On: 02/26/2016 14:38   I have personally reviewed and evaluated these images and lab results as part of my medical decision-making.   EKG Interpretation   Date/Time:  Wednesday February 26 2016 13:16:35 EST Ventricular Rate:  84 PR Interval:    QRS Duration: 112 QT Interval:  364 QTC Calculation: 430 R Axis:   43 Text Interpretation:  Atrial fibrillation Borderline intraventricular  conduction delay Low voltage, extremity and precordial leads Confirmed by  Jeneen Rinks  MD, [place] ([id number]) on 02/26/2016 1:25:25 PM Also confirmed by Jeneen Rinks   MD, [place] ([id number])  on 02/26/2016 2:20:12 PM      MDM   Final diagnoses:  Healthcare-associated pneumonia  Dependent edema    Clinically signs of congestive heart failure with abnormal pulmonary exam, increased O2 demand a dependent edema. Did not have effusion on his most recent echo. Had EF of 55-60. Unable to determine diastolic dysfunction due to A. fib. Also febrile here. Cultures were obtained. Labs including cultures, CBC, and lactate pending. Chest x-ray pending. Plan IV Lasix, we'll reevaluate.  15:22:  Patient with infiltrative left base. No lactic acidosis.  Showed A. fib without change in morphology. However troponin is elevated. He is hypoxemic and febrile. He is not tachycardic. Has not been hypotensive. Mentating well. Given IV vancomycin and Maxipime. Will discuss admission with hospitalist.  15:51:  Discussed with the administrator for a [facility] cardiology. Patient will be seen in consultation by the physician      Tanna Furry, MD 02/26/16 [id number]  Tanna Furry, MD 02/26/16 1533  Tanna Furry, MD 02/26/16 [phone]

## 2016-02-26 NOTE — H&P (Signed)
[facility] History and Physical  DIOMEDES ORRIS [id number] DOB: [birth year] DOA: 02/26/2016  Referring physician:  PCP: Haywood Pao, MD   Chief Complaint:  HPI: [name] [name] is a 82 y.o. male with a history of diastolic heart failure, stage IIIc KD, atrial fibrillation on Xarelto, not on CPAP, recently hospitalized in 1/16- 01/2016 for sepsis in the setting of presumed pneumonia, completing a course of Augmentin as outpatient, presenting to the ED with increasing dyspnea, and generalized weakness. He was working with his PT on a couch, when he became weak and short of breath, sustaining a fall. He did not hit his head. He was noted to have this sats, with 88% on 2 L of oxygen, which was increased to 4 L by EMS and was transferred to the ER.  Denies rhinorrhea but reports "wet" sputum". Denies any hemoptysis. He reported subjectiove fevers, chills. Denies any chest pain, chest wall pain or palpitations He does have increased lower extremity edema. He denies any abdominal pain. He denies any nausea or vomiting. No dizziness or vertigo. No confusion was reported. Of note, he did receive Prevnar and a yearly flu vaccine this year.  At the ED, BP 126/81 mmHg  Pulse 97  Temp(Src) 100.5 F (38.1 C) (Rectal)  Resp 20  SpO2 94% CBC  shows leukocytosis with WBC 14.7 ( while on prednisone, improved since 01/2016 in the 17s) Chest x-ray shows left lower lobe infiltrate, consistent with pneumonia.Labs, including blood cultures are pending. He was placed on IV vancomycin and Maxipime CMET remarkable for normal K, creatinine 1.54, otherwise normal. Troponin 1.04, EKG with Afib without any new changes QTC 430. BNP 171.Lactate 1.23.  Last 2-D echo showed normal systolic function, with ejection fraction 55-60%, unable to assess diastolic dysfunction due to atrial fibrillation. Cardiology consultation is pending.    Review of Systems:  Constitutional:  No weight loss, night sweats, +fevers,  +chills, +fatigue HEENT: No headaches, difficulty swallowing,tooth/dental problems, sore throat Cardio-vascular:  No chest pain, orthopnea, PND, swelling in lower extremities, anasarca, dizziness, palpitations  GI:  No heartburn, indigestion, abdominal pain, nausea, vomiting, diarrhea, change in bowel habits, loss of appetite  Respiratory:  + shortness of breath with exertion or at rest. + excess mucus, +productive cough, No non-productive cough, No coughing up of blood. No change in color of mucus. No wheezing .No chest wall deformity  Skin:  No rash or lesions.  GU:  no dysuria, change in color of urine, no urgency or frequency. No flank pain.  Musculoskeletal:   No joint pain or swelling. No decreased range of motion. No back pain.  Psych:  No change in mood or affect. No depression or anxiety. No memory loss.  Neuro:  No change in sensation, unilateral strength, or cognitive abilities  All other systems were reviewed and are negative.  Past Medical History  Diagnosis Date  . Hypertension   . Hyperlipidemia   . Chronic atrial fibrillation ([place])   . Urinary retention foley last changed 3 weeks ago  . Arthritis   . Sleep apnea     no cpap used, sleep study was several yrs ago  . Diabetes mellitus without complication [facility])    Past Surgical History  Procedure Laterality Date  . Back surgery  yrs ago    lower back surgery  . Tonsillectomy  as child  . Prostate surgery  yrs ago, no cancer  . Transurethral resection of prostate  02/22/2012    Procedure: TRANSURETHRAL RESECTION OF THE PROSTATE  WITH GYRUS INSTRUMENTS;  Surgeon: Molli Hazard, MD;  Location: [facility];  Service: Urology;  Laterality: N/A;       Social History:  reports that he has quit smoking. His smoking use included Cigarettes. He has a 25 pack-year smoking history. He has never used smokeless tobacco. He reports that he drinks alcohol. He reports that he does not use illicit drugs.  Allergies  Allergen  Reactions  . Sulfa Antibiotics Swelling    hands    Family History  Problem Relation Age of Onset  . Heart attack Father      Prior to Admission medications   Medication Sig Start Date End Date Taking? Authorizing Provider  amLODipine (NORVASC) 5 MG tablet Take 1 tablet (5 mg total) by mouth daily. 02/02/16  Yes Modena Jansky, MD  aspirin EC 325 MG tablet Take 1 tablet (325 mg total) by mouth daily. 10/16/15  Yes Sherren Mocha, MD  donepezil (ARICEPT) 5 MG tablet Take 5 mg by mouth daily. 01/09/16  Yes Historical Provider, MD  finasteride (PROSCAR) 5 MG tablet Take 5 mg by mouth daily.   Yes Historical Provider, MD  guaiFENesin (MUCINEX) 600 MG 12 hr tablet Take 2 tablets (1,200 mg total) by mouth 2 (two) times daily. 02/02/16  Yes Modena Jansky, MD  metoprolol succinate (TOPROL-XL) 50 MG 24 hr tablet Take 1 tablet (50 mg total) by mouth at bedtime. Patient taking differently: Take 50 mg by mouth daily.  10/16/15  Yes Sherren Mocha, MD  MYRBETRIQ 50 MG TB24 tablet Take 50 mg by mouth daily. 12/16/15  Yes Historical Provider, MD  amoxicillin-clavulanate (AUGMENTIN) 875-125 MG tablet Take 1 tablet by mouth 2 (two) times daily. Patient not taking: Reported on 02/26/2016 02/02/16   Modena Jansky, MD  levalbuterol Penne Lash) 0.63 MG/3ML nebulizer solution Take 3 mLs (0.63 mg total) by nebulization every 6 (six) hours as needed for wheezing or shortness of breath. Patient not taking: Reported on 02/26/2016 04/22/15   Belkys A Regalado, MD  predniSONE (DELTASONE) 10 MG tablet Take 3 tabs daily for 2 days, then 2 tabs daily for 2 days, then 1 tab daily for 2 days, then stop. Patient not taking: Reported on 02/26/2016 02/03/16   Modena Jansky, MD   Physical Exam: Filed Vitals:   02/26/16 1315 02/26/16 1324 02/26/16 1345 02/26/16 1515  BP: 123/80  121/66 126/81  Pulse:   94 97  Temp:  100.5 F (38.1 C)    TempSrc:  Rectal    Resp: 23  21 20   SpO2: 92%  91% 94%    Wt Readings from Last 3  Encounters:  02/01/16 104.8 kg (231 lb 0.7 oz)  10/16/15 100.699 kg (222 lb)  04/22/15 98.022 kg (216 lb 1.6 oz)    General: Appears calm and comfortable Eyes:  PERRL, EOMI, normal lids, iris ENT: grossly normal hearing, lips & tongue Neck: no lymphadenopathy, masses or thyromegaly Cardiovascular: irregular rate and rythm, no murmurs, rubs or gallops. 2-3+ lower extremity edema   Respiratory: diffuse expiratory rhonchi with mild wheezing,and rales. Normal respiratory effort. Abdomen: soft,non-tender, normal bowel sounds Skin: no rash or induration seen on limited exam. No open lesions. Musculoskeletal:  grossly normal tone in both upper and lower extremities Psychiatric: grossly normal mood and affect, speech fluent and appropriate Neurologic: CN 2-12 grossly intact, moves all extremities in coordinated fashion.          Labs on Admission:  Basic Metabolic Panel:  Recent Labs Lab 02/26/16 1359  NA 134*  K 4.0  CL 99*  CO2 24  GLUCOSE 186*  BUN 17  CREATININE 1.54*  CALCIUM 8.9     CBC:  Recent Labs Lab 02/26/16 1359  WBC 14.7*  NEUTROABS 11.8*  HGB 13.5  HCT 39.6  MCV 95.9  PLT 220    Cardiac Enzymes:  Recent Labs Lab 02/26/16 1359  TROPONINI 1.04*    BNP (last 3 results)  Recent Labs  04/21/15 0524 02/26/16 1359  BNP 83.1 171.0*    ProBNP (last 3 results) No results for input(s): PROBNP in the last 8760 hours.    Radiological Exams on Admission: Dg Chest 2 View  02/26/2016  CLINICAL DATA:  Shortness of breath and cough EXAM: CHEST  2 VIEW COMPARISON:  01/31/2016 FINDINGS: Cardiac shadow within normal limits. The lungs are well aerated bilaterally. Left lower lobe infiltrate are noted. No acute bony abnormality is seen. IMPRESSION: Left lower lobe infiltrate. Electronically Signed   By: Inez Catalina M.D.   On: 02/26/2016 14:38    EKG: Independently reviewed.    Assessment/Plan Principal Problem:   Pneumonia Active Problems:    HYPERTENSION, BENIGN   Atrial fibrillation with RVR (HCC)   Dyspnea   Fall   Acute renal failure superimposed on stage 3 chronic kidney disease (HCC)   Hyperglycemia   BPH (benign prostatic hypertrophy)   Diabetes mellitus without complication (HCC)   Acute respiratory failure with hypoxia (HCC)    Acute respiratory failure with hypoxia due to Healthcare associated pneumonia . He is being readmitted after being discharged in 02/07/16  For sepsis due to pneumonia. Chest x-ray shows left lower lobe infiltrate, consistent with pneumonia .Labs, including blood cultures are pending. Lactic acid normal at 1.23. He was placed on IV vancomycin and Maxipime. Tmax is 100.5. Osats  Are  94% in 4l (was 88 in RA)  Admit  to telemetry  -Antibiotics per protocol  -Blood cultures  -Strep pneumo urine antigen -Legionella urine antigen -Oxygen supplementation and wean as able -Nebulizers every 6 hours, q 4 prn Symbicort BID   Leukocytosis, likely related to underlying infection and steroids. Currently 14.1 Cultures pending -  abx as above -  Repeat WBC in AM  Chronic diastolic heart failure in the setting of  AFib, LLL PNA , peripheral edema,  Elevated Troponin 1.04, EKG with Afib without any new changes QTC 430. BNP 171.Lactate 1.23.  Last 2-D echo 01/31/16 showed normal systolic function, with ejection fraction 55-60%, unable to assess diastolic dysfunction due to atrial fibrillation.  Cardiology consultation is pending. Received Lasix 40 mg IV x1, and as per Cards Careful use of IVF Daily weights, strict I/O Repeat 2 D echo  Continue home blood pressure medications  O2 to maintain O sats >90% Cardiology to see  Chronic kidney disease stage III, baseline creatinine1.5  Today at 1.54 -  Minimize nephrotoxins and renally dose medications BMET in am  DMII  in the setting of recent steroids currently with blood glucose at 186. A1C was 8.3 on 2/8 SSI  Monitor CBG q 4 hrs     Code Status: Full  Code  DVT Prophylaxis: Heparin Family Communication: at bedside Disposition Plan: Pending Improvement. Admitted for  tele bed. Expected LOS 24-48 hrs    [facility] E,PA-C [facility] [email or web address] Password [email or web address]

## 2016-02-27 ENCOUNTER — Inpatient Hospital Stay (HOSPITAL_COMMUNITY): Payer: Medicare Other

## 2016-02-27 ENCOUNTER — Encounter (HOSPITAL_COMMUNITY): Payer: Self-pay | Admitting: Radiology

## 2016-02-27 DIAGNOSIS — E119 Type 2 diabetes mellitus without complications: Secondary | ICD-10-CM

## 2016-02-27 DIAGNOSIS — J9601 Acute respiratory failure with hypoxia: Secondary | ICD-10-CM

## 2016-02-27 DIAGNOSIS — I10 Essential (primary) hypertension: Secondary | ICD-10-CM

## 2016-02-27 LAB — LEGIONELLA ANTIGEN, URINE

## 2016-02-27 LAB — INFLUENZA PANEL BY PCR (TYPE A & B)
H1N1FLUPCR: NOT DETECTED
INFLAPCR: NEGATIVE
Influenza B By PCR: NEGATIVE

## 2016-02-27 LAB — MRSA PCR SCREENING: MRSA by PCR: NEGATIVE

## 2016-02-27 LAB — TROPONIN I: Troponin I: 1.13 ng/mL (ref <0.031)

## 2016-02-27 LAB — HEPATIC FUNCTION PANEL
ALBUMIN: 2.5 g/dL — AB (ref 3.5–5.0)
ALT: 29 U/L (ref 17–63)
AST: 22 U/L (ref 15–41)
Alkaline Phosphatase: 68 U/L (ref 38–126)
Bilirubin, Direct: 0.2 mg/dL (ref 0.1–0.5)
Indirect Bilirubin: 0.4 mg/dL (ref 0.3–0.9)
Total Bilirubin: 0.6 mg/dL (ref 0.3–1.2)
Total Protein: 6.7 g/dL (ref 6.5–8.1)

## 2016-02-27 LAB — GLUCOSE, CAPILLARY
GLUCOSE-CAPILLARY: 189 mg/dL — AB (ref 65–99)
Glucose-Capillary: 176 mg/dL — ABNORMAL HIGH (ref 65–99)
Glucose-Capillary: 182 mg/dL — ABNORMAL HIGH (ref 65–99)
Glucose-Capillary: 199 mg/dL — ABNORMAL HIGH (ref 65–99)

## 2016-02-27 MED ORDER — VANCOMYCIN HCL IN DEXTROSE 1-5 GM/200ML-% IV SOLN
1000.0000 mg | INTRAVENOUS | Status: DC
  Administered 2016-02-27 – 2016-03-01 (×4): 1000 mg via INTRAVENOUS
  Filled 2016-02-27 (×5): qty 200

## 2016-02-27 MED ORDER — ALBUTEROL SULFATE (2.5 MG/3ML) 0.083% IN NEBU
2.5000 mg | INHALATION_SOLUTION | Freq: Three times a day (TID) | RESPIRATORY_TRACT | Status: DC
  Administered 2016-02-27 – 2016-02-28 (×3): 2.5 mg via RESPIRATORY_TRACT
  Filled 2016-02-27 (×3): qty 3

## 2016-02-27 MED ORDER — ATORVASTATIN CALCIUM 20 MG PO TABS
20.0000 mg | ORAL_TABLET | Freq: Every day | ORAL | Status: DC
  Administered 2016-02-27 – 2016-03-01 (×4): 20 mg via ORAL
  Filled 2016-02-27: qty 2
  Filled 2016-02-27 (×2): qty 1

## 2016-02-27 MED ORDER — GUAIFENESIN-DM 100-10 MG/5ML PO SYRP
5.0000 mL | ORAL_SOLUTION | ORAL | Status: DC | PRN
  Administered 2016-02-27: 5 mL via ORAL
  Filled 2016-02-27: qty 5

## 2016-02-27 MED ORDER — LIVING WELL WITH DIABETES BOOK
Freq: Once | Status: AC
  Administered 2016-02-27: 18:00:00
  Filled 2016-02-27: qty 1

## 2016-02-27 MED ORDER — BENZONATATE 100 MG PO CAPS
200.0000 mg | ORAL_CAPSULE | Freq: Three times a day (TID) | ORAL | Status: DC | PRN
  Administered 2016-02-27 – 2016-03-01 (×4): 200 mg via ORAL
  Filled 2016-02-27 (×4): qty 2

## 2016-02-27 NOTE — Progress Notes (Signed)
Pharmacy Antibiotic Note  [name] [name] is a 82 y.o. male admitted on 02/26/2016 with pneumonia.    WBC 14.4, Tmax 100.5, SCr 1.56 (CKD stage III) eCrCl ~68mL/min  Plan: Change Vancomycin to 1000mg  IV Q24h base on current weight. F/U c/s, renal fxn, LOT, VT@ss  Cefepime 2g x1 in the ED Cefepime 1g IV Q24h   Height: 5\' 5"  (165.1 cm) Weight: 207 lb 14.3 oz (94.3 kg) IBW/kg (Calculated) : 61.5  Temp (24hrs), Avg:98.6 F (37 C), Min:97.6 F (36.4 C), Max:99.4 F (37.4 C)   Recent Labs Lab 02/26/16 1350 02/26/16 1359 02/26/16 2023  WBC  --  14.7* 14.4*  CREATININE  --  1.54* 1.66*  LATICACIDVEN 1.23  --   --     Estimated Creatinine Clearance: 32.5 mL/min (by C-G formula based on Cr of 1.66).    Allergies  Allergen Reactions  . Sulfa Antibiotics Swelling    hands    Antimicrobials this admission: 3/8 Vancomycin>> 3/8 cefepime>>  Thank you for allowing pharmacy to be a part of this patient's care.  Maryanna Shape, PharmD, BCPS  Clinical Pharmacist  Pager: [phone]    02/27/2016 1:52 PM

## 2016-02-27 NOTE — Progress Notes (Signed)
PROGRESS NOTE  [name] [name] [id number] DOB: [birth year] DOA: 02/26/2016 PCP: Haywood Pao, MD  HPI/Recap of past 24 hours:  Mildly demented gentleman , reported cough, denies chest pain , wife in room  Assessment/Plan: Principal Problem:   Pneumonia Active Problems:   HYPERTENSION, BENIGN   Atrial fibrillation with RVR ([place])   Dyspnea   Fall   Acute renal failure superimposed on stage 3 chronic kidney disease ([place])   Hyperglycemia   BPH (benign prostatic hypertrophy)   Diabetes mellitus without complication ([place])   Acute respiratory failure with hypoxia ([place])   HCAP (healthcare-associated pneumonia)   Elevated troponin  Acute respiratory failure with hypoxia due to Healthcare associated pneumonia .  He is being readmitted after being discharged in 02/07/16 For sepsis due to pneumonia. Chest x-ray shows left lower lobe infiltrate, consistent with pneumonia .Labs, including blood cultures are pending. Lactic acid normal at 1.23. He was placed on IV vancomycin and Maxipime. Tmax is 100.5. Osats Are 94% in 4l (was 88 in RA)  -negative flu prc, , negative Strep pneumo urine antigen, negative Legionella urine antigen -Oxygen supplementation and wean as able -Nebulizers every 6 hours, q 4 prn Symbicort BID -will get ct chest to further eval for pna.  Elevation of troponin in the setting of sepsis: denies chest pain, cardiology consulted, conservative management recommended.  Leukocytosis, likely related to underlying infection and recent steroids. Currently 14.1 Cultures pending - abx as above - Repeat WBC in AM  Acute on Chronic diastolic heart failure in the setting of AFib, LLL PNA , peripheral edema,  Elevated Troponin 1.04, EKG with Afib without any new changes QTC 430. BNP 171.Lactate 1.23. Last 2-D echo 01/31/16 showed normal systolic function, with ejection fraction 55-60%, unable to assess diastolic dysfunction due to atrial fibrillation.  Daily  weights, strict I/O Patient was not on diuretics at home, start iv lasix this admission, will need to discharge on lasix likely, Continue home blood pressure medications : toptol xl,  D/c norvasc due to significant lower extremity edema Cardiology consulted, input appreciated  Chronic kidney disease stage III, baseline creatinine1.5 Today at 1.54 - Minimize nephrotoxins and renally dose medications BMET in am  DMII in the setting of recent steroids currently with blood glucose at 186. A1C was 8.3 on 2/8, new diagnosis, diabetes education. SSI   Chronic afib; CHADSvasc scoreat least 5 ( age 38, htn30, dm63, chf64), per cardiology outpatient's notes, patient is not compliant with meds including xarelto, so he has been on asa 325 . Rate controlled on metoprolol.   Code Status: full  Family Communication: patient and wife  Disposition Plan: remain in patient   Consultants:  cardiology  Procedures:  none  Antibiotics:  Vanc/cefepime from admission   Objective: BP 117/75 mmHg  Pulse 103  Temp(Src) 98 F (36.7 C) (Oral)  Resp 20  Ht 5\' 5"  (1.651 m)  Wt 94.3 kg (207 lb 14.3 oz)  BMI 34.60 kg/m2  SpO2 90%  Intake/Output Summary (Last 24 hours) at 02/27/16 1058 Last data filed at 02/27/16 0900  Gross per 24 hour  Intake    720 ml  Output   2525 ml  Net  -1805 ml   Filed Weights   02/27/16 0030  Weight: 94.3 kg (207 lb 14.3 oz)    Exam:   General:  NAD  Cardiovascular: IRRR  Respiratory: crackles at basis  Abdomen: Soft/ND/NT, positive BS  Musculoskeletal: bilateral pitting Edema up to mid calf, chronic venous stasis skin changes  Neuro: know it is 2017, but not able to states the months, likely baseline  Data Reviewed: Basic Metabolic Panel:  Recent Labs Lab 02/26/16 1359 02/26/16 2023  NA 134*  --   K 4.0  --   CL 99*  --   CO2 24  --   GLUCOSE 186*  --   BUN 17  --   CREATININE 1.54* 1.66*  CALCIUM 8.9  --    Liver Function Tests: No  results for input(s): AST, ALT, ALKPHOS, BILITOT, PROT, ALBUMIN in the last 168 hours. No results for input(s): LIPASE, AMYLASE in the last 168 hours. No results for input(s): AMMONIA in the last 168 hours. CBC:  Recent Labs Lab 02/26/16 1359 02/26/16 2023  WBC 14.7* 14.4*  NEUTROABS 11.8*  --   HGB 13.5 13.4  HCT 39.6 39.8  MCV 95.9 96.1  PLT 220 222   Cardiac Enzymes:    Recent Labs Lab 02/26/16 1359 02/26/16 2151 02/27/16 0458  TROPONINI 1.04* 1.01* 1.13*   BNP (last 3 results)  Recent Labs  04/21/15 0524 02/26/16 1359  BNP 83.1 171.0*    ProBNP (last 3 results) No results for input(s): PROBNP in the last 8760 hours.  CBG:  Recent Labs Lab 02/26/16 1849 02/26/16 1956 02/26/16 2302 02/27/16 0632  GLUCAP 187* 173* 237* 189*    No results found for this or any previous visit (from the past 240 hour(s)).   Studies: Dg Chest 2 View  02/27/2016  CLINICAL DATA:  Shortness of Breath EXAM: CHEST  2 VIEW COMPARISON:  02/26/2016 FINDINGS: Cardiac shadow is stable. The lungs are well aerated bilaterally. Mild left basilar infiltrate is again identified. The overall inspiratory effort is poor with crowding of the vascular markings. Aortic calcifications are again seen. No bony abnormality is noted. IMPRESSION: Stable left lower lobe infiltrate. Electronically Signed   By: Inez Catalina M.D.   On: 02/27/2016 08:20   Dg Chest 2 View  02/26/2016  CLINICAL DATA:  Shortness of breath and cough EXAM: CHEST  2 VIEW COMPARISON:  01/31/2016 FINDINGS: Cardiac shadow within normal limits. The lungs are well aerated bilaterally. Left lower lobe infiltrate are noted. No acute bony abnormality is seen. IMPRESSION: Left lower lobe infiltrate. Electronically Signed   By: Inez Catalina M.D.   On: 02/26/2016 14:38    Scheduled Meds: . albuterol  2.5 mg Nebulization TID  . amLODipine  5 mg Oral Daily  . aspirin EC  325 mg Oral Daily  . ceFEPime (MAXIPIME) IV  1 g Intravenous Q24H  .  donepezil  5 mg Oral Daily  . finasteride  5 mg Oral Daily  . furosemide  40 mg Intravenous BID  . guaiFENesin  1,200 mg Oral BID  . heparin  5,000 Units Subcutaneous 3 times per day  . insulin aspart  0-9 Units Subcutaneous TID WC  . metoprolol succinate  50 mg Oral QHS  . sodium chloride flush  3 mL Intravenous Q12H  . vancomycin  1,500 mg Intravenous Q48H    Continuous Infusions:    Time spent: 28mins  Dagan Heinz MD, PhD  [facility] Pager [phone]. If 7PM-7AM, please contact night-coverage at [email or web address], password [facility] 02/27/2016, 10:58 AM  LOS: 1 day

## 2016-02-27 NOTE — Progress Notes (Signed)
Respiratory Nasal swab performed.

## 2016-02-27 NOTE — Progress Notes (Signed)
Patient has very very strong cough and is coughing very frequently. Notified hospitalist. Orders given for PRN cough medicine. Will continue to monitor patient to end of shift.

## 2016-02-27 NOTE — Progress Notes (Signed)
Patient ID: [name] [name], male   DOB: 26-Dec-1925, 82 y.o.   MRN: [id number]    Subjective:  Denies SSCP, palpitations or Dyspnea Has suction for sputum   Objective:  Filed Vitals:   02/26/16 1945 02/26/16 2000 02/27/16 0030 02/27/16 0513  BP: 108/70 120/79 117/61 102/66  Pulse: 94 81  78  Temp:   99.3 F (37.4 C) 99.4 F (37.4 C)  TempSrc:   Oral Oral  Resp: 24 22 20 21   Height:   5\' 5"  (1.651 m)   Weight:   94.3 kg (207 lb 14.3 oz)   SpO2: 94% 94% 92% 95%    Intake/Output from previous day:  Intake/Output Summary (Last 24 hours) at 02/27/16 0753 Last data filed at 02/27/16 [id number]  Gross per 24 hour  Intake    480 ml  Output   2525 ml  Net  -2045 ml    Physical Exam: Affect appropriate Elderly white male  HEENT: normal Neck supple with no adenopathy JVP normal no bruits no thyromegaly Lungs clear with no wheezing and good diaphragmatic motion Heart:  S1/S2 no murmur, no rub, gallop or click PMI normal Abdomen: benighn, BS positve, no tenderness, no AAA no bruit.  No HSM or HJR Distal pulses intact with no bruits Plus 2 bilateral  Edema with stasis on left  Neuro non-focal Skin warm and dry No muscular weakness   Lab Results: Basic Metabolic Panel:  Recent Labs  02/26/16 1359 02/26/16 2023  NA 134*  --   K 4.0  --   CL 99*  --   CO2 24  --   GLUCOSE 186*  --   BUN 17  --   CREATININE 1.54* 1.66*  CALCIUM 8.9  --    CBC:  Recent Labs  02/26/16 1359 02/26/16 2023  WBC 14.7* 14.4*  NEUTROABS 11.8*  --   HGB 13.5 13.4  HCT 39.6 39.8  MCV 95.9 96.1  PLT 220 222   Cardiac Enzymes:  Recent Labs  02/26/16 1359 02/26/16 2151 02/27/16 0458  TROPONINI 1.04* 1.01* 1.13*    Imaging: Dg Chest 2 View  02/26/2016  CLINICAL DATA:  Shortness of breath and cough EXAM: CHEST  2 VIEW COMPARISON:  01/31/2016 FINDINGS: Cardiac shadow within normal limits. The lungs are well aerated bilaterally. Left lower lobe infiltrate are noted. No acute bony  abnormality is seen. IMPRESSION: Left lower lobe infiltrate. Electronically Signed   By: Inez Catalina M.D.   On: 02/26/2016 14:38    Cardiac Studies:  ECG:  afib normal ST/T waves    Telemetry: afib   02/27/2016   Echo: 01/31/16  EF 55-60% reviewed   Medications:   . albuterol  2.5 mg Nebulization TID  . amLODipine  5 mg Oral Daily  . aspirin EC  325 mg Oral Daily  . ceFEPime (MAXIPIME) IV  1 g Intravenous Q24H  . donepezil  5 mg Oral Daily  . finasteride  5 mg Oral Daily  . furosemide  40 mg Intravenous BID  . guaiFENesin  1,200 mg Oral BID  . heparin  5,000 Units Subcutaneous 3 times per day  . insulin aspart  0-9 Units Subcutaneous TID WC  . metoprolol succinate  50 mg Oral QHS  . sodium chloride flush  3 mL Intravenous Q12H  . vancomycin  1,500 mg Intravenous Q48H       Assessment/Plan:  Elevated Troponin:  Post fall no chest pain or ECG changes no indication for further w/u Afib:  No  anticoagulation given age and falls.  On metoprolol for rate control Pneumonia:  Recurrent LLL infiltrate on maxipime plan per primary service Edema:  Chronic venous insuf.  Continue lasix follow BMET  Jenkins Rouge 02/27/2016, 7:53 AM

## 2016-02-27 NOTE — Progress Notes (Signed)
Inpatient Diabetes Program Recommendations  AACE/ADA: New Consensus Statement on Inpatient Glycemic Control (2015)  Target Ranges:  Prepandial:   less than 140 mg/dL      Peak postprandial:   less than 180 mg/dL (1-2 hours)      Critically ill patients:  140 - 180 mg/dL   Results for KARLON, RUMBLEY (MRN [id number]) as of 02/27/2016 11:02  Ref. Range 02/26/2016 18:49 02/26/2016 19:56 02/26/2016 23:02 02/27/2016 06:32  Glucose-Capillary Latest Ref Range: 65-99 mg/dL 187 (H) 173 (H) 237 (H) 189 (H)   Results for AVONTA, FOUTY (MRN [id number]) as of 02/27/2016 11:02  Ref. Range 01/29/2016 03:42  Hemoglobin A1C Latest Ref Range: 4.8-5.6 % 8.3 (H)    Admit with: Pneumonia  History: DM, HTN  Home DM Meds: None (diet controlled)  Current Insulin Orders: Novolog Sensitive Correction Scale/ SSI (0-9 units) TID AC     -Note per H&P note from Dr. Blaine Hamper on 01/28/16 that patient has History of DM.  Was discharged home on 02/02/16 by Dr. Algis Liming but was not started on any DM medications.    -Current A1c drawn back in February was 8.3%.  -Agree with current Insulin Orders.    -Have ordered Living Well with Diabetes booklet for this patient.    --Will follow patient during hospitalization--  Wyn Quaker RN, MSN, CDE Diabetes Coordinator Inpatient Glycemic Control Team Team Pager: [phone] (8a-5p)

## 2016-02-28 DIAGNOSIS — E119 Type 2 diabetes mellitus without complications: Secondary | ICD-10-CM | POA: Insufficient documentation

## 2016-02-28 DIAGNOSIS — N183 Chronic kidney disease, stage 3 unspecified: Secondary | ICD-10-CM | POA: Insufficient documentation

## 2016-02-28 DIAGNOSIS — N179 Acute kidney failure, unspecified: Secondary | ICD-10-CM

## 2016-02-28 DIAGNOSIS — E1122 Type 2 diabetes mellitus with diabetic chronic kidney disease: Secondary | ICD-10-CM | POA: Insufficient documentation

## 2016-02-28 LAB — CBC
HEMATOCRIT: 37.8 % — AB (ref 39.0–52.0)
HEMOGLOBIN: 12.4 g/dL — AB (ref 13.0–17.0)
MCH: 31.2 pg (ref 26.0–34.0)
MCHC: 32.8 g/dL (ref 30.0–36.0)
MCV: 95.2 fL (ref 78.0–100.0)
Platelets: 213 10*3/uL (ref 150–400)
RBC: 3.97 MIL/uL — AB (ref 4.22–5.81)
RDW: 13.2 % (ref 11.5–15.5)
WBC: 10.5 10*3/uL (ref 4.0–10.5)

## 2016-02-28 LAB — HEMOGLOBIN A1C
Hgb A1c MFr Bld: 8.6 % — ABNORMAL HIGH (ref 4.8–5.6)
MEAN PLASMA GLUCOSE: 200 mg/dL

## 2016-02-28 LAB — GLUCOSE, CAPILLARY
GLUCOSE-CAPILLARY: 233 mg/dL — AB (ref 65–99)
GLUCOSE-CAPILLARY: 185 mg/dL — AB (ref 65–99)
GLUCOSE-CAPILLARY: 170 mg/dL — AB (ref 65–99)
Glucose-Capillary: 148 mg/dL — ABNORMAL HIGH (ref 65–99)

## 2016-02-28 LAB — BASIC METABOLIC PANEL
ANION GAP: 12 (ref 5–15)
BUN: 25 mg/dL — ABNORMAL HIGH (ref 6–20)
CHLORIDE: 94 mmol/L — AB (ref 101–111)
CO2: 29 mmol/L (ref 22–32)
Calcium: 8.3 mg/dL — ABNORMAL LOW (ref 8.9–10.3)
Creatinine, Ser: 1.83 mg/dL — ABNORMAL HIGH (ref 0.61–1.24)
GFR, EST AFRICAN AMERICAN: 36 mL/min — AB (ref >60)
GFR, EST NON AFRICAN AMERICAN: 31 mL/min — AB (ref >60)
Glucose, Bld: 167 mg/dL — ABNORMAL HIGH (ref 65–99)
POTASSIUM: 3 mmol/L — AB (ref 3.5–5.1)
SODIUM: 135 mmol/L (ref 135–145)

## 2016-02-28 LAB — CBC AND DIFFERENTIAL: WBC: 10.5 10^3/mL

## 2016-02-28 MED ORDER — POTASSIUM CHLORIDE CRYS ER 20 MEQ PO TBCR
40.0000 meq | EXTENDED_RELEASE_TABLET | Freq: Once | ORAL | Status: AC
  Administered 2016-02-28: 40 meq via ORAL
  Filled 2016-02-28: qty 2

## 2016-02-28 MED ORDER — FUROSEMIDE 10 MG/ML IJ SOLN
40.0000 mg | Freq: Every day | INTRAMUSCULAR | Status: DC
  Administered 2016-02-29 – 2016-03-02 (×3): 40 mg via INTRAVENOUS
  Filled 2016-02-28 (×4): qty 4

## 2016-02-28 MED ORDER — IPRATROPIUM-ALBUTEROL 0.5-2.5 (3) MG/3ML IN SOLN
3.0000 mL | Freq: Three times a day (TID) | RESPIRATORY_TRACT | Status: DC
Start: 2016-02-28 — End: 2016-02-29
  Administered 2016-02-28: 3 mL via RESPIRATORY_TRACT
  Filled 2016-02-28: qty 3

## 2016-02-28 MED ORDER — ACETAMINOPHEN 325 MG PO TABS
650.0000 mg | ORAL_TABLET | Freq: Four times a day (QID) | ORAL | Status: DC | PRN
  Administered 2016-02-28 – 2016-03-01 (×2): 650 mg via ORAL
  Filled 2016-02-28 (×2): qty 2

## 2016-02-28 NOTE — Evaluation (Signed)
Clinical/Bedside Swallow Evaluation Patient Details  Name: [name] [name] MRN: [id number] Date of Birth: 18-Apr-1926  Today's Date: 02/28/2016 Time: SLP Start Time (ACUTE ONLY): 0850 SLP Stop Time (ACUTE ONLY): 0917 SLP Time Calculation (min) (ACUTE ONLY): 27 min  Past Medical History:  Past Medical History  Diagnosis Date  . Hypertension   . Hyperlipidemia   . Chronic atrial fibrillation ([place])   . Urinary retention foley last changed 3 weeks ago  . Arthritis   . Sleep apnea     no cpap used, sleep study was several yrs ago  . Diabetes mellitus without complication [facility])    Past Surgical History:  Past Surgical History  Procedure Laterality Date  . Back surgery  yrs ago    lower back surgery  . Tonsillectomy  as child  . Prostate surgery  yrs ago, no cancer  . Transurethral resection of prostate  02/22/2012    Procedure: TRANSURETHRAL RESECTION OF THE PROSTATE WITH GYRUS INSTRUMENTS;  Surgeon: Molli Hazard, MD;  Location: [facility];  Service: Urology;  Laterality: N/A;       HPI:  [name] [name] is a 82 y.o. male with a history of diastolic heart failure, stage IIIc KD, atrial fibrillation on Xarelto, not on CPAP, recently hospitalized in 1/16- 01/2016 for sepsis in the setting of presumed pneumonia, completing a course of Augmentin as outpatient, presenting to the ED with increasing dyspnea, and generalized weakness. He was working with his PT on a couch, when he became weak and short of breath, sustaining a fall. He did not hit his head.  Chest x-ray shows left lower lobe infiltrate, consistent with pneumonia    Assessment / Plan / Recommendation Clinical Impression  Pt demonstrates no subjective evidence of aspiration with a meal. Pt was coughing prior to meal but did not cough with any PO during the meal. Of note, the sound of pts baseline cough is very concerning; question abnormality of the tracheal or bronchial structures. No SLP f/u needed at this time. Will sign  off.     Aspiration Risk  Mild aspiration risk    Diet Recommendation Regular;Thin liquid   Liquid Administration via: Cup;Straw Medication Administration: Whole meds with liquid Supervision: Patient able to self feed Compensations: Slow rate;Small sips/bites Postural Changes: Seated upright at 90 degrees    Other  Recommendations     Follow up Recommendations  None    Frequency and Duration            Prognosis        Swallow Study   General HPI: [name] [name] is a 82 y.o. male with a history of diastolic heart failure, stage IIIc KD, atrial fibrillation on Xarelto, not on CPAP, recently hospitalized in 1/16- 01/2016 for sepsis in the setting of presumed pneumonia, completing a course of Augmentin as outpatient, presenting to the ED with increasing dyspnea, and generalized weakness. He was working with his PT on a couch, when he became weak and short of breath, sustaining a fall. He did not hit his head.  Chest x-ray shows left lower lobe infiltrate, consistent with pneumonia  Type of Study: Bedside Swallow Evaluation Previous Swallow Assessment: BSE, WNL in 2/17 Diet Prior to this Study: Regular;Thin liquids Temperature Spikes Noted: No Respiratory Status: Nasal cannula History of Recent Intubation: No Behavior/Cognition: Alert;Cooperative;Pleasant mood Oral Cavity Assessment: Within Functional Limits Oral Care Completed by SLP: No Oral Cavity - Dentition: Missing dentition;Adequate natural dentition Vision: Functional for self-feeding Self-Feeding Abilities: Able to feed  self;Needs assist Patient Positioning: Upright in bed Baseline Vocal Quality: Wet (and slightly dysphonic at baseline) Volitional Swallow: Able to elicit    Oral/Motor/Sensory Function Overall Oral Motor/Sensory Function: Within functional limits   Ice Chips     Thin Liquid Thin Liquid: Within functional limits Presentation: Cup;Straw    Nectar Thick Nectar Thick Liquid: Not tested   Honey Thick  Honey Thick Liquid: Not tested   Puree Puree: Within functional limits Presentation: Self Fed   Solid   GO   Solid: Within functional limits       [facility], MA CCC-SLP [id number]  Lynann Beaver 02/28/2016,11:43 AM

## 2016-02-28 NOTE — Progress Notes (Signed)
Patient ID: [name] [name], male   DOB: 08-25-1926, 82 y.o.   MRN: [id number]    Subjective:  Lethargic   Good output   Objective:  Filed Vitals:   02/27/16 0921 02/27/16 1218 02/27/16 2300 02/28/16 0521  BP: 117/75 110/69 113/58 101/58  Pulse: 103 88 102 104  Temp: 98 F (36.7 C) 97.6 F (36.4 C) 98.6 F (37 C) 99.2 F (37.3 C)  TempSrc: Oral Oral Oral Oral  Resp: 20 18 20 21   Height:      Weight:    95.2 kg (209 lb 14.1 oz)  SpO2: 90% 94% 93% 94%    Intake/Output from previous day:  Intake/Output Summary (Last 24 hours) at 02/28/16 0817 Last data filed at 02/28/16 0600  Gross per 24 hour  Intake   1050 ml  Output   1975 ml  Net   -925 ml    Physical Exam: Affect appropriate Elderly white male  HEENT: normal Neck supple with no adenopathy JVP normal no bruits no thyromegaly Lungs clear with no wheezing and good diaphragmatic motion Heart:  S1/S2 no murmur, no rub, gallop or click PMI normal Abdomen: benighn, BS positve, no tenderness, no AAA no bruit.  No HSM or HJR Distal pulses intact with no bruits Plus 2 bilateral  Edema with stasis on left  Neuro non-focal Skin warm and dry No muscular weakness   Lab Results: Basic Metabolic Panel:  Recent Labs  02/26/16 1359 02/26/16 2023 02/28/16 0420  NA 134*  --  135  K 4.0  --  3.0*  CL 99*  --  94*  CO2 24  --  29  GLUCOSE 186*  --  167*  BUN 17  --  25*  CREATININE 1.54* 1.66* 1.83*  CALCIUM 8.9  --  8.3*   CBC:  Recent Labs  02/26/16 1359 02/26/16 2023 02/28/16 0420  WBC 14.7* 14.4* 10.5  NEUTROABS 11.8*  --   --   HGB 13.5 13.4 12.4*  HCT 39.6 39.8 37.8*  MCV 95.9 96.1 95.2  PLT 220 222 213   Cardiac Enzymes:  Recent Labs  02/26/16 1359 02/26/16 2151 02/27/16 0458  TROPONINI 1.04* 1.01* 1.13*    Imaging: Dg Chest 2 View  02/27/2016  CLINICAL DATA:  Shortness of Breath EXAM: CHEST  2 VIEW COMPARISON:  02/26/2016 FINDINGS: Cardiac shadow is stable. The lungs are well aerated  bilaterally. Mild left basilar infiltrate is again identified. The overall inspiratory effort is poor with crowding of the vascular markings. Aortic calcifications are again seen. No bony abnormality is noted. IMPRESSION: Stable left lower lobe infiltrate. Electronically Signed   By: Inez Catalina M.D.   On: 02/27/2016 08:20   Dg Chest 2 View  02/26/2016  CLINICAL DATA:  Shortness of breath and cough EXAM: CHEST  2 VIEW COMPARISON:  01/31/2016 FINDINGS: Cardiac shadow within normal limits. The lungs are well aerated bilaterally. Left lower lobe infiltrate are noted. No acute bony abnormality is seen. IMPRESSION: Left lower lobe infiltrate. Electronically Signed   By: Inez Catalina M.D.   On: 02/26/2016 14:38   Ct Chest Wo Contrast  02/27/2016  CLINICAL DATA:  Pneumonia. EXAM: CT CHEST WITHOUT CONTRAST TECHNIQUE: Multidetector CT imaging of the chest was performed following the standard protocol without IV contrast. COMPARISON:  Chest x-ray 02/27/2016, chest CT 06/08/2005 FINDINGS: Heart: There is calcifications the coronary arteries. No pericardial effusion. Heart size is normal. Vascular structures: Tortuous great vessels. Thoracic aorta is not aneurysmal. Mediastinum/thyroid: The visualized portion of  the thyroid gland has a normal appearance. Numerous small mediastinal and hilar lymph nodes are present. These do not reach criteria for pathologic enlargement. Lungs/Airways: There are multiple pulmonary opacities, more confluent at the left lung base. Opacities are discrete and somewhat rounded in appearance in the upper lobes bilaterally, making it difficult to exclude superimposed metastatic disease as well as infectious process. Upper abdomen: The gallbladder is present. Left renal cyst is present. There is colonic diverticulosis. Chest wall/osseous structures: Multiple vertebral hemangiomata. No acute osseous abnormality. IMPRESSION: 1. Multifocal pulmonary opacities, consistent with infectious process.  Typical and atypical infections such as fungal infections and tuberculosis could have this appearance. 2. Because of the discrete, rounded appearance of numerous densities in the upper lobes, follow-up CT is recommended following clinical improvement in 3-6 months to exclude metastatic disease. 3. Small mediastinal and hilar lymph nodes may be reactive. 4. Coronary artery disease. 5. Left renal cyst. 6. Colonic diverticulosis. Electronically Signed   By: Nolon Nations M.D.   On: 02/27/2016 21:36    Cardiac Studies:  ECG:  afib normal ST/T waves    Telemetry: afib   02/28/2016   Echo: 01/31/16  EF 55-60% reviewed   Medications:   . albuterol  2.5 mg Nebulization TID  . aspirin EC  325 mg Oral Daily  . atorvastatin  20 mg Oral q1800  . ceFEPime (MAXIPIME) IV  1 g Intravenous Q24H  . donepezil  5 mg Oral Daily  . finasteride  5 mg Oral Daily  . furosemide  40 mg Intravenous BID  . guaiFENesin  1,200 mg Oral BID  . heparin  5,000 Units Subcutaneous 3 times per day  . insulin aspart  0-9 Units Subcutaneous TID WC  . metoprolol succinate  50 mg Oral QHS  . sodium chloride flush  3 mL Intravenous Q12H  . vancomycin  1,000 mg Intravenous Q24H       Assessment/Plan:  Elevated Troponin:  Post fall no chest pain or ECG changes no indication for further w/u Afib:  No anticoagulation given age and falls.  On metoprolol for rate control Pneumonia:  Recurrent LLL infiltrate on vanc and maxipime  plan per primary service Edema:  Chronic venous insuf.  Cr elevating decrease lasix to 40 iv daily   Jenkins Rouge 02/28/2016, 8:17 AM

## 2016-02-28 NOTE — Progress Notes (Signed)
PROGRESS NOTE  [name] [name] [id number] DOB: 11/03/1926 DOA: 02/26/2016 PCP: Haywood Pao, MD  HPI/Recap of past 24 hours:  Mildly demented gentleman , reported feeling better, less cough, denies chest pain , less edema in legs , wife in room  Assessment/Plan: Principal Problem:   Pneumonia Active Problems:   HYPERTENSION, BENIGN   Atrial fibrillation with RVR ([place])   Dyspnea   Fall   Acute renal failure superimposed on stage 3 chronic kidney disease ([place])   Hyperglycemia   BPH (benign prostatic hypertrophy)   Diabetes mellitus without complication ([place])   Acute respiratory failure with hypoxia ([place])   HCAP (healthcare-associated pneumonia)   Elevated troponin  Acute respiratory failure with hypoxia due to Healthcare associated pneumonia .  He is being readmitted after being discharged in 02/07/16 For sepsis due to pneumonia. Chest x-ray shows left lower lobe infiltrate, consistent with pneumonia .Labs, including blood cultures are pending. Lactic acid normal at 1.23. He was placed on IV vancomycin and Maxipime. Tmax is 100.5. Osats Are 94% in 4l (was 88 in RA)  -negative flu prc, , negative Strep pneumo urine antigen, negative Legionella urine antigen -Oxygen supplementation and wean as able -Nebulizers every 6 hours, q 4 prn Symbicort BID - ct chest with multifocal opacities, continue abx/nebs  Elevation of troponin in the setting of sepsis: denies chest pain, cardiology consulted, conservative management recommended.  Leukocytosis, likely related to underlying infection and recent steroids. Currently 14.1 Cultures pending - abx as above - wbc normalized on 3/10  Acute on Chronic diastolic heart failure in the setting of AFib, LLL PNA , peripheral edema,  Elevated Troponin 1.04, EKG with Afib without any new changes QTC 430. BNP 171.Lactate 1.23. Last 2-D echo 01/31/16 showed normal systolic function, with ejection fraction 55-60%, unable to assess  diastolic dysfunction due to atrial fibrillation.  Daily weights, strict I/O Patient was not on diuretics at home, start iv lasix this admission, will need to discharge on lasix likely, Continue home blood pressure medications : toptol xl,  D/c norvasc due to significant lower extremity edema Cardiology consulted, input appreciated, lasix dose per cardiology  Chronic kidney disease stage III, baseline creatinine1.5 Today at 1.54 - Minimize nephrotoxins and renally dose medications  DMII in the setting of recent steroids currently with blood glucose at 186. A1C was 8.3 on 2/8, new diagnosis, diabetes education. SSI   Chronic afib; CHADSvasc scoreat least 5 ( age 5, htn68, dm60, chf57), per cardiology outpatient's notes, patient is not compliant with meds including xarelto, so he has been on asa 325 . Rate controlled on metoprolol.  Hypokalemia; replace k   Code Status: full  Family Communication: patient and wife  Disposition Plan: remain in patient, will need SNF at discharge   Consultants:  cardiology  Procedures:  none  Antibiotics:  Vanc/cefepime from admission   Objective: BP 100/57 mmHg  Pulse 87  Temp(Src) 98.1 F (36.7 C) (Oral)  Resp 20  Ht 5\' 5"  (1.651 m)  Wt 95.2 kg (209 lb 14.1 oz)  BMI 34.93 kg/m2  SpO2 90%  Intake/Output Summary (Last 24 hours) at 02/28/16 1803 Last data filed at 02/28/16 0600  Gross per 24 hour  Intake    540 ml  Output   1175 ml  Net   -635 ml   Filed Weights   02/27/16 0030 02/28/16 0521  Weight: 94.3 kg (207 lb 14.3 oz) 95.2 kg (209 lb 14.1 oz)    Exam:   General:  NAD  Cardiovascular: IRRR  Respiratory: crackles at basis  Abdomen: Soft/ND/NT, positive BS  Musculoskeletal: bilateral pitting Edema up to mid calf resolving, chronic venous stasis skin changes  Neuro: know it is 2017, but not able to states the months, likely baseline  Data Reviewed: Basic Metabolic Panel:  Recent Labs Lab 02/26/16 1359  02/26/16 2023 02/28/16 0420  NA 134*  --  135  K 4.0  --  3.0*  CL 99*  --  94*  CO2 24  --  29  GLUCOSE 186*  --  167*  BUN 17  --  25*  CREATININE 1.54* 1.66* 1.83*  CALCIUM 8.9  --  8.3*   Liver Function Tests:  Recent Labs Lab 02/27/16 1106  AST 22  ALT 29  ALKPHOS 68  BILITOT 0.6  PROT 6.7  ALBUMIN 2.5*   No results for input(s): LIPASE, AMYLASE in the last 168 hours. No results for input(s): AMMONIA in the last 168 hours. CBC:  Recent Labs Lab 02/26/16 1359 02/26/16 2023 02/28/16 0420  WBC 14.7* 14.4* 10.5  NEUTROABS 11.8*  --   --   HGB 13.5 13.4 12.4*  HCT 39.6 39.8 37.8*  MCV 95.9 96.1 95.2  PLT 220 222 213   Cardiac Enzymes:    Recent Labs Lab 02/26/16 1359 02/26/16 2151 02/27/16 0458  TROPONINI 1.04* 1.01* 1.13*   BNP (last 3 results)  Recent Labs  04/21/15 0524 02/26/16 1359  BNP 83.1 171.0*    ProBNP (last 3 results) No results for input(s): PROBNP in the last 8760 hours.  CBG:  Recent Labs Lab 02/27/16 1641 02/27/16 2339 02/28/16 0610 02/28/16 1153 02/28/16 1653  GLUCAP 182* 176* 148* 233* 185*    Recent Results (from the past 240 hour(s))  Culture, blood (routine x 2) Call MD if unable to obtain prior to antibiotics being given     Status: None (Preliminary result)   Collection Time: 02/26/16  8:23 PM  Result Value Ref Range Status   Specimen Description BLOOD RIGHT ANTECUBITAL  Final   Special Requests BOTTLES DRAWN AEROBIC AND ANAEROBIC 5CC EACH  Final   Culture NO GROWTH 2 DAYS  Final   Report Status PENDING  Incomplete  Culture, blood (routine x 2) Call MD if unable to obtain prior to antibiotics being given     Status: None (Preliminary result)   Collection Time: 02/26/16  8:30 PM  Result Value Ref Range Status   Specimen Description BLOOD RIGHT WRIST  Final   Special Requests BOTTLES DRAWN AEROBIC AND ANAEROBIC 5CC BOTH  Final   Culture NO GROWTH 2 DAYS  Final   Report Status PENDING  Incomplete  MRSA PCR  Screening     Status: None   Collection Time: 02/27/16  3:42 PM  Result Value Ref Range Status   MRSA by PCR NEGATIVE NEGATIVE Final    Comment:        The GeneXpert MRSA Assay (FDA approved for NASAL specimens only), is one component of a comprehensive MRSA colonization surveillance program. It is not intended to diagnose MRSA infection nor to guide or monitor treatment for MRSA infections.      Studies: Ct Chest Wo Contrast  02/27/2016  CLINICAL DATA:  Pneumonia. EXAM: CT CHEST WITHOUT CONTRAST TECHNIQUE: Multidetector CT imaging of the chest was performed following the standard protocol without IV contrast. COMPARISON:  Chest x-ray 02/27/2016, chest CT 06/08/2005 FINDINGS: Heart: There is calcifications the coronary arteries. No pericardial effusion. Heart size is normal. Vascular structures: Tortuous great vessels.  Thoracic aorta is not aneurysmal. Mediastinum/thyroid: The visualized portion of the thyroid gland has a normal appearance. Numerous small mediastinal and hilar lymph nodes are present. These do not reach criteria for pathologic enlargement. Lungs/Airways: There are multiple pulmonary opacities, more confluent at the left lung base. Opacities are discrete and somewhat rounded in appearance in the upper lobes bilaterally, making it difficult to exclude superimposed metastatic disease as well as infectious process. Upper abdomen: The gallbladder is present. Left renal cyst is present. There is colonic diverticulosis. Chest wall/osseous structures: Multiple vertebral hemangiomata. No acute osseous abnormality. IMPRESSION: 1. Multifocal pulmonary opacities, consistent with infectious process. Typical and atypical infections such as fungal infections and tuberculosis could have this appearance. 2. Because of the discrete, rounded appearance of numerous densities in the upper lobes, follow-up CT is recommended following clinical improvement in 3-6 months to exclude metastatic disease. 3.  Small mediastinal and hilar lymph nodes may be reactive. 4. Coronary artery disease. 5. Left renal cyst. 6. Colonic diverticulosis. Electronically Signed   By: Nolon Nations M.D.   On: 02/27/2016 21:36    Scheduled Meds: . albuterol  2.5 mg Nebulization TID  . aspirin EC  325 mg Oral Daily  . atorvastatin  20 mg Oral q1800  . ceFEPime (MAXIPIME) IV  1 g Intravenous Q24H  . donepezil  5 mg Oral Daily  . finasteride  5 mg Oral Daily  . [START ON 02/29/2016] furosemide  40 mg Intravenous Daily  . guaiFENesin  1,200 mg Oral BID  . heparin  5,000 Units Subcutaneous 3 times per day  . insulin aspart  0-9 Units Subcutaneous TID WC  . metoprolol succinate  50 mg Oral QHS  . sodium chloride flush  3 mL Intravenous Q12H  . vancomycin  1,000 mg Intravenous Q24H    Continuous Infusions:    Time spent: 26mins  Riaan Toledo MD, PhD  [facility] Pager [phone]. If 7PM-7AM, please contact night-coverage at [email or web address], password [facility] 02/28/2016, 6:03 PM  LOS: 2 days

## 2016-02-28 NOTE — Clinical Documentation Improvement (Signed)
Hospitalist  Can the diagnosis of systemic infection be further specified? Please document if sepsis is present on admission in the progress notes.    Sepsis - specify causative organism if known  Determine if there is Severe Sepsis (Sepsis with organ dysfunction - specify), Septic Shock if present  Identify etiology of Sepsis - Device, Implant, Graft, Infusion, Abortion  Specify organ dysfunction - Respiratory Failure, Encephalopathy, Acute Kidney Failure, Pneumonia, UTI, Other (specify), Unable to Clinically Determine}  Other  Clinically Undetermined  Document any associated diagnoses/conditions.   Supporting Information: Elevation of troponin in the setting of sepsis per 3/09 progress notes. Recently hospitalized 01/2016 for sepsis due to community acquired pneumonia per 3/08 progress notes.  3/08: Lactic acid: 1.23.   Please exercise your independent, professional judgment when responding. A specific answer is not anticipated or expected.   Thank You,  [place] [phone]

## 2016-02-28 NOTE — Care Management Important Message (Signed)
Important Message  Patient Details  Name: [name] [name] MRN: [id number] Date of Birth: [birth year]   Medicare Important Message Given:  Yes    [name] [name] 02/28/2016, 12:56 PM

## 2016-02-28 NOTE — Evaluation (Signed)
Physical Therapy Evaluation Patient Details Name: [name] [name] MRN: [id number] DOB: [birth year] Today's Date: 02/28/2016   History of Present Illness  [name] [name] is a 82 y.o. male with a Past Medical History ofHTN, HLD, Afib, OSA, DM who presents with acute respiratory failure likely secondary to H CAP  Clinical Impression  Pt admitted with above diagnosis. Pt currently with functional limitations due to the deficits listed below (see PT Problem List). Unstable, requiring a rolling walker for support and min assist for balance. Poor cognition with following safety commands, and fatigues rapidly. SpO2 97% on room air. Would benefit from ST-SNF prior to returning home. Pt will benefit from skilled PT to increase their independence and safety with mobility to allow discharge to the venue listed below.       Follow Up Recommendations SNF;Supervision/Assistance - 24 hour    Equipment Recommendations  None recommended by PT    Recommendations for Other Services       Precautions / Restrictions Precautions Precautions: Fall Restrictions Weight Bearing Restrictions: No      Mobility  Bed Mobility               General bed mobility comments: sitting EOB when PT entered room  Transfers Overall transfer level: Needs assistance Equipment used: Rolling walker (2 wheeled) Transfers: Sit to/from Stand Sit to Stand: Min assist         General transfer comment: Min assist for boost and balance. Posterior loss of balance. Requires cues for anterior weight shift and hand placement. Practiced x2. Fair descent into chair.  Ambulation/Gait Ambulation/Gait assistance: Min assist;+2 safety/equipment Ambulation Distance (Feet): 20 Feet Assistive device: Rolling walker (2 wheeled) Gait Pattern/deviations: Step-through pattern;Decreased stride length;Shuffle;Trunk flexed Gait velocity: slow Gait velocity interpretation: <1.8 ft/sec, indicative of risk for recurrent  falls General Gait Details: Max VC for walker placement for proximity and to keep hands on RW. Easily distracted. Min assist for balance and RW control. No buckling noted. Pt fatigued rapidly, reaches for furniture and attempts to sit prematurely.  +2 follow with chair.  Stairs            Wheelchair Mobility    Modified Rankin (Stroke Patients Only)       Balance Overall balance assessment: Needs assistance;History of Falls Sitting-balance support: Feet supported;No upper extremity supported Sitting balance-Leahy Scale: Good     Standing balance support: Bilateral upper extremity supported Standing balance-Leahy Scale: Poor                               Pertinent Vitals/Pain Pain Assessment: No/denies pain    Home Living Family/patient expects to be discharged to:: Private residence Living Arrangements: Spouse/significant other Available Help at Discharge: Family;Available PRN/intermittently Type of Home: House Home Access: Stairs to enter   Entrance Stairs-Number of Steps: 1 Home Layout: One level Home Equipment: Walker - 2 wheels;Shower seat - built in Additional Comments: Pt poor historian, no family immediately available. Most information taken from prior notes.    Prior Function Level of Independence: Needs assistance   Gait / Transfers Assistance Needed: "wall walks" with wife nearby; just to and from bathroom typically  ADL's / Homemaking Assistance Needed: wife cooks cleans.  Comments: Pt poor historian, no family immediately available. Most information taken from prior notes.     Hand Dominance   Dominant Hand: Right    Extremity/Trunk Assessment   Upper Extremity Assessment: Defer to OT evaluation  Lower Extremity Assessment: Generalized weakness         Communication   Communication: HOH  Cognition Arousal/Alertness: Awake/alert Behavior During Therapy: Impulsive Overall Cognitive Status: No family/caregiver  present to determine baseline cognitive functioning Area of Impairment: Orientation;Following commands;Safety/judgement;Problem solving Orientation Level: Disoriented to;Time;Situation     Following Commands: Follows one step commands consistently Safety/Judgement: Decreased awareness of safety;Decreased awareness of deficits   Problem Solving: Difficulty sequencing;Requires verbal cues;Requires tactile cues      General Comments General comments (skin integrity, edema, etc.): Pleasantly confused. SpO2 97% on room air coughing frequently    Exercises        Assessment/Plan    PT Assessment Patient needs continued PT services  PT Diagnosis Difficulty walking;Abnormality of gait;Generalized weakness;Altered mental status   PT Problem List Decreased strength;Decreased activity tolerance;Decreased balance;Decreased mobility;Decreased cognition;Decreased knowledge of use of DME;Decreased safety awareness;Obesity  PT Treatment Interventions DME instruction;Gait training;Functional mobility training;Therapeutic activities;Therapeutic exercise;Balance training;Patient/family education;Cognitive remediation   PT Goals (Current goals can be found in the Care Plan section) Acute Rehab PT Goals Patient Stated Goal: get stronger PT Goal Formulation: Patient unable to participate in goal setting Time For Goal Achievement: 03/13/16 Potential to Achieve Goals: Fair    Frequency Min 3X/week   Barriers to discharge Decreased caregiver support likely needs higher level of care    Co-evaluation               End of Session Equipment Utilized During Treatment: Gait belt Activity Tolerance: Patient tolerated treatment well Patient left: in chair;with call bell/phone within reach;with chair alarm set Nurse Communication: Mobility status         Time: 1517-1530 PT Time Calculation (min) (ACUTE ONLY): 13 min   Charges:   PT Evaluation $PT Eval Moderate Complexity: 1 Procedure      PT G CodesEllouise Newer 02/28/2016, 4:10 PM  Camille Bal [place], [place]

## 2016-02-29 LAB — BASIC METABOLIC PANEL
Anion gap: 9 (ref 5–15)
BUN: 23 mg/dL — AB (ref 6–20)
CO2: 30 mmol/L (ref 22–32)
CREATININE: 1.68 mg/dL — AB (ref 0.61–1.24)
Calcium: 8.7 mg/dL — ABNORMAL LOW (ref 8.9–10.3)
Chloride: 100 mmol/L — ABNORMAL LOW (ref 101–111)
GFR, EST AFRICAN AMERICAN: 40 mL/min — AB (ref >60)
GFR, EST NON AFRICAN AMERICAN: 35 mL/min — AB (ref >60)
Glucose, Bld: 152 mg/dL — ABNORMAL HIGH (ref 65–99)
POTASSIUM: 3.7 mmol/L (ref 3.5–5.1)
SODIUM: 139 mmol/L (ref 135–145)

## 2016-02-29 LAB — GLUCOSE, CAPILLARY
GLUCOSE-CAPILLARY: 147 mg/dL — AB (ref 65–99)
Glucose-Capillary: 280 mg/dL — ABNORMAL HIGH (ref 65–99)
Glucose-Capillary: 169 mg/dL — ABNORMAL HIGH (ref 65–99)
Glucose-Capillary: 189 mg/dL — ABNORMAL HIGH (ref 65–99)

## 2016-02-29 MED ORDER — POTASSIUM CHLORIDE CRYS ER 20 MEQ PO TBCR
40.0000 meq | EXTENDED_RELEASE_TABLET | Freq: Once | ORAL | Status: AC
  Administered 2016-02-29: 40 meq via ORAL
  Filled 2016-02-29: qty 2

## 2016-02-29 MED ORDER — IPRATROPIUM-ALBUTEROL 0.5-2.5 (3) MG/3ML IN SOLN
3.0000 mL | Freq: Three times a day (TID) | RESPIRATORY_TRACT | Status: DC
  Administered 2016-02-29 – 2016-03-02 (×8): 3 mL via RESPIRATORY_TRACT
  Filled 2016-02-29 (×8): qty 3

## 2016-02-29 NOTE — Progress Notes (Signed)
Pt found on the floor on the side lying on his back. Pt stated he was going to the chair tray found lying in the bed. No apparent signs or symptoms of injury. No c/o pain. Able to stand with two person assist and place in the bed. VSS. MD and Wife called and made aware of the incidence. No new orders at this point. Bed alarm and fall precautions in place. Pt educated on fall precaution protocol.

## 2016-02-29 NOTE — Progress Notes (Signed)
Patient ID: [name] [name], male   DOB: May 12, 1926, 82 y.o.   MRN: [id number]    Subjective:  Lethargic   Good output   Objective:  Filed Vitals:   02/28/16 2221 02/28/16 2330 02/29/16 0727 02/29/16 0822  BP:    99/78  Pulse:    83  Temp:    98.7 F (37.1 C)  TempSrc:    Oral  Resp:    20  Height:      Weight:    207 lb 7.3 oz (94.1 kg)  SpO2: 89% 95% 93% 94%    Intake/Output from previous day:  Intake/Output Summary (Last 24 hours) at 02/29/16 1229 Last data filed at 02/29/16 [id number]  Gross per 24 hour  Intake    580 ml  Output    600 ml  Net    -20 ml    Physical Exam: Affect appropriate Elderly white male  HEENT: normal Neck supple with no adenopathy JVP normal no bruits no thyromegaly Lungs clear with no wheezing and good diaphragmatic motion Heart:  S1/S2 no murmur, no rub, gallop or click PMI normal Abdomen: benighn, BS positve, no tenderness, no AAA no bruit.  No HSM or HJR Distal pulses intact with no bruits Plus 2 bilateral  Edema with stasis on left  Neuro non-focal Skin warm and dry No muscular weakness   Lab Results: Basic Metabolic Panel:  Recent Labs  02/28/16 0420 02/29/16 0526  NA 135 139  K 3.0* 3.7  CL 94* 100*  CO2 29 30  GLUCOSE 167* 152*  BUN 25* 23*  CREATININE 1.83* 1.68*  CALCIUM 8.3* 8.7*   CBC:  Recent Labs  02/26/16 1359 02/26/16 2023 02/28/16 0420  WBC 14.7* 14.4* 10.5  NEUTROABS 11.8*  --   --   HGB 13.5 13.4 12.4*  HCT 39.6 39.8 37.8*  MCV 95.9 96.1 95.2  PLT 220 222 213   Cardiac Enzymes:  Recent Labs  02/26/16 1359 02/26/16 2151 02/27/16 0458  TROPONINI 1.04* 1.01* 1.13*    Imaging: Ct Chest Wo Contrast  02/27/2016  CLINICAL DATA:  Pneumonia. EXAM: CT CHEST WITHOUT CONTRAST TECHNIQUE: Multidetector CT imaging of the chest was performed following the standard protocol without IV contrast. COMPARISON:  Chest x-ray 02/27/2016, chest CT 06/08/2005 FINDINGS: Heart: There is calcifications the coronary  arteries. No pericardial effusion. Heart size is normal. Vascular structures: Tortuous great vessels. Thoracic aorta is not aneurysmal. Mediastinum/thyroid: The visualized portion of the thyroid gland has a normal appearance. Numerous small mediastinal and hilar lymph nodes are present. These do not reach criteria for pathologic enlargement. Lungs/Airways: There are multiple pulmonary opacities, more confluent at the left lung base. Opacities are discrete and somewhat rounded in appearance in the upper lobes bilaterally, making it difficult to exclude superimposed metastatic disease as well as infectious process. Upper abdomen: The gallbladder is present. Left renal cyst is present. There is colonic diverticulosis. Chest wall/osseous structures: Multiple vertebral hemangiomata. No acute osseous abnormality. IMPRESSION: 1. Multifocal pulmonary opacities, consistent with infectious process. Typical and atypical infections such as fungal infections and tuberculosis could have this appearance. 2. Because of the discrete, rounded appearance of numerous densities in the upper lobes, follow-up CT is recommended following clinical improvement in 3-6 months to exclude metastatic disease. 3. Small mediastinal and hilar lymph nodes may be reactive. 4. Coronary artery disease. 5. Left renal cyst. 6. Colonic diverticulosis. Electronically Signed   By: Nolon Nations M.D.   On: 02/27/2016 21:36    Cardiac Studies:  ECG:  afib normal ST/T waves    Telemetry: afib   02/29/2016   Echo: 01/31/16  EF 55-60% reviewed   Medications:   . aspirin EC  325 mg Oral Daily  . atorvastatin  20 mg Oral q1800  . ceFEPime (MAXIPIME) IV  1 g Intravenous Q24H  . donepezil  5 mg Oral Daily  . finasteride  5 mg Oral Daily  . furosemide  40 mg Intravenous Daily  . guaiFENesin  1,200 mg Oral BID  . heparin  5,000 Units Subcutaneous 3 times per day  . insulin aspart  0-9 Units Subcutaneous TID WC  . ipratropium-albuterol  3 mL  Nebulization TID  . metoprolol succinate  50 mg Oral QHS  . sodium chloride flush  3 mL Intravenous Q12H  . vancomycin  1,000 mg Intravenous Q24H       Assessment/Plan:  Elevated Troponin:  Post fall no chest pain or ECG changes.   He seems to be stable and does not need any further evaluation .    Afib:  No anticoagulation given age and falls.  On metoprolol for rate control Pneumonia:  Recurrent LLL infiltrate on vanc and maxipime  plan per primary service Edema:  Chronic venous insuf.  Cr elevating decrease lasix to 40 iv daily   Will sign off.  Call for questions    Paris Hohn, Wonda Cheng, MD  02/29/2016 12:33 PM    [place],  [place], [place]  [address] Pager [phone] Phone: [phone]; Fax: [phone]   [facility]  [address] [place], [place]  [address] [phone]   Fax [phone]

## 2016-02-29 NOTE — Progress Notes (Signed)
PROGRESS NOTE  [name] [name] [id number] DOB: 1926-01-07 DOA: 02/26/2016 PCP: Haywood Pao, MD  HPI/Recap of past 24 hours:  Mildly demented gentleman , sitting in chair, reported  less cough, denies chest pain ,  edema in legs has almost resolved , wife in room  Assessment/Plan: Principal Problem:   Pneumonia Active Problems:   HYPERTENSION, BENIGN   Atrial fibrillation with RVR ([place])   Dyspnea   Fall   Acute renal failure superimposed on stage 3 chronic kidney disease ([place])   Hyperglycemia   BPH (benign prostatic hypertrophy)   Diabetes mellitus without complication ([place])   Acute respiratory failure with hypoxia ([place])   HCAP (healthcare-associated pneumonia)   Elevated troponin   Diabetes mellitus type 2, noninsulin dependent ([place])  Acute on chronic respiratory failure with hypoxia due to Healthcare associated pneumonia and diastolic chf exacerbation (patient is on home o2 2liter  that was started from last hospitalization): He is being readmitted after being discharged in 02/07/16 For sepsis due to pneumonia. Chest x-ray shows left lower lobe infiltrate, consistent with pneumonia .Labs, including blood cultures are pending. Lactic acid normal at 1.23. He was placed on IV vancomycin and Maxipime. Tmax is 100.5. Osats Are 94% in 4l (was 88 in RA)  -negative flu prc, , negative Strep pneumo urine antigen, negative Legionella urine antigen, swallow eval with only mild aspiration risk , regular diet and thin liquid per speech. --Nebulizers every 6 hours, q 4 prn Symbicort BID - ct chest with multifocal opacities, continue vanc/zosyn/nebs, continue o2 supplement, taper abx, consider to change to oral doxycycline in 1-2 days if clinically improving, will need repeat chest imaging in 3-4 weeks.  Elevation of troponin in the setting of sepsis: denies chest pain, cardiology consulted, conservative management recommended.  Sepsis presented on admission with fever  100.5,Leukocytosis 14.7, pna and hypoxia,  Blood Cultures no growth so far, final result pending, ua no infection, sputum not collected - abx as above - wbc normalized on 3/10  Acute on Chronic diastolic heart failure in the setting of AFib, LLL PNA , peripheral edema,  Elevated Troponin 1.04, EKG with Afib without any new changes QTC 430. BNP 171.Lactate 1.23. Last 2-D echo 01/31/16 showed normal systolic function, with ejection fraction 55-60%, unable to assess diastolic dysfunction due to atrial fibrillation.  Daily weights, strict I/O Patient was not on diuretics at home, start iv lasix this admission, will need to discharge on lasix likely, Continue home blood pressure medications : toptol xl,  D/c norvasc due to significant lower extremity edema Cardiology consulted, input appreciated, lasix dose per cardiology  Chronic kidney disease stage III, baseline creatinine1.5 Today at 1.54 - Minimize nephrotoxins and renally dose medications  DMII in the setting of recent steroids currently with blood glucose at 186. A1C was 8.3 on 2/8, new diagnosis, diabetes education. SSI   Chronic afib; CHADSvasc scoreat least 5 ( age 48, htn79, dm30, chf59), per cardiology outpatient's notes, patient is not compliant with meds including xarelto, so he has been on asa 325 . Rate controlled on metoprolol.  Hypokalemia; replace k  Deconditioning/baseline dementia: likely will need SNF, social worker consulted.  Code Status: full  Family Communication: patient and wife  Disposition Plan: SNF when bed available.   Consultants:  cardiology  Procedures:  none  Antibiotics:  Vanc/cefepime from admission   Objective: BP 99/78 mmHg  Pulse 83  Temp(Src) 98.7 F (37.1 C) (Oral)  Resp 20  Ht 5\' 5"  (1.651 m)  Wt 94.1 kg (  207 lb 7.3 oz)  BMI 34.52 kg/m2  SpO2 94%  Intake/Output Summary (Last 24 hours) at 02/29/16 1033 Last data filed at 02/29/16 [id number]  Gross per 24 hour  Intake    580  ml  Output    600 ml  Net    -20 ml   Filed Weights   02/27/16 0030 02/28/16 0521 02/29/16 0822  Weight: 94.3 kg (207 lb 14.3 oz) 95.2 kg (209 lb 14.1 oz) 94.1 kg (207 lb 7.3 oz)    Exam:   General:  NAD  Cardiovascular: IRRR  Respiratory: crackles at basis  Abdomen: Soft/ND/NT, positive BS  Musculoskeletal: bilateral pitting Edema up to mid calf resolving, chronic venous stasis skin changes  Neuro: know it is 2017, but not able to states the months, likely baseline  Data Reviewed: Basic Metabolic Panel:  Recent Labs Lab 02/26/16 1359 02/26/16 2023 02/28/16 0420 02/29/16 0526  NA 134*  --  135 139  K 4.0  --  3.0* 3.7  CL 99*  --  94* 100*  CO2 24  --  29 30  GLUCOSE 186*  --  167* 152*  BUN 17  --  25* 23*  CREATININE 1.54* 1.66* 1.83* 1.68*  CALCIUM 8.9  --  8.3* 8.7*   Liver Function Tests:  Recent Labs Lab 02/27/16 1106  AST 22  ALT 29  ALKPHOS 68  BILITOT 0.6  PROT 6.7  ALBUMIN 2.5*   No results for input(s): LIPASE, AMYLASE in the last 168 hours. No results for input(s): AMMONIA in the last 168 hours. CBC:  Recent Labs Lab 02/26/16 1359 02/26/16 2023 02/28/16 0420  WBC 14.7* 14.4* 10.5  NEUTROABS 11.8*  --   --   HGB 13.5 13.4 12.4*  HCT 39.6 39.8 37.8*  MCV 95.9 96.1 95.2  PLT 220 222 213   Cardiac Enzymes:    Recent Labs Lab 02/26/16 1359 02/26/16 2151 02/27/16 0458  TROPONINI 1.04* 1.01* 1.13*   BNP (last 3 results)  Recent Labs  04/21/15 0524 02/26/16 1359  BNP 83.1 171.0*    ProBNP (last 3 results) No results for input(s): PROBNP in the last 8760 hours.  CBG:  Recent Labs Lab 02/28/16 0610 02/28/16 1153 02/28/16 1653 02/28/16 2142 02/29/16 0558  GLUCAP 148* 233* 185* 170* 147*    Recent Results (from the past 240 hour(s))  Culture, blood (routine x 2) Call MD if unable to obtain prior to antibiotics being given     Status: None (Preliminary result)   Collection Time: 02/26/16  8:23 PM  Result Value  Ref Range Status   Specimen Description BLOOD RIGHT ANTECUBITAL  Final   Special Requests BOTTLES DRAWN AEROBIC AND ANAEROBIC 5CC EACH  Final   Culture NO GROWTH 2 DAYS  Final   Report Status PENDING  Incomplete  Culture, blood (routine x 2) Call MD if unable to obtain prior to antibiotics being given     Status: None (Preliminary result)   Collection Time: 02/26/16  8:30 PM  Result Value Ref Range Status   Specimen Description BLOOD RIGHT WRIST  Final   Special Requests BOTTLES DRAWN AEROBIC AND ANAEROBIC 5CC BOTH  Final   Culture NO GROWTH 2 DAYS  Final   Report Status PENDING  Incomplete  MRSA PCR Screening     Status: None   Collection Time: 02/27/16  3:42 PM  Result Value Ref Range Status   MRSA by PCR NEGATIVE NEGATIVE Final    Comment:  The GeneXpert MRSA Assay (FDA approved for NASAL specimens only), is one component of a comprehensive MRSA colonization surveillance program. It is not intended to diagnose MRSA infection nor to guide or monitor treatment for MRSA infections.      Studies: No results found.  Scheduled Meds: . aspirin EC  325 mg Oral Daily  . atorvastatin  20 mg Oral q1800  . ceFEPime (MAXIPIME) IV  1 g Intravenous Q24H  . donepezil  5 mg Oral Daily  . finasteride  5 mg Oral Daily  . furosemide  40 mg Intravenous Daily  . guaiFENesin  1,200 mg Oral BID  . heparin  5,000 Units Subcutaneous 3 times per day  . insulin aspart  0-9 Units Subcutaneous TID WC  . ipratropium-albuterol  3 mL Nebulization TID  . metoprolol succinate  50 mg Oral QHS  . sodium chloride flush  3 mL Intravenous Q12H  . vancomycin  1,000 mg Intravenous Q24H    Continuous Infusions:    Time spent: 59mins  Candies Palm MD, PhD  [facility] Pager [phone]. If 7PM-7AM, please contact night-coverage at [email or web address], password [facility] 02/29/2016, 10:33 AM  LOS: 3 days

## 2016-03-01 DIAGNOSIS — W19XXXA Unspecified fall, initial encounter: Secondary | ICD-10-CM

## 2016-03-01 LAB — BASIC METABOLIC PANEL
Anion gap: 10 (ref 5–15)
BUN: 23 mg/dL — ABNORMAL HIGH (ref 6–20)
CALCIUM: 9 mg/dL (ref 8.9–10.3)
CO2: 30 mmol/L (ref 22–32)
CREATININE: 1.74 mg/dL — AB (ref 0.61–1.24)
Chloride: 98 mmol/L — ABNORMAL LOW (ref 101–111)
GFR calc non Af Amer: 33 mL/min — ABNORMAL LOW (ref >60)
GFR, EST AFRICAN AMERICAN: 39 mL/min — AB (ref >60)
Glucose, Bld: 153 mg/dL — ABNORMAL HIGH (ref 65–99)
Potassium: 4.2 mmol/L (ref 3.5–5.1)
Sodium: 138 mmol/L (ref 135–145)

## 2016-03-01 LAB — GLUCOSE, CAPILLARY
GLUCOSE-CAPILLARY: 207 mg/dL — AB (ref 65–99)
Glucose-Capillary: 141 mg/dL — ABNORMAL HIGH (ref 65–99)
Glucose-Capillary: 186 mg/dL — ABNORMAL HIGH (ref 65–99)
Glucose-Capillary: 140 mg/dL — ABNORMAL HIGH (ref 65–99)

## 2016-03-01 NOTE — Progress Notes (Signed)
Pharmacy Antibiotic Note  [name] [name] is a 82 y.o. male admitted on 02/26/2016 with pneumonia.    WBC 10.5, afebrile, SCr 1.74 (CKD stage III) eCrCl ~17mL/min  Plan: Continue Vancomycin 1000mg  IV Q24h Cefepime 1g IV Q24h  Per MD note, likely change to doxy today or tomorrow. Will obtain VT if plans change and extended vanc duration expected. F/U c/s, renal fxn, LOT, VT@ss    Height: 5\' 5"  (165.1 cm) Weight: 207 lb 7.3 oz (94.1 kg) (bedscale) IBW/kg (Calculated) : 61.5  Temp (24hrs), Avg:98.2 F (36.8 C), Min:97.9 F (36.6 C), Max:98.6 F (37 C)   Recent Labs Lab 02/26/16 1350 02/26/16 1359 02/26/16 2023 02/28/16 0420 02/29/16 0526 03/01/16 0427  WBC  --  14.7* 14.4* 10.5  --   --   CREATININE  --  1.54* 1.66* 1.83* 1.68* 1.74*  LATICACIDVEN 1.23  --   --   --   --   --     Estimated Creatinine Clearance: 30.9 mL/min (by C-G formula based on Cr of 1.74).    Allergies  Allergen Reactions  . Sulfa Antibiotics Swelling    hands    Antimicrobials this admission: 3/8 Vancomycin>> 3/8 cefepime>>  Thank you for allowing pharmacy to be a part of this patient's care.  Angela Burke, PharmD Pharmacy Resident Pager: [phone]  03/01/2016 8:56 AM

## 2016-03-01 NOTE — Progress Notes (Signed)
PROGRESS NOTE  [name] [name] [id number] DOB: 1926-10-08 DOA: 02/26/2016 PCP: Haywood Pao, MD  HPI/Recap of past 24 hours:  Had an fall yesterday evening, patient states " i do not want to talk about it, i  Got upset and i fell " per wife patient has been having falls at home as well.  Now  sitting in chair, reported  less cough, denies chest pain ,  edema in legs has almost resolved , report feeling much better, very pleasant, mild dementia at baseline  Assessment/Plan: Principal Problem:   Pneumonia Active Problems:   HYPERTENSION, BENIGN   Atrial fibrillation with RVR ([place])   Dyspnea   Fall   Acute renal failure superimposed on stage 3 chronic kidney disease (HCC)   Hyperglycemia   BPH (benign prostatic hypertrophy)   Diabetes mellitus without complication (HCC)   Acute respiratory failure with hypoxia (HCC)   HCAP (healthcare-associated pneumonia)   Elevated troponin   Diabetes mellitus type 2, noninsulin dependent ([place])  Acute on chronic respiratory failure with hypoxia due to Healthcare associated pneumonia and diastolic chf exacerbation (patient is on home o2 2liter  that was started from last hospitalization): He is being readmitted after being discharged in 02/07/16 For sepsis due to pneumonia. Chest x-ray shows left lower lobe infiltrate, consistent with pneumonia .Labs, including blood cultures are pending. Lactic acid normal at 1.23. He was placed on IV vancomycin and Maxipime. Tmax is 100.5. Osats Are 94% in 4l (was 88 in RA)  -negative flu prc, , negative Strep pneumo urine antigen, negative Legionella urine antigen, swallow eval with only mild aspiration risk , regular diet and thin liquid per speech. --Nebulizers every 6 hours, q 4 prn Symbicort BID - ct chest with multifocal opacities, continue vanc/zosyn/nebs, continue o2 supplement, taper abx, consider to change to oral doxycycline on Monday if clinically continue to improve, will need repeat chest  imaging in 3-4 weeks.  Elevation of troponin in the setting of sepsis: denies chest pain, cardiology consulted, conservative management recommended.  Sepsis presented on admission with fever 100.5,Leukocytosis 14.7, pna and hypoxia,  Blood Cultures no growth so far, final result pending, ua no infection, sputum not collected - abx as above - wbc normalized on 3/10  Acute on Chronic diastolic heart failure in the setting of AFib, LLL PNA , peripheral edema,  Elevated Troponin 1.04, EKG with Afib without any new changes QTC 430. BNP 171.Lactate 1.23. Last 2-D echo 01/31/16 showed normal systolic function, with ejection fraction 55-60%, unable to assess diastolic dysfunction due to atrial fibrillation.  Daily weights, strict I/O Patient was not on diuretics at home, start iv lasix this admission, will need to discharge on lasix likely, Continue home blood pressure medications : toptol xl,  D/c norvasc due to significant lower extremity edema Cardiology consulted, input appreciated, lasix dose per cardiology  Chronic kidney disease stage III, baseline creatinine1.5 Today at 1.54 - Minimize nephrotoxins and renally dose medications  DMII in the setting of recent steroids currently with blood glucose at 186. A1C was 8.3 on 2/8, new diagnosis, diabetes education. SSI   Chronic afib; CHADSvasc scoreat least 5 ( age 61, htn65, dm7, chf38), per cardiology outpatient's notes, patient is not compliant with meds including xarelto, so he has been on asa 325 . Rate controlled on metoprolol.  Hypokalemia; replace k  Deconditioning/baseline dementia: likely will need SNF, social worker consulted.  Code Status: full  Family Communication: patient and wife  Disposition Plan: SNF when bed available  Consultants:  cardiology  Procedures:  none  Antibiotics:  Vanc/cefepime from admission   Objective: BP 113/62 mmHg  Pulse 86  Temp(Src) 98.3 F (36.8 C) (Oral)  Resp 20  Ht 5\' 5"   (1.651 m)  Wt 94.1 kg (207 lb 7.3 oz)  BMI 34.52 kg/m2  SpO2 92%  Intake/Output Summary (Last 24 hours) at 03/01/16 1607 Last data filed at 03/01/16 1332  Gross per 24 hour  Intake    530 ml  Output   1175 ml  Net   -645 ml   Filed Weights   02/27/16 0030 02/28/16 0521 02/29/16 0822  Weight: 94.3 kg (207 lb 14.3 oz) 95.2 kg (209 lb 14.1 oz) 94.1 kg (207 lb 7.3 oz)    Exam:   General:  NAD  Cardiovascular: IRRR  Respiratory: crackles at left basis  Abdomen: Soft/ND/NT, positive BS  Musculoskeletal: bilateral pitting Edema up to mid calf resolving, chronic venous stasis skin changes  Neuro: know it is 2017, but not able to states the months, likely baseline  Data Reviewed: Basic Metabolic Panel:  Recent Labs Lab 02/26/16 1359 02/26/16 2023 02/28/16 0420 02/29/16 0526 03/01/16 0427  NA 134*  --  135 139 138  K 4.0  --  3.0* 3.7 4.2  CL 99*  --  94* 100* 98*  CO2 24  --  29 30 30   GLUCOSE 186*  --  167* 152* 153*  BUN 17  --  25* 23* 23*  CREATININE 1.54* 1.66* 1.83* 1.68* 1.74*  CALCIUM 8.9  --  8.3* 8.7* 9.0   Liver Function Tests:  Recent Labs Lab 02/27/16 1106  AST 22  ALT 29  ALKPHOS 68  BILITOT 0.6  PROT 6.7  ALBUMIN 2.5*   No results for input(s): LIPASE, AMYLASE in the last 168 hours. No results for input(s): AMMONIA in the last 168 hours. CBC:  Recent Labs Lab 02/26/16 1359 02/26/16 2023 02/28/16 0420  WBC 14.7* 14.4* 10.5  NEUTROABS 11.8*  --   --   HGB 13.5 13.4 12.4*  HCT 39.6 39.8 37.8*  MCV 95.9 96.1 95.2  PLT 220 222 213   Cardiac Enzymes:    Recent Labs Lab 02/26/16 1359 02/26/16 2151 02/27/16 0458  TROPONINI 1.04* 1.01* 1.13*   BNP (last 3 results)  Recent Labs  04/21/15 0524 02/26/16 1359  BNP 83.1 171.0*    ProBNP (last 3 results) No results for input(s): PROBNP in the last 8760 hours.  CBG:  Recent Labs Lab 02/29/16 1117 02/29/16 1622 02/29/16 2318 03/01/16 0612 03/01/16 1152  GLUCAP 280* 169*  189* 141* 207*    Recent Results (from the past 240 hour(s))  Culture, blood (routine x 2) Call MD if unable to obtain prior to antibiotics being given     Status: None (Preliminary result)   Collection Time: 02/26/16  8:23 PM  Result Value Ref Range Status   Specimen Description BLOOD RIGHT ANTECUBITAL  Final   Special Requests BOTTLES DRAWN AEROBIC AND ANAEROBIC 5CC EACH  Final   Culture NO GROWTH 4 DAYS  Final   Report Status PENDING  Incomplete  Culture, blood (routine x 2) Call MD if unable to obtain prior to antibiotics being given     Status: None (Preliminary result)   Collection Time: 02/26/16  8:30 PM  Result Value Ref Range Status   Specimen Description BLOOD RIGHT WRIST  Final   Special Requests BOTTLES DRAWN AEROBIC AND ANAEROBIC 5CC BOTH  Final   Culture NO GROWTH 4 DAYS  Final   Report Status PENDING  Incomplete  MRSA PCR Screening     Status: None   Collection Time: 02/27/16  3:42 PM  Result Value Ref Range Status   MRSA by PCR NEGATIVE NEGATIVE Final    Comment:        The GeneXpert MRSA Assay (FDA approved for NASAL specimens only), is one component of a comprehensive MRSA colonization surveillance program. It is not intended to diagnose MRSA infection nor to guide or monitor treatment for MRSA infections.      Studies: No results found.  Scheduled Meds: . aspirin EC  325 mg Oral Daily  . atorvastatin  20 mg Oral q1800  . ceFEPime (MAXIPIME) IV  1 g Intravenous Q24H  . donepezil  5 mg Oral Daily  . finasteride  5 mg Oral Daily  . furosemide  40 mg Intravenous Daily  . guaiFENesin  1,200 mg Oral BID  . heparin  5,000 Units Subcutaneous 3 times per day  . insulin aspart  0-9 Units Subcutaneous TID WC  . ipratropium-albuterol  3 mL Nebulization TID  . metoprolol succinate  50 mg Oral QHS  . sodium chloride flush  3 mL Intravenous Q12H  . vancomycin  1,000 mg Intravenous Q24H    Continuous Infusions:    Time spent: 15mins  Tyreke Kaeser MD,  PhD  [facility] Pager [phone]. If 7PM-7AM, please contact night-coverage at [email or web address], password [facility] 03/01/2016, 4:07 PM  LOS: 4 days

## 2016-03-02 DIAGNOSIS — R5381 Other malaise: Secondary | ICD-10-CM | POA: Diagnosis not present

## 2016-03-02 DIAGNOSIS — R7989 Other specified abnormal findings of blood chemistry: Secondary | ICD-10-CM | POA: Diagnosis not present

## 2016-03-02 DIAGNOSIS — I5032 Chronic diastolic (congestive) heart failure: Secondary | ICD-10-CM | POA: Diagnosis not present

## 2016-03-02 DIAGNOSIS — N3281 Overactive bladder: Secondary | ICD-10-CM | POA: Diagnosis not present

## 2016-03-02 DIAGNOSIS — E1122 Type 2 diabetes mellitus with diabetic chronic kidney disease: Secondary | ICD-10-CM | POA: Diagnosis not present

## 2016-03-02 DIAGNOSIS — D638 Anemia in other chronic diseases classified elsewhere: Secondary | ICD-10-CM | POA: Diagnosis not present

## 2016-03-02 DIAGNOSIS — N390 Urinary tract infection, site not specified: Secondary | ICD-10-CM | POA: Diagnosis not present

## 2016-03-02 DIAGNOSIS — E119 Type 2 diabetes mellitus without complications: Secondary | ICD-10-CM | POA: Diagnosis not present

## 2016-03-02 DIAGNOSIS — E785 Hyperlipidemia, unspecified: Secondary | ICD-10-CM | POA: Diagnosis not present

## 2016-03-02 DIAGNOSIS — R278 Other lack of coordination: Secondary | ICD-10-CM | POA: Diagnosis not present

## 2016-03-02 DIAGNOSIS — Z794 Long term (current) use of insulin: Secondary | ICD-10-CM | POA: Diagnosis not present

## 2016-03-02 DIAGNOSIS — J9611 Chronic respiratory failure with hypoxia: Secondary | ICD-10-CM | POA: Diagnosis not present

## 2016-03-02 DIAGNOSIS — J9601 Acute respiratory failure with hypoxia: Secondary | ICD-10-CM | POA: Diagnosis not present

## 2016-03-02 DIAGNOSIS — I10 Essential (primary) hypertension: Secondary | ICD-10-CM | POA: Diagnosis not present

## 2016-03-02 DIAGNOSIS — R269 Unspecified abnormalities of gait and mobility: Secondary | ICD-10-CM | POA: Diagnosis not present

## 2016-03-02 DIAGNOSIS — J189 Pneumonia, unspecified organism: Secondary | ICD-10-CM | POA: Diagnosis not present

## 2016-03-02 DIAGNOSIS — I5033 Acute on chronic diastolic (congestive) heart failure: Secondary | ICD-10-CM

## 2016-03-02 DIAGNOSIS — R2681 Unsteadiness on feet: Secondary | ICD-10-CM | POA: Diagnosis not present

## 2016-03-02 DIAGNOSIS — R0602 Shortness of breath: Secondary | ICD-10-CM | POA: Diagnosis not present

## 2016-03-02 DIAGNOSIS — I4891 Unspecified atrial fibrillation: Secondary | ICD-10-CM | POA: Diagnosis not present

## 2016-03-02 DIAGNOSIS — J90 Pleural effusion, not elsewhere classified: Secondary | ICD-10-CM | POA: Diagnosis not present

## 2016-03-02 DIAGNOSIS — N183 Chronic kidney disease, stage 3 (moderate): Secondary | ICD-10-CM | POA: Diagnosis not present

## 2016-03-02 DIAGNOSIS — N400 Enlarged prostate without lower urinary tract symptoms: Secondary | ICD-10-CM | POA: Diagnosis not present

## 2016-03-02 DIAGNOSIS — M6281 Muscle weakness (generalized): Secondary | ICD-10-CM | POA: Diagnosis not present

## 2016-03-02 DIAGNOSIS — F0390 Unspecified dementia without behavioral disturbance: Secondary | ICD-10-CM | POA: Diagnosis not present

## 2016-03-02 DIAGNOSIS — R4189 Other symptoms and signs involving cognitive functions and awareness: Secondary | ICD-10-CM | POA: Diagnosis not present

## 2016-03-02 DIAGNOSIS — J449 Chronic obstructive pulmonary disease, unspecified: Secondary | ICD-10-CM | POA: Diagnosis not present

## 2016-03-02 DIAGNOSIS — R41841 Cognitive communication deficit: Secondary | ICD-10-CM | POA: Diagnosis not present

## 2016-03-02 LAB — CULTURE, BLOOD (ROUTINE X 2)
CULTURE: NO GROWTH
Culture: NO GROWTH

## 2016-03-02 LAB — BASIC METABOLIC PANEL
ANION GAP: 10 (ref 5–15)
BUN: 26 mg/dL — ABNORMAL HIGH (ref 6–20)
BUN: 26 mg/dL — AB (ref 4–21)
CO2: 30 mmol/L (ref 22–32)
Calcium: 9 mg/dL (ref 8.9–10.3)
Chloride: 96 mmol/L — ABNORMAL LOW (ref 101–111)
Creatinine, Ser: 1.77 mg/dL — ABNORMAL HIGH (ref 0.61–1.24)
Creatinine: 1.8 mg/dL — AB (ref 0.6–1.3)
GFR calc Af Amer: 38 mL/min — ABNORMAL LOW (ref >60)
GFR calc non Af Amer: 33 mL/min — ABNORMAL LOW (ref >60)
GLUCOSE: 210 mg/dL — AB (ref 65–99)
Glucose: 210 mg/dL
POTASSIUM: 4.2 mmol/L (ref 3.5–5.1)
Sodium: 136 mmol/L (ref 135–145)
Sodium: 136 mmol/L — AB (ref 137–147)

## 2016-03-02 LAB — GLUCOSE, CAPILLARY
GLUCOSE-CAPILLARY: 217 mg/dL — AB (ref 65–99)
Glucose-Capillary: 223 mg/dL — ABNORMAL HIGH (ref 65–99)

## 2016-03-02 MED ORDER — DOXYCYCLINE HYCLATE 100 MG PO CAPS: 100.0000 mg | ORAL_CAPSULE | Freq: Two times a day (BID) | ORAL | Status: DC

## 2016-03-02 MED ORDER — FUROSEMIDE 40 MG PO TABS: 40.0000 mg | ORAL_TABLET | Freq: Every day | ORAL | Status: DC

## 2016-03-02 MED ORDER — FLORANEX PO PACK: 1.0000 g | PACK | Freq: Three times a day (TID) | ORAL | Status: DC

## 2016-03-02 MED ORDER — INSULIN GLARGINE 100 UNIT/ML ~~LOC~~ SOLN: 5.0000 [IU] | Freq: Every day | SUBCUTANEOUS | Status: DC

## 2016-03-02 MED ORDER — ATORVASTATIN CALCIUM 20 MG PO TABS: 20.0000 mg | ORAL_TABLET | Freq: Every day | ORAL | Status: DC

## 2016-03-02 MED ORDER — INSULIN ASPART 100 UNIT/ML ~~LOC~~ SOLN: 0.0000 [IU] | Freq: Three times a day (TID) | SUBCUTANEOUS | Status: DC

## 2016-03-02 NOTE — Care Management Important Message (Signed)
Important Message  Patient Details  Name: [name] [name] MRN: [id number] Date of Birth: February 16, 1926   Medicare Important Message Given:  Yes    Gwendolynn Merkey P [place]stone 03/02/2016, 12:15 PM

## 2016-03-02 NOTE — Progress Notes (Signed)
Physical Therapy Treatment Patient Details Name: [name] [name] MRN: [id number] DOB: [birth year] Today's Date: 03/02/2016    History of Present Illness [name] [name] is a 82 y.o. male with a Past Medical History ofHTN, HLD, Afib, OSA, DM who presents with acute respiratory failure likely secondary to H CAP    PT Comments    Noting improvements in activity tolerance with incr distance amb, and O2 stas remaining greater than or equal to 95%; still needing +2 assist for chair follow for safety; Overall progressing well; Anticipate continuing good progress at post-acute rehabilitation.   Follow Up Recommendations  SNF;Supervision/Assistance - 24 hour     Equipment Recommendations  None recommended by PT    Recommendations for Other Services       Precautions / Restrictions Precautions Precautions: Fall    Mobility  Bed Mobility Overal bed mobility: Needs Assistance Bed Mobility: Rolling;Sidelying to Sit Rolling: Min assist Sidelying to sit: Mod assist       General bed mobility comments: Cues for technqiue; mod assist to elevate trunk  Transfers Overall transfer level: Needs assistance Equipment used: Rolling walker (2 wheeled) Transfers: Sit to/from Stand Sit to Stand: Min assist         General transfer comment: Min assist for boost and balance. Posterior loss of balance. Requires cues for anterior weight shift and hand placement. Practiced x2. Fair descent into chair.  Ambulation/Gait Ambulation/Gait assistance: Min assist;Mod assist;+2 physical assistance Ambulation Distance (Feet): 20 Feet (x2) Assistive device: Rolling walker (2 wheeled) Gait Pattern/deviations: Step-through pattern;Decreased step length - right;Decreased step length - left;Trunk flexed     General Gait Details: Max VC for walker placement for proximity and to keep hands on RW. Easily distracted. Min assist for balance and RW control. A few standing rest breaks with cues to open chest and  extend trunk with deep inspiration; Pt fatigued rapidly, reaches for furniture and attempts to sit prematurely.  +2 follow with chair.   Stairs            Wheelchair Mobility    Modified Rankin (Stroke Patients Only)       Balance     Sitting balance-Leahy Scale: Fair       Standing balance-Leahy Scale: Poor                      Cognition Arousal/Alertness: Awake/alert Behavior During Therapy: Impulsive           Following Commands: Follows one step commands consistently Safety/Judgement: Decreased awareness of safety;Decreased awareness of deficits   Problem Solving: Difficulty sequencing;Requires verbal cues;Requires tactile cues      Exercises      General Comments General comments (skin integrity, edema, etc.): SpO2 95% on room air coughing frequently      Pertinent Vitals/Pain Pain Assessment: No/denies pain    Home Living                      Prior Function            PT Goals (current goals can now be found in the care plan section) Acute Rehab PT Goals Patient Stated Goal: get stronger PT Goal Formulation: Patient unable to participate in goal setting Time For Goal Achievement: 03/13/16 Potential to Achieve Goals: Fair Progress towards PT goals: Progressing toward goals    Frequency  Min 3X/week    PT Plan Current plan remains appropriate    Co-evaluation  End of Session           Time: 1115-1140 PT Time Calculation (min) (ACUTE ONLY): 25 min  Charges:  $Gait Training: 8-22 mins $Therapeutic Activity: 8-22 mins                    G Codes:      Quin Hoop 03/02/2016, 12:38 PM  Roney Marion, [place] Pager [phone] Office [phone]

## 2016-03-02 NOTE — Care Management Note (Signed)
Case Management Note  Patient Details  Name: [name] [name] MRN: [id number] Date of Birth: 01-09-1926  Subjective/Objective:      Pneumonia              Action/Plan: Discharge Planning:  CSW following for SNF. Scheduled dc to [facility]  Expected Discharge Date:  03/02/2016             Expected Discharge Plan:  Skilled Nursing Facility  In-House Referral:  Clinical Social Work  Discharge planning Services  CM Consult  Post Acute Care Choice:  NA Choice offered to:  NA  DME Arranged:  N/A DME Agency:  NA  HH Arranged:  NA HH Agency:  NA  Status of Service:  Completed, signed off  Medicare Important Message Given:  Yes Date Medicare IM Given:    Medicare IM give by:    Date Additional Medicare IM Given:    Additional Medicare Important Message give by:     If discussed at [place] of Stay Meetings, dates discussed:    Additional Comments:  Erenest Rasher, RN 03/02/2016, 1:35 PM

## 2016-03-02 NOTE — Progress Notes (Addendum)
Pt has orders to be discharged [facility]. Telemetry box removed. IV removed and site in good condition. Pt stable and waiting for transportation. Report called to [facility].   Maurene Capes RN

## 2016-03-02 NOTE — Clinical Social Work Note (Signed)
Clinical Social Work Assessment  Patient Details  Name: [name] [name] MRN: [id number] Date of Birth: [birth year]  Date of referral:  03/02/16               Reason for consult:  Facility Placement                Permission sought to share information with:  Case Manager, Family Supports Permission granted to share information::  Yes, Verbal Permission Granted  Name::     [name] [name]  Agency::     Relationship::  Spouse  Contact Information:  [phone]  Housing/Transportation Living arrangements for the past 2 months:  [place] of Information:  Patient, Spouse Patient Interpreter Needed:  None Criminal Activity/Legal Involvement Pertinent to Current Situation/Hospitalization:  No - Comment as needed Significant Relationships:  Spouse Lives with:  Spouse Do you feel safe going back to the place where you live?  Yes Need for family participation in patient care:  Yes (Comment)  Care giving concerns:  Wife reports she is supportive and agrees with PT recommendation for short term rehab.    Social Worker assessment / plan: CSW received consult by Therapist, sports. CSW went to speak with patient regarding the possible need for short term rehab. CSW introduced self and acknowledged the patient. Patient is alert and orientedx4. Patient was calm and cooperative with CSW assessment. Wife [name] [name] was at bedside. Patient provided CSW with permission to speak while wife is in room. CSW informed patient of PT recommendation for short term rehab. Patient and wife is agreeable to SNF placement. CSW provided patient with a list of SNF facilities in [facility], and was provided permission to fax clinical information out to the facilities. Patient and family's preferences: (1)[facility] and (2) [facility]. CSW to complete FL2 for MD signature. CSW to initiate SNF placement process.    Employment status:  Disabled (Comment on whether or not currently receiving  Disability) Insurance information:  Managed Medicare PT Recommendations:  [place] / Referral to community resources:  [place]  Patient/Family's Response to care:  Patient and wife is agreeable with disposition plan.   Patient/Family's Understanding of and Emotional Response to Diagnosis, Current Treatment, and Prognosis:  Patient and family were very cooperative with CSW assessment. Family is very involved in patient's care and is aware of current treatment and prognosis. Patient and family is agreeable to disposition plan. Patient is aware and understands that he may be discharged on today. Patient reports he is ready to go and very hopeful for his progress and return back home. Patient and wife are appreciative of CSW intervention.    Emotional Assessment Appearance:  Appears stated age Attitude/Demeanor/Rapport:   (Calm and Cooperative) Affect (typically observed):  Accepting, Appropriate, Calm Orientation:  Oriented to Self, Oriented to Place, Oriented to Situation Alcohol / Substance use:  Not Applicable Psych involvement (Current and /or in the community):  No (Comment)  Discharge Needs  Concerns to be addressed:  Discharge Planning Concerns Readmission within the last 30 days:  No Current discharge risk:  None Barriers to Discharge:  Barriers Resolved   Raymondo Band, LCSW 03/02/2016, 10:38 AM

## 2016-03-02 NOTE — Progress Notes (Signed)
Patient has accepted bed offer at [facility]. Facility has been informed of patient and family's selection. Per facility representative, patient can arrive to facility on today. CSW to inform patient and family of transfer time. Patient and family appreciative of CSW services.   Patient to be transported via [place] to [facility] D/C Summary to be sent via Hub system when available. No further needs were requested at this time. CSW to sign off.   Please re-consult if further CSW needs arise.   Lucius Conn, [employer] Worker [facility] Ph: [phone]

## 2016-03-02 NOTE — Clinical Social Work Placement (Signed)
   CLINICAL SOCIAL WORK PLACEMENT  NOTE  Date:  03/02/2016  Patient Details  Name: COLLAN FACCHINI MRN: [id number] Date of Birth: 10/12/1926  Clinical Social Work is seeking post-discharge placement for this patient at the [place] level of care (*CSW will initial, date and re-position this form in  chart as items are completed):  Yes   Patient/family provided with [place] Work Department's list of facilities offering this level of care within the geographic area requested by the patient (or if unable, by the patient's family).  Yes   Patient/family informed of their freedom to choose among providers that offer the needed level of care, that participate in Medicare, Medicaid or managed care program needed by the patient, have an available bed and are willing to accept the patient.      Patient/family informed of [facility]'s ownership interest in [facility] and [facility], as well as of the fact that they are under no obligation to receive care at these facilities.  PASRR submitted to EDS on       PASRR number received on       Existing PASRR number confirmed on 03/02/16     FL2 transmitted to all facilities in geographic area requested by pt/family on       FL2 transmitted to all facilities within larger geographic area on 03/02/16     Patient informed that his/her managed care company has contracts with or will negotiate with certain facilities, including the following:        Yes   Patient/family informed of bed offers received.  Patient [facility] bed at  [facility])     Physician recommends and patient chooses bed at      Patient to be transferred to  [facility]) on 03/02/16.  Patient to be transferred to facility by  [facility])     Patient family notified on 03/02/16 of transfer.  Name of family member notified:  Wife Sheral Apley and patient aware.     PHYSICIAN Please prepare priority discharge summary, including  medications     Additional Comment:    _______________________________________________ Raymondo Band, LCSW 03/02/2016, 11:41 AM

## 2016-03-02 NOTE — NC FL2 (Signed)
[place] LEVEL OF CARE SCREENING TOOL     IDENTIFICATION  Patient Name: [name] [name] Birthdate: 31-Mar-1926 Sex: male Admission Date (Current Location): 02/26/2016  [facility] and [place] Number:  Herbalist and Address:  [facility] [place], [place] [address] [place], [place] [address]      Provider Number: [id number]  Attending Physician Name and Address:  Florencia Reasons, MD  Relative Name and Phone Number:       Current Level of Care: Hospital Recommended Level of Care: [place] Prior Approval Number:    Date Approved/Denied: 01/31/16 PASRR Number: [id number]  Discharge Plan: SNF    Current Diagnoses: Patient Active Problem List   Diagnosis Date Noted  . Diabetes mellitus type 2, noninsulin dependent ([place])   . Pneumonia 02/26/2016  . HCAP (healthcare-associated pneumonia) 02/26/2016  . Elevated troponin 02/26/2016  . CAP (community acquired pneumonia)   . Diabetes mellitus without complication ([place]) [id number]  . Acute respiratory failure with hypoxia ([place]) 01/28/2016  . Elevated lactic acid level 01/28/2016  . Sepsis ([place]) 01/28/2016  . Acute renal failure superimposed on stage 3 chronic kidney disease ([place]) 04/21/2015  . Hyperglycemia 04/21/2015  . Bronchitis 04/21/2015  . Tachycardia 04/21/2015  . BPH (benign prostatic hypertrophy) 04/21/2015  . Dyspnea 12/28/2012  . Fall 12/28/2012  . OTHER DYSPNEA AND RESPIRATORY ABNORMALITIES 05/27/2010  . Hyperlipidemia 11/27/2008  . HYPERTENSION, BENIGN 11/27/2008  . Atrial fibrillation with RVR ([place]) 11/27/2008  . GERD 11/27/2008  . BPH/LUTS W/O OBSTRUCTION 11/27/2008  . ARTHRITIS 11/27/2008    Orientation RESPIRATION BLADDER Height & Weight     Self, Situation, Place  Normal Continent Weight: 198 lb (89.812 kg) Height:  5\' 5"  (165.1 cm)  BEHAVIORAL SYMPTOMS/MOOD NEUROLOGICAL BOWEL NUTRITION STATUS      Continent Diet  AMBULATORY STATUS COMMUNICATION OF NEEDS  Skin   Limited Assist Verbally Normal                       Personal Care Assistance Level of Assistance  Bathing, Feeding, Dressing Bathing Assistance: Limited assistance Feeding assistance: Independent Dressing Assistance: Limited assistance     Functional Limitations Info  Sight, Hearing, Speech Sight Info: Adequate Hearing Info: Adequate Speech Info: Adequate    SPECIAL CARE FACTORS FREQUENCY  PT (By licensed PT)                    Contractures      Additional Factors Info  Code Status, Allergies Code Status Info: FULL Allergies Info: Sulfa Antibiotics           Current Medications (03/02/2016):  This is the current hospital active medication list Current Facility-Administered Medications  Medication Dose Route Frequency Provider Last Rate Last Dose  . acetaminophen (TYLENOL) tablet 650 mg  650 mg Oral Q6H PRN Gardiner Barefoot, NP   650 mg at 03/01/16 2338  . aspirin EC tablet 325 mg  325 mg Oral Daily Rondel Jumbo, PA-C   325 mg at 03/02/16 [id number]  . atorvastatin (LIPITOR) tablet 20 mg  20 mg Oral q1800 Florencia Reasons, MD   20 mg at 03/01/16 1736  . benzonatate (TESSALON) capsule 200 mg  200 mg Oral TID PRN Gardiner Barefoot, NP   200 mg at 03/01/16 2338  . ceFEPIme (MAXIPIME) 1 g in dextrose 5 % 50 mL IVPB  1 g Intravenous Q24H Conrad Pepper Pike, RPH   1 g at 03/01/16 1735  .  donepezil (ARICEPT) tablet 5 mg  5 mg Oral Daily Rondel Jumbo, PA-C   5 mg at 03/02/16 [id number]  . finasteride (PROSCAR) tablet 5 mg  5 mg Oral Daily Rondel Jumbo, PA-C   5 mg at 03/02/16 0912  . furosemide (LASIX) injection 40 mg  40 mg Intravenous Daily Josue Hector, MD   40 mg at 03/01/16 0942  . guaiFENesin (MUCINEX) 12 hr tablet 1,200 mg  1,200 mg Oral BID Rondel Jumbo, PA-C   1,200 mg at 03/02/16 [id number]  . guaiFENesin-dextromethorphan (ROBITUSSIN DM) 100-10 MG/5ML syrup 5 mL  5 mL Oral Q4H PRN Gardiner Barefoot, NP   5 mL at 02/27/16 0254  . heparin injection 5,000 Units   5,000 Units Subcutaneous 3 times per day Rondel Jumbo, PA-C   5,000 Units at 03/02/16 [id number]  . insulin aspart (novoLOG) injection 0-9 Units  0-9 Units Subcutaneous TID WC Rondel Jumbo, PA-C   3 Units at 03/02/16 [id number]  . ipratropium-albuterol (DUONEB) 0.5-2.5 (3) MG/3ML nebulizer solution 3 mL  3 mL Nebulization TID Florencia Reasons, MD   3 mL at 03/02/16 [id number]  . metoprolol succinate (TOPROL-XL) 24 hr tablet 50 mg  50 mg Oral QHS Rondel Jumbo, PA-C   50 mg at 03/01/16 2337  . sodium chloride flush (NS) 0.9 % injection 3 mL  3 mL Intravenous Q12H Rondel Jumbo, PA-C   3 mL at 03/02/16 1000  . vancomycin (VANCOCIN) IVPB 1000 mg/200 mL premix  1,000 mg Intravenous Q24H Leodis Sias, RPH   1,000 mg at 03/01/16 1735     Discharge Medications: Please see discharge summary for a list of discharge medications.  Relevant Imaging Results:  Relevant Lab Results:   Additional Information SSN [id number]  Raymondo Band, LCSW

## 2016-03-02 NOTE — Discharge Summary (Signed)
Discharge Summary  LYDIA KRUMREY [id number] DOB: 21-May-1926  PCP: Haywood Pao, MD  Admit date: 02/26/2016 Discharge date: 03/02/2016  Time spent: >75mins  Recommendations for Outpatient Follow-up:  1. F/u with SNF MD, repeat cbc/bmp in one week. 2. F/u with cardiology Dr Burt Knack in one week for diastolic chf Repeat CXR in 3weeks to ensure resolution of lung infiltrate. Repeat ct chest in three months for discrete, rounded appearance of numerous densities in the upper lobes.  3. F/u [facility] in two weeks for multifocal pulmonary opacities 4. SNF to wean oxygen  Discharge Diagnoses:  Active Hospital Problems   Diagnosis Date Noted  . Pneumonia 02/26/2016  . Diabetes mellitus type 2, noninsulin dependent ([place])   . HCAP (healthcare-associated pneumonia) 02/26/2016  . Elevated troponin 02/26/2016  . Acute respiratory failure with hypoxia ([place]) 01/28/2016  . Diabetes mellitus without complication ([place]) [id number]  . Hyperglycemia 04/21/2015  . BPH (benign prostatic hypertrophy) 04/21/2015  . Acute renal failure superimposed on stage 3 chronic kidney disease ([place]) 04/21/2015  . Dyspnea 12/28/2012  . Fall 12/28/2012  . HYPERTENSION, BENIGN 11/27/2008  . Atrial fibrillation with RVR ([place]) 11/27/2008    Resolved Hospital Problems   Diagnosis Date Noted Date Resolved  No resolved problems to display.    Discharge Condition: stable  Diet recommendation: heart healthy/carb modified  Filed Weights   02/28/16 0521 02/29/16 0822 03/02/16 0456  Weight: 95.2 kg (209 lb 14.1 oz) 94.1 kg (207 lb 7.3 oz) 89.812 kg (198 lb)    History of present illness:  HJALMER DANOWSKI is a 82 y.o. male with a history of diastolic heart failure, stage IIIc KD, atrial fibrillation on Xarelto, not on CPAP, recently hospitalized in 1/16- 01/2016 for sepsis in the setting of presumed pneumonia, completing a course of Augmentin as outpatient, presenting to the ED with increasing dyspnea,  and generalized weakness. He was working with his PT on a couch, when he became weak and short of breath, sustaining a fall. He did not hit his head. He was noted to have this sats, with 88% on 2 L of oxygen, which was increased to 4 L by EMS and was transferred to the ER. Denies rhinorrhea but reports "wet" sputum". Denies any hemoptysis. He reported subjectiove fevers, chills. Denies any chest pain, chest wall pain or palpitations He does have increased lower extremity edema. He denies any abdominal pain. He denies any nausea or vomiting. No dizziness or vertigo. No confusion was reported. Of note, he did receive Prevnar and a yearly flu vaccine this year.  At the ED, BP 126/81 mmHg  Pulse 97  Temp(Src) 100.5 F (38.1 C) (Rectal)  Resp 20  SpO2 94% CBC shows leukocytosis with WBC 14.7 ( while on prednisone, improved since 01/2016 in the 17s) Chest x-ray shows left lower lobe infiltrate, consistent with pneumonia.Labs, including blood cultures are pending. He was placed on IV vancomycin and Maxipime CMET remarkable for normal K, creatinine 1.54, otherwise normal. Troponin 1.04, EKG with Afib without any new changes QTC 430. BNP 171.Lactate 1.23. Last 2-D echo showed normal systolic function, with ejection fraction 55-60%, unable to assess diastolic dysfunction due to atrial fibrillation. Cardiology consultation is pending.   Hospital Course:  Principal Problem:   Pneumonia Active Problems:   HYPERTENSION, BENIGN   Atrial fibrillation with RVR ([facility])   Dyspnea   Fall   Acute renal failure superimposed on stage 3 chronic kidney disease ([facility])   Hyperglycemia   BPH (benign prostatic hypertrophy)  Diabetes mellitus without complication ([place])   Acute respiratory failure with hypoxia ([facility])   HCAP (healthcare-associated pneumonia)   Elevated troponin   Diabetes mellitus type 2, noninsulin dependent ([place])  Acute on chronic respiratory failure with hypoxia due to Healthcare associated pneumonia  and diastolic chf exacerbation (patient is on home o2 2liter that was started from last hospitalization): He is being readmitted after being discharged in 02/07/16 For sepsis due to pneumonia. Chest x-ray shows left lower lobe infiltrate, consistent with pneumonia .Labs, including blood cultures are pending. Lactic acid normal at 1.23. He was placed on IV vancomycin and Maxipime. Tmax is 100.5. Osats Are 94% in 4l (was 88 in RA)  -negative flu prc, , negative Strep pneumo urine antigen, negative Legionella urine antigen, swallow eval with only mild aspiration risk , regular diet and thin liquid per speech. -ct chest with multifocal opacities, continue vanc/zosyn/nebs, continue o2 supplement,  -better, change to oral doxycycline,  need repeat chest imaging in 3-4 weeks and repeat Ct chest in three months, patient is to follow up with [facility] pulmonology in two weeks.   Sepsis presented on admission with fever 100.5,Leukocytosis 14.7, pna and hypoxia,  Blood Cultures no growth so far, final result pending, ua no infection, sputum not collected - abx as above - wbc normalized on 3/10  Acute on Chronic diastolic heart failure in the setting of AFib, LLL PNA , peripheral edema,  Elevated Troponin 1.04, EKG with Afib without any new changes QTC 430. BNP 171.Lactate 1.23. Last 2-D echo 01/31/16 showed normal systolic function, with ejection fraction 55-60%, unable to assess diastolic dysfunction due to atrial fibrillation.  Daily weights, strict I/O Patient was not on diuretics at home, start iv lasix this admission, will need to discharge on lasix likely, Continue home blood pressure medications : toptol xl,  D/c norvasc due to significant lower extremity edema Cardiology consulted, input appreciated, lasix dose per cardiology, patient is discharged on 40mg  po lasix qd, close follow up with cardiology for further titration of heart failure meds.  Elevation of troponin in the setting of  sepsis: denies chest pain, cardiology consulted, conservative management recommended.  Chronic afib; CHADSvasc scoreat least 5 ( age 46, htn25, dm24, chf12), per cardiology outpatient's notes, patient is not compliant with meds including xarelto, so he has been on asa 325 . Rate controlled on metoprolol.  Chronic kidney disease stage III, baseline creatinine1.5 Today at 1.54 - Minimize nephrotoxins and renally dose medications  DMII in the setting of recent steroids currently with blood glucose at 186. A1C was 8.3 on 2/8, new diagnosis, diabetes education. SSI  Started insulin, further dose adjustment per SNF MD.   Hypokalemia; replace k  Deconditioning/baseline dementia: discharge to SNF.  Code Status: full  Family Communication: patient and wife  Disposition Plan: SNF on 3/13   Consultants:  cardiology  Procedures:  none  Antibiotics:  Vanc/cefepime from admission    Discharge Exam: BP 94/73 mmHg  Pulse 98  Temp(Src) 98.3 F (36.8 C) (Oral)  Resp 20  Ht 5\' 5"  (1.651 m)  Wt 89.812 kg (198 lb)  BMI 32.95 kg/m2  SpO2 91%    General: NAD, very pleasant, mildly demented   Cardiovascular: IRRR  Respiratory: crackles at left basis has largely resolved, no wheezing, no rhonchi, improved air entry  Abdomen: Soft/ND/NT, positive BS  Musculoskeletal: bilateral pitting Edema up to mid calf resolving, chronic venous stasis skin changes  Neuro: know it is 2017, but not able to states the months, likely baseline  Discharge Instructions You were cared for by a hospitalist during your hospital stay. If you have any questions about your discharge medications or the care you received while you were in the hospital after you are discharged, you can call the unit and asked to speak with the hospitalist on call if the hospitalist that took care of you is not available. Once you are discharged, your primary care physician will handle any further medical issues. Please note  that NO REFILLS for any discharge medications will be authorized once you are discharged, as it is imperative that you return to your primary care physician (or establish a relationship with a primary care physician if you do not have one) for your aftercare needs so that they can reassess your need for medications and monitor your lab values.      Discharge Instructions    Diet - low sodium heart healthy    Complete by:  As directed      Increase activity slowly    Complete by:  As directed             Medication List    STOP taking these medications        amLODipine 5 MG tablet  Commonly known as:  NORVASC     amoxicillin-clavulanate 875-125 MG tablet  Commonly known as:  AUGMENTIN     predniSONE 10 MG tablet  Commonly known as:  DELTASONE      TAKE these medications        aspirin EC 325 MG tablet  Take 1 tablet (325 mg total) by mouth daily.     atorvastatin 20 MG tablet  Commonly known as:  LIPITOR  Take 1 tablet (20 mg total) by mouth daily at 6 PM.     donepezil 5 MG tablet  Commonly known as:  ARICEPT  Take 5 mg by mouth daily.     doxycycline 100 MG capsule  Commonly known as:  VIBRAMYCIN  Take 1 capsule (100 mg total) by mouth 2 (two) times daily.     finasteride 5 MG tablet  Commonly known as:  PROSCAR  Take 5 mg by mouth daily.     furosemide 40 MG tablet  Commonly known as:  LASIX  Take 1 tablet (40 mg total) by mouth daily.     guaiFENesin 600 MG 12 hr tablet  Commonly known as:  MUCINEX  Take 2 tablets (1,200 mg total) by mouth 2 (two) times daily.     insulin aspart 100 UNIT/ML injection  Commonly known as:  novoLOG  Inject 0-9 Units into the skin 3 (three) times daily with meals. Before each meal 3 times a day, 140-199 - 2 units, 200-250 - 4 units, 251-299 - 6 units,  300-349 - 8 units,  350 or above 10 units. Insulin PEN if approved, provide syringes and needles if needed.     insulin glargine 100 UNIT/ML injection  Commonly known as:   LANTUS  Inject 0.05 mLs (5 Units total) into the skin at bedtime.     lactobacillus Pack  Take 1 packet (1 g total) by mouth 3 (three) times daily with meals.     levalbuterol 0.63 MG/3ML nebulizer solution  Commonly known as:  XOPENEX  Take 3 mLs (0.63 mg total) by nebulization every 6 (six) hours as needed for wheezing or shortness of breath.     metoprolol succinate 50 MG 24 hr tablet  Commonly known as:  TOPROL-XL  Take 1 tablet (50 mg total)  by mouth at bedtime.     MYRBETRIQ 50 MG Tb24 tablet  Generic drug:  mirabegron ER  Take 50 mg by mouth daily.       Allergies  Allergen Reactions  . Sulfa Antibiotics Swelling    hands   Follow-up Information    Follow up with Sherren Mocha, MD In 1 week.   Specialty:  Cardiology   Why:  heart failure, repeat bmp at follow up, continue to adjust diuretic dose   Contact information:   [address] [place] [address] [phone]       Follow up with [facility] In 2 weeks.   Specialty:  Pulmonology   Why:  multifocal pulmonary opacities    Contact information:   [place] [phone]       The results of significant diagnostics from this hospitalization (including imaging, microbiology, ancillary and laboratory) are listed below for reference.    Significant Diagnostic Studies: Dg Chest 2 View  02/27/2016  CLINICAL DATA:  Shortness of Breath EXAM: CHEST  2 VIEW COMPARISON:  02/26/2016 FINDINGS: Cardiac shadow is stable. The lungs are well aerated bilaterally. Mild left basilar infiltrate is again identified. The overall inspiratory effort is poor with crowding of the vascular markings. Aortic calcifications are again seen. No bony abnormality is noted. IMPRESSION: Stable left lower lobe infiltrate. Electronically Signed   By: Inez Catalina M.D.   On: 02/27/2016 08:20   Dg Chest 2 View  02/26/2016  CLINICAL DATA:  Shortness of breath and cough EXAM:  CHEST  2 VIEW COMPARISON:  01/31/2016 FINDINGS: Cardiac shadow within normal limits. The lungs are well aerated bilaterally. Left lower lobe infiltrate are noted. No acute bony abnormality is seen. IMPRESSION: Left lower lobe infiltrate. Electronically Signed   By: Inez Catalina M.D.   On: 02/26/2016 14:38   Ct Chest Wo Contrast  02/27/2016  CLINICAL DATA:  Pneumonia. EXAM: CT CHEST WITHOUT CONTRAST TECHNIQUE: Multidetector CT imaging of the chest was performed following the standard protocol without IV contrast. COMPARISON:  Chest x-ray 02/27/2016, chest CT 06/08/2005 FINDINGS: Heart: There is calcifications the coronary arteries. No pericardial effusion. Heart size is normal. Vascular structures: Tortuous great vessels. Thoracic aorta is not aneurysmal. Mediastinum/thyroid: The visualized portion of the thyroid gland has a normal appearance. Numerous small mediastinal and hilar lymph nodes are present. These do not reach criteria for pathologic enlargement. Lungs/Airways: There are multiple pulmonary opacities, more confluent at the left lung base. Opacities are discrete and somewhat rounded in appearance in the upper lobes bilaterally, making it difficult to exclude superimposed metastatic disease as well as infectious process. Upper abdomen: The gallbladder is present. Left renal cyst is present. There is colonic diverticulosis. Chest wall/osseous structures: Multiple vertebral hemangiomata. No acute osseous abnormality. IMPRESSION: 1. Multifocal pulmonary opacities, consistent with infectious process. Typical and atypical infections such as fungal infections and tuberculosis could have this appearance. 2. Because of the discrete, rounded appearance of numerous densities in the upper lobes, follow-up CT is recommended following clinical improvement in 3-6 months to exclude metastatic disease. 3. Small mediastinal and hilar lymph nodes may be reactive. 4. Coronary artery disease. 5. Left renal cyst. 6. Colonic  diverticulosis. Electronically Signed   By: Nolon Nations M.D.   On: 02/27/2016 21:36    Microbiology: Recent Results (from the past 240 hour(s))  Culture, blood (routine x 2) Call MD if unable to obtain prior to antibiotics being given  Status: None   Collection Time: 02/26/16  8:23 PM  Result Value Ref Range Status   Specimen Description BLOOD RIGHT ANTECUBITAL  Final   Special Requests BOTTLES DRAWN AEROBIC AND ANAEROBIC 5CC EACH  Final   Culture NO GROWTH 5 DAYS  Final   Report Status 03/02/2016 FINAL  Final  Culture, blood (routine x 2) Call MD if unable to obtain prior to antibiotics being given     Status: None   Collection Time: 02/26/16  8:30 PM  Result Value Ref Range Status   Specimen Description BLOOD RIGHT WRIST  Final   Special Requests BOTTLES DRAWN AEROBIC AND ANAEROBIC 5CC BOTH  Final   Culture NO GROWTH 5 DAYS  Final   Report Status 03/02/2016 FINAL  Final  MRSA PCR Screening     Status: None   Collection Time: 02/27/16  3:42 PM  Result Value Ref Range Status   MRSA by PCR NEGATIVE NEGATIVE Final    Comment:        The GeneXpert MRSA Assay (FDA approved for NASAL specimens only), is one component of a comprehensive MRSA colonization surveillance program. It is not intended to diagnose MRSA infection nor to guide or monitor treatment for MRSA infections.      Labs: Basic Metabolic Panel:  Recent Labs Lab 02/26/16 1359 02/26/16 2023 02/28/16 0420 02/29/16 0526 03/01/16 0427 03/02/16 0405  NA 134*  --  135 139 138 136  K 4.0  --  3.0* 3.7 4.2 4.2  CL 99*  --  94* 100* 98* 96*  CO2 24  --  29 30 30 30   GLUCOSE 186*  --  167* 152* 153* 210*  BUN 17  --  25* 23* 23* 26*  CREATININE 1.54* 1.66* 1.83* 1.68* 1.74* 1.77*  CALCIUM 8.9  --  8.3* 8.7* 9.0 9.0   Liver Function Tests:  Recent Labs Lab 02/27/16 1106  AST 22  ALT 29  ALKPHOS 68  BILITOT 0.6  PROT 6.7  ALBUMIN 2.5*   No results for input(s): LIPASE, AMYLASE in the last 168  hours. No results for input(s): AMMONIA in the last 168 hours. CBC:  Recent Labs Lab 02/26/16 1359 02/26/16 2023 02/28/16 0420  WBC 14.7* 14.4* 10.5  NEUTROABS 11.8*  --   --   HGB 13.5 13.4 12.4*  HCT 39.6 39.8 37.8*  MCV 95.9 96.1 95.2  PLT 220 222 213   Cardiac Enzymes:  Recent Labs Lab 02/26/16 1359 02/26/16 2151 02/27/16 0458  TROPONINI 1.04* 1.01* 1.13*   BNP: BNP (last 3 results)  Recent Labs  04/21/15 0524 02/26/16 1359  BNP 83.1 171.0*    ProBNP (last 3 results) No results for input(s): PROBNP in the last 8760 hours.  CBG:  Recent Labs Lab 03/01/16 1152 03/01/16 1645 03/01/16 2127 03/02/16 0638 03/02/16 1115  GLUCAP 207* 186* 140* 223* 217*       Signed:  Pamlea Finder MD, PhD  [facility] 03/02/2016, 1:33 PM

## 2016-03-03 ENCOUNTER — Encounter: Payer: Self-pay | Admitting: Internal Medicine

## 2016-03-03 ENCOUNTER — Non-Acute Institutional Stay (SKILLED_NURSING_FACILITY): Payer: Medicare Other | Admitting: Internal Medicine

## 2016-03-03 DIAGNOSIS — N183 Chronic kidney disease, stage 3 unspecified: Secondary | ICD-10-CM

## 2016-03-03 DIAGNOSIS — J189 Pneumonia, unspecified organism: Secondary | ICD-10-CM | POA: Diagnosis not present

## 2016-03-03 DIAGNOSIS — I10 Essential (primary) hypertension: Secondary | ICD-10-CM | POA: Diagnosis not present

## 2016-03-03 DIAGNOSIS — N3281 Overactive bladder: Secondary | ICD-10-CM | POA: Diagnosis not present

## 2016-03-03 DIAGNOSIS — J9601 Acute respiratory failure with hypoxia: Secondary | ICD-10-CM

## 2016-03-03 DIAGNOSIS — E1122 Type 2 diabetes mellitus with diabetic chronic kidney disease: Secondary | ICD-10-CM

## 2016-03-03 DIAGNOSIS — D638 Anemia in other chronic diseases classified elsewhere: Secondary | ICD-10-CM

## 2016-03-03 DIAGNOSIS — R4189 Other symptoms and signs involving cognitive functions and awareness: Secondary | ICD-10-CM | POA: Diagnosis not present

## 2016-03-03 DIAGNOSIS — R5381 Other malaise: Secondary | ICD-10-CM

## 2016-03-03 DIAGNOSIS — I4891 Unspecified atrial fibrillation: Secondary | ICD-10-CM

## 2016-03-03 DIAGNOSIS — I5032 Chronic diastolic (congestive) heart failure: Secondary | ICD-10-CM

## 2016-03-03 DIAGNOSIS — Z794 Long term (current) use of insulin: Secondary | ICD-10-CM

## 2016-03-03 NOTE — Progress Notes (Signed)
LOCATION: [place]  PCP: Haywood Pao, MD   Code Status: Full Code  Goals of care: Advanced Directive information Advanced Directives 02/27/2016  Does patient have an advance directive? Yes  Type of Paramedic of [place];Living will  Does patient want to make changes to advanced directive? No - Patient declined  Copy of advanced directive(s) in chart? No - copy requested  Would patient like information on creating an advanced directive? -     Extended Emergency Contact Information Primary Emergency Contact: [facility] Address: [address]          [place], [place] [address] [place] Phone: [phone] Mobile Phone: [phone] Relation: Spouse Secondary Emergency Contact: [place] Mobile Phone: [phone] Relation: Son   Allergies  Allergen Reactions  . Sulfa Antibiotics Swelling    hands    Chief Complaint  Patient presents with  . New Admit To SNF    New Admission     HPI:  Patient is a 82 y.o. male seen today for short term rehabilitation post hospital admission from 02/26/16-03/02/16 with acute respiratory failure with sepsis from pneumonia and chf exacerbation. He received antibiotics, lasix and o2. He had elevated troponin and cardiology was consulted. It was thought to be from demand ischemia in setting of sepsis. He has PMH of afib, ckd stage 3, DM type 2, HTN among others. He is seen in his room today. His wife is present at bedside.   Review of Systems:  Constitutional: Negative for fever, chills, malaise and diaphoresis. Energy level slowly coming back.  HENT: Negative for headache, congestion, nasal discharge, hearing loss, sore throat, difficulty swallowing. Eyes: Negative for blurred vision, double vision and discharge.  Respiratory: Negative for shortness of breath and wheezing. Positive for cough with white phlegm. On o2 and would like to come off it if  possible.   Cardiovascular: Negative for chest pain, palpitations, leg swelling.  Gastrointestinal: Negative for heartburn, nausea, vomiting, abdominal pain, loss of appetite, melena, diarrhea and constipation. Last bowel movement was yesterday.  Genitourinary: Negative for dysuria and flank pain.  Musculoskeletal: Negative for back pain, fall in the facility.  Neurological: Negative for dizziness. Psychiatric/Behavioral: Negative for depression    Past Medical History  Diagnosis Date  . Hypertension   . Hyperlipidemia   . Chronic atrial fibrillation ([place])   . Urinary retention foley last changed 3 weeks ago  . Arthritis   . Sleep apnea     no cpap used, sleep study was several yrs ago  . Diabetes mellitus without complication [facility])    Past Surgical History  Procedure Laterality Date  . Back surgery  yrs ago    lower back surgery  . Tonsillectomy  as child  . Prostate surgery  yrs ago, no cancer  . Transurethral resection of prostate  02/22/2012    Procedure: TRANSURETHRAL RESECTION OF THE PROSTATE WITH GYRUS INSTRUMENTS;  Surgeon: Molli Hazard, MD;  Location: [facility];  Service: Urology;  Laterality: N/A;       Social History:   reports that he has quit smoking. His smoking use included Cigarettes. He has a 25 pack-year smoking history. He has never used smokeless tobacco. He reports that he drinks alcohol. He reports that he does not use illicit drugs.  Family History  Problem Relation Age of Onset  . Heart attack Father     Medications:   Medication List       This list  is accurate as of: 03/03/16  9:20 AM.  Always use your most recent med list.               aspirin EC 325 MG tablet  Take 1 tablet (325 mg total) by mouth daily.     atorvastatin 20 MG tablet  Commonly known as:  LIPITOR  Take 1 tablet (20 mg total) by mouth daily at 6 PM.     donepezil 5 MG tablet  Commonly known as:  ARICEPT  Take 5 mg by mouth daily.     doxycycline 100 MG capsule   Commonly known as:  VIBRAMYCIN  Take 1 capsule (100 mg total) by mouth 2 (two) times daily.     finasteride 5 MG tablet  Commonly known as:  PROSCAR  Take 5 mg by mouth daily.     furosemide 40 MG tablet  Commonly known as:  LASIX  Take 1 tablet (40 mg total) by mouth daily.     guaiFENesin 600 MG 12 hr tablet  Commonly known as:  MUCINEX  Take 1,200 mg by mouth every 12 (twelve) hours.     insulin glargine 100 UNIT/ML injection  Commonly known as:  LANTUS  Inject 0.05 mLs (5 Units total) into the skin at bedtime.     lactobacillus Pack  Take 1 packet (1 g total) by mouth 3 (three) times daily with meals.     levalbuterol 0.63 MG/3ML nebulizer solution  Commonly known as:  XOPENEX  Take 0.63 mg by nebulization every 6 (six) hours as needed for wheezing or shortness of breath.     metoprolol 50 MG tablet  Commonly known as:  LOPRESSOR  Take 50 mg by mouth at bedtime.     MYRBETRIQ 50 MG Tb24 tablet  Generic drug:  mirabegron ER  Take 50 mg by mouth daily.     NOVOLOG FLEXPEN 100 UNIT/ML FlexPen  Generic drug:  insulin aspart  Inject 10 Units into the skin 3 (three) times daily with meals. 0-59= hypoglycemia protocol, 60-149= 0 units, 150-250= 5 units, 251-300= 8 units, 301-350= 10 units, >350 Call MD/NP        Immunizations: Immunization History  Administered Date(s) Administered  . PPD Test 03/02/2016     Physical Exam: Filed Vitals:   03/03/16 0845  BP: 105/84  Pulse: 77  Temp: 97.9 F (36.6 C)  TempSrc: Oral  Resp: 20  Height: 5\' 5"  (1.651 m)  Weight: 198 lb (89.812 kg)  SpO2: 93%   Body mass index is 32.95 kg/(m^2).  General- elderly obese male, in no acute distress Head- normocephalic, atraumatic Nose- nno maxillary or frontal sinus tenderness, no nasal discharge Throat- moist mucus membrane, poor dentition Eyes- PERRLA, EOMI, no pallor, no icterus, no discharge, normal conjunctiva, normal sclera Neck- no cervical lymphadenopathy, no  JVD Cardiovascular- normal s1,s2, no murmur, ankle and trace leg edema + Respiratory- bilateral poor air entry with rhonchi, no crackles, no use of accessory muscles Abdomen- bowel sounds present, soft, non tender Musculoskeletal- able to move all 4 extremities, generalized weakness present Neurological- alert and oriented to person, place and time Skin- warm and dry Psychiatry- normal mood and affect    Labs reviewed: Basic Metabolic Panel:  Recent Labs  02/29/16 0526 03/01/16 0427 03/02/16 03/02/16 0405  NA 139 138 136* 136  K 3.7 4.2  --  4.2  CL 100* 98*  --  96*  CO2 30 30  --  30  GLUCOSE 152* 153*  --  210*  BUN 23* 23* 26* 26*  CREATININE 1.68* 1.74* 1.8* 1.77*  CALCIUM 8.7* 9.0  --  9.0   Liver Function Tests:  Recent Labs  01/28/16 1758 02/27/16 1106  AST 30 22  ALT 26 29  ALKPHOS 75 68  BILITOT 0.9 0.6  PROT 8.0 6.7  ALBUMIN 4.0 2.5*   No results for input(s): LIPASE, AMYLASE in the last 8760 hours. No results for input(s): AMMONIA in the last 8760 hours. CBC:  Recent Labs  04/21/15 0424  01/28/16 1758  02/26/16 1359 02/26/16 2023 02/28/16 02/28/16 0420  WBC 11.7*  < > 15.8*  < > 14.7* 14.4* 10.5 10.5  NEUTROABS 9.2*  --  13.7*  --  11.8*  --   --   --   HGB 14.7  < > 14.3  < > 13.5 13.4  --  12.4*  HCT 42.2  < > 42.9  < > 39.6 39.8  --  37.8*  MCV 94.2  < > 96.4  < > 95.9 96.1  --  95.2  PLT 180  < > 163  < > 220 222  --  213  < > = values in this interval not displayed. Cardiac Enzymes:  Recent Labs  02/26/16 1359 02/26/16 2151 02/27/16 0458  TROPONINI 1.04* 1.01* 1.13*   BNP: Invalid input(s): POCBNP CBG:  Recent Labs  03/01/16 2127 03/02/16 0638 03/02/16 1115  GLUCAP 140* 223* 217*    Radiological Exams: Dg Chest 2 View  02/27/2016  CLINICAL DATA:  Shortness of Breath EXAM: CHEST  2 VIEW COMPARISON:  02/26/2016 FINDINGS: Cardiac shadow is stable. The lungs are well aerated bilaterally. Mild left basilar infiltrate is  again identified. The overall inspiratory effort is poor with crowding of the vascular markings. Aortic calcifications are again seen. No bony abnormality is noted. IMPRESSION: Stable left lower lobe infiltrate. Electronically Signed   By: Inez Catalina M.D.   On: 02/27/2016 08:20   Dg Chest 2 View  02/26/2016  CLINICAL DATA:  Shortness of breath and cough EXAM: CHEST  2 VIEW COMPARISON:  01/31/2016 FINDINGS: Cardiac shadow within normal limits. The lungs are well aerated bilaterally. Left lower lobe infiltrate are noted. No acute bony abnormality is seen. IMPRESSION: Left lower lobe infiltrate. Electronically Signed   By: Inez Catalina M.D.   On: 02/26/2016 14:38   Ct Chest Wo Contrast  02/27/2016  CLINICAL DATA:  Pneumonia. EXAM: CT CHEST WITHOUT CONTRAST TECHNIQUE: Multidetector CT imaging of the chest was performed following the standard protocol without IV contrast. COMPARISON:  Chest x-ray 02/27/2016, chest CT 06/08/2005 FINDINGS: Heart: There is calcifications the coronary arteries. No pericardial effusion. Heart size is normal. Vascular structures: Tortuous great vessels. Thoracic aorta is not aneurysmal. Mediastinum/thyroid: The visualized portion of the thyroid gland has a normal appearance. Numerous small mediastinal and hilar lymph nodes are present. These do not reach criteria for pathologic enlargement. Lungs/Airways: There are multiple pulmonary opacities, more confluent at the left lung base. Opacities are discrete and somewhat rounded in appearance in the upper lobes bilaterally, making it difficult to exclude superimposed metastatic disease as well as infectious process. Upper abdomen: The gallbladder is present. Left renal cyst is present. There is colonic diverticulosis. Chest wall/osseous structures: Multiple vertebral hemangiomata. No acute osseous abnormality. IMPRESSION: 1. Multifocal pulmonary opacities, consistent with infectious process. Typical and atypical infections such as fungal  infections and tuberculosis could have this appearance. 2. Because of the discrete, rounded appearance of numerous densities in the upper lobes, follow-up CT is  recommended following clinical improvement in 3-6 months to exclude metastatic disease. 3. Small mediastinal and hilar lymph nodes may be reactive. 4. Coronary artery disease. 5. Left renal cyst. 6. Colonic diverticulosis. Electronically Signed   By: Nolon Nations M.D.   On: 02/27/2016 21:36    Assessment/Plan  Physical deconditioning Will have him work with physical therapy and occupational therapy team to help with gait training and muscle strengthening exercises.fall precautions. Skin care. Encourage to be out of bed.   Acute respiratory failure With pneumonia and chf exacerbation. Continue and complete doxycycline on 03/06/16. Aspiration precautions. Wean off o2 as tolerated to room air. Continue levalbuterol  HCAP Continue and complete his antibiotic course. Provide incentive spirometer and encourage to use for pulmonary toileting. Continue levalbuterol. Continue mucinex dm 1200 mg bid in place of mucinex  CHF Monitor daily weight for now. Continue lasix 40 mg daily and metoprolol 50 mg daily. Monitor bmp  Anemia of chronic disease Monitor cbc  ckd stage 3 Monitor bmp  DM Lab Results  Component Value Date   HGBA1C 8.6* 02/26/2016   Monitor cbg. Continue lantus 5 u daily with SSI.    Afib Rate controlled. Continue lopressor 50 mg daily with aspirin EC 325 mg daily  HTN Stable, continue metoprolol and lasix and monitor bp daily x 1 week and then per facility protocol  Cognitive impairment Continue donepezil 5 mg daily and monitor, provide assistance with ADLs.   OAB Stable, continue myrbetriq for now  HLD Continue lipitor   Goals of care: short term rehabilitation   Labs/tests ordered: cbc, bmp 03/09/16  Family/ staff Communication: reviewed care plan with patient, his wife and nursing  supervisor    Blanchie Serve, MD Internal Medicine [place], [place] [address] Cell Phone (Monday-Friday 8 am - 5 pm): [phone] On Call: [phone] and follow prompts after 5 pm and on weekends Office Phone: [phone] Office Fax: [phone]

## 2016-03-09 DIAGNOSIS — I10 Essential (primary) hypertension: Secondary | ICD-10-CM | POA: Diagnosis not present

## 2016-03-10 ENCOUNTER — Encounter: Payer: Self-pay | Admitting: Physician Assistant

## 2016-03-10 ENCOUNTER — Ambulatory Visit (INDEPENDENT_AMBULATORY_CARE_PROVIDER_SITE_OTHER): Payer: Medicare Other | Admitting: Physician Assistant

## 2016-03-10 ENCOUNTER — Other Ambulatory Visit: Payer: Self-pay | Admitting: Physician Assistant

## 2016-03-10 VITALS — BP 118/68 | HR 84 | Ht 65.0 in | Wt 205.0 lb

## 2016-03-10 DIAGNOSIS — N183 Chronic kidney disease, stage 3 (moderate): Secondary | ICD-10-CM

## 2016-03-10 DIAGNOSIS — J189 Pneumonia, unspecified organism: Secondary | ICD-10-CM

## 2016-03-10 DIAGNOSIS — I5032 Chronic diastolic (congestive) heart failure: Secondary | ICD-10-CM | POA: Diagnosis not present

## 2016-03-10 LAB — BASIC METABOLIC PANEL
BUN: 29 mg/dL — ABNORMAL HIGH (ref 7–25)
CO2: 26 mmol/L (ref 20–31)
Calcium: 9 mg/dL (ref 8.6–10.3)
Chloride: 99 mmol/L (ref 98–110)
Creat: 1.91 mg/dL — ABNORMAL HIGH (ref 0.70–1.11)
GLUCOSE: 155 mg/dL — AB (ref 65–99)
POTASSIUM: 3.6 mmol/L (ref 3.5–5.3)
SODIUM: 138 mmol/L (ref 135–146)

## 2016-03-10 NOTE — Patient Instructions (Addendum)
Medication Instructions:  Your physician recommends that you continue on your current medications as directed. Please refer to the Current Medication list given to you today.   Labwork: BMET  TODAY   Testing/Procedures: NONE  Follow-Up:  Your physician recommends that you schedule a follow-up appointment in: 3 -4 MONTHS  WITH  DR Burt Knack  Any Other Special Instructions Will Be Listed Below (If Applicable).     If you need a refill on your cardiac medications before your next appointment, please call your pharmacy.

## 2016-03-10 NOTE — Progress Notes (Signed)
Cardiology Office Note   Date:  03/10/2016   ID:  [name] [name], DOB 21-Nov-1926, MRN [id number]  PCP:  Haywood Pao, MD  Cardiologist:  Dr. Burt Knack  No chief complaint on file.     History of Present Illness: [name] [name] is a 82 y.o. male who presents for follow-up. He has a history of permanent atrial fibrillation, hypertension, obesity, HLD, DM, stage III CKD and diastolic heart failure. He has chronic dyspnea and gait instability with a urinary incontinence since. He has been noncompliant with medical therapy over the years. He was last seen in October 2016, was noted he has been only intermittently compliant with Xarelto, given the risk posed onto himself due to noncompliance, he was switched to high-dose aspirin. He has been admitted twice in the last 2 months with pneumonia. In the most recent hospitalization, cardiology was consulted on 02/26/2016 for atrial fibrillation elevated troponin. Chest x-ray at the time showed left lower lobe pneumonia. He was felt the elevated troponin most likely related to pneumonia since he was chest pain-free. Atrial fibrillation was rate controlled. No ischemic workup was recommended.  He presents today for follow-up, he is currently living at [facility]. He denies any recurrent signs of fever, chill, or cough. His cough has significantly improved. His breathing also has significantly improved since recent hospitalization. Overall he's feeling much better.    Past Medical History  Diagnosis Date  . Hypertension   . Hyperlipidemia   . Chronic atrial fibrillation ([place])   . Urinary retention foley last changed 3 weeks ago  . Arthritis   . Sleep apnea     no cpap used, sleep study was several yrs ago  . Diabetes mellitus without complication [facility])     Past Surgical History  Procedure Laterality Date  . Back surgery  yrs ago    lower back surgery  . Tonsillectomy  as child  . Prostate surgery  yrs ago, no cancer  .  Transurethral resection of prostate  02/22/2012    Procedure: TRANSURETHRAL RESECTION OF THE PROSTATE WITH GYRUS INSTRUMENTS;  Surgeon: Molli Hazard, MD;  Location: [facility];  Service: Urology;  Laterality: N/A;         Current Outpatient Prescriptions  Medication Sig Dispense Refill  . aspirin EC 325 MG tablet Take 1 tablet (325 mg total) by mouth daily. 30 tablet 0  . atorvastatin (LIPITOR) 20 MG tablet Take 1 tablet (20 mg total) by mouth daily at 6 PM. 30 tablet 0  . donepezil (ARICEPT) 5 MG tablet Take 5 mg by mouth daily.  6  . finasteride (PROSCAR) 5 MG tablet Take 5 mg by mouth daily.    . furosemide (LASIX) 40 MG tablet Take 1 tablet (40 mg total) by mouth daily. 30 tablet 0  . guaiFENesin (MUCINEX) 600 MG 12 hr tablet Take 1,200 mg by mouth every 12 (twelve) hours.    . insulin aspart (NOVOLOG FLEXPEN) 100 UNIT/ML FlexPen Inject 10 Units into the skin 3 (three) times daily with meals. 0-59= hypoglycemia protocol, 60-149= 0 units, 150-250= 5 units, 251-300= 8 units, 301-350= 10 units, >350 Call MD/NP    . insulin glargine (LANTUS) 100 UNIT/ML injection Inject 0.05 mLs (5 Units total) into the skin at bedtime. 10 mL 11  . lactobacillus (FLORANEX/LACTINEX) PACK Take 1 packet (1 g total) by mouth 3 (three) times daily with meals. 30 packet 0  . levalbuterol (XOPENEX) 0.63 MG/3ML nebulizer solution Take 0.63 mg by nebulization every  4 (four) hours as needed for wheezing or shortness of breath.    . metoprolol (LOPRESSOR) 50 MG tablet Take 50 mg by mouth at bedtime.    Marland Kitchen MYRBETRIQ 50 MG TB24 tablet Take 50 mg by mouth daily.  11   No current facility-administered medications for this visit.    Allergies:   Sulfa antibiotics    Social History:  The patient  reports that he has quit smoking. His smoking use included Cigarettes. He has a 25 pack-year smoking history. He has never used smokeless tobacco. He reports that he drinks alcohol. He reports that he does not use illicit  drugs.   Family History:  The patient's family history includes Heart attack in his father.    ROS:  Please see the history of present illness.   Otherwise, review of systems are positive for still coughing but better, SOB improved.   All other systems are reviewed and negative.    PHYSICAL EXAM: VS:  BP 118/68 mmHg  Pulse 84  Ht 5\' 5"  (1.651 m)  Wt 205 lb (92.987 kg)  BMI 34.11 kg/m2 , BMI Body mass index is 34.11 kg/(m^2). GEN: Well nourished, well developed, in no acute distress HEENT: normal Neck: no JVD, carotid bruits, or masses Cardiac: RRR; no murmurs, rubs, or gallops,no edema  Respiratory:  clear to auscultation bilaterally, normal work of breathing GI: soft, nontender, nondistended, + BS MS: no deformity or atrophy Skin: warm and dry, no rash Neuro:  Strength and sensation are intact Psych: euthymic mood, full affect   EKG:  EKG is not ordered today.   Recent Labs: 01/29/2016: TSH 0.650 02/26/2016: B Natriuretic Peptide 171.0* 02/27/2016: ALT 29 02/28/2016: Hemoglobin 12.4*; Platelets 213 03/02/2016: BUN 26*; Creatinine, Ser 1.77*; Potassium 4.2; Sodium 136    Lipid Panel    Component Value Date/Time   CHOL 150 01/29/2016 0342   TRIG 44 01/29/2016 0342   HDL 35* 01/29/2016 0342   CHOLHDL 4.3 01/29/2016 0342   VLDL 9 01/29/2016 0342   LDLCALC 106* 01/29/2016 0342      Wt Readings from Last 3 Encounters:  03/10/16 205 lb (92.987 kg)  03/03/16 198 lb (89.812 kg)  03/02/16 198 lb (89.812 kg)      Other studies Reviewed: Additional studies/ records that were reviewed today include:   Echo 01/31/2016 LV EF: 55% - 60%  ------------------------------------------------------------------- Indications: Atrial fibrillation - 427.31.  ------------------------------------------------------------------- History: PMH: Dyspnea. Bacteremia. Risk factors: Hypertension. Diabetes mellitus.  Dyslipidemia.  ------------------------------------------------------------------- Study Conclusions  - Procedure narrative: Transthoracic echocardiography. Image  quality was poor. The study was technically difficult, as a  result of poor acoustic windows, poor sound wave transmission,  restricted patient mobility, and body habitus. - Left ventricle: The cavity size was normal. There was mild focal  basal hypertrophy of the septum. Systolic function was normal.  The estimated ejection fraction was in the range of 55% to 60%.  Wall motion was normal; there were no regional wall motion  abnormalities. - Aortic valve: There was mild regurgitation. - Mitral valve: There was mild to moderate regurgitation directed  centrally. - Pulmonary arteries: PA peak pressure: 33 mm Hg (S).    Review of the above records demonstrates:   Recurrent admission for PNA, also treated for acute on chronic diastolic HF   ASSESSMENT AND PLAN:  1. Chronic diastolic HF: No lower extremity edema or JVD on physical exam, largely euvolemic, does have bibasilar rale, which is likely atelectasis instead of pulmonary edema. His weight is relatively  stable since he left the hospital. I have discussed with him sodium and fluid restriction and daily weight. He has been instructed to call cardiology if his weight increased by more than 3 pounds overnight or 5 pounds in a single week.  2. Recurrent PNA: stable without recurrence at this time  3. permanent atrial fibrillation: rate controlled  - previously on Xarelto, however switched to ASA due to concern of increased stroke risk as he was only intermittently compliant with Xarelto  - CHA2DS2-Vasc score (HTN, DM)  4. Hypertension: BP well controlled  5. HLD: on low dose lipitor   6. DM II: Hgb A1C 8.6 on 02/26/2016  7. stage III CKD: check BMET  Current medicines are reviewed at length with the patient today.  The patient does not have concerns  regarding medicines.  The following changes have been made:  no change  Labs/ tests ordered today include:  No orders of the defined types were placed in this encounter.     Disposition:   FU with Dr. Burt Knack in 3 months  Signed, [name] [name], [place]  03/10/2016 5:15 PM    [address], [place], [place]  [address] Phone: [phone]; Fax: [phone]

## 2016-03-11 ENCOUNTER — Other Ambulatory Visit: Payer: Self-pay | Admitting: Physician Assistant

## 2016-03-11 DIAGNOSIS — N183 Chronic kidney disease, stage 3 (moderate): Secondary | ICD-10-CM

## 2016-03-11 MED ORDER — POTASSIUM CHLORIDE ER 20 MEQ PO TBCR: 20.0000 meq | EXTENDED_RELEASE_TABLET | Freq: Every day | ORAL | Status: DC

## 2016-03-12 ENCOUNTER — Encounter: Payer: Self-pay | Admitting: Pulmonary Disease

## 2016-03-12 ENCOUNTER — Ambulatory Visit (INDEPENDENT_AMBULATORY_CARE_PROVIDER_SITE_OTHER): Payer: Medicare Other | Admitting: Pulmonary Disease

## 2016-03-12 ENCOUNTER — Ambulatory Visit (INDEPENDENT_AMBULATORY_CARE_PROVIDER_SITE_OTHER)
Admission: RE | Admit: 2016-03-12 | Discharge: 2016-03-12 | Disposition: A | Discharge status: Home / Self Care | Payer: Medicare Other | Source: Ambulatory Visit | Attending: Pulmonary Disease | Admitting: Pulmonary Disease

## 2016-03-12 VITALS — BP 118/64 | HR 83 | Temp 97.7°F | Ht 66.0 in | Wt 206.0 lb

## 2016-03-12 DIAGNOSIS — J449 Chronic obstructive pulmonary disease, unspecified: Secondary | ICD-10-CM | POA: Diagnosis not present

## 2016-03-12 DIAGNOSIS — J189 Pneumonia, unspecified organism: Secondary | ICD-10-CM

## 2016-03-12 DIAGNOSIS — I10 Essential (primary) hypertension: Secondary | ICD-10-CM

## 2016-03-12 DIAGNOSIS — J4489 Other specified chronic obstructive pulmonary disease: Secondary | ICD-10-CM

## 2016-03-12 DIAGNOSIS — J90 Pleural effusion, not elsewhere classified: Secondary | ICD-10-CM | POA: Diagnosis not present

## 2016-03-12 DIAGNOSIS — R0600 Dyspnea, unspecified: Secondary | ICD-10-CM

## 2016-03-12 DIAGNOSIS — N183 Chronic kidney disease, stage 3 (moderate): Secondary | ICD-10-CM

## 2016-03-12 DIAGNOSIS — J9601 Acute respiratory failure with hypoxia: Secondary | ICD-10-CM

## 2016-03-12 DIAGNOSIS — N179 Acute kidney failure, unspecified: Secondary | ICD-10-CM

## 2016-03-12 DIAGNOSIS — N390 Urinary tract infection, site not specified: Secondary | ICD-10-CM | POA: Diagnosis not present

## 2016-03-12 DIAGNOSIS — I4891 Unspecified atrial fibrillation: Secondary | ICD-10-CM

## 2016-03-12 DIAGNOSIS — E119 Type 2 diabetes mellitus without complications: Secondary | ICD-10-CM

## 2016-03-12 HISTORY — DX: Other specified chronic obstructive pulmonary disease: J44.89

## 2016-03-12 HISTORY — DX: Chronic obstructive pulmonary disease, unspecified: J44.9

## 2016-03-12 MED ORDER — IPRATROPIUM-ALBUTEROL 0.5-2.5 (3) MG/3ML IN SOLN: 3.0000 mL | Freq: Four times a day (QID) | RESPIRATORY_TRACT | Status: DC

## 2016-03-12 NOTE — Progress Notes (Signed)
Subjective:     Patient ID: [name] [name], male   DOB: 1926/04/02, 82 y.o.   MRN: [id number]  HPI ~  March 12, 2016:  Initial pulmonary consultation by SN>  His PCP is DrTisovec    82 y/o WM, referred by Lynne Leader, [facility], for a pulmonary consult post hospitalization for pneumonia and acute hypoxemic respiratory failure>  He was [facility] 3/8 - 03/02/16 by [facility] after presenting with increased SOB, cough, sputum, & weakness w/ a fall at home while he was trying to do his home therapy;  He was found to have a low grade temp, stable VS, CXR w/ LLL pneumonia, WBC=14.7, unable to produce sput for culture, Cr=1.54, chr Afib on EKG, BNP=171;  He has a hx of permanent AFib, HBP, & DiastolicCHF- prev on Xarelto but it was stopped due to noncompliance; he had been [facility] in [id number] w/ CAP (but CXR did not show an infiltrate) & was disch home w/ home health PT;  Treated w/ IV Maxipeme & Vanco, Oxygen, NEBS, and Symbicort=> discharged on NEB w/ Xopenex Tid, Doxycycline, Mucinex...       Smoking Hx>  He is an ex-smoker having started in his teens, smoked for 60+yrs but only up to 1/2 ppd at max he says, quit smoking ~2000 & none for the last 76yrs...   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  Current Meds>  NEB w/ Xopenex Q6H, Mucinex600-2Bid, ASA325, MetopER50, Lasix40, Atorva20, Lantus5u, Novolog Tid-SS, Finasteride5, Myrbetriq50, Donepezil5,    EXAM shows elderly man in wheelchair- Afeb, VSS, O2sat=93% on RA; Wt=206#, 5'6"Tall, BMI=33; HEENT- neg, mallampati3;  Chest- congested cough, bilat rhonchi & exp wheezing, no consolidation; Heart- RR gr1/6 SEM w/o r/g; Abd- obese, soft, nontender; Ext- VI, tr edema; Neuro- diffusely weak w/o focal deficits...   CXR   IMP >>      PLAN >>        Past Medical History  Diagnosis Date  . Hypertension   . Hyperlipidemia   . Chronic atrial fibrillation ([place])   . Urinary retention foley last changed 3 weeks ago  . Arthritis   . Sleep apnea     no cpap used, sleep study was several yrs ago  . Diabetes mellitus without complication [facility])     Past Surgical History  Procedure Laterality Date  . Back surgery  yrs ago    lower back surgery  . Tonsillectomy  as child  . Prostate surgery  yrs ago, no cancer  . Transurethral resection of prostate  02/22/2012    Procedure: TRANSURETHRAL RESECTION OF THE PROSTATE WITH GYRUS INSTRUMENTS;  Surgeon: Molli Hazard, MD;  Location: [facility] ORS;  Service: Urology;  Laterality: N/A;        Outpatient Encounter Prescriptions as of 03/12/2016  Medication Sig  . aspirin EC 325 MG tablet Take 1 tablet (325 mg total) by mouth daily.  Marland Kitchen atorvastatin (LIPITOR) 20 MG tablet Take 1 tablet (20 mg total) by mouth daily at 6 PM.  . donepezil (  ARICEPT) 5 MG tablet Take 5 mg by mouth daily.  . finasteride (PROSCAR) 5 MG tablet Take 5 mg by mouth daily.  . furosemide (LASIX) 40 MG tablet Take 1 tablet (40 mg total) by mouth daily.  Marland Kitchen guaiFENesin (MUCINEX) 600 MG 12 hr tablet Take 1,200 mg by mouth every 12 (twelve) hours.  . insulin aspart (NOVOLOG FLEXPEN) 100 UNIT/ML FlexPen Inject 10 Units into the skin 3 (three) times daily with meals. 0-59= hypoglycemia protocol, 60-149= 0 units, 150-250= 5 units, 251-300= 8 units, 301-350= 10 units, >350 Call MD/NP  . insulin glargine (LANTUS) 100 UNIT/ML injection Inject 0.05 mLs (5 Units total) into the skin at bedtime.  Marland Kitchen ipratropium-albuterol (DUONEB) 0.5-2.5 (3) MG/3ML SOLN Take 3 mLs by nebulization 4 (four) times daily.  Marland Kitchen  lactobacillus (FLORANEX/LACTINEX) PACK Take 1 packet (1 g total) by mouth 3 (three) times daily with meals.  Marland Kitchen levalbuterol (XOPENEX) 0.63 MG/3ML nebulizer solution Take 0.63 mg by nebulization every 4 (four) hours as needed for wheezing or shortness of breath.  . metoprolol (LOPRESSOR) 50 MG tablet Take 50 mg by mouth at bedtime.  Marland Kitchen MYRBETRIQ 50 MG TB24 tablet Take 50 mg by mouth daily.  . potassium chloride 20 MEQ TBCR Take 20 mEq by mouth daily.   No facility-administered encounter medications on file as of 03/12/2016.    Allergies  Allergen Reactions  . Sulfa Antibiotics Swelling    hands    Immunization History  Administered Date(s) Administered  . Influenza,inj,Quad PF,36+ Mos 12/22/2015  . PPD Test 03/02/2016  . Pneumococcal-Unspecified 12/21/2014    Family History  Problem Relation Age of Onset  . Heart attack Father     Social History   Social History  . Marital Status: Married    Spouse Name: N/A  . Number of Children: N/A  . Years of Education: N/A   Occupational History  . Not on file.   Social History Main Topics  . Smoking status: Former Smoker -- 0.50 packs/day for 50 years    Types: Cigarettes    Quit date: 12/21/1980  . Smokeless tobacco: Never Used  . Alcohol Use: 0.0 oz/week    0 Standard drinks or equivalent per week     Comment: rare use  . Drug Use: No  . Sexual Activity: No   Other Topics Concern  . Not on file   Social History Narrative    Current Medications, Allergies, Past Medical History, Past Surgical History, Family History, and Social History were reviewed in [employer] record.   Review of Systems             All symptoms NEG except where BOLDED >>  Constitutional:  F/C/S, fatigue, anorexia, unexpected weight change. HEENT:  HA, visual changes, hearing loss, earache, nasal symptoms, sore throat, mouth sores, hoarseness. Resp:  cough, sputum, hemoptysis; SOB, tightness, wheezing. Cardio:  CP, palpit,  DOE, orthopnea, edema. GI:  N/V/D/C, blood in stool; reflux, abd pain, distention, gas. GU:  dysuria, freq, urgency, hematuria, flank pain, voiding difficulty. MS:  joint pain, swelling, tenderness, decr ROM; neck pain, back pain, etc. Neuro:  HA, tremors, seizures, dizziness, syncope, weakness, numbness, gait abn. Skin:  suspicious lesions or skin rash. Heme:  adenopathy, bruising, bleeding. Psyche:  confusion, agitation, sleep disturbance, hallucinations, anxiety, depression suicidal.   Objective:   Physical Exam       Vital Signs:  Reviewed...  General:  WD, WN,    y/o WM in NAD; alert & oriented; pleasant & cooperative.Marland KitchenMarland Kitchen  HEENT:  [place]/AT; Conjunctiva- pink, Sclera- nonicteric, EOM-wnl, PERRLA, Fundi-benign; EACs-clear, TMs-wnl; NOSE-clear; THROAT-clear & wnl. Neck:  Supple w/ full ROM; no JVD; normal carotid impulses w/o bruits; no thyromegaly or nodules palpated; no lymphadenopathy. Chest:  Clear to P & A; without wheezes, rales, or rhonchi heard. Heart:  Regular Rhythm; norm S1 & S2 without murmurs, rubs, or gallops detected. Abdomen:  Soft & nontender- no guarding or rebound; normal bowel sounds; no organomegaly or masses palpated.   Ext:  Normal ROM; without deformities or arthritic changes; no varicose veins, venous insuffic, or edema;  Pulses intact w/o bruits. Neuro:  CNs II-XII intact; motor testing normal; sensory testing normal; gait normal & balance OK. Derm:  No lesions noted; no rash etc. Lymph:  No cervical, supraclavicular, axillary, or inguinal adenopathy palpated.   Assessment:     ***    Plan:     Patient's Medications  New Prescriptions   IPRATROPIUM-ALBUTEROL (DUONEB) 0.5-2.5 (3) MG/3ML SOLN    Take 3 mLs by nebulization 4 (four) times daily.  Previous Medications   ASPIRIN EC 325 MG TABLET    Take 1 tablet (325 mg total) by mouth daily.   ATORVASTATIN (LIPITOR) 20 MG TABLET    Take 1 tablet (20 mg total) by mouth daily at 6 PM.   DONEPEZIL (ARICEPT) 5 MG  TABLET    Take 5 mg by mouth daily.   FINASTERIDE (PROSCAR) 5 MG TABLET    Take 5 mg by mouth daily.   FUROSEMIDE (LASIX) 40 MG TABLET    Take 1 tablet (40 mg total) by mouth daily.   GUAIFENESIN (MUCINEX) 600 MG 12 HR TABLET    Take 1,200 mg by mouth every 12 (twelve) hours.   INSULIN ASPART (NOVOLOG FLEXPEN) 100 UNIT/ML FLEXPEN    Inject 10 Units into the skin 3 (three) times daily with meals. 0-59= hypoglycemia protocol, 60-149= 0 units, 150-250= 5 units, 251-300= 8 units, 301-350= 10 units, >350 Call MD/NP   INSULIN GLARGINE (LANTUS) 100 UNIT/ML INJECTION    Inject 0.05 mLs (5 Units total) into the skin at bedtime.   LACTOBACILLUS (FLORANEX/LACTINEX) PACK    Take 1 packet (1 g total) by mouth 3 (three) times daily with meals.   LEVALBUTEROL (XOPENEX) 0.63 MG/3ML NEBULIZER SOLUTION    Take 0.63 mg by nebulization every 4 (four) hours as needed for wheezing or shortness of breath.   METOPROLOL (LOPRESSOR) 50 MG TABLET    Take 50 mg by mouth at bedtime.   MYRBETRIQ 50 MG TB24 TABLET    Take 50 mg by mouth daily.   POTASSIUM CHLORIDE 20 MEQ TBCR    Take 20 mEq by mouth daily.  Modified Medications   No medications on file  Discontinued Medications   No medications on file

## 2016-03-12 NOTE — Patient Instructions (Signed)
[name] [name] -- it was great meeting you today...  Today we rechecked a CXR...    We will contact you w/ the results when available...   We have adjusted your breathing medications to use in the facility & if you get discharged home>    We wrote for DUONEB to use in the NEBULIZER Machine 4 times daily every day...  Stay on the [facility] 600mg  tabs- 2 tabs twice daily w/ lots of fluid...  Work hard to cough up any phlegm & get it out of your system    In this regard you should use the INCENTIVE SPIROMETER every 1H while awake (take deep breaths IN to expand your lung bases).  Call for any questions...  Let's plan a follow up visit in 2-3 weeks, sooner if needed for breathing problems.Marland KitchenMarland Kitchen

## 2016-03-13 ENCOUNTER — Telehealth: Payer: Self-pay | Admitting: Pulmonary Disease

## 2016-03-13 ENCOUNTER — Telehealth: Payer: Self-pay | Admitting: Cardiovascular Disease

## 2016-03-13 NOTE — Telephone Encounter (Signed)
New Message:  Pt's wife is calling back to get the results to his test . Please f/u

## 2016-03-13 NOTE — Telephone Encounter (Signed)
Notes Recorded by Noralee Space, MD on 03/13/2016 at 8:24 AM Please notify patient>  CXR is improved w/ clearing of LLL pneumonia, better aeration, etc; still w/ low lung volumes 7 has sm right effusion... REC> use the NEB w/ duoneb Qid, use the IS Q1H while awake, use the Mucinex1200mg Bid, concentrate on good deep breaths and clearing any phlegm...  Called and spoke with pt's wife. Reviewed results and recs. She voiced understanding and had no further questions.

## 2016-03-13 NOTE — Progress Notes (Signed)
Quick Note:  lmtcb for pt. ______ 

## 2016-03-13 NOTE — Progress Notes (Signed)
Quick Note:  Called and spoke with pt's wife. Reviewed results and recs. She voiced understanding and had no further questions. ______ 

## 2016-03-17 ENCOUNTER — Non-Acute Institutional Stay (SKILLED_NURSING_FACILITY): Payer: Medicare Other | Admitting: Family

## 2016-03-17 DIAGNOSIS — R269 Unspecified abnormalities of gait and mobility: Secondary | ICD-10-CM | POA: Diagnosis not present

## 2016-03-17 DIAGNOSIS — E785 Hyperlipidemia, unspecified: Secondary | ICD-10-CM | POA: Diagnosis not present

## 2016-03-17 DIAGNOSIS — E119 Type 2 diabetes mellitus without complications: Secondary | ICD-10-CM | POA: Diagnosis not present

## 2016-03-17 DIAGNOSIS — I10 Essential (primary) hypertension: Secondary | ICD-10-CM | POA: Diagnosis not present

## 2016-03-17 DIAGNOSIS — J449 Chronic obstructive pulmonary disease, unspecified: Secondary | ICD-10-CM

## 2016-03-17 NOTE — Telephone Encounter (Signed)
Returned a call that was fwd to me 03/17/16 and lmptcb

## 2016-03-17 NOTE — Telephone Encounter (Signed)
Pt is calling back

## 2016-03-17 NOTE — Progress Notes (Signed)
Patient ID: [name] [name], male   DOB: Dec 14, 1926, 82 y.o.   MRN: [id number]  Location:  [facility]:  SNF [phone])  Provider: Blanchie Serve, MD  PCP: Haywood Pao, MD Patient Care Team: Haywood Pao, MD as PCP - General (Internal Medicine)  Extended Emergency Contact Information Primary Emergency Contact: [facility] Address: [address]          [place], [place] [address] Johnnette Litter of [place] Phone: [phone] Mobile Phone: [phone] Relation: Spouse Secondary Emergency Contact: [place] Mobile Phone: [phone] Relation: Son  Code Status: Full Code  Goals of care:  Advanced Directive information Advanced Directives 02/27/2016  Does patient have an advance directive? Yes  Type of Paramedic of [place];Living will  Does patient want to make changes to advanced directive? No - Patient declined  Copy of advanced directive(s) in chart? No - copy requested  Would patient like information on creating an advanced directive? -     Allergies  Allergen Reactions  . Sulfa Antibiotics Swelling    hands    Chief Complaint  Patient presents with  . Discharge Note    Home     HPI:  82 y.o. male seen today at [facility] for discharge home.He was here for short term rehabilitation post hospital admission from 02/26/16-03/02/16 with acute respiratory failure with sepsis from pneumonia and chf exacerbation. He received antibiotics, lasix and oxygen via [place]. He had elevated troponin and cardiology was consulted. It was thought to be from demand ischemia in setting of sepsis. He has a medical history of Afib, CKD stage 3, DM type 2, HTN among others.He is seen in his room today.He denies any acute issues this visit. He states breathing has improved feeling much better.He is off oxygen with saturation in upper 90's. He has worked with PT/OT now stable for  discharge home with PT/OT to continue with ROM, exercise, gait stability and Muscle strengthening. He will also require a [place] RN for medication management. He does not need any DME states has own walker. Facility Education officer, museum will arrange all [place] services prior to discharge home.      Past Medical History  Diagnosis Date  . Hypertension   . Hyperlipidemia   . Chronic atrial fibrillation ([place])   . Urinary retention foley last changed 3 weeks ago  . Arthritis   . Sleep apnea     no cpap used, sleep study was several yrs ago  . Diabetes mellitus without complication [facility])     Past Surgical History  Procedure Laterality Date  . Back surgery  yrs ago    lower back surgery  . Tonsillectomy  as child  . Prostate surgery  yrs ago, no cancer  . Transurethral resection of prostate  02/22/2012    Procedure: TRANSURETHRAL RESECTION OF THE PROSTATE WITH GYRUS INSTRUMENTS;  Surgeon: Molli Hazard, MD;  Location: [facility]S;  Service: Urology;  Laterality: N/A;          reports that he quit smoking about 35 years ago. His smoking use included Cigarettes. He has a 25 pack-year smoking history. He has never used smokeless tobacco. He reports that he drinks alcohol. He reports that he does not use illicit drugs. Social History   Social History  . Marital Status: Married    Spouse Name: N/A  . Number of Children: N/A  . Years of Education: N/A  Occupational History  . Not on file.   Social History Main Topics  . Smoking status: Former Smoker -- 0.50 packs/day for 50 years    Types: Cigarettes    Quit date: 12/21/1980  . Smokeless tobacco: Never Used  . Alcohol Use: 0.0 oz/week    0 Standard drinks or equivalent per week     Comment: rare use  . Drug Use: No  . Sexual Activity: No   Other Topics Concern  . Not on file   Social History Narrative   Functional Status Survey:    Allergies  Allergen Reactions  . Sulfa Antibiotics Swelling    hands    Pertinent  Health  Maintenance Due  Topic Date Due  . FOOT EXAM  [birth year]05/1936  . OPHTHALMOLOGY EXAM  01[birth year]05/1936  . URINE MICROALBUMIN  01[birth year]05/1936  . PNA vac Low Risk Adult (2 of 2 - PCV13) 12/22/2015  . INFLUENZA VACCINE  07/21/2016  . HEMOGLOBIN A1C  08/28/2016    Medications:   Medication List       This list is accurate as of: 03/17/16  9:02 PM.  Always use your most recent med list.               aspirin EC 325 MG tablet  Take 1 tablet (325 mg total) by mouth daily.     atorvastatin 20 MG tablet  Commonly known as:  LIPITOR  Take 1 tablet (20 mg total) by mouth daily at 6 PM.     donepezil 5 MG tablet  Commonly known as:  ARICEPT  Take 5 mg by mouth daily.     finasteride 5 MG tablet  Commonly known as:  PROSCAR  Take 5 mg by mouth daily.     furosemide 40 MG tablet  Commonly known as:  LASIX  Take 1 tablet (40 mg total) by mouth daily.     guaiFENesin 600 MG 12 hr tablet  Commonly known as:  MUCINEX  Take 1,200 mg by mouth every 12 (twelve) hours.     insulin glargine 100 UNIT/ML injection  Commonly known as:  LANTUS  Inject 0.05 mLs (5 Units total) into the skin at bedtime.     ipratropium-albuterol 0.5-2.5 (3) MG/3ML Soln  Commonly known as:  DUONEB  Take 3 mLs by nebulization 4 (four) times daily.     lactobacillus Pack  Take 1 packet (1 g total) by mouth 3 (three) times daily with meals.     metoprolol 50 MG tablet  Commonly known as:  LOPRESSOR  Take 50 mg by mouth at bedtime.     MYRBETRIQ 50 MG Tb24 tablet  Generic drug:  mirabegron ER  Take 50 mg by mouth daily.     NOVOLOG FLEXPEN 100 UNIT/ML FlexPen  Generic drug:  insulin aspart  Inject 10 Units into the skin 3 (three) times daily with meals. 0-59= hypoglycemia protocol, 60-149= 0 units, 150-250= 5 units, 251-300= 8 units, 301-350= 10 units, >350 Call MD/NP     Potassium Chloride ER 20 MEQ Tbcr  Take 20 mEq by mouth daily.        Review of Systems  Constitutional: Negative for fever, chills,  activity change and fatigue.  HENT: Negative.   Eyes: Negative.   Respiratory: Negative for cough, chest tightness, shortness of breath and wheezing.   Cardiovascular: Negative for chest pain, palpitations and leg swelling.  Gastrointestinal: Negative for nausea, vomiting, abdominal pain, diarrhea, constipation and abdominal distention.  Endocrine: Negative.   Genitourinary: Negative.  Musculoskeletal: Positive for gait problem.  Skin: Negative.   Allergic/Immunologic: Negative.   Neurological: Negative.   Hematological: Negative.   Psychiatric/Behavioral: Negative.     Filed Vitals:   03/17/16 1100  BP: 107/71  Pulse: 81  Temp: 97.4 F (36.3 C)  Resp: 20  Height: 5\' 5"  (1.651 m)  Weight: 205 lb (92.987 kg)  SpO2: 96%   Body mass index is 34.11 kg/(m^2). Physical Exam  Constitutional: He appears well-developed and well-nourished. No distress.  HENT:  Head: Normocephalic.  Mouth/Throat: Oropharynx is clear and moist.  Eyes: Conjunctivae and EOM are normal. Pupils are equal, round, and reactive to light. Right eye exhibits no discharge. Left eye exhibits no discharge. No scleral icterus.  Neck: Normal range of motion. No JVD present. No thyromegaly present.  Cardiovascular: Normal rate, regular rhythm, normal heart sounds and intact distal pulses.  Exam reveals no gallop and no friction rub.   No murmur heard. Pulmonary/Chest: Effort normal and breath sounds normal. No respiratory distress. He has no wheezes. He has no rales.  Abdominal: Soft. Bowel sounds are normal. He exhibits no mass. There is no tenderness. There is no rebound and no guarding.  Musculoskeletal: Normal range of motion. He exhibits no edema or tenderness.  Lymphadenopathy:    He has no cervical adenopathy.  Neurological: He is alert.  Skin: Skin is warm and dry. No rash noted. No erythema. No pallor.  Psychiatric: He has a normal mood and affect.    Labs reviewed: Basic Metabolic Panel:  Recent  Labs  03/01/16 0427 03/02/16 03/02/16 0405 03/10/16 1221  NA 138 136* 136 138  K 4.2  --  4.2 3.6  CL 98*  --  96* 99  CO2 30  --  30 26  GLUCOSE 153*  --  210* 155*  BUN 23* 26* 26* 29*  CREATININE 1.74* 1.8* 1.77* 1.91*  CALCIUM 9.0  --  9.0 9.0   Liver Function Tests:  Recent Labs  01/28/16 1758 02/27/16 1106  AST 30 22  ALT 26 29  ALKPHOS 75 68  BILITOT 0.9 0.6  PROT 8.0 6.7  ALBUMIN 4.0 2.5*   No results for input(s): LIPASE, AMYLASE in the last 8760 hours. No results for input(s): AMMONIA in the last 8760 hours. CBC:  Recent Labs  04/21/15 0424  01/28/16 1758  02/26/16 1359 02/26/16 2023 02/28/16 02/28/16 0420  WBC 11.7*  < > 15.8*  < > 14.7* 14.4* 10.5 10.5  NEUTROABS 9.2*  --  13.7*  --  11.8*  --   --   --   HGB 14.7  < > 14.3  < > 13.5 13.4  --  12.4*  HCT 42.2  < > 42.9  < > 39.6 39.8  --  37.8*  MCV 94.2  < > 96.4  < > 95.9 96.1  --  95.2  PLT 180  < > 163  < > 220 222  --  213  < > = values in this interval not displayed. Cardiac Enzymes:  Recent Labs  02/26/16 1359 02/26/16 2151 02/27/16 0458  TROPONINI 1.04* 1.01* 1.13*   BNP: Invalid input(s): POCBNP CBG:  Recent Labs  03/01/16 2127 03/02/16 0638 03/02/16 1115  GLUCAP 140* 223* 217*    Procedures and Imaging Studies During Stay: Dg Chest 2 View  03/12/2016  CLINICAL DATA:  Pneumonia follow-up. EXAM: CHEST  2 VIEW COMPARISON:  CT 02/27/2016.  Chest x-ray 02/27/2016. FINDINGS: Mediastinum and hilar structures normal. Heart size stable. No pulmonary  venous congestion Interim partial clearing of bilateral multifocal infiltrates Low lung volumes with mild basilar atelectasis. Small right pleural effusion. IMPRESSION: Interim partial clearing of bilateral multifocal infiltrates. Low lung volumes with mild bibasilar atelectasis. Small right pleural effusion. Electronically Signed   By: [name]  Register   On: 03/12/2016 16:06   Dg Chest 2 View  02/27/2016  CLINICAL DATA:  Shortness of  Breath EXAM: CHEST  2 VIEW COMPARISON:  02/26/2016 FINDINGS: Cardiac shadow is stable. The lungs are well aerated bilaterally. Mild left basilar infiltrate is again identified. The overall inspiratory effort is poor with crowding of the vascular markings. Aortic calcifications are again seen. No bony abnormality is noted. IMPRESSION: Stable left lower lobe infiltrate. Electronically Signed   By: Inez Catalina M.D.   On: 02/27/2016 08:20   Dg Chest 2 View  02/26/2016  CLINICAL DATA:  Shortness of breath and cough EXAM: CHEST  2 VIEW COMPARISON:  01/31/2016 FINDINGS: Cardiac shadow within normal limits. The lungs are well aerated bilaterally. Left lower lobe infiltrate are noted. No acute bony abnormality is seen. IMPRESSION: Left lower lobe infiltrate. Electronically Signed   By: Inez Catalina M.D.   On: 02/26/2016 14:38   Ct Chest Wo Contrast  02/27/2016  CLINICAL DATA:  Pneumonia. EXAM: CT CHEST WITHOUT CONTRAST TECHNIQUE: Multidetector CT imaging of the chest was performed following the standard protocol without IV contrast. COMPARISON:  Chest x-ray 02/27/2016, chest CT 06/08/2005 FINDINGS: Heart: There is calcifications the coronary arteries. No pericardial effusion. Heart size is normal. Vascular structures: Tortuous great vessels. Thoracic aorta is not aneurysmal. Mediastinum/thyroid: The visualized portion of the thyroid gland has a normal appearance. Numerous small mediastinal and hilar lymph nodes are present. These do not reach criteria for pathologic enlargement. Lungs/Airways: There are multiple pulmonary opacities, more confluent at the left lung base. Opacities are discrete and somewhat rounded in appearance in the upper lobes bilaterally, making it difficult to exclude superimposed metastatic disease as well as infectious process. Upper abdomen: The gallbladder is present. Left renal cyst is present. There is colonic diverticulosis. Chest wall/osseous structures: Multiple vertebral hemangiomata. No  acute osseous abnormality. IMPRESSION: 1. Multifocal pulmonary opacities, consistent with infectious process. Typical and atypical infections such as fungal infections and tuberculosis could have this appearance. 2. Because of the discrete, rounded appearance of numerous densities in the upper lobes, follow-up CT is recommended following clinical improvement in 3-6 months to exclude metastatic disease. 3. Small mediastinal and hilar lymph nodes may be reactive. 4. Coronary artery disease. 5. Left renal cyst. 6. Colonic diverticulosis. Electronically Signed   By: Nolon Nations M.D.   On: 02/27/2016 21:36    Assessment/Plan:   Hypertension B/p Stable. Continue current medication. PCP to monitor BMP COPD S/p short term rehabilitation post hospital admission from 02/26/16-03/02/16 with acute respiratory failure with sepsis from pneumonia and chf exacerbation. He received antibiotics, lasix and oxygen via [place].Afebrile. No shortness of breath or wheezing. Current off oxygen with good saturation in upper 90's. Continue Mucinex and Duoneb's.   Type 2 DM CBG's averages 110's-200's. Continue on Lantus and Novolog Flex pen per SSI. HH RN for medication management. Follow up with PCP to monitor Hgb A1C   Hyperlipidemia  Continue on Atorvastatin. PCP to monitor Lipid panel.   Abnormal Gait  Has improved with PT/OT will discharge home with PT/OT to continue with ROM, exercise, gait stability and Muscle strengthening.   Patient is being discharged with the following home health services:     PT/OT to continue  with ROM, exercise, gait stability and Muscle strengthening.   He will also require a [place] RN for medication management. He does not need any DME states has own walker..      Patient is being discharged with the following durable medical equipment:   NO DME required.   Rx written x 1 month supply   Patient has been advised to f/u with their PCP in 1-2 weeks to bring them up to date on their rehab  stay.  Social services at facility was responsible for arranging this appointment.  Pt was provided with a 30 day supply of prescriptions for medications and refills must be obtained from their PCP.  For controlled substances, a more limited supply may be provided adequate until PCP appointment only.  Future labs/tests needed:  CBC, BMP, Hgb A1C and Lipid panel with PCP

## 2016-03-17 NOTE — Telephone Encounter (Signed)
Returned pt's wife, Newt Minion, phone call re: lab results.  I did not see a DPR on file for pt so I explained to the wife. She will have pt call me back.

## 2016-03-24 DIAGNOSIS — J159 Unspecified bacterial pneumonia: Secondary | ICD-10-CM | POA: Diagnosis not present

## 2016-03-24 DIAGNOSIS — I480 Paroxysmal atrial fibrillation: Secondary | ICD-10-CM | POA: Diagnosis not present

## 2016-03-24 DIAGNOSIS — F0390 Unspecified dementia without behavioral disturbance: Secondary | ICD-10-CM | POA: Diagnosis not present

## 2016-03-24 DIAGNOSIS — Z6835 Body mass index (BMI) 35.0-35.9, adult: Secondary | ICD-10-CM | POA: Diagnosis not present

## 2016-03-24 DIAGNOSIS — E1129 Type 2 diabetes mellitus with other diabetic kidney complication: Secondary | ICD-10-CM | POA: Diagnosis not present

## 2016-03-24 DIAGNOSIS — I10 Essential (primary) hypertension: Secondary | ICD-10-CM | POA: Diagnosis not present

## 2016-03-24 DIAGNOSIS — I509 Heart failure, unspecified: Secondary | ICD-10-CM | POA: Diagnosis not present

## 2016-03-25 ENCOUNTER — Telehealth: Payer: Self-pay | Admitting: Cardiovascular Disease

## 2016-03-25 NOTE — Telephone Encounter (Signed)
I will forward this message to Jeanann Lewandowsky since she is who contacted pt in regards to results.

## 2016-03-25 NOTE — Telephone Encounter (Signed)
Returned pts call.  Discussed lab results. Pt and wife verbalized understanding.

## 2016-03-25 NOTE — Telephone Encounter (Signed)
New message  Pts wife returned call with the pt present to receive results. Please call

## 2016-03-26 ENCOUNTER — Other Ambulatory Visit (INDEPENDENT_AMBULATORY_CARE_PROVIDER_SITE_OTHER): Payer: Medicare Other | Admitting: *Deleted

## 2016-03-26 DIAGNOSIS — N183 Chronic kidney disease, stage 3 (moderate): Secondary | ICD-10-CM

## 2016-03-26 LAB — BASIC METABOLIC PANEL
BUN: 16 mg/dL (ref 7–25)
CO2: 26 mmol/L (ref 20–31)
Calcium: 9.3 mg/dL (ref 8.6–10.3)
Chloride: 97 mmol/L — ABNORMAL LOW (ref 98–110)
Creat: 1.62 mg/dL — ABNORMAL HIGH (ref 0.70–1.11)
GLUCOSE: 125 mg/dL — AB (ref 65–99)
POTASSIUM: 3.6 mmol/L (ref 3.5–5.3)
Sodium: 138 mmol/L (ref 135–146)

## 2016-04-03 DIAGNOSIS — N183 Chronic kidney disease, stage 3 (moderate): Secondary | ICD-10-CM | POA: Diagnosis not present

## 2016-04-03 DIAGNOSIS — J9621 Acute and chronic respiratory failure with hypoxia: Secondary | ICD-10-CM | POA: Diagnosis not present

## 2016-04-03 DIAGNOSIS — E1122 Type 2 diabetes mellitus with diabetic chronic kidney disease: Secondary | ICD-10-CM | POA: Diagnosis not present

## 2016-04-03 DIAGNOSIS — J449 Chronic obstructive pulmonary disease, unspecified: Secondary | ICD-10-CM | POA: Diagnosis not present

## 2016-04-03 DIAGNOSIS — I130 Hypertensive heart and chronic kidney disease with heart failure and stage 1 through stage 4 chronic kidney disease, or unspecified chronic kidney disease: Secondary | ICD-10-CM | POA: Diagnosis not present

## 2016-04-03 DIAGNOSIS — I5033 Acute on chronic diastolic (congestive) heart failure: Secondary | ICD-10-CM | POA: Diagnosis not present

## 2016-04-07 DIAGNOSIS — I130 Hypertensive heart and chronic kidney disease with heart failure and stage 1 through stage 4 chronic kidney disease, or unspecified chronic kidney disease: Secondary | ICD-10-CM | POA: Diagnosis not present

## 2016-04-07 DIAGNOSIS — J9621 Acute and chronic respiratory failure with hypoxia: Secondary | ICD-10-CM | POA: Diagnosis not present

## 2016-04-07 DIAGNOSIS — E1122 Type 2 diabetes mellitus with diabetic chronic kidney disease: Secondary | ICD-10-CM | POA: Diagnosis not present

## 2016-04-07 DIAGNOSIS — I5033 Acute on chronic diastolic (congestive) heart failure: Secondary | ICD-10-CM | POA: Diagnosis not present

## 2016-04-07 DIAGNOSIS — N183 Chronic kidney disease, stage 3 (moderate): Secondary | ICD-10-CM | POA: Diagnosis not present

## 2016-04-07 DIAGNOSIS — J449 Chronic obstructive pulmonary disease, unspecified: Secondary | ICD-10-CM | POA: Diagnosis not present

## 2016-04-09 ENCOUNTER — Telehealth: Payer: Self-pay | Admitting: Pulmonary Disease

## 2016-04-09 ENCOUNTER — Encounter: Payer: Self-pay | Admitting: Pulmonary Disease

## 2016-04-09 ENCOUNTER — Ambulatory Visit (INDEPENDENT_AMBULATORY_CARE_PROVIDER_SITE_OTHER): Payer: Medicare Other | Admitting: Pulmonary Disease

## 2016-04-09 VITALS — BP 116/80 | HR 97 | Temp 98.6°F | Ht 64.5 in | Wt 208.6 lb

## 2016-04-09 DIAGNOSIS — J4489 Other specified chronic obstructive pulmonary disease: Secondary | ICD-10-CM

## 2016-04-09 DIAGNOSIS — C61 Malignant neoplasm of prostate: Secondary | ICD-10-CM | POA: Diagnosis not present

## 2016-04-09 DIAGNOSIS — J449 Chronic obstructive pulmonary disease, unspecified: Secondary | ICD-10-CM

## 2016-04-09 DIAGNOSIS — J189 Pneumonia, unspecified organism: Secondary | ICD-10-CM

## 2016-04-09 DIAGNOSIS — J9601 Acute respiratory failure with hypoxia: Secondary | ICD-10-CM

## 2016-04-09 HISTORY — DX: Acute respiratory failure with hypoxia: J96.01

## 2016-04-09 MED ORDER — IPRATROPIUM-ALBUTEROL 0.5-2.5 (3) MG/3ML IN SOLN: 3.0000 mL | Freq: Three times a day (TID) | RESPIRATORY_TRACT | Status: DC

## 2016-04-09 NOTE — Patient Instructions (Signed)
Today we updated your med list in our EPIC system...    Continue your current medications the same...  We wrote a new prescription for the DUONEB medication to put in your NEBULIZER machine three times daily...  Use the INCENTIVE SPIROMETER devise every 1H while awake to help keep your lungs well expanded...  Use the OTC MUCINEX 600mg  tabs- take 2 tabs twice daily w/ lots of fluids...   Call for any questions...  Let's plan a follow up visit in 5mo, sooner if needed for breathing problems.Marland KitchenMarland Kitchen

## 2016-04-09 NOTE — Telephone Encounter (Signed)
Called [facility] and spoke with pharm tech Shiny who reported pt's Duoneb rx needs to have the dx code on it in order to be filed under Medicare and Part D instead of Part B.  Advised Shiny will resend rx >> done Nothing further needed; will sign off.

## 2016-04-16 DIAGNOSIS — C61 Malignant neoplasm of prostate: Secondary | ICD-10-CM | POA: Diagnosis not present

## 2016-04-16 DIAGNOSIS — N3942 Incontinence without sensory awareness: Secondary | ICD-10-CM | POA: Diagnosis not present

## 2016-04-20 ENCOUNTER — Telehealth: Payer: Self-pay | Admitting: Pulmonary Disease

## 2016-04-20 DIAGNOSIS — J449 Chronic obstructive pulmonary disease, unspecified: Secondary | ICD-10-CM | POA: Diagnosis not present

## 2016-04-20 MED ORDER — IPRATROPIUM-ALBUTEROL 0.5-2.5 (3) MG/3ML IN SOLN: 3.0000 mL | Freq: Three times a day (TID) | RESPIRATORY_TRACT | Status: DC

## 2016-04-20 NOTE — Telephone Encounter (Signed)
lmtcb x1 for pt's wife. Need to clarify which [facility] rx needs to go to.

## 2016-04-20 NOTE — Telephone Encounter (Signed)
Pt wife returning call.[name] [name]' °

## 2016-04-20 NOTE — Telephone Encounter (Signed)
Spoke with pt's wife.  Requesting duoneb rx to go to [facility].  Rx sent.  Wife aware to call back if problems filling rx.  She verbalized understanding, is in agreement with plan, and voiced no further questions or concerns at this time.

## 2016-05-06 DIAGNOSIS — E668 Other obesity: Secondary | ICD-10-CM | POA: Diagnosis not present

## 2016-05-06 DIAGNOSIS — M6289 Other specified disorders of muscle: Secondary | ICD-10-CM | POA: Diagnosis not present

## 2016-05-06 DIAGNOSIS — F0390 Unspecified dementia without behavioral disturbance: Secondary | ICD-10-CM | POA: Diagnosis not present

## 2016-05-06 DIAGNOSIS — R808 Other proteinuria: Secondary | ICD-10-CM | POA: Diagnosis not present

## 2016-05-06 DIAGNOSIS — C61 Malignant neoplasm of prostate: Secondary | ICD-10-CM | POA: Diagnosis not present

## 2016-05-06 DIAGNOSIS — Z7901 Long term (current) use of anticoagulants: Secondary | ICD-10-CM | POA: Diagnosis not present

## 2016-05-06 DIAGNOSIS — I480 Paroxysmal atrial fibrillation: Secondary | ICD-10-CM | POA: Diagnosis not present

## 2016-05-06 DIAGNOSIS — I130 Hypertensive heart and chronic kidney disease with heart failure and stage 1 through stage 4 chronic kidney disease, or unspecified chronic kidney disease: Secondary | ICD-10-CM | POA: Diagnosis not present

## 2016-05-06 DIAGNOSIS — I509 Heart failure, unspecified: Secondary | ICD-10-CM | POA: Diagnosis not present

## 2016-05-06 DIAGNOSIS — E1129 Type 2 diabetes mellitus with other diabetic kidney complication: Secondary | ICD-10-CM | POA: Diagnosis not present

## 2016-05-12 DIAGNOSIS — E1129 Type 2 diabetes mellitus with other diabetic kidney complication: Secondary | ICD-10-CM | POA: Diagnosis not present

## 2016-05-25 ENCOUNTER — Ambulatory Visit (INDEPENDENT_AMBULATORY_CARE_PROVIDER_SITE_OTHER): Payer: Medicare Other | Admitting: Cardiovascular Disease

## 2016-05-25 ENCOUNTER — Encounter: Payer: Self-pay | Admitting: Cardiovascular Disease

## 2016-05-25 VITALS — BP 122/74 | HR 64 | Ht 65.0 in | Wt 208.8 lb

## 2016-05-25 DIAGNOSIS — I482 Chronic atrial fibrillation, unspecified: Secondary | ICD-10-CM

## 2016-05-25 DIAGNOSIS — I5032 Chronic diastolic (congestive) heart failure: Secondary | ICD-10-CM

## 2016-05-25 MED ORDER — FUROSEMIDE 20 MG PO TABS: 20.0000 mg | ORAL_TABLET | Freq: Every day | ORAL | Status: DC

## 2016-05-25 NOTE — Progress Notes (Signed)
Cardiology Office Note Date:  05/25/2016   ID:  CONNARD THORN, DOB [birth year], MRN [id number]  PCP:  Haywood Pao, MD  Cardiologist:  Sherren Mocha, MD    Chief Complaint  Patient presents with  . Follow-up  . Atrial Fibrillation   History of Present Illness: [name] [name] is a 82 y.o. male who presents for follow-up evaluation. The patient has chronic atrial fibrillation, chronic diastolic heart failure, COPD, diabetes, and obesity. He has been hospitalized twice in the last year with pneumonia. He was seen by the cardiology team on both occasions because of elevated troponin and rapid heart rate.  The patient is here with his wife today. He reports no changes in symptoms. Exertional dyspnea persists but has not progressed. He feels good at rest. He remains very sedentary. He denies chest pain, heart palpitations, or dizziness. He continues to struggle with gait unsteadiness. He ambulates with a walker at all times.  Past Medical History  Diagnosis Date  . Hypertension   . Hyperlipidemia   . Chronic atrial fibrillation ([place])   . Urinary retention foley last changed 3 weeks ago  . Arthritis   . Sleep apnea     no cpap used, sleep study was several yrs ago  . Diabetes mellitus without complication [facility])     Past Surgical History  Procedure Laterality Date  . Back surgery  yrs ago    lower back surgery  . Tonsillectomy  as child  . Prostate surgery  yrs ago, no cancer  . Transurethral resection of prostate  02/22/2012    Procedure: TRANSURETHRAL RESECTION OF THE PROSTATE WITH GYRUS INSTRUMENTS;  Surgeon: Molli Hazard, MD;  Location: [facility];  Service: Urology;  Laterality: N/A;        Current Outpatient Prescriptions  Medication Sig Dispense Refill  . aspirin EC 325 MG tablet Take 1 tablet (325 mg total) by mouth daily. 30 tablet 0  . donepezil (ARICEPT) 5 MG tablet Take 5 mg by mouth daily.  6  . finasteride (PROSCAR) 5 MG tablet Take 5 mg by mouth  daily.    Marland Kitchen guaiFENesin (MUCINEX) 600 MG 12 hr tablet Take 1,200 mg by mouth every 12 (twelve) hours. Reported on 04/09/2016    . metoprolol (LOPRESSOR) 50 MG tablet Take 50 mg by mouth at bedtime.    Marland Kitchen oxybutynin (DITROPAN-XL) 5 MG 24 hr tablet Take 5 mg by mouth daily.  3   No current facility-administered medications for this visit.    Allergies:   Sulfa antibiotics   Social History:  The patient  reports that he quit smoking about 35 years ago. His smoking use included Cigarettes. He has a 25 pack-year smoking history. He has never used smokeless tobacco. He reports that he drinks alcohol. He reports that he does not use illicit drugs.   Family History:  The patient's family history includes Heart attack in his father.   ROS:  Please see the history of present illness.  Otherwise, review of systems is positive for appetite change, back pain, balance problems.  All other systems are reviewed and negative.   PHYSICAL EXAM: VS:  BP 122/74 mmHg  Pulse 64  Ht 5\' 5"  (1.651 m)  Wt 208 lb 12.8 oz (94.711 kg)  BMI 34.75 kg/m2 , BMI Body mass index is 34.75 kg/(m^2). GEN: elderly chronically ill-appearing male, in no acute distress HEENT: normal Neck: JVP mildly elevated, no masses. No carotid bruits Cardiac: irregular without murmur or gallop  Respiratory:  clear to auscultation bilaterally, normal work of breathing GI: soft, nontender, nondistended, + BS MS: no deformity or atrophy Ext: 1+ edema on the left, trace edema on the right Skin: There is chronic stasis dermatitis of the left lower leg Neuro:  Strength and sensation are intact Psych: euthymic mood, full affect  EKG:  EKG is not ordered today.  Recent Labs: 01/29/2016: TSH 0.650 02/26/2016: B Natriuretic Peptide 171.0* 02/27/2016: ALT 29 02/28/2016: Hemoglobin 12.4*; Platelets 213 03/26/2016: BUN 16; Creat 1.62*; Potassium 3.6; Sodium 138   Lipid Panel     Component Value Date/Time   CHOL 150 01/29/2016 0342    TRIG 44 01/29/2016 0342   HDL 35* 01/29/2016 0342   CHOLHDL 4.3 01/29/2016 0342   VLDL 9 01/29/2016 0342   LDLCALC 106* 01/29/2016 0342      Wt Readings from Last 3 Encounters:  05/25/16 208 lb 12.8 oz (94.711 kg)  04/09/16 208 lb 9.6 oz (94.62 kg)  03/17/16 205 lb (92.987 kg)     Cardiac Studies Reviewed: 2D Echo 01-31-2016: Study Conclusions  - Procedure narrative: Transthoracic echocardiography. Image  quality was poor. The study was technically difficult, as a  result of poor acoustic windows, poor sound wave transmission,  restricted patient mobility, and body habitus. - Left ventricle: The cavity size was normal. There was mild focal  basal hypertrophy of the septum. Systolic function was normal.  The estimated ejection fraction was in the range of 55% to 60%.  Wall motion was normal; there were no regional wall motion  abnormalities. - Aortic valve: There was mild regurgitation. - Mitral valve: There was mild to moderate regurgitation directed  centrally. - Pulmonary arteries: PA peak pressure: 33 mm Hg (S).   ASSESSMENT AND PLAN: 1.  Chronic diastolic heart failure, NYHA functional class II. Mild volume excess on exam. Add furosemide 20 mg. He was previously on 40 mg but this fell off of his medication list. He has urinary incontinence and multiple other comorbid problems. Will FU in 6 months.  2. Permanent atrial fibrillation: The patient has been extremely noncompliant and he is not a candidate for chronic anticoagulation. He will continue on aspirin 325 mg. CHADS-Vasc score is 5 based on age greater than 76, hypertension, diabetes, and congestive heart failure.  3. Stage III CKD: labs reviewed. Overall stable.   4. HTN: BP controlled.   Current medicines are reviewed with the patient today.  The patient does not have concerns regarding medicines.  Labs/ tests ordered today include:  No orders of the defined types were placed in this encounter.     Disposition:   FU 6 months  Signed, Sherren Mocha, MD  05/25/2016 9:41 AM    [facility] [place], [place], [place]  [address] Phone: [phone]; Fax: [phone]

## 2016-05-25 NOTE — Patient Instructions (Signed)
Medication Instructions:  Your physician has recommended you make the following change in your medication:  1. START Furosemide 20mg  take one tablet by mouth daily  Labwork: No new orders.   Testing/Procedures: No new orders.   Follow-Up: Your physician wants you to follow-up in: 6 MONTHS with Dr Burt Knack.  You will receive a reminder letter in the mail two months in advance. If you don't receive a letter, please call our office to schedule the follow-up appointment.   Any Other Special Instructions Will Be Listed Below (If Applicable).     If you need a refill on your cardiac medications before your next appointment, please call your pharmacy.

## 2016-06-02 DIAGNOSIS — N183 Chronic kidney disease, stage 3 (moderate): Secondary | ICD-10-CM | POA: Diagnosis not present

## 2016-06-02 DIAGNOSIS — E1122 Type 2 diabetes mellitus with diabetic chronic kidney disease: Secondary | ICD-10-CM | POA: Diagnosis not present

## 2016-06-02 DIAGNOSIS — I5033 Acute on chronic diastolic (congestive) heart failure: Secondary | ICD-10-CM | POA: Diagnosis not present

## 2016-06-02 DIAGNOSIS — I130 Hypertensive heart and chronic kidney disease with heart failure and stage 1 through stage 4 chronic kidney disease, or unspecified chronic kidney disease: Secondary | ICD-10-CM | POA: Diagnosis not present

## 2016-06-02 DIAGNOSIS — J449 Chronic obstructive pulmonary disease, unspecified: Secondary | ICD-10-CM | POA: Diagnosis not present

## 2016-06-02 DIAGNOSIS — J9621 Acute and chronic respiratory failure with hypoxia: Secondary | ICD-10-CM | POA: Diagnosis not present

## 2016-06-09 ENCOUNTER — Ambulatory Visit: Payer: Medicare Other | Admitting: Pulmonary Disease

## 2016-06-26 DIAGNOSIS — E1129 Type 2 diabetes mellitus with other diabetic kidney complication: Secondary | ICD-10-CM | POA: Diagnosis not present

## 2016-07-13 DIAGNOSIS — Z85828 Personal history of other malignant neoplasm of skin: Secondary | ICD-10-CM | POA: Diagnosis not present

## 2016-07-13 DIAGNOSIS — D0461 Carcinoma in situ of skin of right upper limb, including shoulder: Secondary | ICD-10-CM | POA: Diagnosis not present

## 2016-07-13 DIAGNOSIS — D485 Neoplasm of uncertain behavior of skin: Secondary | ICD-10-CM | POA: Diagnosis not present

## 2016-07-13 DIAGNOSIS — D225 Melanocytic nevi of trunk: Secondary | ICD-10-CM | POA: Diagnosis not present

## 2016-07-13 DIAGNOSIS — L570 Actinic keratosis: Secondary | ICD-10-CM | POA: Diagnosis not present

## 2016-07-13 DIAGNOSIS — L821 Other seborrheic keratosis: Secondary | ICD-10-CM | POA: Diagnosis not present

## 2016-08-10 DIAGNOSIS — C44622 Squamous cell carcinoma of skin of right upper limb, including shoulder: Secondary | ICD-10-CM | POA: Diagnosis not present

## 2016-08-17 DIAGNOSIS — N3942 Incontinence without sensory awareness: Secondary | ICD-10-CM | POA: Diagnosis not present

## 2016-08-17 DIAGNOSIS — R350 Frequency of micturition: Secondary | ICD-10-CM | POA: Diagnosis not present

## 2016-08-17 DIAGNOSIS — N3941 Urge incontinence: Secondary | ICD-10-CM | POA: Diagnosis not present

## 2016-08-20 DIAGNOSIS — I10 Essential (primary) hypertension: Secondary | ICD-10-CM | POA: Diagnosis not present

## 2016-08-20 DIAGNOSIS — I509 Heart failure, unspecified: Secondary | ICD-10-CM | POA: Diagnosis not present

## 2016-08-20 DIAGNOSIS — Z6834 Body mass index (BMI) 34.0-34.9, adult: Secondary | ICD-10-CM | POA: Diagnosis not present

## 2016-08-20 DIAGNOSIS — E1129 Type 2 diabetes mellitus with other diabetic kidney complication: Secondary | ICD-10-CM | POA: Diagnosis not present

## 2016-08-20 DIAGNOSIS — N318 Other neuromuscular dysfunction of bladder: Secondary | ICD-10-CM | POA: Diagnosis not present

## 2016-08-20 DIAGNOSIS — E7800 Pure hypercholesterolemia, unspecified: Secondary | ICD-10-CM | POA: Diagnosis not present

## 2016-08-20 DIAGNOSIS — I130 Hypertensive heart and chronic kidney disease with heart failure and stage 1 through stage 4 chronic kidney disease, or unspecified chronic kidney disease: Secondary | ICD-10-CM | POA: Diagnosis not present

## 2016-08-20 DIAGNOSIS — N183 Chronic kidney disease, stage 3 (moderate): Secondary | ICD-10-CM | POA: Diagnosis not present

## 2016-08-20 DIAGNOSIS — C61 Malignant neoplasm of prostate: Secondary | ICD-10-CM | POA: Diagnosis not present

## 2016-08-20 DIAGNOSIS — G4733 Obstructive sleep apnea (adult) (pediatric): Secondary | ICD-10-CM | POA: Diagnosis not present

## 2016-08-25 DIAGNOSIS — I130 Hypertensive heart and chronic kidney disease with heart failure and stage 1 through stage 4 chronic kidney disease, or unspecified chronic kidney disease: Secondary | ICD-10-CM | POA: Diagnosis not present

## 2016-09-02 IMAGING — DX DG CHEST 2V
2 series · 2 of 2 positions shown · non-contrast
Comparison: 01/31/2016

CLINICAL DATA: Shortness of breath and cough

EXAM:
CHEST  2 VIEW

[chest lat]
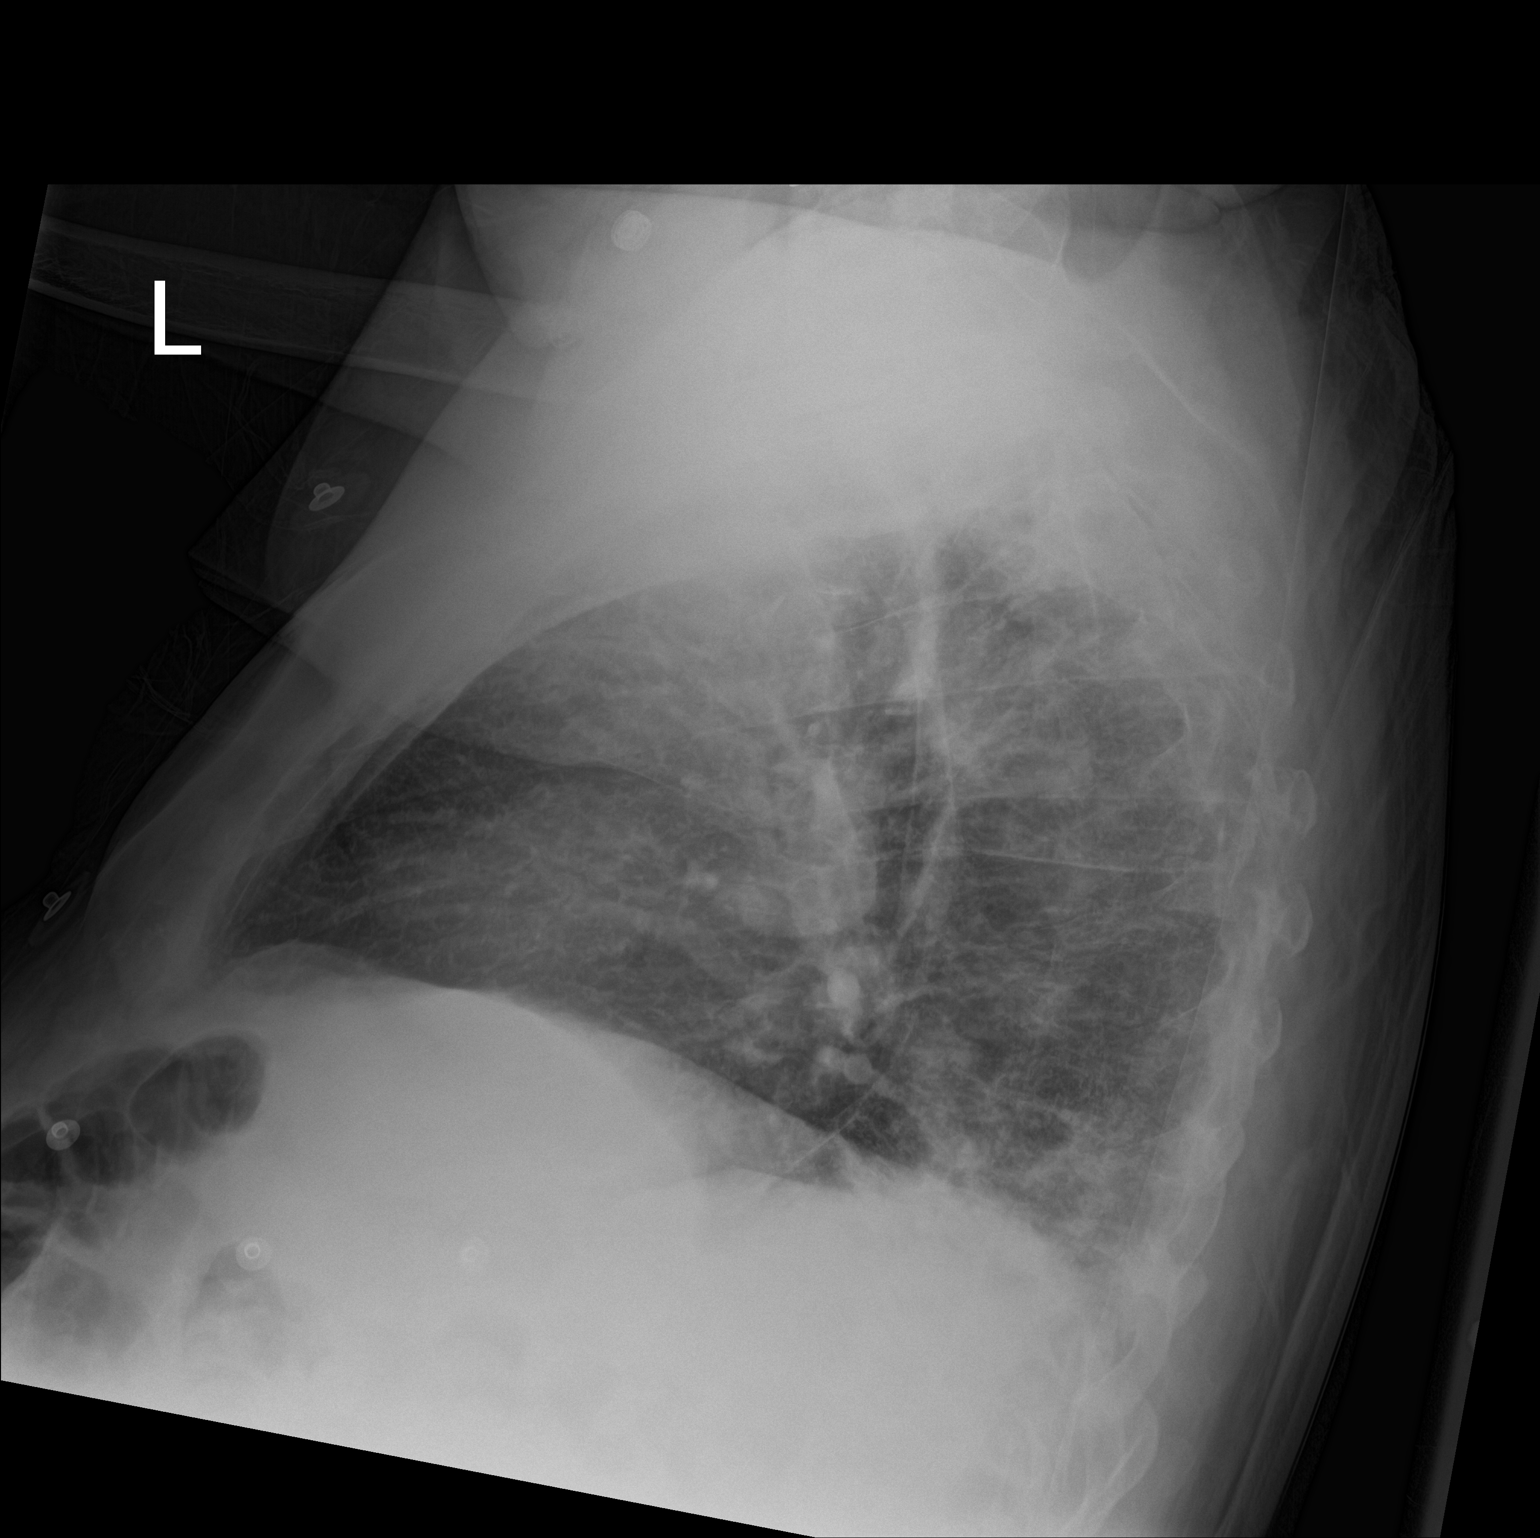

[chest ap]
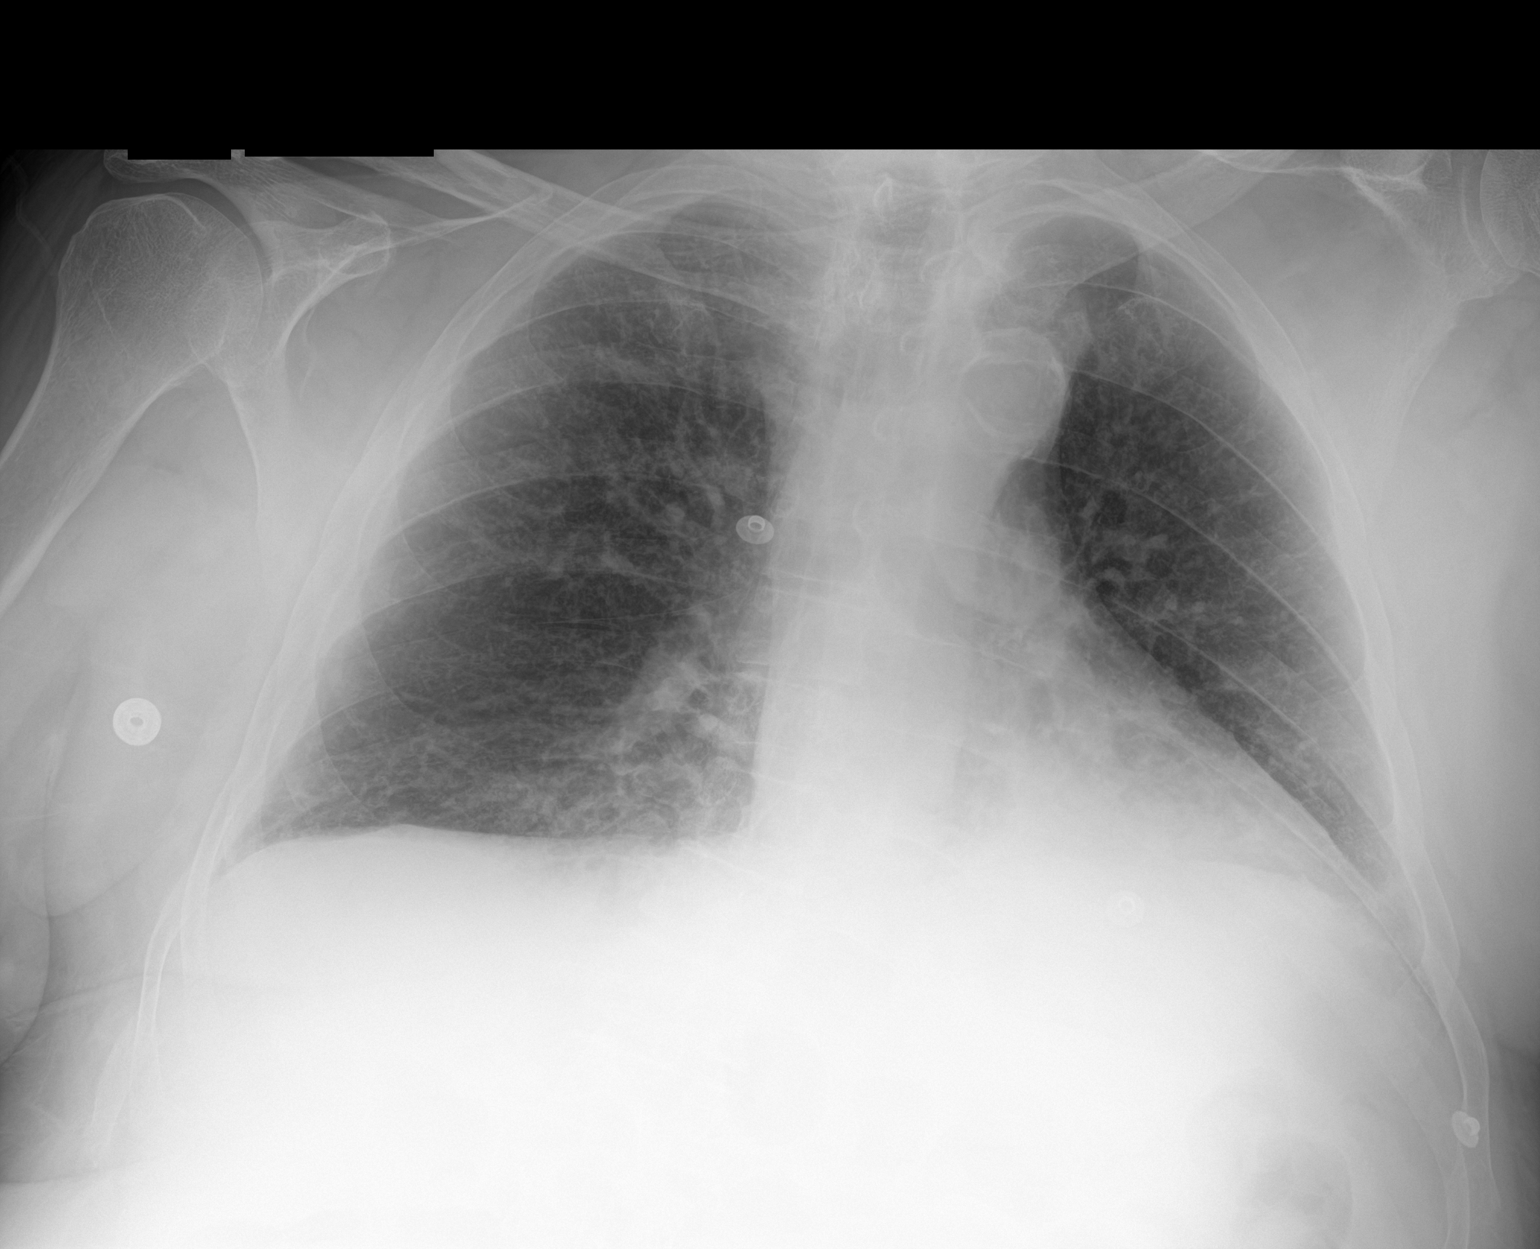

[2 of 2 positions shown; findings below may reference images not displayed]

FINDINGS: Cardiac shadow within normal limits. The lungs are well aerated
bilaterally. Left lower lobe infiltrate are noted. No acute bony
abnormality is seen.
IMPRESSION: Left lower lobe infiltrate.

## 2016-09-03 IMAGING — CT CT CHEST W/O CM
2 of 4 series · 15 of 36 positions shown, 18 images · non-contrast
Comparison: Chest x-ray 02/27/2016, chest CT 06/08/2005

CLINICAL DATA: Pneumonia.

EXAM:
CT CHEST WITHOUT CONTRAST
TECHNIQUE: Multidetector CT imaging of the chest was performed following the
standard protocol without IV contrast.

[Series 4: chest w/o 3mm st cor · coronal · non-contrast · 0.59mm/px · 3 of 101 slices shown]
[im 21/101  lung]
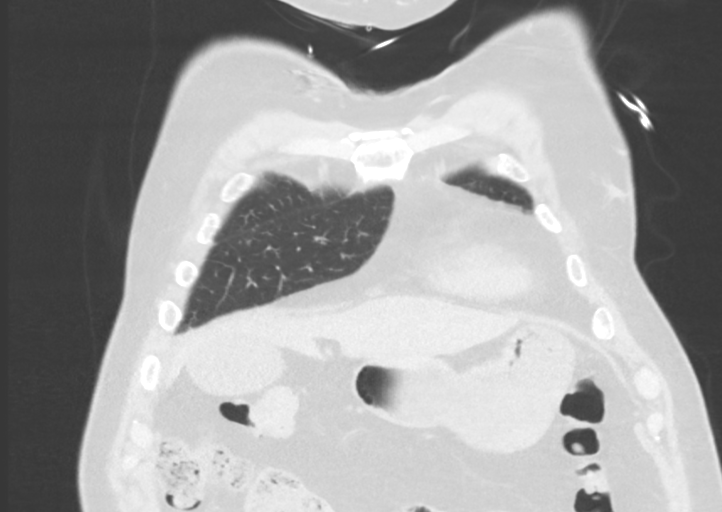
[im 41/101  lung]
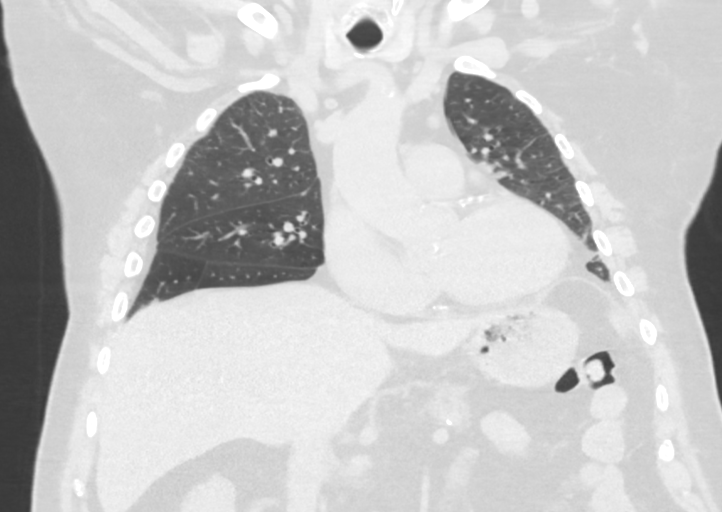
[im 61/101  lung]
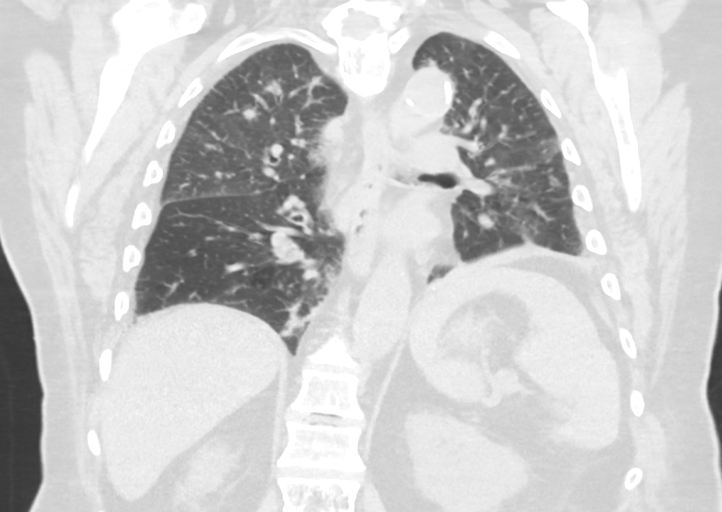

[Series 6: chest w/o 1mm st · axial · non-contrast · 0.86mm/px · z∈[+1152,+1428]mm · 12 of 379 slices shown, 15 images]
[im 17/379  mediastinal]
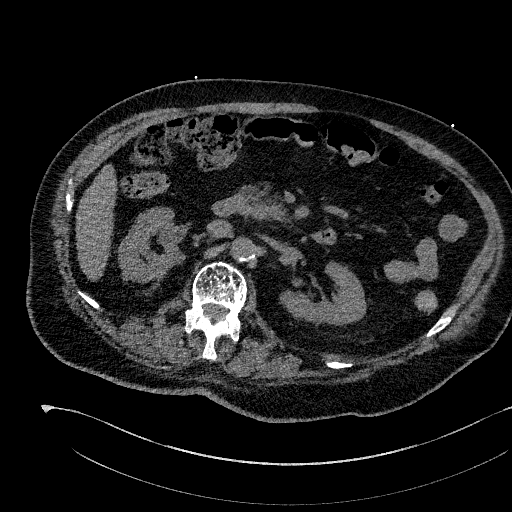
[im 17/379  lung]
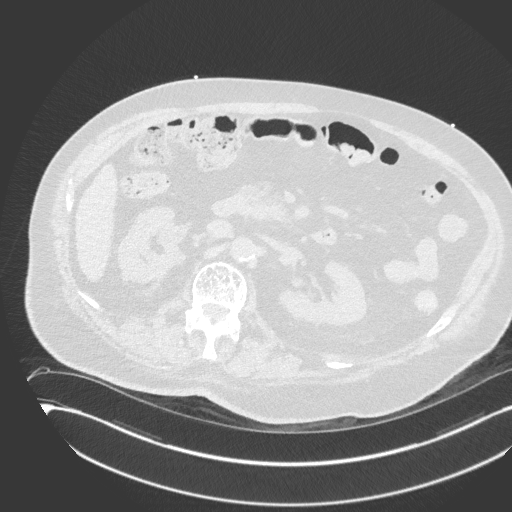
[im 50/379  lung]
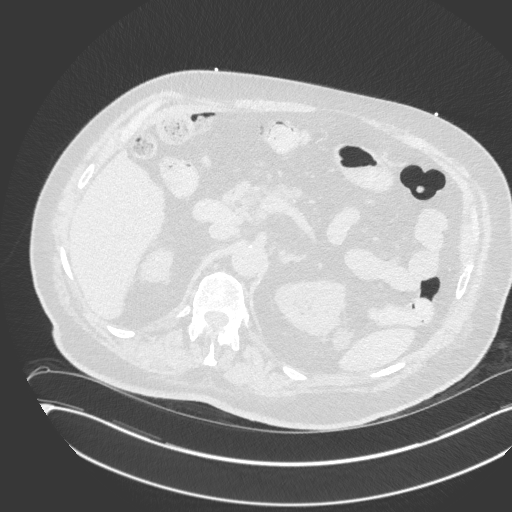
[im 83/379  lung]
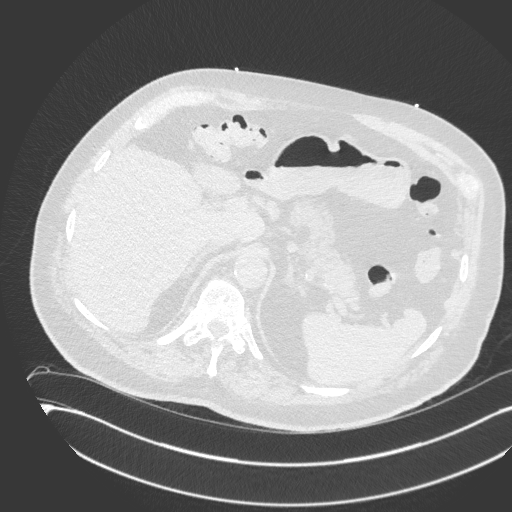
[im 116/379  lung]
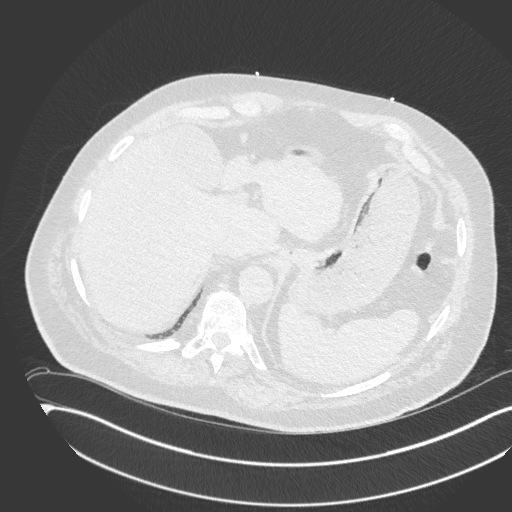
[im 148/379  mediastinal]
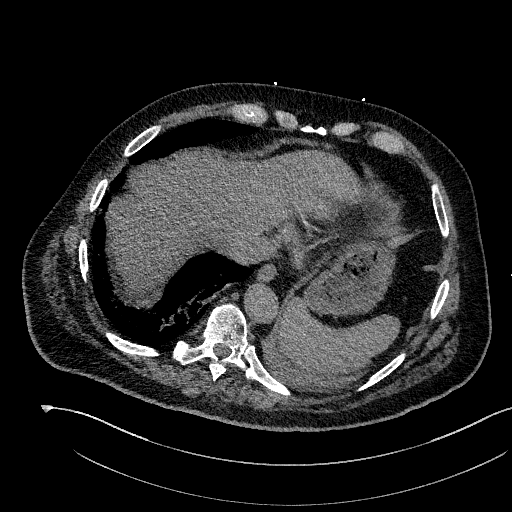
[im 148/379  lung]
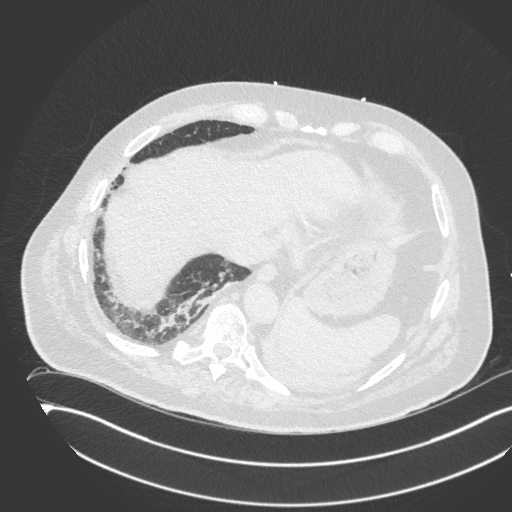
[im 181/379  lung]
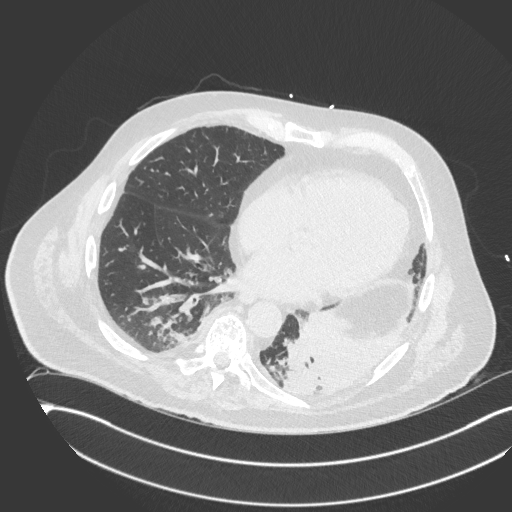
[im 198/379  lung]
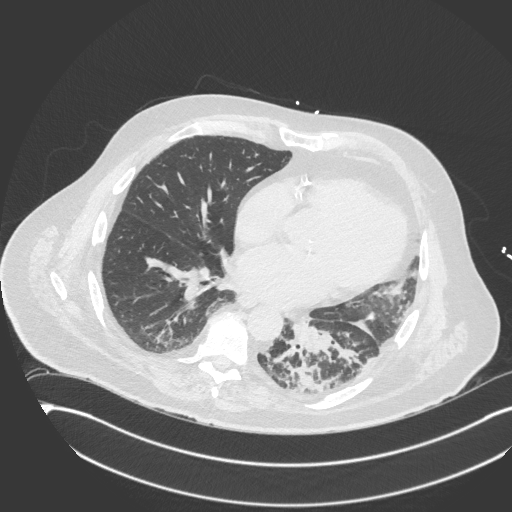
[im 231/379  lung]
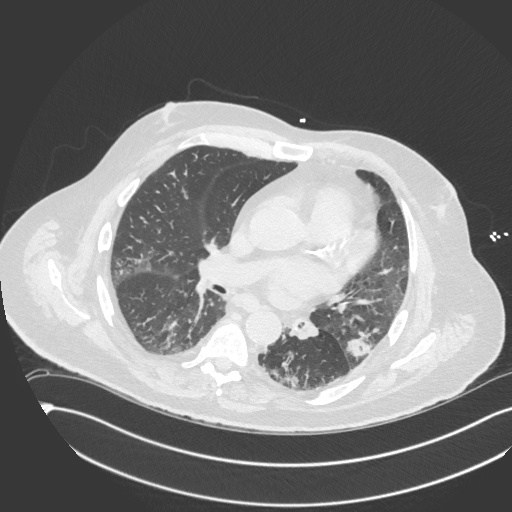
[im 263/379  mediastinal]
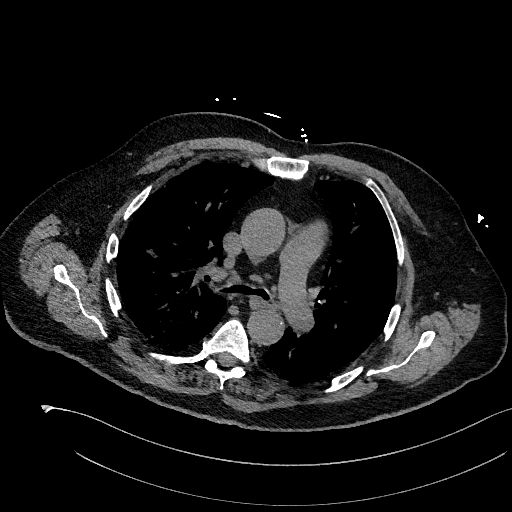
[im 263/379  lung]
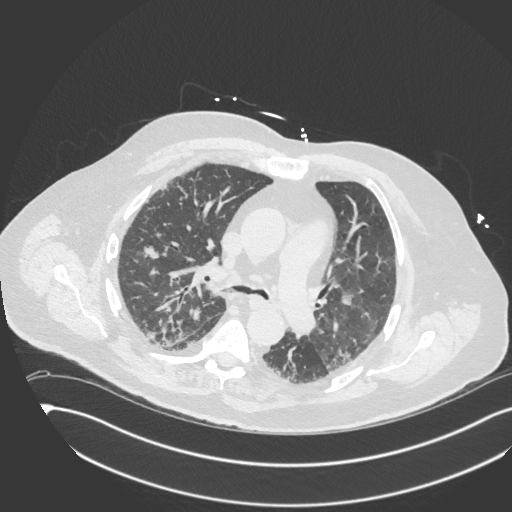
[im 296/379  lung]
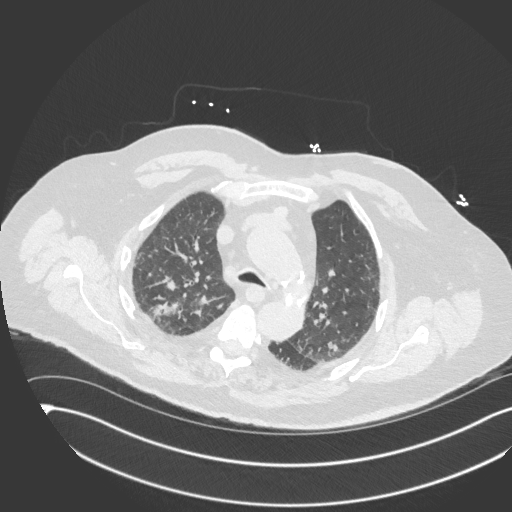
[im 329/379  lung]
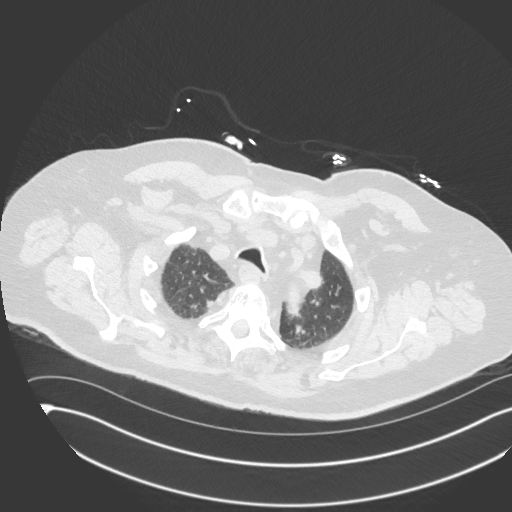
[im 362/379  lung]
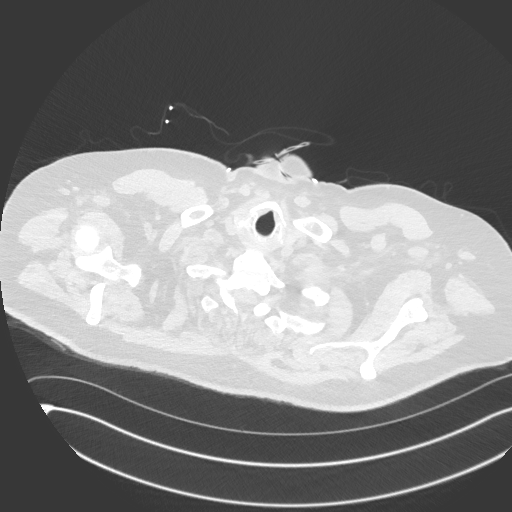

[15 of 36 positions shown; findings below may reference images not displayed]

FINDINGS: Heart: There is calcifications the coronary arteries. No pericardial
effusion. Heart size is normal.

Vascular structures: Tortuous great vessels. Thoracic aorta is not
aneurysmal.

Mediastinum/thyroid: The visualized portion of the thyroid gland has
a normal appearance. Numerous small mediastinal and hilar lymph
nodes are present. These do not reach criteria for pathologic
enlargement.

Lungs/Airways: There are multiple pulmonary opacities, more
confluent at the left lung base. Opacities are discrete and somewhat
rounded in appearance in the upper lobes bilaterally, making it
difficult to exclude superimposed metastatic disease as well as
infectious process.

Upper abdomen: The gallbladder is present. Left renal cyst is
present. There is colonic diverticulosis.

Chest wall/osseous structures: Multiple vertebral hemangiomata. No
acute osseous abnormality.
IMPRESSION: 1. Multifocal pulmonary opacities, consistent with infectious
process. Typical and atypical infections such as fungal infections
and tuberculosis could have this appearance.
2. Because of the discrete, rounded appearance of numerous densities
in the upper lobes, follow-up CT is recommended following clinical
improvement in 3-6 months to exclude metastatic disease.
3. Small mediastinal and hilar lymph nodes may be reactive.
4. Coronary artery disease.
5. Left renal cyst.
6. Colonic diverticulosis.

## 2016-09-03 IMAGING — DX DG CHEST 2V
2 series · 2 of 2 positions shown · non-contrast
Comparison: 02/26/2016

CLINICAL DATA: Shortness of Breath

EXAM:
CHEST  2 VIEW

[x chest ap]
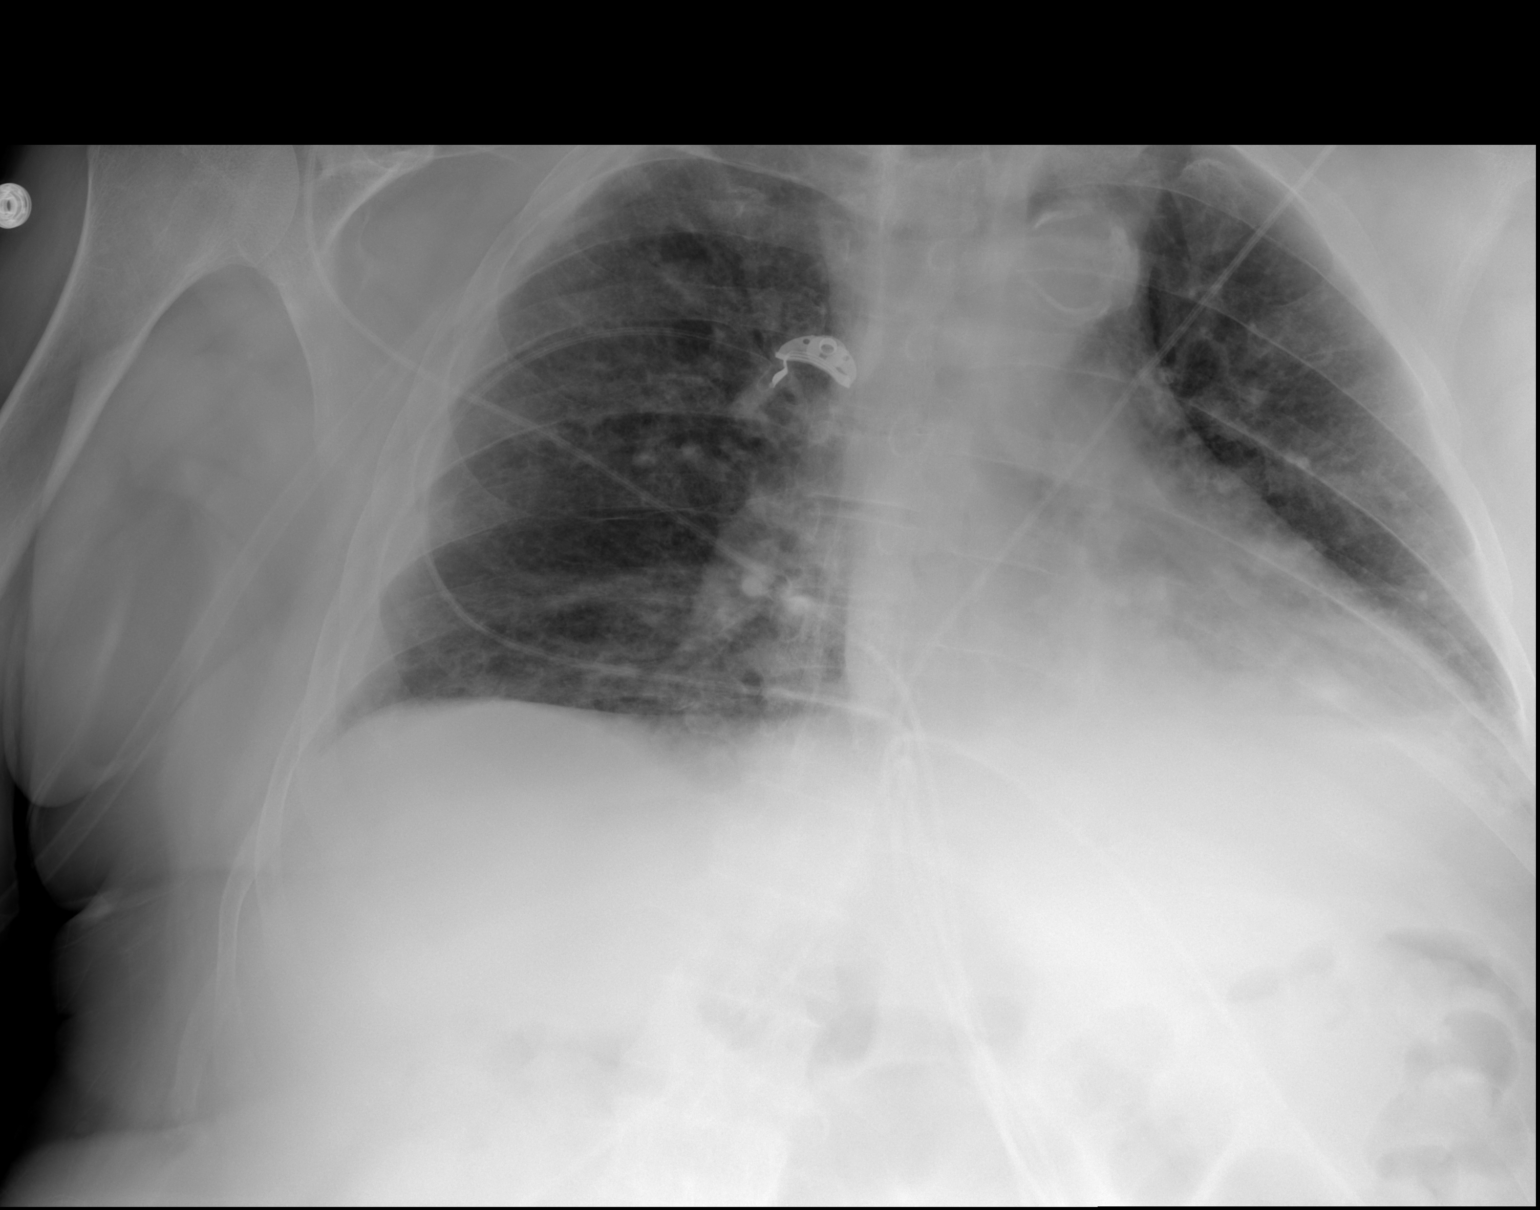

[w chest lat]
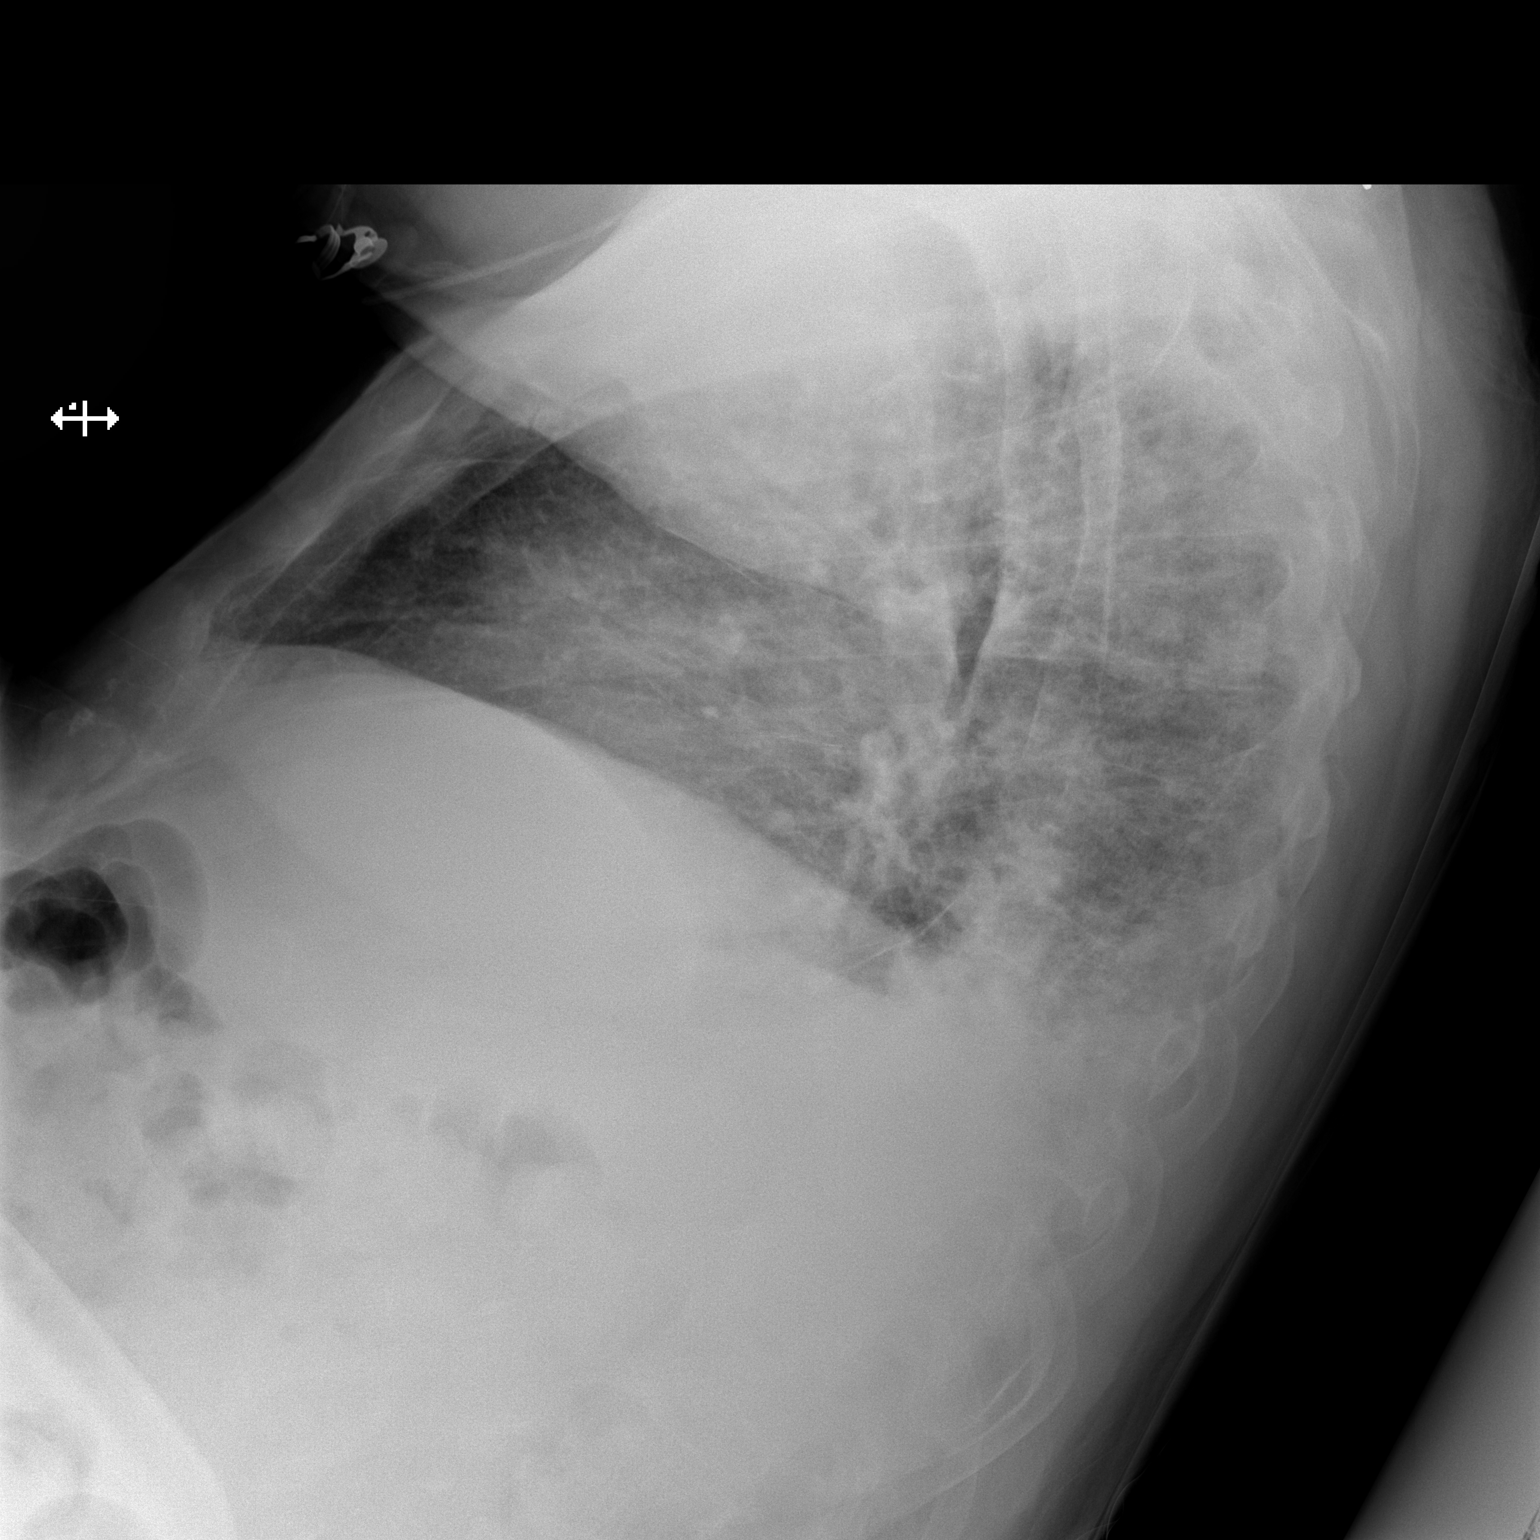

[2 of 2 positions shown; findings below may reference images not displayed]

FINDINGS: Cardiac shadow is stable. The lungs are well aerated bilaterally.
Mild left basilar infiltrate is again identified. The overall
inspiratory effort is poor with crowding of the vascular markings.
Aortic calcifications are again seen. No bony abnormality is noted.
IMPRESSION: Stable left lower lobe infiltrate.

## 2016-09-28 DIAGNOSIS — E1129 Type 2 diabetes mellitus with other diabetic kidney complication: Secondary | ICD-10-CM | POA: Diagnosis not present

## 2016-10-16 ENCOUNTER — Other Ambulatory Visit: Payer: Self-pay | Admitting: Cardiovascular Disease

## 2016-10-26 DIAGNOSIS — M859 Disorder of bone density and structure, unspecified: Secondary | ICD-10-CM | POA: Diagnosis not present

## 2016-10-26 DIAGNOSIS — E1129 Type 2 diabetes mellitus with other diabetic kidney complication: Secondary | ICD-10-CM | POA: Diagnosis not present

## 2016-10-26 DIAGNOSIS — E7800 Pure hypercholesterolemia, unspecified: Secondary | ICD-10-CM | POA: Diagnosis not present

## 2016-10-26 DIAGNOSIS — Z125 Encounter for screening for malignant neoplasm of prostate: Secondary | ICD-10-CM | POA: Diagnosis not present

## 2016-10-29 DIAGNOSIS — Z6824 Body mass index (BMI) 24.0-24.9, adult: Secondary | ICD-10-CM | POA: Diagnosis not present

## 2016-10-29 DIAGNOSIS — E1129 Type 2 diabetes mellitus with other diabetic kidney complication: Secondary | ICD-10-CM | POA: Diagnosis not present

## 2016-10-29 DIAGNOSIS — M6289 Other specified disorders of muscle: Secondary | ICD-10-CM | POA: Diagnosis not present

## 2016-10-29 DIAGNOSIS — Z0000 Encounter for general adult medical examination without abnormal findings: Secondary | ICD-10-CM | POA: Diagnosis not present

## 2016-10-29 DIAGNOSIS — N400 Enlarged prostate without lower urinary tract symptoms: Secondary | ICD-10-CM | POA: Diagnosis not present

## 2016-10-29 DIAGNOSIS — I130 Hypertensive heart and chronic kidney disease with heart failure and stage 1 through stage 4 chronic kidney disease, or unspecified chronic kidney disease: Secondary | ICD-10-CM | POA: Diagnosis not present

## 2016-10-29 DIAGNOSIS — E668 Other obesity: Secondary | ICD-10-CM | POA: Diagnosis not present

## 2016-10-29 DIAGNOSIS — I509 Heart failure, unspecified: Secondary | ICD-10-CM | POA: Diagnosis not present

## 2016-10-29 DIAGNOSIS — Z6834 Body mass index (BMI) 34.0-34.9, adult: Secondary | ICD-10-CM | POA: Diagnosis not present

## 2016-10-29 DIAGNOSIS — C61 Malignant neoplasm of prostate: Secondary | ICD-10-CM | POA: Diagnosis not present

## 2016-11-16 ENCOUNTER — Telehealth: Payer: Self-pay | Admitting: Cardiovascular Disease

## 2016-11-16 NOTE — Telephone Encounter (Signed)
I spoke with the pt's wife and made her aware that Dr Burt Knack will be back in the office later this week and we will complete handicap form at that time.  The pt's wife would like to pick up the form when complete and she can be contacted on mobile [phone].

## 2016-11-16 NOTE — Telephone Encounter (Signed)
New message    Wife calling handicap sticker has expired - receive a parking violation to pay bill wife went downtown to pay the bill was told to call the office and asked for renewal when she receive the renewal patient is to bring copy back downtown.

## 2017-01-01 ENCOUNTER — Emergency Department (HOSPITAL_COMMUNITY): Payer: Medicare Other

## 2017-01-01 ENCOUNTER — Inpatient Hospital Stay (HOSPITAL_COMMUNITY)
Admission: EM | Admit: 2017-01-01 | Discharge: 2017-01-04 | DRG: 065 | Disposition: A | Discharge status: Skilled Nursing Facility | Payer: Medicare Other | Attending: Internal Medicine | Admitting: Internal Medicine

## 2017-01-01 ENCOUNTER — Encounter (HOSPITAL_COMMUNITY): Payer: Self-pay | Admitting: Emergency Medicine

## 2017-01-01 DIAGNOSIS — I63421 Cerebral infarction due to embolism of right anterior cerebral artery: Principal | ICD-10-CM | POA: Diagnosis present

## 2017-01-01 DIAGNOSIS — E872 Acidosis, unspecified: Secondary | ICD-10-CM

## 2017-01-01 DIAGNOSIS — Z833 Family history of diabetes mellitus: Secondary | ICD-10-CM

## 2017-01-01 DIAGNOSIS — E669 Obesity, unspecified: Secondary | ICD-10-CM | POA: Diagnosis not present

## 2017-01-01 DIAGNOSIS — R296 Repeated falls: Secondary | ICD-10-CM

## 2017-01-01 DIAGNOSIS — I482 Chronic atrial fibrillation: Secondary | ICD-10-CM | POA: Diagnosis present

## 2017-01-01 DIAGNOSIS — E1122 Type 2 diabetes mellitus with diabetic chronic kidney disease: Secondary | ICD-10-CM | POA: Diagnosis not present

## 2017-01-01 DIAGNOSIS — I639 Cerebral infarction, unspecified: Secondary | ICD-10-CM

## 2017-01-01 DIAGNOSIS — Z881 Allergy status to other antibiotic agents status: Secondary | ICD-10-CM

## 2017-01-01 DIAGNOSIS — R55 Syncope and collapse: Secondary | ICD-10-CM | POA: Diagnosis not present

## 2017-01-01 DIAGNOSIS — R531 Weakness: Secondary | ICD-10-CM

## 2017-01-01 DIAGNOSIS — Z9181 History of falling: Secondary | ICD-10-CM

## 2017-01-01 DIAGNOSIS — E785 Hyperlipidemia, unspecified: Secondary | ICD-10-CM | POA: Diagnosis not present

## 2017-01-01 DIAGNOSIS — I6340 Cerebral infarction due to embolism of unspecified cerebral artery: Secondary | ICD-10-CM | POA: Insufficient documentation

## 2017-01-01 DIAGNOSIS — Z9079 Acquired absence of other genital organ(s): Secondary | ICD-10-CM

## 2017-01-01 DIAGNOSIS — I5032 Chronic diastolic (congestive) heart failure: Secondary | ICD-10-CM | POA: Diagnosis not present

## 2017-01-01 DIAGNOSIS — N183 Chronic kidney disease, stage 3 (moderate): Secondary | ICD-10-CM | POA: Diagnosis present

## 2017-01-01 DIAGNOSIS — I2510 Atherosclerotic heart disease of native coronary artery without angina pectoris: Secondary | ICD-10-CM | POA: Diagnosis present

## 2017-01-01 DIAGNOSIS — E1151 Type 2 diabetes mellitus with diabetic peripheral angiopathy without gangrene: Secondary | ICD-10-CM | POA: Diagnosis not present

## 2017-01-01 DIAGNOSIS — Z87891 Personal history of nicotine dependence: Secondary | ICD-10-CM

## 2017-01-01 DIAGNOSIS — I130 Hypertensive heart and chronic kidney disease with heart failure and stage 1 through stage 4 chronic kidney disease, or unspecified chronic kidney disease: Secondary | ICD-10-CM | POA: Diagnosis not present

## 2017-01-01 DIAGNOSIS — S299XXA Unspecified injury of thorax, initial encounter: Secondary | ICD-10-CM | POA: Diagnosis not present

## 2017-01-01 DIAGNOSIS — I10 Essential (primary) hypertension: Secondary | ICD-10-CM | POA: Diagnosis present

## 2017-01-01 DIAGNOSIS — J449 Chronic obstructive pulmonary disease, unspecified: Secondary | ICD-10-CM | POA: Diagnosis present

## 2017-01-01 DIAGNOSIS — Z8249 Family history of ischemic heart disease and other diseases of the circulatory system: Secondary | ICD-10-CM

## 2017-01-01 DIAGNOSIS — F0390 Unspecified dementia without behavioral disturbance: Secondary | ICD-10-CM

## 2017-01-01 DIAGNOSIS — G4733 Obstructive sleep apnea (adult) (pediatric): Secondary | ICD-10-CM | POA: Diagnosis present

## 2017-01-01 DIAGNOSIS — R339 Retention of urine, unspecified: Secondary | ICD-10-CM | POA: Diagnosis present

## 2017-01-01 DIAGNOSIS — R259 Unspecified abnormal involuntary movements: Secondary | ICD-10-CM | POA: Diagnosis not present

## 2017-01-01 DIAGNOSIS — Z79899 Other long term (current) drug therapy: Secondary | ICD-10-CM

## 2017-01-01 DIAGNOSIS — W19XXXA Unspecified fall, initial encounter: Secondary | ICD-10-CM

## 2017-01-01 DIAGNOSIS — R410 Disorientation, unspecified: Secondary | ICD-10-CM | POA: Diagnosis not present

## 2017-01-01 DIAGNOSIS — Z9119 Patient's noncompliance with other medical treatment and regimen: Secondary | ICD-10-CM

## 2017-01-01 DIAGNOSIS — J4489 Other specified chronic obstructive pulmonary disease: Secondary | ICD-10-CM | POA: Diagnosis present

## 2017-01-01 DIAGNOSIS — I63521 Cerebral infarction due to unspecified occlusion or stenosis of right anterior cerebral artery: Secondary | ICD-10-CM | POA: Diagnosis not present

## 2017-01-01 DIAGNOSIS — E860 Dehydration: Secondary | ICD-10-CM | POA: Diagnosis present

## 2017-01-01 DIAGNOSIS — E119 Type 2 diabetes mellitus without complications: Secondary | ICD-10-CM

## 2017-01-01 DIAGNOSIS — W06XXXA Fall from bed, initial encounter: Secondary | ICD-10-CM | POA: Diagnosis present

## 2017-01-01 DIAGNOSIS — K219 Gastro-esophageal reflux disease without esophagitis: Secondary | ICD-10-CM | POA: Diagnosis present

## 2017-01-01 DIAGNOSIS — Z6832 Body mass index (BMI) 32.0-32.9, adult: Secondary | ICD-10-CM

## 2017-01-01 DIAGNOSIS — Z7982 Long term (current) use of aspirin: Secondary | ICD-10-CM

## 2017-01-01 DIAGNOSIS — N400 Enlarged prostate without lower urinary tract symptoms: Secondary | ICD-10-CM | POA: Diagnosis present

## 2017-01-01 HISTORY — DX: Weakness: R53.1

## 2017-01-01 HISTORY — DX: Repeated falls: R29.6

## 2017-01-01 LAB — CBC WITH DIFFERENTIAL/PLATELET
BASOS ABS: 0 10*3/uL (ref 0.0–0.1)
BASOS PCT: 0 %
EOS ABS: 0 10*3/uL (ref 0.0–0.7)
Eosinophils Relative: 0 %
HCT: 45.5 % (ref 39.0–52.0)
HEMOGLOBIN: 16 g/dL (ref 13.0–17.0)
LYMPHS ABS: 0.8 10*3/uL (ref 0.7–4.0)
Lymphocytes Relative: 5 %
MCH: 32.6 pg (ref 26.0–34.0)
MCHC: 35.2 g/dL (ref 30.0–36.0)
MCV: 92.7 fL (ref 78.0–100.0)
Monocytes Absolute: 1.2 10*3/uL — ABNORMAL HIGH (ref 0.1–1.0)
Monocytes Relative: 7 %
NEUTROS PCT: 88 %
Neutro Abs: 15.2 10*3/uL — ABNORMAL HIGH (ref 1.7–7.7)
Platelets: 179 10*3/uL (ref 150–400)
RBC: 4.91 MIL/uL (ref 4.22–5.81)
RDW: 13.3 % (ref 11.5–15.5)
WBC: 17.2 10*3/uL — AB (ref 4.0–10.5)

## 2017-01-01 LAB — I-STAT TROPONIN, ED
TROPONIN I, POC: 0 ng/mL (ref 0.00–0.08)
Troponin i, poc: 0 ng/mL (ref 0.00–0.08)

## 2017-01-01 LAB — LACTIC ACID, PLASMA
LACTIC ACID, VENOUS: 1.3 mmol/L (ref 0.5–1.9)
Lactic Acid, Venous: 1.1 mmol/L (ref 0.5–1.9)

## 2017-01-01 LAB — COMPREHENSIVE METABOLIC PANEL
ALBUMIN: 4.1 g/dL (ref 3.5–5.0)
ALK PHOS: 82 U/L (ref 38–126)
ALT: 19 U/L (ref 17–63)
ALT: 16 U/L — AB (ref 17–63)
AST: 26 U/L (ref 15–41)
AST: 22 U/L (ref 15–41)
Albumin: 3.6 g/dL (ref 3.5–5.0)
Alkaline Phosphatase: 76 U/L (ref 38–126)
Anion gap: 16 — ABNORMAL HIGH (ref 5–15)
Anion gap: 10 (ref 5–15)
BUN: 25 mg/dL — AB (ref 6–20)
BUN: 21 mg/dL — ABNORMAL HIGH (ref 6–20)
CALCIUM: 9.8 mg/dL (ref 8.9–10.3)
CHLORIDE: 101 mmol/L (ref 101–111)
CHLORIDE: 104 mmol/L (ref 101–111)
CO2: 20 mmol/L — AB (ref 22–32)
CO2: 23 mmol/L (ref 22–32)
CREATININE: 1.82 mg/dL — AB (ref 0.61–1.24)
Calcium: 9.2 mg/dL (ref 8.9–10.3)
Creatinine, Ser: 1.66 mg/dL — ABNORMAL HIGH (ref 0.61–1.24)
GFR calc Af Amer: 37 mL/min — ABNORMAL LOW (ref >60)
GFR calc non Af Amer: 32 mL/min — ABNORMAL LOW (ref >60)
GFR, EST AFRICAN AMERICAN: 41 mL/min — AB (ref >60)
GFR, EST NON AFRICAN AMERICAN: 35 mL/min — AB (ref >60)
GLUCOSE: 172 mg/dL — AB (ref 65–99)
Glucose, Bld: 117 mg/dL — ABNORMAL HIGH (ref 65–99)
POTASSIUM: 3.6 mmol/L (ref 3.5–5.1)
Potassium: 4.1 mmol/L (ref 3.5–5.1)
SODIUM: 137 mmol/L (ref 135–145)
SODIUM: 137 mmol/L (ref 135–145)
Total Bilirubin: 1 mg/dL (ref 0.3–1.2)
Total Bilirubin: 1.1 mg/dL (ref 0.3–1.2)
Total Protein: 7.9 g/dL (ref 6.5–8.1)
Total Protein: 6.8 g/dL (ref 6.5–8.1)

## 2017-01-01 LAB — TSH
TSH: 2.893 u[IU]/mL (ref 0.350–4.500)
TSH: 1.98 u[IU]/mL (ref 0.350–4.500)

## 2017-01-01 LAB — URINALYSIS, ROUTINE W REFLEX MICROSCOPIC
BILIRUBIN URINE: NEGATIVE
GLUCOSE, UA: NEGATIVE mg/dL
Hgb urine dipstick: NEGATIVE
KETONES UR: NEGATIVE mg/dL
Leukocytes, UA: NEGATIVE
Nitrite: NEGATIVE
PH: 5 (ref 5.0–8.0)
Protein, ur: NEGATIVE mg/dL
SPECIFIC GRAVITY, URINE: 1.009 (ref 1.005–1.030)

## 2017-01-01 LAB — MAGNESIUM
MAGNESIUM: 2.1 mg/dL (ref 1.7–2.4)
MAGNESIUM: 2 mg/dL (ref 1.7–2.4)

## 2017-01-01 LAB — GLUCOSE, CAPILLARY
Glucose-Capillary: 123 mg/dL — ABNORMAL HIGH (ref 65–99)
Glucose-Capillary: 111 mg/dL — ABNORMAL HIGH (ref 65–99)

## 2017-01-01 LAB — I-STAT CG4 LACTIC ACID, ED: LACTIC ACID, VENOUS: 2.87 mmol/L — AB (ref 0.5–1.9)

## 2017-01-01 LAB — VITAMIN B12: VITAMIN B 12: 624 pg/mL (ref 180–914)

## 2017-01-01 LAB — TROPONIN I

## 2017-01-01 MED ORDER — OXYBUTYNIN CHLORIDE ER 5 MG PO TB24
5.0000 mg | ORAL_TABLET | Freq: Every day | ORAL | Status: DC
  Administered 2017-01-02 – 2017-01-04 (×3): 5 mg via ORAL
  Filled 2017-01-01 (×3): qty 1

## 2017-01-01 MED ORDER — METOPROLOL SUCCINATE ER 50 MG PO TB24
50.0000 mg | ORAL_TABLET | Freq: Every day | ORAL | Status: DC
  Administered 2017-01-01 – 2017-01-03 (×3): 50 mg via ORAL
  Filled 2017-01-01 (×3): qty 1

## 2017-01-01 MED ORDER — ASPIRIN EC 325 MG PO TBEC: 325.0000 mg | DELAYED_RELEASE_TABLET | Freq: Every day | ORAL | Status: DC

## 2017-01-01 MED ORDER — FINASTERIDE 5 MG PO TABS
5.0000 mg | ORAL_TABLET | Freq: Every day | ORAL | Status: DC
  Administered 2017-01-02 – 2017-01-04 (×3): 5 mg via ORAL
  Filled 2017-01-01 (×3): qty 1

## 2017-01-01 MED ORDER — DONEPEZIL HCL 5 MG PO TABS
5.0000 mg | ORAL_TABLET | Freq: Every day | ORAL | Status: DC
  Administered 2017-01-02 – 2017-01-04 (×3): 5 mg via ORAL
  Filled 2017-01-01 (×3): qty 1

## 2017-01-01 MED ORDER — ACETAMINOPHEN 325 MG PO TABS: 650.0000 mg | ORAL_TABLET | Freq: Four times a day (QID) | ORAL | Status: DC | PRN

## 2017-01-01 MED ORDER — DOCUSATE SODIUM 100 MG PO CAPS
100.0000 mg | ORAL_CAPSULE | Freq: Two times a day (BID) | ORAL | Status: DC
  Administered 2017-01-01 – 2017-01-04 (×6): 100 mg via ORAL
  Filled 2017-01-01 (×6): qty 1

## 2017-01-01 MED ORDER — TRAMADOL HCL 50 MG PO TABS: 50.0000 mg | ORAL_TABLET | Freq: Three times a day (TID) | ORAL | Status: DC | PRN

## 2017-01-01 MED ORDER — SODIUM CHLORIDE 0.9 % IV BOLUS (SEPSIS)
500.0000 mL | Freq: Once | INTRAVENOUS | Status: AC
Start: 2017-01-01 — End: 2017-01-01
  Administered 2017-01-01: 500 mL via INTRAVENOUS

## 2017-01-01 MED ORDER — INSULIN ASPART 100 UNIT/ML ~~LOC~~ SOLN
0.0000 [IU] | Freq: Three times a day (TID) | SUBCUTANEOUS | Status: DC
  Administered 2017-01-01 – 2017-01-04 (×2): 1 [IU] via SUBCUTANEOUS
  Administered 2017-01-04: 2 [IU] via SUBCUTANEOUS

## 2017-01-01 MED ORDER — ACETAMINOPHEN 650 MG RE SUPP: 650.0000 mg | Freq: Four times a day (QID) | RECTAL | Status: DC | PRN

## 2017-01-01 MED ORDER — SODIUM CHLORIDE 0.9 % IV SOLN
INTRAVENOUS | Status: AC
  Administered 2017-01-01 – 2017-01-02 (×2): via INTRAVENOUS

## 2017-01-01 NOTE — H&P (Signed)
History and Physical    [name] [name] [id number] DOB: [birth year] DOA: 01/01/2017  PCP: Haywood Pao, MD  Patient coming from: Home  Chief Complaint: increased frequency of falls, weakness and confusion  HPI: [name] [name] is a 83 y.o. male with medical history significant of chronic atrial fibrillation, not on any anticoagulation therapy due to frequent falls, diastolic CHF with EF 0000000 by last echo in February 2017, dementia, hypertension hyperlipidemia, CKD, COPD, diabetes mellitus who presented to the emergency department with complaints of a falls increasing in frequency, confusion and weakness. He has a history of falls and has been falling usually once or twice a month, but last night he felt three times. HIs son and wife assisted the patient to get off the floor and after the third fall his wife called the non-emergency medical service for assistance and they recommended bringing him to the ED for evaluation He denied CP, SOB, dizziness, lightheadedness, palpitations and repeatedly said that he is clumsy, weak and at times is confused with memory gaps. Wife reports he is possibly slightly more confused than at baseline than usual; reports he takes medication for dementia. He also c/o urinary incontinence. This morning patient was unable to stand and ambulate few feet using walker d/t progressive weakness.   ED Course: VS on presentation were stable, Head CT negative  for acute findings, chest XRay did not show any acute cardiopulmonary disease.    EKG showed rate controlled AFib without acute ST-TW changes Blood work elevated white blood cells count of 17,200, elevated BUN - 20 and creatinine - 1.82 (the baseline creatinine is 1.54), lactic acid - 2.87, patient was given two boluses of NS 500 cc in the ED and repeat Lactic acid is pending.  Urinalysis was negative for signs of infection.  Review of Systems: As per HPI otherwise 10 point review of systems negative.    Ambulatory Status: ambulates minimally with walker  Past Medical History:  Diagnosis Date  . Arthritis   . Chronic atrial fibrillation ([place])   . Diabetes mellitus without complication ([place])   . Hyperlipidemia   . Hypertension   . Sleep apnea    no cpap used, sleep study was several yrs ago  . Urinary retention foley last changed 3 weeks ago    Past Surgical History:  Procedure Laterality Date  . BACK SURGERY  yrs ago   lower back surgery  . PROSTATE SURGERY  yrs ago, no cancer  . TONSILLECTOMY  as child  . TRANSURETHRAL RESECTION OF PROSTATE  02/22/2012   Procedure: TRANSURETHRAL RESECTION OF THE PROSTATE WITH GYRUS INSTRUMENTS;  Surgeon: Molli Hazard, MD;  Location: [facility];  Service: Urology;  Laterality: N/A;        Social History   Social History  . Marital status: Married    Spouse name: N/A  . Number of children: N/A  . Years of education: N/A   Occupational History  . Not on file.   Social History Main Topics  . Smoking status: Former Smoker    Packs/day: 0.50    Years: 50.00    Types: Cigarettes    Quit date: 12/21/1980  . Smokeless tobacco: Never Used  . Alcohol use 0.0 oz/week     Comment: rare use  . Drug use: No  . Sexual activity: No   Other Topics Concern  . Not on file   Social History Narrative  . No narrative on file    Allergies  Allergen Reactions  .  Sulfa Antibiotics Swelling    hands    Family History  Problem Relation Age of Onset  . Heart attack Father     Prior to Admission medications   Medication Sig Start Date End Date Taking? Authorizing Provider  aspirin EC 325 MG tablet Take 1 tablet (325 mg total) by mouth daily. 10/16/15   Sherren Mocha, MD  donepezil (ARICEPT) 5 MG tablet Take 5 mg by mouth daily. 01/09/16   Historical Provider, MD  finasteride (PROSCAR) 5 MG tablet Take 5 mg by mouth daily.    Historical Provider, MD  furosemide (LASIX) 20 MG tablet Take 1 tablet (20 mg total) by mouth daily. 05/25/16    Sherren Mocha, MD  guaiFENesin (MUCINEX) 600 MG 12 hr tablet Take 1,200 mg by mouth every 12 (twelve) hours. Reported on 04/09/2016    Historical Provider, MD  metoprolol (LOPRESSOR) 50 MG tablet Take 50 mg by mouth at bedtime.    Historical Provider, MD  metoprolol succinate (TOPROL-XL) 50 MG 24 hr tablet TAKE 1 TABLET (50 MG TOTAL) BY MOUTH AT BEDTIME. 10/16/16   Sherren Mocha, MD  oxybutynin (DITROPAN-XL) 5 MG 24 hr tablet Take 5 mg by mouth daily. 04/16/16   Historical Provider, MD    Physical Exam: Vitals:   01/01/17 1045 01/01/17 1100 01/01/17 1104 01/01/17 1130  BP: 126/74  133/83 123/91  Pulse: 95 95 87 99  Resp:   16 25  Temp:   97.6 F (36.4 C)   TempSrc:   Rectal   SpO2: 97% 95% 96% 96%  Weight:      Height:         General: Appears calm and comfortable Eyes: PERRLA, EOMI, normal lids, iris ENT:  grossly normal hearing, lips & tongue, mucous membranes moist and intact Neck: no lymphoadenopathy, masses or thyromegaly Cardiovascular: irregularly irregular no m/r/g. No JVD, carotid bruits. No LE edema.  Respiratory: bilateral no wheezes, rales, rhonchi or cracles. Normal respiratory effort. No accessory muscle use observed Abdomen: soft, non-tender, non-distended, no organomegaly or masses appreciated. BS present in all quadrants Skin: no rash, ulcers or induration seen on limited exam Musculoskeletal: grossly normal tone BUE/BLE, good ROM, no bony abnormality or joint deformities observed Psychiatric: grossly normal mood and affect, speech fluent and appropriate, alert and oriented x3 Neurologic: CN II-XII grossly intact, moves all extremities in coordinated fashion, sensation intact  Labs on Admission: I have personally reviewed following labs and imaging studies  CBC, BMP  GFR: Estimated Creatinine Clearance: 30.7 mL/min (by C-G formula based on SCr of 1.82 mg/dL (H)).   Creatinine Clearance: Estimated Creatinine Clearance: 30.7 mL/min (by C-G formula based on  SCr of 1.82 mg/dL (H)).    Radiological Exams on Admission: Dg Chest 2 View  Result Date: 01/01/2017 CLINICAL DATA:  Fall EXAM: CHEST  2 VIEW COMPARISON:  03/12/2016 FINDINGS: Normal heart size. Lungs are under aerated and grossly clear. Interstitial prominence is stable. No pneumothorax or pleural effusion. IMPRESSION: No active cardiopulmonary disease. Electronically Signed   By: Marybelle Killings M.D.   On: 01/01/2017 09:07   Ct Head Wo Contrast  Result Date: 01/01/2017 CLINICAL DATA:  Multiple falls out of bed this morning. EXAM: CT HEAD WITHOUT CONTRAST TECHNIQUE: Contiguous axial images were obtained from the base of the skull through the vertex without intravenous contrast. COMPARISON:  01/28/2016 FINDINGS: Brain: There is diffuse moderately severe cerebral cortical atrophy and cerebellar atrophy with secondary ventricular dilatation and extensive periventricular white matter disease. The findings are stable. There is  no acute hemorrhage, acute infarction, or intracranial mass lesion. Vascular: No hyperdense vessel or unexpected calcification. Skull: Normal. Negative for fracture or focal lesion. Sinuses/Orbits: Normal. Other: None. IMPRESSION: 1. No acute intracranial abnormality. 2. Chronic atrophy with chronic small vessel ischemic changes. Electronically Signed   By: Lorriane Shire M.D.   On: 01/01/2017 09:25    EKG: Independently reviewed - Afib without acute ST-TW changes and poor R wave progression  Assessment/Plan Principal Problem:   Frequent falls Active Problems:   Hyperlipidemia   HYPERTENSION, BENIGN   GERD   Diabetes mellitus without complication (HCC)   COPD (chronic obstructive pulmonary disease) with chronic bronchitis (HCC)   Generalized weakness    Falls increasing in frequency.  Etiology unclear and could be multifactorial. Given his elevated white blood cells count and lactic acid, infectious etiology could not be excluded completely Minute or for fevers, follow  white blood cells count, recheck lactic acid after hydration received in the emergency department Also, could be associated with sudden orthostatic drop of blood pressure, hypoglycemia, cardiac arrhythmia Continue telemetry monitoring, cycle troponin (first set was negative at 0.00) Check orthostatic vital signs Monitor CBGs PT evaluation will be requested as well as case management consultation in case patient requires placement for rehabilitation  Generalized weakness Will check B12,  Folate, Magnesium, TSH Could be associate with progressive general;ized decline  Confusion, worsening - could be associated with metabolic derangement, underlying infection and or progressoin of dementia   Hypertension - currently stable Continue home medication and adjust the doses if needed depending on the BP readings  Dementia  Continue current medications and provide supportive care  Diabetes mellitus - patient is not on any medications at home Continue carb modified diet, check hemoglobin A1c, add sliding scale insulin  DVT prophylaxis: SCD given frequent falls Code Status: Full Family Communication: wife Disposition Plan: telemetry Consults called: none Admission status: observation   York Grice, [place] Pager: [phone] [facility]  If 7PM-7AM, please contact night-coverage [email or web address] Password [email or web address]  01/01/2017, 2:10 PM

## 2017-01-01 NOTE — ED Notes (Signed)
Pt in and out cath for clear yellow urine, pt tolerated well only yelled x 1

## 2017-01-01 NOTE — ED Notes (Signed)
Attempted to ambulate pt with walker, pt did not make it out of bed.

## 2017-01-01 NOTE — ED Provider Notes (Signed)
[facility] DEPT Provider Note   CSN: [id number] Arrival date & time: 01/01/17  0757     History   Chief Complaint Chief Complaint  Patient presents with  . Fall  . Weakness    HPI [name] [name] is a 83 y.o. male with PMH dementia, Chronic Afib not on anticoagulation, HLD, HLD, COPD, CKD3, hx of BPH and urinary retention   HPI Patient and wife provided history. Wife reports he 3 times since midnight (which is more frequent than usual). He does use a walker and patient reports he was using the walker. Patient reports of falling when trying get out of bed and possibly use the rest room. Wife is unsure about what exactly happened as she was not in the room with him. Wife and her son were able to help him up the first two times but the third time she had to call the non-emergent number; EMS came and suggested going to the ED for further evaluation.  He denies chest pain,  lightheadedness, dizziness, palpitations prior to the episodes. He reports he just felt "clumsy" which is how he normally feels. He fell on his right side but did not hit his head; he denies loss of consciousness. He currently does not have pain anywhere. Patient reports he has had a cold recently and had a cough for the past three weeks; wife reports this is his baseline cough. No fevers at home. Has been having normal PO intake. He also reports of dysuria for the past few months. Wife reports he is possibly slightly more confused than usual; reports he takes medication for dementia. He is not on a blood thinner per wife for his afib.   Past Medical History:  Diagnosis Date  . Arthritis   . Chronic atrial fibrillation ([place])   . Diabetes mellitus without complication ([place])   . Hyperlipidemia   . Hypertension   . Sleep apnea    no cpap used, sleep study was several yrs ago  . Urinary retention foley last changed 3 weeks ago    Patient Active Problem List   Diagnosis Date Noted  . Acute hypoxemic respiratory  failure ([place]) 04/09/2016  . COPD (chronic obstructive pulmonary disease) with chronic bronchitis ([place]) 03/12/2016  . Diabetes mellitus type 2, noninsulin dependent ([place])   . HCAP (healthcare-associated pneumonia) 02/26/2016  . Elevated troponin 02/26/2016  . Diabetes mellitus without complication ([place]) [id number]  . Elevated lactic acid level 01/28/2016  . Sepsis ([place]) 01/28/2016  . Acute renal failure superimposed on stage 3 chronic kidney disease ([place]) 04/21/2015  . Hyperglycemia 04/21/2015  . Bronchitis 04/21/2015  . Tachycardia 04/21/2015  . BPH (benign prostatic hypertrophy) 04/21/2015  . Dyspnea 12/28/2012  . Fall 12/28/2012  . OTHER DYSPNEA AND RESPIRATORY ABNORMALITIES 05/27/2010  . Hyperlipidemia 11/27/2008  . HYPERTENSION, BENIGN 11/27/2008  . Atrial fibrillation with RVR ([place]) 11/27/2008  . GERD 11/27/2008  . BPH/LUTS W/O OBSTRUCTION 11/27/2008  . ARTHRITIS 11/27/2008    Past Surgical History:  Procedure Laterality Date  . BACK SURGERY  yrs ago   lower back surgery  . PROSTATE SURGERY  yrs ago, no cancer  . TONSILLECTOMY  as child  . TRANSURETHRAL RESECTION OF PROSTATE  02/22/2012   Procedure: TRANSURETHRAL RESECTION OF THE PROSTATE WITH GYRUS INSTRUMENTS;  Surgeon: Molli Hazard, MD;  Location: [facility];  Service: Urology;  Laterality: N/A;           Home Medications    Prior to Admission medications   Medication Sig Start  Date End Date Taking? Authorizing Provider  aspirin EC 325 MG tablet Take 1 tablet (325 mg total) by mouth daily. 10/16/15   Sherren Mocha, MD  donepezil (ARICEPT) 5 MG tablet Take 5 mg by mouth daily. 01/09/16   Historical Provider, MD  finasteride (PROSCAR) 5 MG tablet Take 5 mg by mouth daily.    Historical Provider, MD  furosemide (LASIX) 20 MG tablet Take 1 tablet (20 mg total) by mouth daily. 05/25/16   Sherren Mocha, MD  guaiFENesin (MUCINEX) 600 MG 12 hr tablet Take 1,200 mg by mouth every 12 (twelve) hours. Reported on  04/09/2016    Historical Provider, MD  metoprolol (LOPRESSOR) 50 MG tablet Take 50 mg by mouth at bedtime.    Historical Provider, MD  metoprolol succinate (TOPROL-XL) 50 MG 24 hr tablet TAKE 1 TABLET (50 MG TOTAL) BY MOUTH AT BEDTIME. 10/16/16   Sherren Mocha, MD  oxybutynin (DITROPAN-XL) 5 MG 24 hr tablet Take 5 mg by mouth daily. 04/16/16   Historical Provider, MD    Family History Family History  Problem Relation Age of Onset  . Heart attack Father     Social History Social History  Substance Use Topics  . Smoking status: Former Smoker    Packs/day: 0.50    Years: 50.00    Types: Cigarettes    Quit date: 12/21/1980  . Smokeless tobacco: Never Used  . Alcohol use 0.0 oz/week     Comment: rare use     Allergies   Sulfa antibiotics   Review of Systems Review of Systems  Constitutional: Negative for chills and fever.       Increased falls today   HENT: Positive for rhinorrhea.   Respiratory: Positive for cough. Negative for chest tightness and shortness of breath.   Cardiovascular: Negative for chest pain and palpitations.  Gastrointestinal: Negative for abdominal pain, nausea and vomiting.  Genitourinary: Positive for dysuria ("for months").  Musculoskeletal: Negative for neck pain and neck stiffness.  Neurological: Negative for dizziness, speech difficulty, weakness, light-headedness and headaches.  Psychiatric/Behavioral:       Wife reports he may be slightly confused. Reports he does have "mild" dementia.      Physical Exam Updated Vital Signs BP 123/91   Pulse 99   Temp 97.6 F (36.4 C) (Rectal)   Resp 25   Ht 5\' 7"  (1.702 m)   Wt 94.3 kg   SpO2 96%   BMI 32.58 kg/m   Physical Exam  Constitutional: No distress.  HENT:  Head: Normocephalic and atraumatic.  Mouth/Throat: Oropharynx is clear and moist.  Eyes: Conjunctivae and EOM are normal. Pupils are equal, round, and reactive to light. Right eye exhibits no discharge. Left eye exhibits no discharge.    Coloboma of the right pupil  Neck: Normal range of motion. Neck supple.  Cardiovascular:  No murmur heard. Irregularly irregular. Rate 106  Pulmonary/Chest: Effort normal and breath sounds normal. He has no wheezes. He has no rales.  Abdominal: Soft. Bowel sounds are normal. He exhibits no distension. There is no tenderness.  Musculoskeletal: Normal range of motion. He exhibits no edema, tenderness or deformity.  Lymphadenopathy:    He has no cervical adenopathy.  Neurological: He is alert. No cranial nerve deficit.  Oriented to person and place but not year (baseline per wife) Ringer to nose test on left arm is slower compared to right.  Strength 5/5 in upper and lower extremities bilaterally  Normal sensation to light touch in extremities bilaterally.   Skin:  Skin is warm and dry. Capillary refill takes less than 2 seconds. He is not diaphoretic.  Psychiatric: He has a normal mood and affect. His behavior is normal.     ED Treatments / Results  Labs (all labs ordered are listed, but only abnormal results are displayed) Labs Reviewed  CBC WITH DIFFERENTIAL/PLATELET - Abnormal; Notable for the following:       Result Value   WBC 17.2 (*)    Neutro Abs 15.2 (*)    Monocytes Absolute 1.2 (*)    All other components within normal limits  COMPREHENSIVE METABOLIC PANEL - Abnormal; Notable for the following:    CO2 20 (*)    Glucose, Bld 172 (*)    BUN 25 (*)    Creatinine, Ser 1.82 (*)    GFR calc non Af Amer 32 (*)    GFR calc Af Amer 37 (*)    Anion gap 16 (*)    All other components within normal limits  I-STAT CG4 LACTIC ACID, ED - Abnormal; Notable for the following:    Lactic Acid, Venous 2.87 (*)    All other components within normal limits  URINALYSIS, ROUTINE W REFLEX MICROSCOPIC  I-STAT TROPOININ, ED  Randolm Idol, ED    EKG  EKG Interpretation None       Radiology Dg Chest 2 View  Result Date: 01/01/2017 CLINICAL DATA:  Fall EXAM: CHEST  2 VIEW  COMPARISON:  03/12/2016 FINDINGS: Normal heart size. Lungs are under aerated and grossly clear. Interstitial prominence is stable. No pneumothorax or pleural effusion. IMPRESSION: No active cardiopulmonary disease. Electronically Signed   By: Marybelle Killings M.D.   On: 01/01/2017 09:07   Ct Head Wo Contrast  Result Date: 01/01/2017 CLINICAL DATA:  Multiple falls out of bed this morning. EXAM: CT HEAD WITHOUT CONTRAST TECHNIQUE: Contiguous axial images were obtained from the base of the skull through the vertex without intravenous contrast. COMPARISON:  01/28/2016 FINDINGS: Brain: There is diffuse moderately severe cerebral cortical atrophy and cerebellar atrophy with secondary ventricular dilatation and extensive periventricular white matter disease. The findings are stable. There is no acute hemorrhage, acute infarction, or intracranial mass lesion. Vascular: No hyperdense vessel or unexpected calcification. Skull: Normal. Negative for fracture or focal lesion. Sinuses/Orbits: Normal. Other: None. IMPRESSION: 1. No acute intracranial abnormality. 2. Chronic atrophy with chronic small vessel ischemic changes. Electronically Signed   By: Lorriane Shire M.D.   On: 01/01/2017 09:25    Procedures Procedures (including critical care time)  Medications Ordered in ED Medications  sodium chloride 0.9 % bolus 500 mL (0 mLs Intravenous Stopped 01/01/17 1145)  sodium chloride 0.9 % bolus 500 mL (0 mLs Intravenous Stopped 01/01/17 1120)     Initial Impression / Assessment and Plan / ED Course  I have reviewed the triage vital signs and the nursing notes.  Pertinent labs & imaging results that were available during my care of the patient were reviewed by me and considered in my medical decision making (see chart for details).  Clinical Course    Patient with increased frequency of falls today and possibly slightly confused per wife. Patient is alert and oriented x 2 (at baseline not oriented to year). Patient  mildly tachycardic otherwise vitals are stable. Neuro exam normal except slow finger to nose testing on the right arm. Will obtain CBC, BMP, Lactic acid, CXR, UA, EKG, istatp trop, CT head.   CXR report with no acute abnormality. CBC with leukocytosis and neutrophilia otherwise unremarkable. Meets SIRS  criteria with tachycardia and leukocytosis. Lactic acid 2.87. CMP with mildly low bicarb likely due to lactic acidosis. Will give 500cc x 2 bolus and re-evaluate. EKG with afib with normal HR; similar to prior. istat trop 0.00. CT head with no acute abnormality but chronic atrophy.  UA is negative for infection.  HR improved after IVF. Leukocytosis most likely due to viral URI. Will see if patient is able to ambulate.  Staff attempted ambulation but patient to ambulate. Will call Hospitalist for admission.   Final Clinical Impressions(s) / ED Diagnoses   Final diagnoses:  Fall, initial encounter  Dehydration  Lactic acidosis    New Prescriptions New Prescriptions   No medications on file     Smiley Houseman, MD 01/01/17 [place], MD 01/02/17 1256

## 2017-01-01 NOTE — ED Notes (Signed)
EKG given to Dr. Isaacs 

## 2017-01-01 NOTE — Progress Notes (Signed)
New Admission Note: transfer from ED  Arrival Method: stretcher  Mental Orientation: alert to person disoruiented to place time situation Telemetry: none Assessment: Completed Skin:slean dry intact IV:RAC NS 75 Pain: none Tubes: none Safety Measures: Safety Fall Prevention Plan has been given, discussed and signed Admission: Completed Unit Orientation: Patient has been orientated to the room, unit and staff.  Family:none present  Orders have been reviewed and implemented. Will continue to monitor the patient. Call light has been placed within reach and bed alarm has been activated.   Retta Mac BSN, RN

## 2017-01-01 NOTE — ED Notes (Signed)
Pt taken to CT and xray

## 2017-01-01 NOTE — ED Triage Notes (Signed)
Pt in from home via EMS after 3 falls out of bed this morning. Unknown if pt takes blood thinners, no obvious injuries noted, pt denies pain. Per EMS, falls have been more frequent, pt c/o incr bilateral leg weakness, usually walks with walker. Arrives a&ox4, VSS, CBG 197

## 2017-01-02 ENCOUNTER — Observation Stay (HOSPITAL_COMMUNITY): Payer: Medicare Other

## 2017-01-02 DIAGNOSIS — F0390 Unspecified dementia without behavioral disturbance: Secondary | ICD-10-CM | POA: Diagnosis not present

## 2017-01-02 DIAGNOSIS — R410 Disorientation, unspecified: Secondary | ICD-10-CM | POA: Diagnosis not present

## 2017-01-02 DIAGNOSIS — R296 Repeated falls: Secondary | ICD-10-CM

## 2017-01-02 LAB — GLUCOSE, CAPILLARY
GLUCOSE-CAPILLARY: 165 mg/dL — AB (ref 65–99)
GLUCOSE-CAPILLARY: 110 mg/dL — AB (ref 65–99)
Glucose-Capillary: 113 mg/dL — ABNORMAL HIGH (ref 65–99)
Glucose-Capillary: 176 mg/dL — ABNORMAL HIGH (ref 65–99)
Glucose-Capillary: 131 mg/dL — ABNORMAL HIGH (ref 65–99)

## 2017-01-02 LAB — RESPIRATORY PANEL BY PCR
Adenovirus: NOT DETECTED
BORDETELLA PERTUSSIS-RVPCR: NOT DETECTED
CORONAVIRUS 229E-RVPPCR: NOT DETECTED
CORONAVIRUS HKU1-RVPPCR: NOT DETECTED
Chlamydophila pneumoniae: NOT DETECTED
Coronavirus NL63: NOT DETECTED
Coronavirus OC43: NOT DETECTED
INFLUENZA B-RVPPCR: NOT DETECTED
Influenza A: NOT DETECTED
Metapneumovirus: NOT DETECTED
Mycoplasma pneumoniae: NOT DETECTED
PARAINFLUENZA VIRUS 1-RVPPCR: NOT DETECTED
PARAINFLUENZA VIRUS 2-RVPPCR: NOT DETECTED
Parainfluenza Virus 3: NOT DETECTED
Parainfluenza Virus 4: NOT DETECTED
RESPIRATORY SYNCYTIAL VIRUS-RVPPCR: NOT DETECTED
Rhinovirus / Enterovirus: NOT DETECTED

## 2017-01-02 LAB — CBC
HEMATOCRIT: 40.6 % (ref 39.0–52.0)
Hemoglobin: 14.2 g/dL (ref 13.0–17.0)
MCH: 32.4 pg (ref 26.0–34.0)
MCHC: 35 g/dL (ref 30.0–36.0)
MCV: 92.7 fL (ref 78.0–100.0)
Platelets: 166 10*3/uL (ref 150–400)
RBC: 4.38 MIL/uL (ref 4.22–5.81)
RDW: 13.3 % (ref 11.5–15.5)
WBC: 9.3 10*3/uL (ref 4.0–10.5)

## 2017-01-02 LAB — TROPONIN I

## 2017-01-02 LAB — COMPREHENSIVE METABOLIC PANEL
ALBUMIN: 3.3 g/dL — AB (ref 3.5–5.0)
ALT: 15 U/L — AB (ref 17–63)
AST: 20 U/L (ref 15–41)
Alkaline Phosphatase: 72 U/L (ref 38–126)
Anion gap: 11 (ref 5–15)
BUN: 19 mg/dL (ref 6–20)
CHLORIDE: 106 mmol/L (ref 101–111)
CO2: 22 mmol/L (ref 22–32)
CREATININE: 1.57 mg/dL — AB (ref 0.61–1.24)
Calcium: 9 mg/dL (ref 8.9–10.3)
GFR calc Af Amer: 44 mL/min — ABNORMAL LOW (ref >60)
GFR calc non Af Amer: 38 mL/min — ABNORMAL LOW (ref >60)
GLUCOSE: 121 mg/dL — AB (ref 65–99)
POTASSIUM: 3.6 mmol/L (ref 3.5–5.1)
Sodium: 139 mmol/L (ref 135–145)
Total Bilirubin: 1.2 mg/dL (ref 0.3–1.2)
Total Protein: 6.5 g/dL (ref 6.5–8.1)

## 2017-01-02 LAB — HEMOGLOBIN A1C
Hgb A1c MFr Bld: 7.2 % — ABNORMAL HIGH (ref 4.8–5.6)
Mean Plasma Glucose: 160 mg/dL

## 2017-01-02 LAB — INFLUENZA PANEL BY PCR (TYPE A & B)
INFLAPCR: NEGATIVE
INFLBPCR: NEGATIVE

## 2017-01-02 MED ORDER — DEXTROSE 50 % IV SOLN
INTRAVENOUS | Status: AC
  Filled 2017-01-02: qty 50

## 2017-01-02 MED ORDER — GUAIFENESIN ER 600 MG PO TB12
600.0000 mg | ORAL_TABLET | Freq: Two times a day (BID) | ORAL | Status: DC
  Administered 2017-01-02 – 2017-01-04 (×4): 600 mg via ORAL
  Filled 2017-01-02 (×4): qty 1

## 2017-01-02 NOTE — Evaluation (Signed)
Physical Therapy Evaluation Patient Details Name: [name] [name] MRN: [id number] DOB: 1926/06/27 Today's Date: 01/02/2017   History of Present Illness  Patient is an 83 yo male admitted 01/01/17 with frequent falls (3 falls day pta), weakness, and increased confusion.   PMH:  Afib, frequent falls, CHF, CAD, COPD, dementia, HTN, HLD, back surgery, CKD, DM  Clinical Impression  Patient presents with problems listed below.  Will benefit from acute PT to maximize functional mobility prior to discharge.  Patient with significant weakness throughout, with LUE/LLE weaker than Rt side.  Patient unable to stand with max assist.  Recommended use of lift equipment to nursing for OOB.  Feel patient will require inpatient setting for continued therapy prior to return home - recommend SNF at d/c for continued therapy.    Follow Up Recommendations SNF;Supervision/Assistance - 24 hour    Equipment Recommendations  Wheelchair (measurements PT);Wheelchair cushion (measurements PT)    Recommendations for Other Services       Precautions / Restrictions Precautions Precautions: Fall Precaution Comments: multiple falls pta Restrictions Weight Bearing Restrictions: No      Mobility  Bed Mobility               General bed mobility comments: Patient in chair  Transfers Overall transfer level: Needs assistance Equipment used: Rolling walker (2 wheeled) Transfers: Sit to/from Stand Sit to Stand: Max assist         General transfer comment: Repeated verbal cues for hand placement on armrests, and for technique to move to standing.  Patient requiring mod assist to lean forward in chair, and to scoot to edge of chair.  Step-by-step instructions to move to standing.  Provided max assist and patient unable to reach standing position.  On first attempt, patient leaning severely backward.  On second attempt, able to move trunk over feet, and clear hips from chair.  But collapsed into chair when lifting  hand off of armrest to reach for RW.    Ambulation/Gait             General Gait Details: Unable  Stairs            Wheelchair Mobility    Modified Rankin (Stroke Patients Only)       Balance Overall balance assessment: Needs assistance;History of Falls Sitting-balance support: Bilateral upper extremity supported;Feet supported Sitting balance-Leahy Scale: Poor Sitting balance - Comments: Requires UE support and assist to maintain sitting balance in chair. Postural control: Posterior lean Standing balance support: Bilateral upper extremity supported Standing balance-Leahy Scale: Zero                               Pertinent Vitals/Pain Pain Assessment: Faces Faces Pain Scale: Hurts a little bit Pain Location: low back Pain Descriptors / Indicators: Aching;Sore Pain Intervention(s): Monitored during session;Repositioned    Home Living Family/patient expects to be discharged to:: Skilled nursing facility Living Arrangements: Spouse/significant other Available Help at Discharge: Family;Available PRN/intermittently Type of Home: House Home Access: Stairs to enter   Entrance Stairs-Number of Steps: 1 Home Layout: One level Home Equipment: Walker - 2 wheels;Cane - single point;Shower seat - built in      Prior Function Level of Independence: Independent with assistive device(s);Needs assistance   Gait / Transfers Assistance Needed: Uses RW to ambulate short distances in house with wife supervising.  ADL's / Homemaking Assistance Needed: Assist with bathing and putting on socks.  Wife prepares meals and cleans  Hand Dominance   Dominant Hand: Right (Patient reports "I use both hands")    Extremity/Trunk Assessment   Upper Extremity Assessment Upper Extremity Assessment: Generalized weakness (Difficulty using LUE - decreased strength/coordination)    Lower Extremity Assessment Lower Extremity Assessment: Generalized  weakness;Difficult to assess due to impaired cognition (LLE weaker than RLE)       Communication   Communication: HOH  Cognition Arousal/Alertness: Awake/alert Behavior During Therapy: WFL for tasks assessed/performed Overall Cognitive Status: Impaired/Different from baseline Area of Impairment: Orientation;Memory;Safety/judgement;Problem solving Orientation Level: Disoriented to;Place;Time;Situation   Memory: Decreased short-term memory   Safety/Judgement: Decreased awareness of safety   Problem Solving: Difficulty sequencing;Requires verbal cues;Requires tactile cues General Comments: Patient states he is in "the [facility] hospital".  States year as 2020.  Difficulty providing PLOF information.    General Comments      Exercises     Assessment/Plan    PT Assessment Patient needs continued PT services  PT Problem List Decreased strength;Decreased activity tolerance;Decreased balance;Decreased mobility;Decreased coordination;Decreased cognition;Decreased knowledge of use of DME;Decreased safety awareness;Obesity;Pain          PT Treatment Interventions DME instruction;Gait training;Functional mobility training;Therapeutic activities;Therapeutic exercise;Balance training;Cognitive remediation;Patient/family education    PT Goals (Current goals can be found in the Care Plan section)  Acute Rehab PT Goals Patient Stated Goal: To get stronger PT Goal Formulation: With patient/family Time For Goal Achievement: 01/16/17 Potential to Achieve Goals: Fair    Frequency Min 3X/week   Barriers to discharge Decreased caregiver support      Co-evaluation               End of Session Equipment Utilized During Treatment: Gait belt Activity Tolerance: Patient limited by fatigue Patient left: in chair;with call bell/phone within reach;with chair alarm set;with family/visitor present Nurse Communication: Mobility status;Need for lift equipment (Recommend SNF)     Functional Assessment Tool Used: Clinical judgement Functional Limitation: Mobility: Walking and moving around Mobility: Walking and Moving Around Current Status [phone]): At least 80 percent but less than 100 percent impaired, limited or restricted Mobility: Walking and Moving Around Goal Status [phone]): At least 20 percent but less than 40 percent impaired, limited or restricted    Time: 1115-1134 PT Time Calculation (min) (ACUTE ONLY): 19 min   Charges:   PT Evaluation $PT Eval Moderate Complexity: 1 Procedure     PT G Codes:   PT G-Codes **NOT FOR INPATIENT CLASS** Functional Assessment Tool Used: Clinical judgement Functional Limitation: Mobility: Walking and moving around Mobility: Walking and Moving Around Current Status [id number] At least 80 percent but less than 100 percent impaired, limited or restricted Mobility: Walking and Moving Around Goal Status [phone]): At least 20 percent but less than 40 percent impaired, limited or restricted    Despina Pole 01/02/2017, 1:25 PM Carita Pian. Sanjuana Kava, [place] Pager [phone]

## 2017-01-02 NOTE — Progress Notes (Addendum)
PROGRESS NOTE  [name] [name] [id number] DOB: 1926/06/09 DOA: 01/01/2017 PCP: Haywood Pao, MD  HPI/Recap of past 24 hours:  Pleasantly demented elderly male sitting in chair with congested cough, denies chest pain, no fever, no lower extremity edema,  Patient is not able to provide history,  wife at bedside, not able to provide much history except she report noticed patient has left sided weakness which is new Nurse assistance report patient need multiple assist to get up  Assessment/Plan: Principal Problem:   Frequent falls Active Problems:   Hyperlipidemia   HYPERTENSION, BENIGN   GERD   Diabetes mellitus without complication (HCC)   COPD (chronic obstructive pulmonary disease) with chronic bronchitis (HCC)   Generalized weakness  Multiple falls/progressive weakness: He has a history of falls and has been falling usually once or twice a month, but the night prior to coming to the hospital , he felt three times Unclear etiology, orthostatic vital signs not able to be completed due to patient is not able to stand up No fever, but he dose has congested cough, will check sputum culture, flu pcr and respiratory viral panel Troponin negative x3, ekg chronic afib, cxr no acute findings Ct head no acute findings on admission, Due to left sided weakness,  will  Get mri brain  Leukocytosis/lactic acidosis presented on admission, this has resolved with gentle hydration, he is off ivf. Continue hold home meds lasix.  HTN; continue toprol  Chronic afib: rate controlled on toprol, not on anticoagulation, likely from h/o falls. He is also not on asa.  Diabetes, previously insulin dependent (per discharge summary a year ago, no insulin or oral diabetes meds on home meds list this admission, per wife there is no change of home meds recently) A1c7.2, on ssi here  CKD III, cr close to baseline He did has mild azotemia on presentation this has resolved with gentle hydration.  BPH  on proscar  Dementia: on aricept   Code Status: full  Family Communication: patient and wife at bedside  Disposition Plan: will need SNF placement, social worker consulted, pending insurance  aurthorization   Consultants:  Education officer, museum  Procedures:  none  Antibiotics:  none   Objective: BP 136/80 (BP Location: Right Arm)   Pulse 89   Temp 98.7 F (37.1 C) (Oral)   Resp 16   Ht 5\' 7"  (1.702 m)   Wt 94.6 kg (208 lb 9.6 oz)   SpO2 98%   BMI 32.67 kg/m   Intake/Output Summary (Last 24 hours) at 01/02/17 1201 Last data filed at 01/02/17 0925  Gross per 24 hour  Intake           1747.5 ml  Output                0 ml  Net           1747.5 ml   Filed Weights   01/01/17 0801 01/01/17 1810 01/01/17 2131  Weight: 94.3 kg (208 lb) 93.2 kg (205 lb 8 oz) 94.6 kg (208 lb 9.6 oz)    Exam:   General:  Very frail, pleasantly demented, but NAD  Cardiovascular: IRRR  Respiratory: diminished, no rhonchi, no wheezing, no rales  Abdomen: Soft/ND/NT, positive BS  Musculoskeletal: No Edema  Neuro: baseline dementia  Data Reviewed: Basic Metabolic Panel:  Recent Labs Lab 01/01/17 0810 01/01/17 1429 01/01/17 1902 01/02/17 0512  NA 137  --  137 139  K 4.1  --  3.6 3.6  CL 101  --  104 106  CO2 20*  --  23 22  GLUCOSE 172*  --  117* 121*  BUN 25*  --  21* 19  CREATININE 1.82*  --  1.66* 1.57*  CALCIUM 9.8  --  9.2 9.0  MG  --  2.1 2.0  --    Liver Function Tests:  Recent Labs Lab 01/01/17 0810 01/01/17 1902 01/02/17 0512  AST 26 22 20   ALT 19 16* 15*  ALKPHOS 82 76 72  BILITOT 1.0 1.1 1.2  PROT 7.9 6.8 6.5  ALBUMIN 4.1 3.6 3.3*   No results for input(s): LIPASE, AMYLASE in the last 168 hours. No results for input(s): AMMONIA in the last 168 hours. CBC:  Recent Labs Lab 01/01/17 0810 01/02/17 0512  WBC 17.2* 9.3  NEUTROABS 15.2*  --   HGB 16.0 14.2  HCT 45.5 40.6  MCV 92.7 92.7  PLT 179 166   Cardiac Enzymes:    Recent Labs Lab  01/01/17 1902 01/01/17 2349 01/02/17 0512  TROPONINI <0.03 <0.03 <0.03   BNP (last 3 results)  Recent Labs  02/26/16 1359  BNP 171.0*    ProBNP (last 3 results) No results for input(s): PROBNP in the last 8760 hours.  CBG:  Recent Labs Lab 01/01/17 1807 01/01/17 2129 01/02/17 0821  GLUCAP 123* 111* 113*    No results found for this or any previous visit (from the past 240 hour(s)).   Studies: No results found.  Scheduled Meds: . docusate sodium  100 mg Oral BID  . donepezil  5 mg Oral Daily  . finasteride  5 mg Oral Daily  . insulin aspart  0-9 Units Subcutaneous TID WC  . metoprolol succinate  50 mg Oral QHS  . oxybutynin  5 mg Oral Daily    Continuous Infusions:   Time spent: 74mins  Macel Yearsley MD, PhD  [facility] Pager [phone]. If 7PM-7AM, please contact night-coverage at [email or web address], password [facility] 01/02/2017, 12:01 PM  LOS: 0 days

## 2017-01-03 ENCOUNTER — Encounter (HOSPITAL_COMMUNITY): Payer: Medicare Other

## 2017-01-03 DIAGNOSIS — E1122 Type 2 diabetes mellitus with diabetic chronic kidney disease: Secondary | ICD-10-CM | POA: Diagnosis present

## 2017-01-03 DIAGNOSIS — J449 Chronic obstructive pulmonary disease, unspecified: Secondary | ICD-10-CM | POA: Diagnosis present

## 2017-01-03 DIAGNOSIS — Z9079 Acquired absence of other genital organ(s): Secondary | ICD-10-CM | POA: Diagnosis not present

## 2017-01-03 DIAGNOSIS — I63421 Cerebral infarction due to embolism of right anterior cerebral artery: Secondary | ICD-10-CM | POA: Diagnosis not present

## 2017-01-03 DIAGNOSIS — R41841 Cognitive communication deficit: Secondary | ICD-10-CM | POA: Diagnosis not present

## 2017-01-03 DIAGNOSIS — E872 Acidosis: Secondary | ICD-10-CM | POA: Diagnosis not present

## 2017-01-03 DIAGNOSIS — R2681 Unsteadiness on feet: Secondary | ICD-10-CM | POA: Diagnosis not present

## 2017-01-03 DIAGNOSIS — R279 Unspecified lack of coordination: Secondary | ICD-10-CM | POA: Diagnosis not present

## 2017-01-03 DIAGNOSIS — Z7982 Long term (current) use of aspirin: Secondary | ICD-10-CM | POA: Diagnosis not present

## 2017-01-03 DIAGNOSIS — E669 Obesity, unspecified: Secondary | ICD-10-CM | POA: Diagnosis present

## 2017-01-03 DIAGNOSIS — N400 Enlarged prostate without lower urinary tract symptoms: Secondary | ICD-10-CM | POA: Diagnosis present

## 2017-01-03 DIAGNOSIS — R296 Repeated falls: Secondary | ICD-10-CM | POA: Diagnosis present

## 2017-01-03 DIAGNOSIS — I2510 Atherosclerotic heart disease of native coronary artery without angina pectoris: Secondary | ICD-10-CM | POA: Diagnosis present

## 2017-01-03 DIAGNOSIS — Z6832 Body mass index (BMI) 32.0-32.9, adult: Secondary | ICD-10-CM | POA: Diagnosis not present

## 2017-01-03 DIAGNOSIS — N183 Chronic kidney disease, stage 3 (moderate): Secondary | ICD-10-CM | POA: Diagnosis present

## 2017-01-03 DIAGNOSIS — E1151 Type 2 diabetes mellitus with diabetic peripheral angiopathy without gangrene: Secondary | ICD-10-CM | POA: Diagnosis present

## 2017-01-03 DIAGNOSIS — Z79899 Other long term (current) drug therapy: Secondary | ICD-10-CM | POA: Diagnosis not present

## 2017-01-03 DIAGNOSIS — Z87891 Personal history of nicotine dependence: Secondary | ICD-10-CM | POA: Diagnosis not present

## 2017-01-03 DIAGNOSIS — I130 Hypertensive heart and chronic kidney disease with heart failure and stage 1 through stage 4 chronic kidney disease, or unspecified chronic kidney disease: Secondary | ICD-10-CM | POA: Diagnosis present

## 2017-01-03 DIAGNOSIS — W19XXXA Unspecified fall, initial encounter: Secondary | ICD-10-CM | POA: Diagnosis present

## 2017-01-03 DIAGNOSIS — F0390 Unspecified dementia without behavioral disturbance: Secondary | ICD-10-CM | POA: Diagnosis present

## 2017-01-03 DIAGNOSIS — Z833 Family history of diabetes mellitus: Secondary | ICD-10-CM | POA: Diagnosis not present

## 2017-01-03 DIAGNOSIS — G4733 Obstructive sleep apnea (adult) (pediatric): Secondary | ICD-10-CM | POA: Diagnosis present

## 2017-01-03 DIAGNOSIS — R339 Retention of urine, unspecified: Secondary | ICD-10-CM | POA: Diagnosis present

## 2017-01-03 DIAGNOSIS — I10 Essential (primary) hypertension: Secondary | ICD-10-CM | POA: Diagnosis not present

## 2017-01-03 DIAGNOSIS — E860 Dehydration: Secondary | ICD-10-CM | POA: Diagnosis not present

## 2017-01-03 DIAGNOSIS — I482 Chronic atrial fibrillation: Secondary | ICD-10-CM | POA: Diagnosis not present

## 2017-01-03 DIAGNOSIS — E785 Hyperlipidemia, unspecified: Secondary | ICD-10-CM | POA: Diagnosis not present

## 2017-01-03 DIAGNOSIS — W06XXXA Fall from bed, initial encounter: Secondary | ICD-10-CM | POA: Diagnosis present

## 2017-01-03 DIAGNOSIS — I63521 Cerebral infarction due to unspecified occlusion or stenosis of right anterior cerebral artery: Secondary | ICD-10-CM | POA: Diagnosis present

## 2017-01-03 DIAGNOSIS — K219 Gastro-esophageal reflux disease without esophagitis: Secondary | ICD-10-CM | POA: Diagnosis present

## 2017-01-03 DIAGNOSIS — E119 Type 2 diabetes mellitus without complications: Secondary | ICD-10-CM | POA: Diagnosis not present

## 2017-01-03 DIAGNOSIS — R278 Other lack of coordination: Secondary | ICD-10-CM | POA: Diagnosis not present

## 2017-01-03 DIAGNOSIS — I5032 Chronic diastolic (congestive) heart failure: Secondary | ICD-10-CM | POA: Diagnosis present

## 2017-01-03 DIAGNOSIS — M6281 Muscle weakness (generalized): Secondary | ICD-10-CM | POA: Diagnosis not present

## 2017-01-03 DIAGNOSIS — R55 Syncope and collapse: Secondary | ICD-10-CM | POA: Diagnosis not present

## 2017-01-03 DIAGNOSIS — W19XXXD Unspecified fall, subsequent encounter: Secondary | ICD-10-CM | POA: Diagnosis not present

## 2017-01-03 LAB — BASIC METABOLIC PANEL
ANION GAP: 8 (ref 5–15)
BUN: 23 mg/dL — ABNORMAL HIGH (ref 6–20)
CALCIUM: 9 mg/dL (ref 8.9–10.3)
CO2: 26 mmol/L (ref 22–32)
CREATININE: 1.65 mg/dL — AB (ref 0.61–1.24)
Chloride: 105 mmol/L (ref 101–111)
GFR calc Af Amer: 41 mL/min — ABNORMAL LOW (ref >60)
GFR, EST NON AFRICAN AMERICAN: 35 mL/min — AB (ref >60)
GLUCOSE: 134 mg/dL — AB (ref 65–99)
Potassium: 3.9 mmol/L (ref 3.5–5.1)
Sodium: 139 mmol/L (ref 135–145)

## 2017-01-03 LAB — CBC WITH DIFFERENTIAL/PLATELET
BASOS ABS: 0 10*3/uL (ref 0.0–0.1)
Basophils Relative: 0 %
EOS PCT: 3 %
Eosinophils Absolute: 0.4 10*3/uL (ref 0.0–0.7)
HEMATOCRIT: 40.7 % (ref 39.0–52.0)
Hemoglobin: 13.7 g/dL (ref 13.0–17.0)
LYMPHS PCT: 19 %
Lymphs Abs: 2 10*3/uL (ref 0.7–4.0)
MCH: 32.1 pg (ref 26.0–34.0)
MCHC: 33.7 g/dL (ref 30.0–36.0)
MCV: 95.3 fL (ref 78.0–100.0)
MONO ABS: 0.9 10*3/uL (ref 0.1–1.0)
MONOS PCT: 9 %
NEUTROS ABS: 7.3 10*3/uL (ref 1.7–7.7)
Neutrophils Relative %: 69 %
PLATELETS: 168 10*3/uL (ref 150–400)
RBC: 4.27 MIL/uL (ref 4.22–5.81)
RDW: 13.6 % (ref 11.5–15.5)
WBC: 10.7 10*3/uL — ABNORMAL HIGH (ref 4.0–10.5)

## 2017-01-03 LAB — HEMOGLOBIN A1C
Hgb A1c MFr Bld: 7 % — ABNORMAL HIGH (ref 4.8–5.6)
MEAN PLASMA GLUCOSE: 154 mg/dL

## 2017-01-03 LAB — GLUCOSE, CAPILLARY
GLUCOSE-CAPILLARY: 150 mg/dL — AB (ref 65–99)
Glucose-Capillary: 126 mg/dL — ABNORMAL HIGH (ref 65–99)
Glucose-Capillary: 142 mg/dL — ABNORMAL HIGH (ref 65–99)
Glucose-Capillary: 175 mg/dL — ABNORMAL HIGH (ref 65–99)

## 2017-01-03 LAB — LIPID PANEL
CHOL/HDL RATIO: 4.5 ratio
Cholesterol: 143 mg/dL (ref 0–200)
HDL: 32 mg/dL — AB (ref >40)
LDL CALC: 98 mg/dL (ref 0–99)
TRIGLYCERIDES: 64 mg/dL (ref <150)
VLDL: 13 mg/dL (ref 0–40)

## 2017-01-03 MED ORDER — ASPIRIN 325 MG PO TABS
325.0000 mg | ORAL_TABLET | Freq: Every day | ORAL | Status: DC
  Administered 2017-01-03 – 2017-01-04 (×2): 325 mg via ORAL
  Filled 2017-01-03 (×2): qty 1

## 2017-01-03 MED ORDER — HEPARIN SODIUM (PORCINE) 5000 UNIT/ML IJ SOLN
5000.0000 [IU] | Freq: Three times a day (TID) | INTRAMUSCULAR | Status: DC
  Administered 2017-01-03 – 2017-01-04 (×2): 5000 [IU] via SUBCUTANEOUS
  Filled 2017-01-03 (×2): qty 1

## 2017-01-03 NOTE — Care Management Note (Signed)
Case Management Note  Patient Details  Name: [name] [name] MRN: [id number] Date of Birth: 1926-02-08  Subjective/Objective:                  left sided weakness Action/Plan: Discharge planning Expected Discharge Date:  01/05/17               Expected Discharge Plan:  Skilled Nursing Facility  In-House Referral:  Clinical Social Work  Discharge planning Services  CM Consult  Post Acute Care Choice:    Choice offered to:  Spouse, Adult Children, Patient  DME Arranged:  N/A DME Agency:  NA  HH Arranged:  NA HH Agency:  NA  Status of Service:  Completed, signed off  If discussed at [employer] of Stay Meetings, dates discussed:    Additional Comments: CM met with pt in room to secure permission to speak with his family concerning MOON and disposition; pt gave permission.  Cm called [name] [name] [phone] and explained MOON (pt requested I leave copy in pt's room) and Ms. Bogle is requesting [name] [name] over any [facility] and to please call daughter [name] [name] [phone] to discuss same options.  CM called [name] and spoke with [name] and [name]'s husband on speaker phone and both state home is NOT an option and pt must be placed as pt's wife is frail and cannot take care of pt.  CM called [place], [phone] and relayed aforementioned and request for [facility] per family's request.  No other CM needs were communicated. Dellie Catholic, RN 01/03/2017, 9:39 AM

## 2017-01-03 NOTE — Progress Notes (Addendum)
PROGRESS NOTE  [name] [name] [id number] DOB: 1926/02/15 DOA: 01/01/2017 PCP: Haywood Pao, MD  HPI/Recap of past 24 hours:  Pleasantly demented elderly male sitting in chair with congested cough, denies chest pain, no fever, no lower extremity edema,  Patient is not able to provide history,  wife at bedside, report patient was taken off blood thinner because he does not take it  Assessment/Plan: Principal Problem:   Frequent falls Active Problems:   Hyperlipidemia   HYPERTENSION, BENIGN   GERD   Diabetes mellitus without complication (HCC)   COPD (chronic obstructive pulmonary disease) with chronic bronchitis (HCC)   Generalized weakness  Acute right ACA nonhemorrhagic stroke: CT head no acute findings on admission, Due to left sided weakness,  MRI brain obtained and showed acute cva He was taken off anticoagulation a while ago due to not taking it per family Neurology consulted, will follow neuro recommendation   Multiple falls/progressive weakness: He has a history of falls and has been falling usually once or twice a month, but the night prior to coming to the hospital , he felt three times orthostatic vital signs not able to be completed due to patient is not able to stand up He is found to have acute right ACA stroke PT/OT, fall precaution, snf placement   congested cough,  flu pcr and respiratory viral panel negative, awaiting sputum sample to be collected Troponin negative x3, ekg chronic afib, cxr no acute findings Per family this is chronic, start mucinex   Leukocytosis/lactic acidosis presented on admission, this has resolved with gentle hydration, he is off ivf. Continue hold home meds lasix. ua unremarkable, cxr no acute findings.   HTN; continue toprol  Chronic afib: rate controlled on toprol, he was taken off anticoagulation a while ago. He is also not on asa.  Diabetes, previously insulin dependent (per discharge summary a year ago, no  insulin or oral diabetes meds on home meds list this admission, per wife there is no change of home meds recently) A1c7.2, on ssi here  CKD III, cr close to baseline He did has mild azotemia on presentation this has resolved with gentle hydration.  BPH on proscar  Dementia: on aricept   Code Status: full  Family Communication: patient and wife at bedside, daughter vickie and son in law over the phone  Disposition Plan: SNF placement, social worker consulted,    Consultants:  Education officer, museum  neurology  Procedures:  none  Antibiotics:  none   Objective: BP 123/74 (BP Location: Right Arm)   Pulse 68   Temp 97.4 F (36.3 C) (Oral)   Resp 17   Ht 5\' 7"  (1.702 m)   Wt 95.3 kg (210 lb 1.6 oz)   SpO2 96%   BMI 32.91 kg/m   Intake/Output Summary (Last 24 hours) at 01/03/17 0845 Last data filed at 01/03/17 0600  Gross per 24 hour  Intake            727.5 ml  Output                0 ml  Net            727.5 ml   Filed Weights   01/01/17 1810 01/01/17 2131 01/02/17 2101  Weight: 93.2 kg (205 lb 8 oz) 94.6 kg (208 lb 9.6 oz) 95.3 kg (210 lb 1.6 oz)    Exam:   General:  Very frail, pleasantly demented, but NAD  Cardiovascular: IRRR  Respiratory: diminished, no rhonchi, no  wheezing, no rales  Abdomen: Soft/ND/NT, positive BS  Musculoskeletal: No Edema  Neuro: baseline dementia, left sided subtle weakness  Data Reviewed: Basic Metabolic Panel:  Recent Labs Lab 01/01/17 0810 01/01/17 1429 01/01/17 1902 01/02/17 0512 01/03/17 0513  NA 137  --  137 139 139  K 4.1  --  3.6 3.6 3.9  CL 101  --  104 106 105  CO2 20*  --  23 22 26   GLUCOSE 172*  --  117* 121* 134*  BUN 25*  --  21* 19 23*  CREATININE 1.82*  --  1.66* 1.57* 1.65*  CALCIUM 9.8  --  9.2 9.0 9.0  MG  --  2.1 2.0  --   --    Liver Function Tests:  Recent Labs Lab 01/01/17 0810 01/01/17 1902 01/02/17 0512  AST 26 22 20   ALT 19 16* 15*  ALKPHOS 82 76 72  BILITOT 1.0 1.1 1.2    PROT 7.9 6.8 6.5  ALBUMIN 4.1 3.6 3.3*   No results for input(s): LIPASE, AMYLASE in the last 168 hours. No results for input(s): AMMONIA in the last 168 hours. CBC:  Recent Labs Lab 01/01/17 0810 01/02/17 0512 01/03/17 0513  WBC 17.2* 9.3 10.7*  NEUTROABS 15.2*  --  7.3  HGB 16.0 14.2 13.7  HCT 45.5 40.6 40.7  MCV 92.7 92.7 95.3  PLT 179 166 168   Cardiac Enzymes:    Recent Labs Lab 01/01/17 1902 01/01/17 2349 01/02/17 0512  TROPONINI <0.03 <0.03 <0.03   BNP (last 3 results)  Recent Labs  02/26/16 1359  BNP 171.0*    ProBNP (last 3 results) No results for input(s): PROBNP in the last 8760 hours.  CBG:  Recent Labs Lab 01/02/17 1200 01/02/17 1521 01/02/17 1809 01/02/17 2059 01/03/17 0717  GLUCAP 176* 165* 110* 131* 126*    Recent Results (from the past 240 hour(s))  Respiratory Panel by PCR     Status: None   Collection Time: 01/02/17  2:58 PM  Result Value Ref Range Status   Adenovirus NOT DETECTED NOT DETECTED Final   Coronavirus 229E NOT DETECTED NOT DETECTED Final   Coronavirus HKU1 NOT DETECTED NOT DETECTED Final   Coronavirus NL63 NOT DETECTED NOT DETECTED Final   Coronavirus OC43 NOT DETECTED NOT DETECTED Final   Metapneumovirus NOT DETECTED NOT DETECTED Final   Rhinovirus / Enterovirus NOT DETECTED NOT DETECTED Final   Influenza A NOT DETECTED NOT DETECTED Final   Influenza B NOT DETECTED NOT DETECTED Final   Parainfluenza Virus 1 NOT DETECTED NOT DETECTED Final   Parainfluenza Virus 2 NOT DETECTED NOT DETECTED Final   Parainfluenza Virus 3 NOT DETECTED NOT DETECTED Final   Parainfluenza Virus 4 NOT DETECTED NOT DETECTED Final   Respiratory Syncytial Virus NOT DETECTED NOT DETECTED Final   Bordetella pertussis NOT DETECTED NOT DETECTED Final   Chlamydophila pneumoniae NOT DETECTED NOT DETECTED Final   Mycoplasma pneumoniae NOT DETECTED NOT DETECTED Final     Studies: Mr Brain 27 Contrast  Result Date: 01/02/2017 CLINICAL DATA:   83 year old male with dementia, atrial fibrillation, diabetes, hypertension, and hyperlipidemia presents with increasing falls, confusion, and weakness. EXAM: MRI HEAD WITHOUT CONTRAST TECHNIQUE: Multiplanar, multiecho pulse sequences of the brain and surrounding structures were obtained without intravenous contrast. COMPARISON:  CT head 01/01/2017. FINDINGS: Brain: Multiple areas of acute infarction are present. In the RIGHT parasagittal frontal lobe, anterior to the genu corpus callosum, there is a 7 x 9 mm region of acute cortical infarction, RIGHT  ACA territory. There is an additional 5 x 10 mm cross-section area of acute infarction, more posteriorly, deep in the interhemispheric fissure, still parasagittal frontal lobe, also RIGHT ACA territory. More posteriorly, spanning the subcortical and periventricular white matter, is is a vertical/linear area of acute infarction favored to represent acute watershed insult, between the anterior/middle and/or middle/posterior cerebral artery vascular territories, as seen on image 43 series 4. No hemorrhage, mass lesion, or extra-axial fluid. Extensive atrophy. Hydrocephalus ex vacuo. Widespread T2 and FLAIR hyperintensities throughout the white matter representing chronic microvascular ischemic change. Vascular: Normal flow voids including proximal RIGHT ACA. Skull and upper cervical spine: Grossly normal marrow signal. No tonsillar herniation. Sinuses/Orbits: No orbital findings of significance. No layering fluid. BILATERAL cataract extraction. Other: None. Compared with recent CT, the infarcts are not visible. IMPRESSION: Multiple RIGHT hemisphere infarcts consistent with RIGHT anterior cerebral artery territory ischemia. More anteriorly, parasagittal cortex involved. More posteriorly, a watershed pattern is seen. Advanced atrophy and small vessel disease. Electronically Signed   By: Staci Righter M.D.   On: 01/02/2017 20:43    Scheduled Meds: . aspirin  325 mg Oral  Daily  . dextrose      . docusate sodium  100 mg Oral BID  . donepezil  5 mg Oral Daily  . finasteride  5 mg Oral Daily  . guaiFENesin  600 mg Oral BID  . insulin aspart  0-9 Units Subcutaneous TID WC  . metoprolol succinate  50 mg Oral QHS  . oxybutynin  5 mg Oral Daily    Continuous Infusions:   Time spent: 51mins  Gavynn Duvall MD, PhD  [facility] Pager [phone]. If 7PM-7AM, please contact night-coverage at [email or web address], password [facility] 01/03/2017, 8:45 AM  LOS: 0 days

## 2017-01-03 NOTE — Consult Note (Signed)
Chief Complaint: Falls HPI: [name] [name] is an 83 y.o. male with previous history of atrial fibrillation and was taken off of anticoagulations about a year ago due to noncompliance. Friday night patient had approximately 3 falls and after the third fall wife decided to bring her to the emergency room. She reports she noted some left-sided weakness and difficulty ablating at that time. Patient is a poor historian with baseline dementia. He is very pleasant and attempts to provide history. He tells me he came into the hospital because of passing out. He denied any facial droop, changes in vision, numbness, problems swallowing, problems with bilateral bladder control. He does not take any aspirin at home currently. Patient had an MRI of the brain as part of the workup for falls which showed right ACA territory stroke.  History of Stroke: No.  Date last known well:Date: 01/01/2017 Time last known well: Unable to determine  tPA Given: No: Outside of treatment in the  Past Medical History:  Diagnosis Date  . Arthritis   . Chronic atrial fibrillation ([place])   . Diabetes mellitus without complication ([place])   . Hyperlipidemia   . Hypertension   . Sleep apnea    no cpap used, sleep study was several yrs ago  . Urinary retention foley last changed 3 weeks ago    Past Surgical History:  Procedure Laterality Date  . BACK SURGERY  yrs ago   lower back surgery  . PROSTATE SURGERY  yrs ago, no cancer  . TONSILLECTOMY  as child  . TRANSURETHRAL RESECTION OF PROSTATE  02/22/2012   Procedure: TRANSURETHRAL RESECTION OF THE PROSTATE WITH GYRUS INSTRUMENTS;  Surgeon: Molli Hazard, MD;  Location: [facility];  Service: Urology;  Laterality: N/A;        Family History  Problem Relation Age of Onset  . Heart attack Father    Social History:  reports that he quit smoking about 36 years ago. His smoking use included Cigarettes. He has a 25.00 pack-year smoking history. He has never used smokeless  tobacco. He reports that he drinks alcohol. He reports that he does not use drugs.  Allergies:  Allergies  Allergen Reactions  . Sulfa Antibiotics Swelling    hands    Medications Prior to Admission  Medication Sig Dispense Refill  . donepezil (ARICEPT) 5 MG tablet Take 5 mg by mouth daily.  6  . finasteride (PROSCAR) 5 MG tablet Take 5 mg by mouth daily.    . furosemide (LASIX) 20 MG tablet Take 1 tablet (20 mg total) by mouth daily. 90 tablet 3  . metoprolol succinate (TOPROL-XL) 50 MG 24 hr tablet TAKE 1 TABLET (50 MG TOTAL) BY MOUTH AT BEDTIME. 30 tablet 7  . Multiple Vitamin (MULTIVITAMIN WITH MINERALS) TABS tablet Take 1 tablet by mouth daily.    Marland Kitchen oxybutynin (DITROPAN-XL) 5 MG 24 hr tablet Take 5 mg by mouth daily.  3    ROS: 10 point review of system was negative.  Physical Examination: Blood pressure 126/77, pulse 66, temperature 97.8 F (36.6 C), temperature source Oral, resp. rate 18, height 5' 7" (1.702 m), weight 95.3 kg (210 lb 1.6 oz), SpO2 96 %.  HEENT-  Normocephalic, no lesions, without obvious abnormality. Neck supple. No stiffness or rigidity was noted. Cardiovascular - regular rate and rhythm, S1, S2 normal, no murmur, click, rub or gallop Lungs - chest clear, no wheezing, rales, normal symmetric air entry, Heart exam - S1, S2 normal, no murmur, no gallop, rate regular  Abdomen - nontender and nondistended  Neurologic Examination: Mental status: Awake alert and oriented to person and month. He was not able to tell me the year as 2018 and had difficulty naming the hospital. He knew he was in [place]: No evidence of dysarthria was noted. Comprehension intact. Naming intact. Fluent. Cranial nerves: Right eye elliptical and nonreactive to light. Left eye approximately 3 mm and reactive to light. Very mild left nasolabial fold flattening was noted. Extremity muscles intact. Uvula midline. Shrug shoulders equally and symmetrically. Tongue  midline. Motor: Right upper and lower extremities 5/5. Left upper shoulder 4/5 with drift. Left lower extremity 3/5. Sensory: Normal and symmetrical to light touch throughout. Coordination: Normal finger-to-finger and heel-to-shin. Gait: Deferred  Results for orders placed or performed during the hospital encounter of 01/01/17 (from the past 48 hour(s))  Lactic acid, plasma     Status: None   Collection Time: 01/01/17  2:29 PM  Result Value Ref Range   Lactic Acid, Venous 1.3 0.5 - 1.9 mmol/L  Vitamin B12     Status: None   Collection Time: 01/01/17  2:29 PM  Result Value Ref Range   Vitamin B-12 624 180 - 914 pg/mL    Comment: (NOTE) This assay is not validated for testing neonatal or myeloproliferative syndrome specimens for Vitamin B12 levels.   TSH     Status: None   Collection Time: 01/01/17  2:29 PM  Result Value Ref Range   TSH 2.893 0.350 - 4.500 uIU/mL    Comment: Performed by a 3rd Generation assay with a functional sensitivity of <=0.01 uIU/mL.  Magnesium     Status: None   Collection Time: 01/01/17  2:29 PM  Result Value Ref Range   Magnesium 2.1 1.7 - 2.4 mg/dL  I-Stat Troponin, ED - 0, 3, 6 hours (not at [facility])     Status: None   Collection Time: 01/01/17  2:34 PM  Result Value Ref Range   Troponin i, poc 0.00 0.00 - 0.08 ng/mL   Comment 3            Comment: Due to the release kinetics of cTnI, a negative result within the first hours of the onset of symptoms does not rule out myocardial infarction with certainty. If myocardial infarction is still suspected, repeat the test at appropriate intervals.   Glucose, capillary     Status: Abnormal   Collection Time: 01/01/17  6:07 PM  Result Value Ref Range   Glucose-Capillary 123 (H) 65 - 99 mg/dL  Lactic acid, plasma     Status: None   Collection Time: 01/01/17  7:02 PM  Result Value Ref Range   Lactic Acid, Venous 1.1 0.5 - 1.9 mmol/L  Comprehensive metabolic panel     Status: Abnormal   Collection Time:  01/01/17  7:02 PM  Result Value Ref Range   Sodium 137 135 - 145 mmol/L   Potassium 3.6 3.5 - 5.1 mmol/L   Chloride 104 101 - 111 mmol/L   CO2 23 22 - 32 mmol/L   Glucose, Bld 117 (H) 65 - 99 mg/dL   BUN 21 (H) 6 - 20 mg/dL   Creatinine, Ser 1.66 (H) 0.61 - 1.24 mg/dL   Calcium 9.2 8.9 - 10.3 mg/dL   Total Protein 6.8 6.5 - 8.1 g/dL   Albumin 3.6 3.5 - 5.0 g/dL   AST 22 15 - 41 U/L   ALT 16 (L) 17 - 63 U/L   Alkaline Phosphatase 76 38 - 126  U/L   Total Bilirubin 1.1 0.3 - 1.2 mg/dL   GFR calc non Af Amer 35 (L) >60 mL/min   GFR calc Af Amer 41 (L) >60 mL/min    Comment: (NOTE) The eGFR has been calculated using the CKD EPI equation. This calculation has not been validated in all clinical situations. eGFR's persistently <60 mL/min signify possible Chronic Kidney Disease.    Anion gap 10 5 - 15  Magnesium     Status: None   Collection Time: 01/01/17  7:02 PM  Result Value Ref Range   Magnesium 2.0 1.7 - 2.4 mg/dL  TSH     Status: None   Collection Time: 01/01/17  7:02 PM  Result Value Ref Range   TSH 1.980 0.350 - 4.500 uIU/mL    Comment: Performed by a 3rd Generation assay with a functional sensitivity of <=0.01 uIU/mL.  Hemoglobin A1c     Status: Abnormal   Collection Time: 01/01/17  7:02 PM  Result Value Ref Range   Hgb A1c MFr Bld 7.2 (H) 4.8 - 5.6 %    Comment: (NOTE)         Pre-diabetes: 5.7 - 6.4         Diabetes: >6.4         Glycemic control for adults with diabetes: <7.0    Mean Plasma Glucose 160 mg/dL    Comment: (NOTE) Performed At: [facility] [place], [place] [id number] Lindon Romp MD [id number]   Troponin I     Status: None   Collection Time: 01/01/17  7:02 PM  Result Value Ref Range   Troponin I <0.03 <0.03 ng/mL  Glucose, capillary     Status: Abnormal   Collection Time: 01/01/17  9:29 PM  Result Value Ref Range   Glucose-Capillary 111 (H) 65 - 99 mg/dL  Troponin I     Status: None   Collection Time: 01/01/17  11:49 PM  Result Value Ref Range   Troponin I <0.03 <0.03 ng/mL  Troponin I     Status: None   Collection Time: 01/02/17  5:12 AM  Result Value Ref Range   Troponin I <0.03 <0.03 ng/mL  Comprehensive metabolic panel     Status: Abnormal   Collection Time: 01/02/17  5:12 AM  Result Value Ref Range   Sodium 139 135 - 145 mmol/L   Potassium 3.6 3.5 - 5.1 mmol/L   Chloride 106 101 - 111 mmol/L   CO2 22 22 - 32 mmol/L   Glucose, Bld 121 (H) 65 - 99 mg/dL   BUN 19 6 - 20 mg/dL   Creatinine, Ser 1.57 (H) 0.61 - 1.24 mg/dL   Calcium 9.0 8.9 - 10.3 mg/dL   Total Protein 6.5 6.5 - 8.1 g/dL   Albumin 3.3 (L) 3.5 - 5.0 g/dL   AST 20 15 - 41 U/L   ALT 15 (L) 17 - 63 U/L   Alkaline Phosphatase 72 38 - 126 U/L   Total Bilirubin 1.2 0.3 - 1.2 mg/dL   GFR calc non Af Amer 38 (L) >60 mL/min   GFR calc Af Amer 44 (L) >60 mL/min    Comment: (NOTE) The eGFR has been calculated using the CKD EPI equation. This calculation has not been validated in all clinical situations. eGFR's persistently <60 mL/min signify possible Chronic Kidney Disease.    Anion gap 11 5 - 15  CBC     Status: None   Collection Time: 01/02/17  5:12 AM  Result Value  Ref Range   WBC 9.3 4.0 - 10.5 K/uL   RBC 4.38 4.22 - 5.81 MIL/uL   Hemoglobin 14.2 13.0 - 17.0 g/dL   HCT 40.6 39.0 - 52.0 %   MCV 92.7 78.0 - 100.0 fL   MCH 32.4 26.0 - 34.0 pg   MCHC 35.0 30.0 - 36.0 g/dL   RDW 13.3 11.5 - 15.5 %   Platelets 166 150 - 400 K/uL  Glucose, capillary     Status: Abnormal   Collection Time: 01/02/17  8:21 AM  Result Value Ref Range   Glucose-Capillary 113 (H) 65 - 99 mg/dL  Glucose, capillary     Status: Abnormal   Collection Time: 01/02/17 12:00 PM  Result Value Ref Range   Glucose-Capillary 176 (H) 65 - 99 mg/dL  Influenza panel by PCR (type A & B, H1N1)     Status: None   Collection Time: 01/02/17  2:58 PM  Result Value Ref Range   Influenza A By PCR NEGATIVE NEGATIVE   Influenza B By PCR NEGATIVE NEGATIVE     Comment: (NOTE) The Xpert Xpress Flu assay is intended as an aid in the diagnosis of  influenza and should not be used as a sole basis for treatment.  This  assay is FDA approved for nasopharyngeal swab specimens only. Nasal  washings and aspirates are unacceptable for Xpert Xpress Flu testing.   Respiratory Panel by PCR     Status: None   Collection Time: 01/02/17  2:58 PM  Result Value Ref Range   Adenovirus NOT DETECTED NOT DETECTED   Coronavirus 229E NOT DETECTED NOT DETECTED   Coronavirus HKU1 NOT DETECTED NOT DETECTED   Coronavirus NL63 NOT DETECTED NOT DETECTED   Coronavirus OC43 NOT DETECTED NOT DETECTED   Metapneumovirus NOT DETECTED NOT DETECTED   Rhinovirus / Enterovirus NOT DETECTED NOT DETECTED   Influenza A NOT DETECTED NOT DETECTED   Influenza B NOT DETECTED NOT DETECTED   Parainfluenza Virus 1 NOT DETECTED NOT DETECTED   Parainfluenza Virus 2 NOT DETECTED NOT DETECTED   Parainfluenza Virus 3 NOT DETECTED NOT DETECTED   Parainfluenza Virus 4 NOT DETECTED NOT DETECTED   Respiratory Syncytial Virus NOT DETECTED NOT DETECTED   Bordetella pertussis NOT DETECTED NOT DETECTED   Chlamydophila pneumoniae NOT DETECTED NOT DETECTED   Mycoplasma pneumoniae NOT DETECTED NOT DETECTED  Glucose, capillary     Status: Abnormal   Collection Time: 01/02/17  3:21 PM  Result Value Ref Range   Glucose-Capillary 165 (H) 65 - 99 mg/dL   Comment 1 Notify RN    Comment 2 Document in Chart   Glucose, capillary     Status: Abnormal   Collection Time: 01/02/17  6:09 PM  Result Value Ref Range   Glucose-Capillary 110 (H) 65 - 99 mg/dL  Glucose, capillary     Status: Abnormal   Collection Time: 01/02/17  8:59 PM  Result Value Ref Range   Glucose-Capillary 131 (H) 65 - 99 mg/dL  CBC with Differential/Platelet     Status: Abnormal   Collection Time: 01/03/17  5:13 AM  Result Value Ref Range   WBC 10.7 (H) 4.0 - 10.5 K/uL   RBC 4.27 4.22 - 5.81 MIL/uL   Hemoglobin 13.7 13.0 - 17.0 g/dL    HCT 40.7 39.0 - 52.0 %   MCV 95.3 78.0 - 100.0 fL   MCH 32.1 26.0 - 34.0 pg   MCHC 33.7 30.0 - 36.0 g/dL   RDW 13.6 11.5 - 15.5 %  Platelets 168 150 - 400 K/uL   Neutrophils Relative % 69 %   Neutro Abs 7.3 1.7 - 7.7 K/uL   Lymphocytes Relative 19 %   Lymphs Abs 2.0 0.7 - 4.0 K/uL   Monocytes Relative 9 %   Monocytes Absolute 0.9 0.1 - 1.0 K/uL   Eosinophils Relative 3 %   Eosinophils Absolute 0.4 0.0 - 0.7 K/uL   Basophils Relative 0 %   Basophils Absolute 0.0 0.0 - 0.1 K/uL  Basic metabolic panel     Status: Abnormal   Collection Time: 01/03/17  5:13 AM  Result Value Ref Range   Sodium 139 135 - 145 mmol/L   Potassium 3.9 3.5 - 5.1 mmol/L   Chloride 105 101 - 111 mmol/L   CO2 26 22 - 32 mmol/L   Glucose, Bld 134 (H) 65 - 99 mg/dL   BUN 23 (H) 6 - 20 mg/dL   Creatinine, Ser 1.65 (H) 0.61 - 1.24 mg/dL   Calcium 9.0 8.9 - 10.3 mg/dL   GFR calc non Af Amer 35 (L) >60 mL/min   GFR calc Af Amer 41 (L) >60 mL/min    Comment: (NOTE) The eGFR has been calculated using the CKD EPI equation. This calculation has not been validated in all clinical situations. eGFR's persistently <60 mL/min signify possible Chronic Kidney Disease.    Anion gap 8 5 - 15  Glucose, capillary     Status: Abnormal   Collection Time: 01/03/17  7:17 AM  Result Value Ref Range   Glucose-Capillary 126 (H) 65 - 99 mg/dL   [name] Contrast  Result Date: 01/02/2017 CLINICAL DATA:  83 year old male with dementia, atrial fibrillation, diabetes, hypertension, and hyperlipidemia presents with increasing falls, confusion, and weakness. EXAM: MRI HEAD WITHOUT CONTRAST TECHNIQUE: Multiplanar, multiecho pulse sequences of the brain and surrounding structures were obtained without intravenous contrast. COMPARISON:  CT head 01/01/2017. FINDINGS: Brain: Multiple areas of acute infarction are present. In the RIGHT parasagittal frontal lobe, anterior to the genu corpus callosum, there is a 7 x 9 mm region of acute  cortical infarction, RIGHT ACA territory. There is an additional 5 x 10 mm cross-section area of acute infarction, more posteriorly, deep in the interhemispheric fissure, still parasagittal frontal lobe, also RIGHT ACA territory. More posteriorly, spanning the subcortical and periventricular white matter, is is a vertical/linear area of acute infarction favored to represent acute watershed insult, between the anterior/middle and/or middle/posterior cerebral artery vascular territories, as seen on image 43 series 4. No hemorrhage, mass lesion, or extra-axial fluid. Extensive atrophy. Hydrocephalus ex vacuo. Widespread T2 and FLAIR hyperintensities throughout the white matter representing chronic microvascular ischemic change. Vascular: Normal flow voids including proximal RIGHT ACA. Skull and upper cervical spine: Grossly normal marrow signal. No tonsillar herniation. Sinuses/Orbits: No orbital findings of significance. No layering fluid. BILATERAL cataract extraction. Other: None. Compared with recent CT, the infarcts are not visible. IMPRESSION: Multiple RIGHT hemisphere infarcts consistent with RIGHT anterior cerebral artery territory ischemia. More anteriorly, parasagittal cortex involved. More posteriorly, a watershed pattern is seen. Advanced atrophy and small vessel disease. Electronically Signed   By: Staci Righter M.D.   On: 01/02/2017 20:43   Assessment: 83 y.o. male with history of atrial fibrillation presented with left sided weakness and fall. MRI confirmed right ACA territory infarction. Likely etiology of the stroke is atrial fibrillation not on antiplatelet correlation. I discussed with patient in detail regarding anti-coagulation and future risk of strokes. Patient and his wife would like this to  be discussed with Dr. Burt Knack prior to initially antiplatelet correlation. Looking at the size of the stroke that think we need to hold off on starting anti-coagulation for a few days.  Stroke Risk  Factors - atrial fibrillation, diabetes mellitus, family history and hypertension  Plan: 1. HgbA1c, fasting lipid panel 2. Carotid Doppler was ordered 3. PT consult, OT consult, Speech consult 4. Echocardiogram 5. Prophylactic therapy-Antiplatelet med: Aspirin - dose 50m daily 6. Risk factor modification 7. Telemetry monitoring   Stroke education, treatment, and current plan discussed with patient/significant other: Yes.  Also discuss indication when to call 911 specifically discussed about FAST.    01/03/2017, 11:52 AM

## 2017-01-03 NOTE — Care Management Obs Status (Signed)
[place] NOTIFICATION   Patient Details  Name: [name] [name] MRN: [id number] Date of Birth: 16-Jul-1926   Medicare Observation Status Notification Given:  Yes    Dellie Catholic, RN 01/03/2017, 10:36 AM

## 2017-01-04 ENCOUNTER — Inpatient Hospital Stay (HOSPITAL_COMMUNITY): Payer: Medicare Other

## 2017-01-04 DIAGNOSIS — R296 Repeated falls: Secondary | ICD-10-CM | POA: Diagnosis not present

## 2017-01-04 DIAGNOSIS — I482 Chronic atrial fibrillation: Secondary | ICD-10-CM

## 2017-01-04 DIAGNOSIS — R55 Syncope and collapse: Secondary | ICD-10-CM

## 2017-01-04 DIAGNOSIS — I639 Cerebral infarction, unspecified: Secondary | ICD-10-CM | POA: Diagnosis not present

## 2017-01-04 DIAGNOSIS — R2681 Unsteadiness on feet: Secondary | ICD-10-CM | POA: Diagnosis not present

## 2017-01-04 DIAGNOSIS — I8310 Varicose veins of unspecified lower extremity with inflammation: Secondary | ICD-10-CM | POA: Diagnosis not present

## 2017-01-04 DIAGNOSIS — B353 Tinea pedis: Secondary | ICD-10-CM | POA: Diagnosis not present

## 2017-01-04 DIAGNOSIS — E119 Type 2 diabetes mellitus without complications: Secondary | ICD-10-CM

## 2017-01-04 DIAGNOSIS — R41841 Cognitive communication deficit: Secondary | ICD-10-CM | POA: Diagnosis not present

## 2017-01-04 DIAGNOSIS — E872 Acidosis: Secondary | ICD-10-CM | POA: Diagnosis not present

## 2017-01-04 DIAGNOSIS — I63421 Cerebral infarction due to embolism of right anterior cerebral artery: Principal | ICD-10-CM

## 2017-01-04 DIAGNOSIS — M6281 Muscle weakness (generalized): Secondary | ICD-10-CM | POA: Diagnosis not present

## 2017-01-04 DIAGNOSIS — I6340 Cerebral infarction due to embolism of unspecified cerebral artery: Secondary | ICD-10-CM | POA: Insufficient documentation

## 2017-01-04 DIAGNOSIS — J449 Chronic obstructive pulmonary disease, unspecified: Secondary | ICD-10-CM | POA: Diagnosis not present

## 2017-01-04 DIAGNOSIS — I10 Essential (primary) hypertension: Secondary | ICD-10-CM | POA: Diagnosis not present

## 2017-01-04 DIAGNOSIS — R279 Unspecified lack of coordination: Secondary | ICD-10-CM | POA: Diagnosis not present

## 2017-01-04 DIAGNOSIS — E785 Hyperlipidemia, unspecified: Secondary | ICD-10-CM | POA: Diagnosis not present

## 2017-01-04 DIAGNOSIS — R278 Other lack of coordination: Secondary | ICD-10-CM | POA: Diagnosis not present

## 2017-01-04 DIAGNOSIS — F0390 Unspecified dementia without behavioral disturbance: Secondary | ICD-10-CM

## 2017-01-04 DIAGNOSIS — E860 Dehydration: Secondary | ICD-10-CM | POA: Diagnosis not present

## 2017-01-04 DIAGNOSIS — W19XXXD Unspecified fall, subsequent encounter: Secondary | ICD-10-CM | POA: Diagnosis not present

## 2017-01-04 HISTORY — DX: Cerebral infarction due to embolism of unspecified cerebral artery: I63.40

## 2017-01-04 LAB — GLUCOSE, CAPILLARY
GLUCOSE-CAPILLARY: 126 mg/dL — AB (ref 65–99)
GLUCOSE-CAPILLARY: 171 mg/dL — AB (ref 65–99)
Glucose-Capillary: 104 mg/dL — ABNORMAL HIGH (ref 65–99)

## 2017-01-04 LAB — FOLATE RBC
FOLATE, RBC: 1173 ng/mL (ref >498)
Folate, Hemolysate: 514.8 ng/mL
Hematocrit: 43.9 % (ref 37.5–51.0)

## 2017-01-04 LAB — ECHOCARDIOGRAM COMPLETE
HEIGHTINCHES: 67 in
Weight: 3361.57 oz

## 2017-01-04 LAB — HEMOGLOBIN A1C
HEMOGLOBIN A1C: 7.2 % — AB (ref 4.8–5.6)
Mean Plasma Glucose: 160 mg/dL

## 2017-01-04 MED ORDER — GUAIFENESIN ER 600 MG PO TB12: 600.0000 mg | ORAL_TABLET | Freq: Two times a day (BID) | ORAL | 0 refills | Status: DC

## 2017-01-04 MED ORDER — ATORVASTATIN CALCIUM 20 MG PO TABS
20.0000 mg | ORAL_TABLET | Freq: Every day | ORAL | Status: DC
  Administered 2017-01-04: 20 mg via ORAL
  Filled 2017-01-04: qty 1

## 2017-01-04 MED ORDER — FUROSEMIDE 20 MG PO TABS: 20.0000 mg | ORAL_TABLET | ORAL | 3 refills | Status: DC

## 2017-01-04 MED ORDER — ASPIRIN 325 MG PO TABS: 325.0000 mg | ORAL_TABLET | Freq: Every day | ORAL | 0 refills | Status: DC

## 2017-01-04 MED ORDER — PERFLUTREN LIPID MICROSPHERE
1.0000 mL | INTRAVENOUS | Status: AC | PRN
  Administered 2017-01-04: 2 mL via INTRAVENOUS
  Filled 2017-01-04: qty 10

## 2017-01-04 MED ORDER — ATORVASTATIN CALCIUM 20 MG PO TABS: 20.0000 mg | ORAL_TABLET | Freq: Every day | ORAL | 0 refills | Status: DC

## 2017-01-04 MED ORDER — INSULIN ASPART 100 UNIT/ML ~~LOC~~ SOLN: SUBCUTANEOUS | 0 refills | Status: DC

## 2017-01-04 NOTE — Discharge Summary (Signed)
Discharge Summary  [name] [name] [id number] DOB: July 12, 1926  PCP: Haywood Pao, MD  Admit date: 01/01/2017 Discharge date: 01/04/2017  Time spent: >25mins  Recommendations for Outpatient Follow-up:  1. F/u with PMD within a week  for hospital discharge follow up, repeat cbc/bmp at follow up 2. F/u with neurology /stroke clinic Dr Rosalin Hawking 3. F/u with cardiology Dr cooper  Discharge Diagnoses:  Active Hospital Problems   Diagnosis Date Noted  . Frequent falls 01/01/2017  . Falls 01/03/2017  . Generalized weakness 01/01/2017  . COPD (chronic obstructive pulmonary disease) with chronic bronchitis ([place]) 03/12/2016  . Diabetes mellitus without complication ([place]) [id number]  . Hyperlipidemia 11/27/2008  . HYPERTENSION, BENIGN 11/27/2008  . GERD 11/27/2008    Resolved Hospital Problems   Diagnosis Date Noted Date Resolved  No resolved problems to display.    Discharge Condition: stable  Diet recommendation: heart healthy/carb modified  Filed Weights   01/01/17 1810 01/01/17 2131 01/02/17 2101  Weight: 93.2 kg (205 lb 8 oz) 94.6 kg (208 lb 9.6 oz) 95.3 kg (210 lb 1.6 oz)    History of present illness:  PCP: Haywood Pao, MD  Patient coming from: Home  Chief Complaint: increased frequency of falls, weakness and confusion  HPI: [name] [name] is a 83 y.o. male with medical history significant of chronic atrial fibrillation, not on any anticoagulation therapy due to frequent falls, diastolic CHF with EF 0000000 by last echo in February 2017, dementia, hypertension hyperlipidemia, CKD, COPD, diabetes mellitus who presented to the emergency department with complaints of a falls increasing in frequency, confusion and weakness. He has a history of falls and has been falling usually once or twice a month, but last night he felt three times. HIs son and wife assisted the patient to get off the floor and after the third fall his wife called the non-emergency  medical service for assistance and they recommended bringing him to the ED for evaluation He denied CP, SOB, dizziness, lightheadedness, palpitations and repeatedly said that he is clumsy, weak and at times is confused with memory gaps. Wife reports he is possibly slightly more confused than at baseline than usual; reports he takes medication for dementia. He also c/o urinary incontinence. This morning patient was unable to stand and ambulate few feet using walker d/t progressive weakness.   ED Course: VS on presentation were stable, Head CT negative  for acute findings, chest XRay did not show any acute cardiopulmonary disease.    EKG showed rate controlled AFib without acute ST-TW changes Blood work elevated white blood cells count of 17,200, elevated BUN - 20 and creatinine - 1.82 (the baseline creatinine is 1.54), lactic acid - 2.87, patient was given two boluses of NS 500 cc in the ED and repeat Lactic acid is pending.  Urinalysis was negative for signs of infection.   Hospital Course:  Principal Problem:   Frequent falls Active Problems:   Hyperlipidemia   HYPERTENSION, BENIGN   GERD   Diabetes mellitus without complication (HCC)   COPD (chronic obstructive pulmonary disease) with chronic bronchitis (HCC)   Generalized weakness   Falls   Acute right ACA nonhemorrhagic stroke: CT head no acute findings on admission, Due to left sided weakness,  MRI brain obtained and showed acute cva Neurology consulted, input appreciated. Patient was taken off anticoagulation a while ago due to not taking it per family, family requests to see cardiology for recommendation of anticoagulation, per cardiology no anticoagulation. He is discharged on asa  325, statin, neuro follow up    Multiple falls/progressive weakness: He has a history of falls and has been falling usually once or twice a month, but the night prior to coming to the hospital , he felt three times orthostatic vital signs not  able to be completed due to patient is not able to stand up He is found to have acute right ACA stroke PT/OT, fall precaution, snf placement   congested cough,  flu pcr and respiratory viral panel negative, awaiting sputum sample to be collected Troponin negative x3, ekg chronic afib, cxr no acute findings Per family this is chronic, continue mucinex   Leukocytosis/lactic acidosis presented on admission,  this has resolved with gentle hydration, he is off ivf.  ua unremarkable, cxr no acute findings.   home meds lasix held in the hospital, resumed at decreased frequency (from 20mg  daily to 20mg  MWF)  HTN; continue toprol  Chronic afib/diastolic chf: rate controlled on toprol, he was taken off anticoagulation a while ago. Cardiology consulted while he is in the hospital, do not recommend anticoagulation, he is discharged on asa 325 mg daily, lasix dose adjusted He is to follow up with cardiology Dr Burt Knack on 1/24  Diabetes, previously insulin dependent (per discharge summary a year ago, no insulin or oral diabetes meds on home meds list this admission, per wife there is no change of home meds recently) A1c7.2, Started  ssi   CKD III, cr close to baseline He did has mild azotemia on presentation this has resolved with gentle hydration. Lasix dose reduced at discharge  BPH on proscar  Dementia: on aricept, pleasantly demented at baseline   Code Status: full  Family Communication: patient and wife at bedside, daughter vickie  over the phone  Disposition Plan: SNF placement   Consultants:  Social worker  Neurology  cardiology  Procedures:  none  Antibiotics:  none   Discharge Exam: BP 106/68 (BP Location: Right Arm)   Pulse 76   Temp 97.7 F (36.5 C) (Oral)   Resp 16   Ht 5\' 7"  (1.702 m)   Wt 95.3 kg (210 lb 1.6 oz)   SpO2 97%   BMI 32.91 kg/m     General:  Very frail, pleasantly demented, but NAD  Cardiovascular:  IRRR  Respiratory: diminished, no rhonchi, no wheezing, no rales  Abdomen: Soft/ND/NT, positive BS  Musculoskeletal: No Edema  Neuro: baseline dementia, left sided subtle weakness    Discharge Instructions You were cared for by a hospitalist during your hospital stay. If you have any questions about your discharge medications or the care you received while you were in the hospital after you are discharged, you can call the unit and asked to speak with the hospitalist on call if the hospitalist that took care of you is not available. Once you are discharged, your primary care physician will handle any further medical issues. Please note that NO REFILLS for any discharge medications will be authorized once you are discharged, as it is imperative that you return to your primary care physician (or establish a relationship with a primary care physician if you do not have one) for your aftercare needs so that they can reassess your need for medications and monitor your lab values.  Discharge Instructions    Ambulatory referral to Neurology    Complete by:  As directed    An appointment is requested in approximately: 4 weeks Stroke clinic   Diet - low sodium heart healthy    Complete by:  As directed    Carb modified   Increase activity slowly    Complete by:  As directed      Allergies as of 01/04/2017      Reactions   Sulfa Antibiotics Swelling   hands      Medication List    STOP taking these medications   oxybutynin 5 MG 24 hr tablet Commonly known as:  DITROPAN-XL     TAKE these medications   aspirin 325 MG tablet Take 1 tablet (325 mg total) by mouth daily. Start taking on:  01/05/2017   atorvastatin 20 MG tablet Commonly known as:  LIPITOR Take 1 tablet (20 mg total) by mouth daily at 6 PM.   donepezil 5 MG tablet Commonly known as:  ARICEPT Take 5 mg by mouth daily.   finasteride 5 MG tablet Commonly known as:  PROSCAR Take 5 mg by mouth daily.   furosemide 20  MG tablet Commonly known as:  LASIX Take 1 tablet (20 mg total) by mouth every Monday, Wednesday, and Friday. What changed:  when to take this   guaiFENesin 600 MG 12 hr tablet Commonly known as:  MUCINEX Take 1 tablet (600 mg total) by mouth 2 (two) times daily.   insulin aspart 100 UNIT/ML injection Commonly known as:  novoLOG Before each meal 3 times a day, 140-199 - 2 units, 200-250 - 4 units, 251-299 - 6 units,  300-349 - 8 units,  350 or above 10 units. Insulin PEN if approved, provide syringes and needles if needed.   metoprolol succinate 50 MG 24 hr tablet Commonly known as:  TOPROL-XL TAKE 1 TABLET (50 MG TOTAL) BY MOUTH AT BEDTIME.   multivitamin with minerals Tabs tablet Take 1 tablet by mouth daily.      Allergies  Allergen Reactions  . Sulfa Antibiotics Swelling    hands   Follow-up Information    Sherren Mocha, MD Follow up on 01/13/2017.   Specialty:  Cardiology Why:  afib Contact information: [id number] [address] [phone]        Haywood Pao, MD Follow up in 1 week(s).   Specialty:  Internal Medicine Why:  hospital discharge follow up Contact information: [address] [place] [address] [phone]            The results of significant diagnostics from this hospitalization (including imaging, microbiology, ancillary and laboratory) are listed below for reference.    Significant Diagnostic Studies: Dg Chest 2 View  Result Date: 01/01/2017 CLINICAL DATA:  Fall EXAM: CHEST  2 VIEW COMPARISON:  03/12/2016 FINDINGS: Normal heart size. Lungs are under aerated and grossly clear. Interstitial prominence is stable. No pneumothorax or pleural effusion. IMPRESSION: No active cardiopulmonary disease. Electronically Signed   By: Marybelle Killings M.D.   On: 01/01/2017 09:07   Ct Head Wo Contrast  Result Date: 01/01/2017 CLINICAL DATA:  Multiple falls out of bed this morning. EXAM: CT HEAD WITHOUT CONTRAST  TECHNIQUE: Contiguous axial images were obtained from the base of the skull through the vertex without intravenous contrast. COMPARISON:  01/28/2016 FINDINGS: Brain: There is diffuse moderately severe cerebral cortical atrophy and cerebellar atrophy with secondary ventricular dilatation and extensive periventricular white matter disease. The findings are stable. There is no acute hemorrhage, acute infarction, or intracranial mass lesion. Vascular: No hyperdense vessel or unexpected calcification. Skull: Normal. Negative for fracture or focal lesion. Sinuses/Orbits: Normal. Other: None. IMPRESSION: 1. No acute intracranial abnormality. 2. Chronic atrophy with chronic small  vessel ischemic changes. Electronically Signed   By: Lorriane Shire M.D.   On: 01/01/2017 09:25   [name] Contrast  Result Date: 01/02/2017 CLINICAL DATA:  83 year old male with dementia, atrial fibrillation, diabetes, hypertension, and hyperlipidemia presents with increasing falls, confusion, and weakness. EXAM: MRI HEAD WITHOUT CONTRAST TECHNIQUE: Multiplanar, multiecho pulse sequences of the brain and surrounding structures were obtained without intravenous contrast. COMPARISON:  CT head 01/01/2017. FINDINGS: Brain: Multiple areas of acute infarction are present. In the RIGHT parasagittal frontal lobe, anterior to the genu corpus callosum, there is a 7 x 9 mm region of acute cortical infarction, RIGHT ACA territory. There is an additional 5 x 10 mm cross-section area of acute infarction, more posteriorly, deep in the interhemispheric fissure, still parasagittal frontal lobe, also RIGHT ACA territory. More posteriorly, spanning the subcortical and periventricular white matter, is is a vertical/linear area of acute infarction favored to represent acute watershed insult, between the anterior/middle and/or middle/posterior cerebral artery vascular territories, as seen on image 43 series 4. No hemorrhage, mass lesion, or extra-axial fluid.  Extensive atrophy. Hydrocephalus ex vacuo. Widespread T2 and FLAIR hyperintensities throughout the white matter representing chronic microvascular ischemic change. Vascular: Normal flow voids including proximal RIGHT ACA. Skull and upper cervical spine: Grossly normal marrow signal. No tonsillar herniation. Sinuses/Orbits: No orbital findings of significance. No layering fluid. BILATERAL cataract extraction. Other: None. Compared with recent CT, the infarcts are not visible. IMPRESSION: Multiple RIGHT hemisphere infarcts consistent with RIGHT anterior cerebral artery territory ischemia. More anteriorly, parasagittal cortex involved. More posteriorly, a watershed pattern is seen. Advanced atrophy and small vessel disease. Electronically Signed   By: Staci Righter M.D.   On: 01/02/2017 20:43    Microbiology: Recent Results (from the past 240 hour(s))  Respiratory Panel by PCR     Status: None   Collection Time: 01/02/17  2:58 PM  Result Value Ref Range Status   Adenovirus NOT DETECTED NOT DETECTED Final   Coronavirus 229E NOT DETECTED NOT DETECTED Final   Coronavirus HKU1 NOT DETECTED NOT DETECTED Final   Coronavirus NL63 NOT DETECTED NOT DETECTED Final   Coronavirus OC43 NOT DETECTED NOT DETECTED Final   Metapneumovirus NOT DETECTED NOT DETECTED Final   Rhinovirus / Enterovirus NOT DETECTED NOT DETECTED Final   Influenza A NOT DETECTED NOT DETECTED Final   Influenza B NOT DETECTED NOT DETECTED Final   Parainfluenza Virus 1 NOT DETECTED NOT DETECTED Final   Parainfluenza Virus 2 NOT DETECTED NOT DETECTED Final   Parainfluenza Virus 3 NOT DETECTED NOT DETECTED Final   Parainfluenza Virus 4 NOT DETECTED NOT DETECTED Final   Respiratory Syncytial Virus NOT DETECTED NOT DETECTED Final   Bordetella pertussis NOT DETECTED NOT DETECTED Final   Chlamydophila pneumoniae NOT DETECTED NOT DETECTED Final   Mycoplasma pneumoniae NOT DETECTED NOT DETECTED Final     Labs: Basic Metabolic Panel:  Recent  Labs Lab 01/01/17 0810 01/01/17 1429 01/01/17 1902 01/02/17 0512 01/03/17 0513  NA 137  --  137 139 139  K 4.1  --  3.6 3.6 3.9  CL 101  --  104 106 105  CO2 20*  --  23 22 26   GLUCOSE 172*  --  117* 121* 134*  BUN 25*  --  21* 19 23*  CREATININE 1.82*  --  1.66* 1.57* 1.65*  CALCIUM 9.8  --  9.2 9.0 9.0  MG  --  2.1 2.0  --   --    Liver Function Tests:  Recent Labs  Lab 01/01/17 0810 01/01/17 1902 01/02/17 0512  AST 26 22 20   ALT 19 16* 15*  ALKPHOS 82 76 72  BILITOT 1.0 1.1 1.2  PROT 7.9 6.8 6.5  ALBUMIN 4.1 3.6 3.3*   No results for input(s): LIPASE, AMYLASE in the last 168 hours. No results for input(s): AMMONIA in the last 168 hours. CBC:  Recent Labs Lab 01/01/17 0810 01/01/17 1429 01/02/17 0512 01/03/17 0513  WBC 17.2*  --  9.3 10.7*  NEUTROABS 15.2*  --   --  7.3  HGB 16.0  --  14.2 13.7  HCT 45.5 43.9 40.6 40.7  MCV 92.7  --  92.7 95.3  PLT 179  --  166 168   Cardiac Enzymes:  Recent Labs Lab 01/01/17 1902 01/01/17 2349 01/02/17 0512  TROPONINI <0.03 <0.03 <0.03   BNP: BNP (last 3 results)  Recent Labs  02/26/16 1359  BNP 171.0*    ProBNP (last 3 results) No results for input(s): PROBNP in the last 8760 hours.  CBG:  Recent Labs Lab 01/03/17 1154 01/03/17 1630 01/03/17 2143 01/04/17 0800 01/04/17 1152  GLUCAP 142* 150* 175* 126* 171*       Signed:  Lunette Tapp MD, PhD  [facility] 01/04/2017, 4:17 PM

## 2017-01-04 NOTE — Progress Notes (Signed)
STROKE TEAM PROGRESS NOTE   HISTORY OF PRESENT ILLNESS (per record) [name] [name] is an 83 y.o. male with previous history of atrial fibrillation and was taken off of anticoagulations about a year ago due to noncompliance. Friday night patient had approximately 3 falls and after the third fall wife decided to bring him to the emergency room. She reports she noted some left-sided weakness and difficulty ambulating at that time. Patient is a poor historian with baseline dementia. He is very pleasant and attempts to provide history. He tells me he came into the hospital because of passing out. He denied any facial droop, changes in vision, numbness, problems swallowing, problems with bilateral bladder control. He does not take any aspirin at home currently. Patient had an MRI of the brain as part of the workup for falls which showed right ACA territory stroke. He has no known history of stroke. He was last known well 01/09/2017, time unknown. Patient was not administered IV t-PA secondary to being outside the treatment window. He was admitted for further evaluation and treatment.   SUBJECTIVE (INTERVAL HISTORY) No family at bedside. Patient lying in bed, pleasant and not in distress. He is asking for food. MRI showed right ACA stroke. Patient does have mild left arm and leg weakness. Cardiology did not recommend antipronation due to frequent fall, and dementia. Family agree with cardiology decision as per primary team.   OBJECTIVE Temp:  [97.4 F (36.3 C)-98.5 F (36.9 C)] 97.7 F (36.5 C) (01/15 1015) Pulse Rate:  [62-76] 76 (01/15 1015) Cardiac Rhythm: Atrial fibrillation (01/15 0700) Resp:  [16-20] 16 (01/15 1015) BP: (106-130)/(64-75) 106/68 (01/15 1015) SpO2:  [94 %-98 %] 97 % (01/15 1015)  CBC:  Recent Labs Lab 01/01/17 0810 01/02/17 0512 01/03/17 0513  WBC 17.2* 9.3 10.7*  NEUTROABS 15.2*  --  7.3  HGB 16.0 14.2 13.7  HCT 45.5 40.6 40.7  MCV 92.7 92.7 95.3  PLT 179 166 168     Basic Metabolic Panel:  Recent Labs Lab 01/01/17 1429 01/01/17 1902 01/02/17 0512 01/03/17 0513  NA  --  137 139 139  K  --  3.6 3.6 3.9  CL  --  104 106 105  CO2  --  23 22 26   GLUCOSE  --  117* 121* 134*  BUN  --  21* 19 23*  CREATININE  --  1.66* 1.57* 1.65*  CALCIUM  --  9.2 9.0 9.0  MG 2.1 2.0  --   --     Lipid Panel:    Component Value Date/Time   CHOL 143 01/03/2017 1206   TRIG 64 01/03/2017 1206   HDL 32 (L) 01/03/2017 1206   CHOLHDL 4.5 01/03/2017 1206   VLDL 13 01/03/2017 1206   [place] 98 01/03/2017 1206   HgbA1c:  Lab Results  Component Value Date   HGBA1C 7.2 (H) 01/03/2017   Urine Drug Screen: No results found for: LABOPIA, COCAINSCRNUR, LABBENZ, AMPHETMU, THCU, LABBARB    IMAGING I have personally reviewed the radiological images below and agree with the radiology interpretations.  [name] Contrast 01/02/2017 Multiple RIGHT hemisphere infarcts consistent with RIGHT anterior cerebral artery territory ischemia. More anteriorly, parasagittal cortex involved. More posteriorly, a watershed pattern is seen. Advanced atrophy and small vessel disease.   2D Echocardiogram  - Left ventricle: The cavity size was normal. Wall thickness was increased in a pattern of mild LVH. Systolic function was normal. The estimated ejection fraction was in the range of 60% to 65%. -  Aortic valve: There was mild regurgitation. - Mitral valve: There was mild regurgitation. - Left atrium: The atrium was mildly dilated. - Right ventricle: The cavity size was mildly dilated. - Right atrium: The atrium was mildly dilated.  Carotid Doppler   Bilateral: 1-39% ICA stenosis. Vertebral artery flow is antegrade.   PHYSICAL EXAM  Temp:  [97.7 F (36.5 C)-98.5 F (36.9 C)] 98.3 F (36.8 C) (01/15 1705) Pulse Rate:  [62-82] 82 (01/15 1705) Resp:  [16-20] 16 (01/15 1705) BP: (106-131)/(60-75) 131/60 (01/15 1705) SpO2:  [94 %-97 %] 95 % (01/15 1705)  General - Well  nourished, well developed, in no apparent distress.  Ophthalmologic - Fundi not visualized due to noncooperation.  Cardiovascular - irregularly irregular heart rate and rhythm.  Mental Status -  Level of arousal and orientation to year and person were intact, however not orientated to month and place. Language including expression, naming, repetition, comprehension was assessed and found intact, however mild dysarthria.  Cranial Nerves II - XII - II - Visual field intact OU. III, IV, VI - Extraocular movements intact. V - Facial sensation intact bilaterally. VII - Facial movement intact bilaterally. VIII - Hearing & vestibular intact bilaterally. X - Palate elevates symmetrically. XI - Chin turning & shoulder shrug intact bilaterally. XII - Tongue protrusion intact.  Motor Strength - The patient's strength was 5-/5 RUE and RLE, 4/5 LUE and LLE.  Bulk was normal and fasciculations were absent.   Motor Tone - Muscle tone was assessed at the neck and appendages and was normal.  Reflexes - The patient's reflexes were 1+ in all extremities and he had no pathological reflexes.  Sensory - Light touch, temperature/pinprick were assessed and were symmetrical.    Coordination - The patient had normal movements in the hands with no ataxia or dysmetria, however slow in action.  Tremor was absent.  Gait and Station - deferred   ASSESSMENT/PLAN Mr. [name] [name] is a 83 y.o. male with history of atrial fibrillation with frequent falls, diastolic heart failure, CAD, COPD presenting with increased weakness and confusion after 3 falls last night. He did not receive IV t-PA due to delay in arrival.   Stroke:  Right ACA infarct, embolic secondary to known atrial fibrillation off anticoagulation due to increased falls   MRI  right ACA infarct  MRA  not done as it will not change management  Carotid Doppler  unremarkable  2D Echo  EF 60-65%. No source of embolus   LDL 98  HgbA1c  7.2  Heparin 5000 units sq tid for VTE prophylaxis  Diet Heart Room service appropriate? Yes; Fluid consistency: Thin  Diet heart healthy/carb modified Room service appropriate? Yes; Fluid consistency: Thin  No antithrombotic prior to admission, now on aspirin 325 mg daily. Cardiology recommended no anticoagulation due to frequent falls. Family agree with the decision as per primary team  Patient counseled to be compliant with his antithrombotic medications  Ongoing aggressive stroke risk factor management  Therapy recommendations:  Pending  Disposition:  Pending  Atrial Fibrillation  Home anticoagulation:  none. Taken off anticoagulation one year ago due to recurrent falls and dementia  Cardiology on board, recommend no antiplatelet agent  Family agree with the decision as per primary team    Hypertension  Stable  Permissive hypertension (OK if < 220/120) but gradually normalize in 5-7 days  Long-term BP goal normotensive  Hyperlipidemia  Home meds:  No statin  LDL 98, goal < 70  Now on lipitor 20  Continue statin at discharge  Diabetes  HgbA1c 7.2, goal < 7.0  Uncontrolled  SSI  Close PCP follow-up  Other Stroke Risk Factors  Advanced age  ETOH use, advised to drink no more than 2 drink(s) a day  Obesity, Body mass index is 32.91 kg/m., recommend weight loss, diet and exercise as appropriate   Obstructive sleep apnea, not on CPAP at home  Other Active Problems  Falls increasing in frequency  Generalized weakness  Baseline dementia, appears to be worsening  Hospital day # 1  Neurology will sign off. Please call with questions. Pt will follow up with Cecille Rubin NP at [facility] in about 6 weeks. Thanks for the consult.  Rosalin Hawking, MD PhD Stroke Neurology 01/04/2017 6:58 PM   To contact Stroke Continuity provider, please refer to [email or web address] After hours, contact General Neurology

## 2017-01-04 NOTE — Consult Note (Signed)
CARDIOLOGY CONSULT NOTE   Patient ID: [name] [name] MRN: [id number] DOB/AGE: [birth year] 81 y.o.  Admit date: 01/01/2017  Requesting Physician: Dr. Erlinda Hong / Dr. Alver Fisher  Primary Physician:   [name] Pao, MD Primary Cardiologist: Dr. Burt Knack Reason for Consultation:  Question on [facility]   HPI: [name] [name] is a 83 y.o. male with a history of chronic atrial fibrillation (not on [facility] given gait unsteadiness and non compliance), chronic diastolic CHF, CKD, COPD, DMT2, HTN, HLD, dementia and obesity who presented to [facility] on 01/01/17 with increasing falls, weakness and confusion. MRI showed acute CVA and cardiology asked to weigh in about anticoagulation.   He was last seen by Dr. Burt Knack in 05/2016. He was started on lasix. He was felt to not be a candidate for chronic anticoagulation given gait unsteadiness and non compliance.He was continued on ASA 325mg  daily.    He has a history of falls, dalling 1-2 x a month. He walks with a walker. On the night before presentation, he fell 3x. EMS was called and he was brought to the ER. He denied CP, SOB, dizziness, lightheadedness, palpitations and repeatedly said that he is clumsy, weak and at times is confused with memory gaps.   In the ER head CT negative  for acute findings, chest XRay did not show any acute cardiopulmonary disease and EKG showed rate controlled AFib without acute ST-TW changes. Blood work showed elevated WBC of 17,200, elevated BUN - 20 and creatinine - 1.82 (the baseline creatinine is 1.54), lactic acid - 2.87.  Due to continued left sided weakness, MRI was ordered. This showed a right ACA territory stroke. Neurology recommended oral anticoagulation. 2D ECHO showed normal LV function with mild AR/MR.   Patient is pleasantly demented making history difficult. No CP or SOB. No LE edema, orthopnea or PND. No dizziness or syncope. No blood in stool or urine. No palpitations.  His biggest complaint is that he is hungry.   Past  Medical History:  Diagnosis Date  . Arthritis   . Chronic atrial fibrillation ([place])   . Diabetes mellitus without complication ([place])   . Hyperlipidemia   . Hypertension   . Sleep apnea    no cpap used, sleep study was several yrs ago  . Urinary retention foley last changed 3 weeks ago     Past Surgical History:  Procedure Laterality Date  . BACK SURGERY  yrs ago   lower back surgery  . PROSTATE SURGERY  yrs ago, no cancer  . TONSILLECTOMY  as child  . TRANSURETHRAL RESECTION OF PROSTATE  02/22/2012   Procedure: TRANSURETHRAL RESECTION OF THE PROSTATE WITH GYRUS INSTRUMENTS;  Surgeon: Molli Hazard, MD;  Location: [facility] ORS;  Service: Urology;  Laterality: N/A;        Allergies  Allergen Reactions  . Sulfa Antibiotics Swelling    hands    I have reviewed the patient's current medications . aspirin  325 mg Oral Daily  . atorvastatin  20 mg Oral q1800  . docusate sodium  100 mg Oral BID  . donepezil  5 mg Oral Daily  . finasteride  5 mg Oral Daily  . guaiFENesin  600 mg Oral BID  . heparin  5,000 Units Subcutaneous Q8H  . insulin aspart  0-9 Units Subcutaneous TID WC  . metoprolol succinate  50 mg Oral QHS  . oxybutynin  5 mg Oral Daily    acetaminophen **OR** acetaminophen, perflutren lipid microspheres (DEFINITY) IV suspension, traMADol  Prior to Admission medications   Medication Sig Start Date End Date Taking? Authorizing Provider  donepezil (ARICEPT) 5 MG tablet Take 5 mg by mouth daily. 01/09/16  Yes Historical Provider, MD  finasteride (PROSCAR) 5 MG tablet Take 5 mg by mouth daily.   Yes Historical Provider, MD  furosemide (LASIX) 20 MG tablet Take 1 tablet (20 mg total) by mouth daily. 05/25/16  Yes [name] Mocha, MD  metoprolol succinate (TOPROL-XL) 50 MG 24 hr tablet TAKE 1 TABLET (50 MG TOTAL) BY MOUTH AT BEDTIME. 10/16/16  Yes [name] Mocha, MD  Multiple Vitamin (MULTIVITAMIN WITH MINERALS) TABS tablet Take 1 tablet by mouth daily.   Yes Historical  Provider, MD  oxybutynin (DITROPAN-XL) 5 MG 24 hr tablet Take 5 mg by mouth daily. 04/16/16  Yes Historical Provider, MD     Social History   Social History  . Marital status: Married    Spouse name: N/A  . Number of children: N/A  . Years of education: N/A   Occupational History  . Not on file.   Social History Main Topics  . Smoking status: Former Smoker    Packs/day: 0.50    Years: 50.00    Types: Cigarettes    Quit date: 12/21/1980  . Smokeless tobacco: Never Used  . Alcohol use 0.0 oz/week     Comment: rare use  . Drug use: No  . Sexual activity: No   Other Topics Concern  . Not on file   Social History Narrative  . No narrative on file    Family Status  Relation Status  . Mother Deceased  . Father Deceased  . Sister Deceased  . Brother Deceased   Family History  Problem Relation Age of Onset  . Heart attack Father     ROS:  Full 14 point review of systems complete and found to be negative unless listed above.  Physical Exam: Blood pressure 106/68, pulse 76, temperature 97.7 F (36.5 C), temperature source Oral, resp. rate 16, height 5\' 7"  (1.702 m), weight 210 lb 1.6 oz (95.3 kg), SpO2 97 %.  General: Well developed, well nourished, male in no acute distress Head: Eyes PERRLA, No xanthomas.   Normocephalic and atraumatic, oropharynx without edema or exudate. Lungs: CTAB Heart: HRRR S1 S2, no rub/gallop, Heart regular rate and rhythm with S1, S2 no murmur. pulses are 2+ extrem.   Neck: No carotid bruits. No lymphadenopathy. No JVD. Abdomen: Bowel sounds present, abdomen soft and non-tender without masses or hernias noted. Msk:  No spine or cva tenderness. No weakness, no joint deformities or effusions. Extremities: No clubbing or cyanosis. Trace LE edema.  Neuro: Alert and oriented X 3. No focal deficits noted. Psych:  Good affect, responds appropriately, pleasantly demented  Skin: No rashes or lesions noted.  Labs:   Lab Results  Component Value  Date   WBC 10.7 (H) 01/03/2017   HGB 13.7 01/03/2017   HCT 40.7 01/03/2017   MCV 95.3 01/03/2017   PLT 168 01/03/2017   No results for input(s): INR in the last 72 hours.  Recent Labs Lab 01/02/17 0512 01/03/17 0513  NA 139 139  K 3.6 3.9  CL 106 105  CO2 22 26  BUN 19 23*  CREATININE 1.57* 1.65*  CALCIUM 9.0 9.0  PROT 6.5  --   BILITOT 1.2  --   ALKPHOS 72  --   ALT 15*  --   AST 20  --   GLUCOSE 121* 134*  ALBUMIN 3.3*  --  Magnesium  Date Value Ref [name] Status  01/01/2017 2.0 1.7 - 2.4 mg/dL Final    Recent Labs  01/01/17 1902 01/01/17 2349 01/02/17 0512  TROPONINI <0.03 <0.03 <0.03    Recent Labs  01/01/17 1434  TROPIPOC 0.00   Pro B Natriuretic peptide (BNP)  Date/Time Value Ref [name] Status  12/28/2012 12:56 PM 50.0 0.0 - 100.0 pg/mL Final   Lab Results  Component Value Date   CHOL 143 01/03/2017   HDL 32 (L) 01/03/2017   LDLCALC 98 01/03/2017   TRIG 64 01/03/2017   No results found for: DDIMER No results found for: LIPASE, AMYLASE TSH  Date/Time Value Ref [name] Status  01/01/2017 07:02 PM 1.980 0.350 - 4.500 uIU/mL Final    Comment:    Performed by a 3rd Generation assay with a functional sensitivity of <=0.01 uIU/mL.   Vitamin B-12  Date/Time Value Ref [name] Status  01/01/2017 02:29 PM 624 180 - 914 pg/mL Final    Comment:    (NOTE) This assay is not validated for testing neonatal or myeloproliferative syndrome specimens for Vitamin B12 levels.     Echo: 01/04/2017 LV EF: 60% -   65% Study Conclusions - Left ventricle: The cavity size was normal. Wall thickness was   increased in a pattern of mild LVH. Systolic function was normal.   The estimated ejection fraction was in the [name] of 60% to 65%. - Aortic valve: There was mild regurgitation. - Mitral valve: There was mild regurgitation. - Left atrium: The atrium was mildly dilated. - Right ventricle: The cavity size was mildly dilated. - Right atrium: The atrium was mildly  dilated.  ECG:  Chronic atrial fibrillation HR 87  Radiology:  [name] Contrast  Result Date: 01/02/2017 CLINICAL DATA:  83 year old male with dementia, atrial fibrillation, diabetes, hypertension, and hyperlipidemia presents with increasing falls, confusion, and weakness. EXAM: MRI HEAD WITHOUT CONTRAST TECHNIQUE: Multiplanar, multiecho pulse sequences of the brain and surrounding structures were obtained without intravenous contrast. COMPARISON:  CT head 01/01/2017. FINDINGS: Brain: Multiple areas of acute infarction are present. In the RIGHT parasagittal frontal lobe, anterior to the genu corpus callosum, there is a 7 x 9 mm region of acute cortical infarction, RIGHT ACA territory. There is an additional 5 x 10 mm cross-section area of acute infarction, more posteriorly, deep in the interhemispheric fissure, still parasagittal frontal lobe, also RIGHT ACA territory. More posteriorly, spanning the subcortical and periventricular white matter, is is a vertical/linear area of acute infarction favored to represent acute watershed insult, between the anterior/middle and/or middle/posterior cerebral artery vascular territories, as seen on image 43 series 4. No hemorrhage, mass lesion, or extra-axial fluid. Extensive atrophy. Hydrocephalus ex vacuo. Widespread T2 and FLAIR hyperintensities throughout the white matter representing chronic microvascular ischemic change. Vascular: Normal flow voids including proximal RIGHT ACA. Skull and upper cervical spine: Grossly normal marrow signal. No tonsillar herniation. Sinuses/Orbits: No orbital findings of significance. No layering fluid. BILATERAL cataract extraction. Other: None. Compared with recent CT, the infarcts are not visible. IMPRESSION: Multiple RIGHT hemisphere infarcts consistent with RIGHT anterior cerebral artery territory ischemia. More anteriorly, parasagittal cortex involved. More posteriorly, a watershed pattern is seen. Advanced atrophy and small  vessel disease. Electronically Signed   By: Staci Righter M.D.   On: 01/02/2017 20:43    ASSESSMENT AND PLAN:    Principal Problem:   Frequent falls Active Problems:   Hyperlipidemia   HYPERTENSION, BENIGN   GERD   Diabetes mellitus without complication ([place])  COPD (chronic obstructive pulmonary disease) with chronic bronchitis (HCC)   Generalized weakness   Falls   [name] [name] is a 83 y.o. male with a history of chronic atrial fibrillation (not on [place] given gait unsteadiness and non compliance), chronic diastolic CHF, CKD, COPD, DMT2, HTN, HLD, dementia and obesity who presented to [facility] on 01/01/17 with increasing falls, weakness and confusion. MRI showed acute CVA and cardiology asked to weigh in about anticoagulation.   Acute CVA: MRI shows right ACA territory stroke. Neurology has seen who feels etiology likely cardio embolic. Neuro recommending [place] for secondary CVA prevention. See below.   Chronic atrial fibrillation: with slow ventricular response (HRs in 40s). Will decrease Toprol XL from 50mg  --> 25mg  daily. CHADSVASC score at least 7 (CHF,HTN, age, DM, CVA). With his dementia, unsteadiness and frequent falls, I am not sure the benefits of [place] outweigh the risks. Will defer to Dr. Meda Coffee.   Chronic diastolic CHF: appears euvolemic. Continue lasix.   CKD: creat 1.65.   HTN: BPs well controlled  Dementia: discussed with wife and he is currently at his baseline mental status  Signed: Angelena Form, PA-C 01/04/2017 11:47 AM  Pager [phone]  Co-Sign MD  The patient was seen, examined and discussed with [name] Range, PA-C and I agree with the above.   83 y.o. male with a history of chronic atrial fibrillation (not on [place] given gait unsteadiness , non compliance and falls), chronic diastolic CHF, CKD, COPD, DMT2, HTN, HLD, dementia and obesity who presented to [facility] on 01/01/17 with increasing falls, weakness and confusion. MRI showed acute CVA and cardiology asked  about anticoagulation.   He was last seen by Dr. Burt Knack in 05/2016. He was started on lasix. He was felt to not be a candidate for chronic anticoagulation given gait unsteadiness and non compliance.He was continued on ASA 325mg  daily.    He has a history of falls, dalling 1-2 x a month. He walks with a walker. On the night before presentation, he fell 3x. EMS was called and he was brought to the ER. He denied CP, SOB, dizziness, lightheadedness, palpitations and repeatedly said that he is clumsy, weak and at times is confused with memory gaps.   In the ER head CT negative  for acute findings, chest XRay did not show any acute cardiopulmonary disease and EKG showed rate controlled AFib without acute ST-TW changes. Blood work showed elevated WBC of 17,200, elevated BUN - 20 and creatinine - 1.82 (the baseline creatinine is 1.54), lactic acid - 2.87.  Due to continued left sided weakness, MRI was ordered. This showed a right ACA territory stroke.   At this point I would not recommend anticoagulation as he had frequent falls already. Now with new stroke his risk is even higher. His dementia just supports this decision. I would continue rate control only.  2D ECHO showed normal LV function with mild AR/MR.   Ena Dawley, MD 01/04/2017

## 2017-01-04 NOTE — NC FL2 (Signed)
Combine LEVEL OF CARE SCREENING TOOL     IDENTIFICATION  Patient Name: [name] [name] Birthdate: February 21, 1926 Sex: male Admission Date (Current Location): 01/01/2017  [facility] and [place] Number:  Herbalist and Address:  [facility] [place] [place] [address], [place], [place] [address]      Provider Number: [id number]  Attending Physician Name and Address:  Florencia Reasons, MD  Relative Name and Phone Number:  Ga[place], [place]man;  [phone] (h),  [phone] (mobile)     Current Level of Care: Hospital Recommended Level of Care: [place] Prior Approval Number:    Date Approved/Denied:   PASRR Number: [id number] (Eff. 01/31/16)  Discharge Plan: SNF    Current Diagnoses: Patient Active Problem List   Diagnosis Date Noted  . Falls 01/03/2017  . Generalized weakness 01/01/2017  . Frequent falls 01/01/2017  . Acute hypoxemic respiratory failure ([place]) 04/09/2016  . COPD (chronic obstructive pulmonary disease) with chronic bronchitis ([place]) 03/12/2016  . Diabetes mellitus type 2, noninsulin dependent ([place])   . HCAP (healthcare-associated pneumonia) 02/26/2016  . Elevated troponin 02/26/2016  . Diabetes mellitus without complication ([place]) [id number]  . Elevated lactic acid level 01/28/2016  . Sepsis ([place]) 01/28/2016  . Acute renal failure superimposed on stage 3 chronic kidney disease ([place]) 04/21/2015  . Hyperglycemia 04/21/2015  . Bronchitis 04/21/2015  . Tachycardia 04/21/2015  . BPH (benign prostatic hypertrophy) 04/21/2015  . Dyspnea 12/28/2012  . Fall 12/28/2012  . OTHER DYSPNEA AND RESPIRATORY ABNORMALITIES 05/27/2010  . Hyperlipidemia 11/27/2008  . HYPERTENSION, BENIGN 11/27/2008  . Atrial fibrillation with RVR ([place]) 11/27/2008  . GERD 11/27/2008  . BPH/LUTS W/O OBSTRUCTION 11/27/2008  . ARTHRITIS 11/27/2008    Orientation RESPIRATION BLADDER Height & Weight     Self  Normal Incontinent Weight: 210 lb  1.6 oz (95.3 kg) Height:  5\' 7"  (170.2 cm)  BEHAVIORAL SYMPTOMS/MOOD NEUROLOGICAL BOWEL NUTRITION STATUS      Incontinent Diet (Heart healthy)  AMBULATORY STATUS COMMUNICATION OF NEEDS Skin   Total Care (Patient unable to ambulate during PT eval on 01/02/17) Verbally Other (Comment) (Ecchymosis left hand)                       Personal Care Assistance Level of Assistance  Bathing, Feeding, Dressing Bathing Assistance: Maximum assistance Feeding assistance: Independent Dressing Assistance: Maximum assistance     Functional Limitations Info  Sight, Hearing, Speech Sight Info: Adequate Hearing Info: Adequate Speech Info: Adequate    SPECIAL CARE FACTORS FREQUENCY  PT (By licensed PT)     PT Frequency: Evaluation 01/02/17 with a minimum of 3X per week therapy recommended              Contractures Contractures Info: Not present    Additional Factors Info  Code Status, Allergies, Insulin Sliding Scale Code Status Info: Full Allergies Info: Sulfa Antibiotics   Insulin Sliding Scale Info: 0-9 Units 3X per day with meals       Current Medications (01/04/2017):  This is the current hospital active medication list Current Facility-Administered Medications  Medication Dose Route Frequency Provider Last Rate Last Dose  . acetaminophen (TYLENOL) tablet 650 mg  650 mg Oral Q6H PRN Brenton Grills, PA-C       Or  . acetaminophen (TYLENOL) suppository 650 mg  650 mg Rectal Q6H PRN Brenton Grills, PA-C      . aspirin tablet 325 mg  325 mg Oral Daily Florencia Reasons, MD  325 mg at 01/04/17 1007  . atorvastatin (LIPITOR) tablet 20 mg  20 mg Oral q1800 Florencia Reasons, MD      . docusate sodium (COLACE) capsule 100 mg  100 mg Oral BID Brenton Grills, PA-C   100 mg at 01/04/17 1007  . donepezil (ARICEPT) tablet 5 mg  5 mg Oral Daily Brenton Grills, PA-C   5 mg at 01/04/17 1007  . finasteride (PROSCAR) tablet 5 mg  5 mg Oral Daily Brenton Grills, PA-C   5 mg at 01/04/17 1007  .  guaiFENesin (MUCINEX) 12 hr tablet 600 mg  600 mg Oral BID Florencia Reasons, MD   600 mg at 01/04/17 1006  . heparin injection 5,000 Units  5,000 Units Subcutaneous Q8H Rochele Raring, MD   5,000 Units at 01/03/17 2217  . insulin aspart (novoLOG) injection 0-9 Units  0-9 Units Subcutaneous TID WC Brenton Grills, PA-C   1 Units at 01/04/17 0840  . metoprolol succinate (TOPROL-XL) 24 hr tablet 50 mg  50 mg Oral QHS Brenton Grills, PA-C   50 mg at 01/03/17 2217  . oxybutynin (DITROPAN-XL) 24 hr tablet 5 mg  5 mg Oral Daily Brenton Grills, PA-C   5 mg at 01/04/17 1007  . traMADol (ULTRAM) tablet 50 mg  50 mg Oral Q8H PRN Brenton Grills, PA-C         Discharge Medications: Please see discharge summary for a list of discharge medications.  Relevant Imaging Results:  Relevant Lab Results:   Additional Information ss#[id number].  Sable Feil, LCSW

## 2017-01-04 NOTE — Progress Notes (Signed)
  Echocardiogram 2D Echocardiogram with Definity has been performed.  Tresa Res 01/04/2017, 12:18 PM

## 2017-01-04 NOTE — Clinical Social Work Note (Signed)
Clinical Social Work Assessment  Patient Details  Name: [name] [name] MRN: [id number] Date of Birth: 1926-11-28  Date of referral:  01/03/17               Reason for consult:  Facility Placement                Permission sought to share information with:  Family Supports Permission granted to share information::  No (Patient oriented to self only)  Name::     [name] [name]  Agency::     Relationship::  Wife  Contact Information:  [phone]  Housing/Transportation Living arrangements for the past 2 months:  Single Family Home Source of Information:  Spouse Patient Interpreter Needed:  None Criminal Activity/Legal Involvement Pertinent to Current Situation/Hospitalization:  No - Comment as needed Significant Relationships:  Adult Children, Spouse Lives with:  Spouse Do you feel safe going back to the place where you live?  No (Wife in agreement with ST rehab) Need for family participation in patient care:  Yes (Comment)  Care giving concerns:  Wife aware of and expressed agreement with recommendation of ST rehab for spouse.   Social Worker assessment / plan:  CSW talked with Mrs. Bourbon regarding discharge plan and recommendation of ST rehab. Wife in agreement and facility search process explained. CSW provided with preferences by Mrs. [name] [name] - [facility] and [facility]  Employment status:  Retired Forensic scientist:  Advice worker) PT Recommendations:  [place] / Referral to community resources:  [place]  Patient/Family's Response to care:  No concerns expressed regarding care during hospitalization.  Patient/Family's Understanding of and Emotional Response to Diagnosis, Current Treatment, and Prognosis:  Not discussed.  Emotional Assessment Appearance:  Appears stated age Attitude/Demeanor/Rapport:  Unable to Assess (Did not interact with patient as oriented to self only) Affect (typically  observed):  Unable to Assess Orientation:  Oriented to Self Alcohol / Substance use:  Tobacco Use, Alcohol Use, Illicit Drugs (Patient reports that he quit smoking, drinks alcohol and does not use illicit drugs) Psych involvement (Current and /or in the community):  No (Comment)  Discharge Needs  Concerns to be addressed:  Discharge Planning Concerns Readmission within the last 30 days:  No Current discharge risk:  None Barriers to Discharge:  No Barriers Identified   Sable Feil, LCSW 01/04/2017, 1:51 PM

## 2017-01-04 NOTE — Clinical Social Work Placement (Signed)
   CLINICAL SOCIAL WORK PLACEMENT  NOTE 01/04/17 - DISCHARGED TO [facility]  Date:  01/04/2017  Patient Details  Name: [name] [name] MRN: [id number] Date of Birth: 08-Jul-1926  Clinical Social Work is seeking post-discharge placement for this patient at the [place] level of care (*CSW will initial, date and re-position this form in  chart as items are completed):  No (Wife provided CSW with facility preferences)   Patient/family provided with [place] Work Department's list of facilities offering this level of care within the geographic area requested by the patient (or if unable, by the patient's family).  Yes   Patient/family informed of their freedom to choose among providers that offer the needed level of care, that participate in Medicare, Medicaid or managed care program needed by the patient, have an available bed and are willing to accept the patient.  No   Patient/family informed of [facility] [place]'s ownership interest in [facility] and [facility], as well as of the fact that they are under no obligation to receive care at these facilities.  PASRR submitted to EDS on       PASRR number received on       Existing PASRR number confirmed on 01/04/17     FL2 transmitted to all facilities in geographic area requested by pt/family on 01/04/17     FL2 transmitted to all facilities within larger geographic area on       Patient informed that his/her managed care company has contracts with or will negotiate with certain facilities, including the following:         01/04/17 - Patient/family informed of bed offers received.  Patient chooses bed at  [facility]     Physician recommends and patient chooses bed at      Patient to be transferred to  [facility] on  01/04/17.  Patient to be transferred to facility by  ambulance     Patient family notified on  01/04/17 of transfer.  Name of family member notified:   Wife,  [name] [name]     PHYSICIAN      Additional Comment:  01/04/17 - Received insurance authorization: [id number]; 01/06/17 next review date; auth valid through 1/17 and RUG Level is RUB. This information given to admissions director, Nira Conn at [facility].   _______________________________________________ Sable Feil, LCSW 01/04/2017, 4:58 PM

## 2017-01-04 NOTE — Clinical Social Work Placement (Signed)
   CLINICAL SOCIAL WORK PLACEMENT  NOTE  Date:  01/04/2017  Patient Details  Name: [name] [name] MRN: [id number] Date of Birth: Feb 29, 1926  Clinical Social Work is seeking post-discharge placement for this patient at the [place] level of care (*CSW will initial, date and re-position this form in  chart as items are completed):  No (Wife provided CSW with facility preferences)   Patient/family provided with [facility] Work Department's list of facilities offering this level of care within the geographic area requested by the patient (or if unable, by the patient's family).  Yes   Patient/family informed of their freedom to choose among providers that offer the needed level of care, that participate in Medicare, Medicaid or managed care program needed by the patient, have an available bed and are willing to accept the patient.  No   Patient/family informed of [facility]'s ownership interest in [facility] and [facility], as well as of the fact that they are under no obligation to receive care at these facilities.  PASRR submitted to EDS on       PASRR number received on       Existing PASRR number confirmed on 01/04/17     FL2 transmitted to all facilities in geographic area requested by pt/family on 01/04/17     FL2 transmitted to all facilities within larger geographic area on       Patient informed that his/her managed care company has contracts with or will negotiate with certain facilities, including the following:            Patient/family informed of bed offers received.  Patient chooses bed at       Physician recommends and patient chooses bed at      Patient to be transferred to   on  .  Patient to be transferred to facility by       Patient family notified on   of transfer.  Name of family member notified:        PHYSICIAN      Additional Comment:  01/04/17 - Submitted clinicals to [facility] for insurance  preauthorization.   _______________________________________________ Sable Feil, LCSW 01/04/2017, 1:55 PM

## 2017-01-04 NOTE — Progress Notes (Signed)
VASCULAR LAB PRELIMINARY  PRELIMINARY  PRELIMINARY  PRELIMINARY  Carotid duplex completed.    Preliminary report:  1-39% ICA plaquing.  Vertebral artery flow is antegrade.   Tabor Bartram, RVT 01/04/2017, 3:33 PM

## 2017-01-04 NOTE — Progress Notes (Signed)
Report called to RN at [facility], IV and tele discontinued, PTAR called.

## 2017-01-05 DIAGNOSIS — I639 Cerebral infarction, unspecified: Secondary | ICD-10-CM | POA: Diagnosis not present

## 2017-01-05 DIAGNOSIS — E872 Acidosis: Secondary | ICD-10-CM | POA: Diagnosis not present

## 2017-01-05 DIAGNOSIS — E119 Type 2 diabetes mellitus without complications: Secondary | ICD-10-CM | POA: Diagnosis not present

## 2017-01-05 DIAGNOSIS — R296 Repeated falls: Secondary | ICD-10-CM | POA: Diagnosis not present

## 2017-01-05 DIAGNOSIS — I10 Essential (primary) hypertension: Secondary | ICD-10-CM | POA: Diagnosis not present

## 2017-01-05 LAB — VAS US CAROTID
LCCADSYS: -66 cm/s
LCCAPDIAS: 19 cm/s
LEFT ECA DIAS: -12 cm/s
LICADDIAS: -24 cm/s
LICADSYS: -61 cm/s
LICAPDIAS: -15 cm/s
LICAPSYS: -44 cm/s
Left CCA dist dias: -16 cm/s
Left CCA prox sys: 95 cm/s
RIGHT ECA DIAS: -11 cm/s
RIGHT VERTEBRAL DIAS: -4 cm/s
Right CCA prox dias: 9 cm/s
Right CCA prox sys: 55 cm/s
Right cca dist sys: -78 cm/s

## 2017-01-13 ENCOUNTER — Ambulatory Visit: Payer: Medicare Other | Admitting: Cardiovascular Disease

## 2017-01-21 DIAGNOSIS — B353 Tinea pedis: Secondary | ICD-10-CM | POA: Diagnosis not present

## 2017-01-21 DIAGNOSIS — I8310 Varicose veins of unspecified lower extremity with inflammation: Secondary | ICD-10-CM | POA: Diagnosis not present

## 2017-02-12 DIAGNOSIS — R2681 Unsteadiness on feet: Secondary | ICD-10-CM | POA: Diagnosis not present

## 2017-02-12 DIAGNOSIS — R278 Other lack of coordination: Secondary | ICD-10-CM | POA: Diagnosis not present

## 2017-02-12 DIAGNOSIS — J449 Chronic obstructive pulmonary disease, unspecified: Secondary | ICD-10-CM | POA: Diagnosis not present

## 2017-02-12 DIAGNOSIS — R296 Repeated falls: Secondary | ICD-10-CM | POA: Diagnosis not present

## 2017-02-16 DIAGNOSIS — R296 Repeated falls: Secondary | ICD-10-CM | POA: Diagnosis not present

## 2017-02-16 DIAGNOSIS — R2681 Unsteadiness on feet: Secondary | ICD-10-CM | POA: Diagnosis not present

## 2017-02-16 DIAGNOSIS — J449 Chronic obstructive pulmonary disease, unspecified: Secondary | ICD-10-CM | POA: Diagnosis not present

## 2017-02-16 DIAGNOSIS — R278 Other lack of coordination: Secondary | ICD-10-CM | POA: Diagnosis not present

## 2017-02-19 DIAGNOSIS — E669 Obesity, unspecified: Secondary | ICD-10-CM | POA: Diagnosis not present

## 2017-02-19 DIAGNOSIS — M6281 Muscle weakness (generalized): Secondary | ICD-10-CM | POA: Diagnosis not present

## 2017-02-19 DIAGNOSIS — I5030 Unspecified diastolic (congestive) heart failure: Secondary | ICD-10-CM | POA: Diagnosis not present

## 2017-02-19 DIAGNOSIS — J449 Chronic obstructive pulmonary disease, unspecified: Secondary | ICD-10-CM | POA: Diagnosis not present

## 2017-02-22 DIAGNOSIS — J449 Chronic obstructive pulmonary disease, unspecified: Secondary | ICD-10-CM | POA: Diagnosis not present

## 2017-02-22 DIAGNOSIS — I482 Chronic atrial fibrillation: Secondary | ICD-10-CM | POA: Diagnosis not present

## 2017-02-22 DIAGNOSIS — E1122 Type 2 diabetes mellitus with diabetic chronic kidney disease: Secondary | ICD-10-CM | POA: Diagnosis not present

## 2017-02-22 DIAGNOSIS — I69354 Hemiplegia and hemiparesis following cerebral infarction affecting left non-dominant side: Secondary | ICD-10-CM | POA: Diagnosis not present

## 2017-03-03 DIAGNOSIS — I130 Hypertensive heart and chronic kidney disease with heart failure and stage 1 through stage 4 chronic kidney disease, or unspecified chronic kidney disease: Secondary | ICD-10-CM | POA: Diagnosis not present

## 2017-03-03 DIAGNOSIS — E1129 Type 2 diabetes mellitus with other diabetic kidney complication: Secondary | ICD-10-CM | POA: Diagnosis not present

## 2017-03-03 DIAGNOSIS — M6289 Other specified disorders of muscle: Secondary | ICD-10-CM | POA: Diagnosis not present

## 2017-03-03 DIAGNOSIS — I509 Heart failure, unspecified: Secondary | ICD-10-CM | POA: Diagnosis not present

## 2017-03-22 DIAGNOSIS — M6281 Muscle weakness (generalized): Secondary | ICD-10-CM | POA: Diagnosis not present

## 2017-03-22 DIAGNOSIS — I5030 Unspecified diastolic (congestive) heart failure: Secondary | ICD-10-CM | POA: Diagnosis not present

## 2017-03-22 DIAGNOSIS — J449 Chronic obstructive pulmonary disease, unspecified: Secondary | ICD-10-CM | POA: Diagnosis not present

## 2017-03-22 DIAGNOSIS — E669 Obesity, unspecified: Secondary | ICD-10-CM | POA: Diagnosis not present

## 2017-04-21 DIAGNOSIS — I5030 Unspecified diastolic (congestive) heart failure: Secondary | ICD-10-CM | POA: Diagnosis not present

## 2017-04-21 DIAGNOSIS — E669 Obesity, unspecified: Secondary | ICD-10-CM | POA: Diagnosis not present

## 2017-04-21 DIAGNOSIS — J449 Chronic obstructive pulmonary disease, unspecified: Secondary | ICD-10-CM | POA: Diagnosis not present

## 2017-04-21 DIAGNOSIS — M6281 Muscle weakness (generalized): Secondary | ICD-10-CM | POA: Diagnosis not present

## 2017-04-27 DIAGNOSIS — I509 Heart failure, unspecified: Secondary | ICD-10-CM | POA: Diagnosis not present

## 2017-04-27 DIAGNOSIS — C61 Malignant neoplasm of prostate: Secondary | ICD-10-CM | POA: Diagnosis not present

## 2017-04-27 DIAGNOSIS — I130 Hypertensive heart and chronic kidney disease with heart failure and stage 1 through stage 4 chronic kidney disease, or unspecified chronic kidney disease: Secondary | ICD-10-CM | POA: Diagnosis not present

## 2017-04-27 DIAGNOSIS — M6289 Other specified disorders of muscle: Secondary | ICD-10-CM | POA: Diagnosis not present

## 2017-05-10 DIAGNOSIS — Z6835 Body mass index (BMI) 35.0-35.9, adult: Secondary | ICD-10-CM | POA: Diagnosis not present

## 2017-05-10 DIAGNOSIS — L2089 Other atopic dermatitis: Secondary | ICD-10-CM | POA: Diagnosis not present

## 2017-05-22 DIAGNOSIS — I5030 Unspecified diastolic (congestive) heart failure: Secondary | ICD-10-CM | POA: Diagnosis not present

## 2017-05-22 DIAGNOSIS — E669 Obesity, unspecified: Secondary | ICD-10-CM | POA: Diagnosis not present

## 2017-05-22 DIAGNOSIS — M6281 Muscle weakness (generalized): Secondary | ICD-10-CM | POA: Diagnosis not present

## 2017-05-22 DIAGNOSIS — J449 Chronic obstructive pulmonary disease, unspecified: Secondary | ICD-10-CM | POA: Diagnosis not present

## 2017-06-21 DIAGNOSIS — I5030 Unspecified diastolic (congestive) heart failure: Secondary | ICD-10-CM | POA: Diagnosis not present

## 2017-06-21 DIAGNOSIS — E669 Obesity, unspecified: Secondary | ICD-10-CM | POA: Diagnosis not present

## 2017-06-21 DIAGNOSIS — J449 Chronic obstructive pulmonary disease, unspecified: Secondary | ICD-10-CM | POA: Diagnosis not present

## 2017-06-21 DIAGNOSIS — M6281 Muscle weakness (generalized): Secondary | ICD-10-CM | POA: Diagnosis not present

## 2017-07-01 DIAGNOSIS — R350 Frequency of micturition: Secondary | ICD-10-CM | POA: Diagnosis not present

## 2017-07-01 DIAGNOSIS — N3942 Incontinence without sensory awareness: Secondary | ICD-10-CM | POA: Diagnosis not present

## 2017-07-01 DIAGNOSIS — N3941 Urge incontinence: Secondary | ICD-10-CM | POA: Diagnosis not present

## 2017-07-22 DIAGNOSIS — I5030 Unspecified diastolic (congestive) heart failure: Secondary | ICD-10-CM | POA: Diagnosis not present

## 2017-07-22 DIAGNOSIS — J449 Chronic obstructive pulmonary disease, unspecified: Secondary | ICD-10-CM | POA: Diagnosis not present

## 2017-07-22 DIAGNOSIS — M6281 Muscle weakness (generalized): Secondary | ICD-10-CM | POA: Diagnosis not present

## 2017-07-22 DIAGNOSIS — E669 Obesity, unspecified: Secondary | ICD-10-CM | POA: Diagnosis not present

## 2017-08-11 DIAGNOSIS — N183 Chronic kidney disease, stage 3 (moderate): Secondary | ICD-10-CM | POA: Diagnosis not present

## 2017-08-11 DIAGNOSIS — E1129 Type 2 diabetes mellitus with other diabetic kidney complication: Secondary | ICD-10-CM | POA: Diagnosis not present

## 2017-08-11 DIAGNOSIS — I130 Hypertensive heart and chronic kidney disease with heart failure and stage 1 through stage 4 chronic kidney disease, or unspecified chronic kidney disease: Secondary | ICD-10-CM | POA: Diagnosis not present

## 2017-08-11 DIAGNOSIS — M6289 Other specified disorders of muscle: Secondary | ICD-10-CM | POA: Diagnosis not present

## 2017-08-15 ENCOUNTER — Other Ambulatory Visit: Payer: Self-pay | Admitting: Cardiovascular Disease

## 2017-08-22 DIAGNOSIS — J449 Chronic obstructive pulmonary disease, unspecified: Secondary | ICD-10-CM | POA: Diagnosis not present

## 2017-08-22 DIAGNOSIS — M6281 Muscle weakness (generalized): Secondary | ICD-10-CM | POA: Diagnosis not present

## 2017-08-22 DIAGNOSIS — I5030 Unspecified diastolic (congestive) heart failure: Secondary | ICD-10-CM | POA: Diagnosis not present

## 2017-08-22 DIAGNOSIS — E669 Obesity, unspecified: Secondary | ICD-10-CM | POA: Diagnosis not present

## 2017-09-14 ENCOUNTER — Encounter: Payer: Self-pay | Admitting: Sports Medicine

## 2017-09-14 ENCOUNTER — Ambulatory Visit (INDEPENDENT_AMBULATORY_CARE_PROVIDER_SITE_OTHER): Payer: Medicare Other | Admitting: Sports Medicine

## 2017-09-14 VITALS — BP 128/101 | HR 54 | Resp 16

## 2017-09-14 DIAGNOSIS — M795 Residual foreign body in soft tissue: Secondary | ICD-10-CM | POA: Diagnosis not present

## 2017-09-14 DIAGNOSIS — L02611 Cutaneous abscess of right foot: Secondary | ICD-10-CM

## 2017-09-14 DIAGNOSIS — L03031 Cellulitis of right toe: Secondary | ICD-10-CM | POA: Diagnosis not present

## 2017-09-14 DIAGNOSIS — I739 Peripheral vascular disease, unspecified: Secondary | ICD-10-CM

## 2017-09-14 MED ORDER — AMOXICILLIN-POT CLAVULANATE 875-125 MG PO TABS: 1.0000 | ORAL_TABLET | Freq: Two times a day (BID) | ORAL | 0 refills | Status: DC

## 2017-09-14 NOTE — Progress Notes (Signed)
Subjective: DELMA VILLALVA is a 83 y.o. male patient who presents to office for evaluation of Right foot pain after stepping on glass 1 week ago. Reports he doesn't do much walking but had broken some glass from a cup that was dropped in the house and may have stepped on it according to wife. Patient also reports that his nails are extremely thick and discolored. Reports that he has a hard time caring for self. Wife reports that they have 2 aids that come to the house that assist with bathing.  Last visit to cardiologist was over 6 months ago.   ROS per nurse note   Patient Active Problem List   Diagnosis Date Noted  . Cerebral embolism with cerebral infarction 01/04/2017  . Dementia without behavioral disturbance   . Falls 01/03/2017  . Generalized weakness 01/01/2017  . Frequent falls 01/01/2017  . Acute hypoxemic respiratory failure ([place]) 04/09/2016  . COPD (chronic obstructive pulmonary disease) with chronic bronchitis ([place]) 03/12/2016  . HCAP (healthcare-associated pneumonia) 02/26/2016  . Elevated troponin 02/26/2016  . Elevated lactic acid level 01/28/2016  . Sepsis ([place]) 01/28/2016  . Acute renal failure superimposed on stage 3 chronic kidney disease ([place]) 04/21/2015  . Hyperglycemia 04/21/2015  . Bronchitis 04/21/2015  . Tachycardia 04/21/2015  . BPH (benign prostatic hypertrophy) 04/21/2015  . Dyspnea 12/28/2012  . Fall 12/28/2012  . OTHER DYSPNEA AND RESPIRATORY ABNORMALITIES 05/27/2010  . Hyperlipidemia 11/27/2008  . HYPERTENSION, BENIGN 11/27/2008  . Atrial fibrillation with RVR ([place]) 11/27/2008  . GERD 11/27/2008  . BPH/LUTS W/O OBSTRUCTION 11/27/2008  . ARTHRITIS 11/27/2008    Current Outpatient Prescriptions on File Prior to Visit  Medication Sig Dispense Refill  . aspirin 325 MG tablet Take 1 tablet (325 mg total) by mouth daily. 30 tablet 0  . atorvastatin (LIPITOR) 20 MG tablet Take 1 tablet (20 mg total) by mouth daily at 6 PM. 30 tablet 0  . donepezil  (ARICEPT) 5 MG tablet Take 5 mg by mouth daily.  6  . finasteride (PROSCAR) 5 MG tablet Take 5 mg by mouth daily.    . furosemide (LASIX) 20 MG tablet Take 1 tablet (20 mg total) by mouth every Monday, Wednesday, and Friday. 90 tablet 3  . guaiFENesin (MUCINEX) 600 MG 12 hr tablet Take 1 tablet (600 mg total) by mouth 2 (two) times daily. 30 tablet 0  . metoprolol succinate (TOPROL-XL) 50 MG 24 hr tablet TAKE 1 TABLET (50 MG TOTAL) BY MOUTH AT BEDTIME. 30 tablet 0  . Multiple Vitamin (MULTIVITAMIN WITH MINERALS) TABS tablet Take 1 tablet by mouth daily.     No current facility-administered medications on file prior to visit.     Allergies  Allergen Reactions  . Sulfa Antibiotics Swelling    hands    Objective:  General: Alert and oriented x3 in no acute distress  Dermatology:Puncture wound at right medial 1st toe with visible shard of glass with mild focal erythema, focal edema, all nails x 10 are thick, yellow, mycotic and elongated.   Vascular: Dorsalis Pedis and Posterior Tibial pedal pulses not palpable, Capillary Fill Time >5seconds,(-) pedal hair growth bilateral, purple discoloration to both feet and legs. Temperature gradient severely decreased bilateral.   Neurology: Gross sensation intact via light touch bilateral.  Musculoskeletal: Mild tenderness with palpation at right 1st toe at area of foreign body, Strength 4/5 in all groups bilateral.   Assessment and Plan: Problem List Items Addressed This Visit    None    Visit Diagnoses  Cellulitis and abscess of toe of right foot    -  Primary   Relevant Medications   amoxicillin-clavulanate (AUGMENTIN) 875-125 MG tablet   Residual foreign body in soft tissue       Right 1st toe   PVD (peripheral vascular disease) ([place])           -Complete examination performed -Discussed treatement options for glass foreign body -After verbal consent and betadine prep, local injection of 1% lidocaine and 0.5% marcaine was  administered using total 3cc's in a field block fashion then using a sterile pick up the glass was removed from the medial right 1st toe, The area was flushed with saline and antibiotic dressing was applied. Patient to do the same at home daily -Rx Augmetin -Complimentary nail trim was also performed for patient at today's visit -Recommend patient that we will consider sooner cardiology or vascular follow up if area is slow to heal -Patient to return to office in 3 weeks for foreign body removal site check or sooner if condition worsens.  Landis Martins, DPM

## 2017-09-14 NOTE — Progress Notes (Signed)
   Subjective:    Patient ID: [name] [name], male    DOB: 1926/02/04, 83 y.o.   MRN: [id number]  HPI    Review of Systems  Respiratory: Positive for shortness of breath.   Genitourinary: Positive for frequency and urgency.  Musculoskeletal: Positive for back pain, gait problem and myalgias.  Neurological: Positive for weakness.  All other systems reviewed and are negative.      Objective:   Physical Exam        Assessment & Plan:

## 2017-09-21 DIAGNOSIS — M6281 Muscle weakness (generalized): Secondary | ICD-10-CM | POA: Diagnosis not present

## 2017-09-21 DIAGNOSIS — I5030 Unspecified diastolic (congestive) heart failure: Secondary | ICD-10-CM | POA: Diagnosis not present

## 2017-09-21 DIAGNOSIS — J449 Chronic obstructive pulmonary disease, unspecified: Secondary | ICD-10-CM | POA: Diagnosis not present

## 2017-09-21 DIAGNOSIS — E669 Obesity, unspecified: Secondary | ICD-10-CM | POA: Diagnosis not present

## 2017-10-01 ENCOUNTER — Other Ambulatory Visit: Payer: Self-pay | Admitting: Cardiovascular Disease

## 2017-10-02 ENCOUNTER — Observation Stay (HOSPITAL_COMMUNITY)
Admission: EM | Admit: 2017-10-02 | Discharge: 2017-10-06 | Disposition: A | Discharge status: Home / Self Care | Payer: Medicare Other | Attending: Internal Medicine | Admitting: Internal Medicine

## 2017-10-02 ENCOUNTER — Emergency Department (HOSPITAL_COMMUNITY): Payer: Medicare Other

## 2017-10-02 ENCOUNTER — Encounter (HOSPITAL_COMMUNITY): Payer: Self-pay | Admitting: *Deleted

## 2017-10-02 DIAGNOSIS — F0390 Unspecified dementia without behavioral disturbance: Secondary | ICD-10-CM | POA: Diagnosis present

## 2017-10-02 DIAGNOSIS — R197 Diarrhea, unspecified: Secondary | ICD-10-CM | POA: Diagnosis not present

## 2017-10-02 DIAGNOSIS — R55 Syncope and collapse: Secondary | ICD-10-CM | POA: Diagnosis present

## 2017-10-02 DIAGNOSIS — A419 Sepsis, unspecified organism: Secondary | ICD-10-CM | POA: Diagnosis present

## 2017-10-02 DIAGNOSIS — N179 Acute kidney failure, unspecified: Principal | ICD-10-CM | POA: Diagnosis present

## 2017-10-02 DIAGNOSIS — N183 Chronic kidney disease, stage 3 (moderate): Secondary | ICD-10-CM | POA: Insufficient documentation

## 2017-10-02 DIAGNOSIS — E860 Dehydration: Secondary | ICD-10-CM

## 2017-10-02 DIAGNOSIS — Z79899 Other long term (current) drug therapy: Secondary | ICD-10-CM | POA: Diagnosis not present

## 2017-10-02 DIAGNOSIS — R296 Repeated falls: Secondary | ICD-10-CM | POA: Diagnosis not present

## 2017-10-02 DIAGNOSIS — I4891 Unspecified atrial fibrillation: Secondary | ICD-10-CM

## 2017-10-02 DIAGNOSIS — W19XXXA Unspecified fall, initial encounter: Secondary | ICD-10-CM | POA: Diagnosis not present

## 2017-10-02 DIAGNOSIS — E876 Hypokalemia: Secondary | ICD-10-CM | POA: Diagnosis not present

## 2017-10-02 DIAGNOSIS — R778 Other specified abnormalities of plasma proteins: Secondary | ICD-10-CM

## 2017-10-02 DIAGNOSIS — Z6829 Body mass index (BMI) 29.0-29.9, adult: Secondary | ICD-10-CM | POA: Diagnosis not present

## 2017-10-02 DIAGNOSIS — R059 Cough, unspecified: Secondary | ICD-10-CM

## 2017-10-02 DIAGNOSIS — I130 Hypertensive heart and chronic kidney disease with heart failure and stage 1 through stage 4 chronic kidney disease, or unspecified chronic kidney disease: Secondary | ICD-10-CM | POA: Diagnosis not present

## 2017-10-02 DIAGNOSIS — R05 Cough: Secondary | ICD-10-CM

## 2017-10-02 DIAGNOSIS — K529 Noninfective gastroenteritis and colitis, unspecified: Secondary | ICD-10-CM | POA: Diagnosis not present

## 2017-10-02 DIAGNOSIS — N1831 Chronic kidney disease, stage 3a: Secondary | ICD-10-CM | POA: Diagnosis present

## 2017-10-02 DIAGNOSIS — Z8673 Personal history of transient ischemic attack (TIA), and cerebral infarction without residual deficits: Secondary | ICD-10-CM | POA: Insufficient documentation

## 2017-10-02 DIAGNOSIS — E1122 Type 2 diabetes mellitus with diabetic chronic kidney disease: Secondary | ICD-10-CM | POA: Insufficient documentation

## 2017-10-02 DIAGNOSIS — E669 Obesity, unspecified: Secondary | ICD-10-CM | POA: Diagnosis not present

## 2017-10-02 DIAGNOSIS — R748 Abnormal levels of other serum enzymes: Secondary | ICD-10-CM | POA: Diagnosis not present

## 2017-10-02 DIAGNOSIS — J449 Chronic obstructive pulmonary disease, unspecified: Secondary | ICD-10-CM | POA: Diagnosis not present

## 2017-10-02 DIAGNOSIS — E785 Hyperlipidemia, unspecified: Secondary | ICD-10-CM | POA: Diagnosis present

## 2017-10-02 DIAGNOSIS — I5032 Chronic diastolic (congestive) heart failure: Secondary | ICD-10-CM | POA: Insufficient documentation

## 2017-10-02 DIAGNOSIS — Z7901 Long term (current) use of anticoagulants: Secondary | ICD-10-CM | POA: Diagnosis not present

## 2017-10-02 DIAGNOSIS — J4489 Other specified chronic obstructive pulmonary disease: Secondary | ICD-10-CM | POA: Diagnosis present

## 2017-10-02 DIAGNOSIS — I482 Chronic atrial fibrillation, unspecified: Secondary | ICD-10-CM | POA: Diagnosis present

## 2017-10-02 DIAGNOSIS — N400 Enlarged prostate without lower urinary tract symptoms: Secondary | ICD-10-CM | POA: Diagnosis not present

## 2017-10-02 DIAGNOSIS — R7989 Other specified abnormal findings of blood chemistry: Secondary | ICD-10-CM | POA: Diagnosis present

## 2017-10-02 HISTORY — DX: Dehydration: E86.0

## 2017-10-02 HISTORY — DX: Syncope and collapse: R55

## 2017-10-02 HISTORY — DX: Hypokalemia: E87.6

## 2017-10-02 LAB — PROTIME-INR
INR: 1.21
Prothrombin Time: 15.2 seconds (ref 11.4–15.2)

## 2017-10-02 LAB — COMPREHENSIVE METABOLIC PANEL
ALT: 32 U/L (ref 17–63)
AST: 48 U/L — ABNORMAL HIGH (ref 15–41)
Albumin: 3.3 g/dL — ABNORMAL LOW (ref 3.5–5.0)
Alkaline Phosphatase: 110 U/L (ref 38–126)
Anion gap: 13 (ref 5–15)
BUN: 30 mg/dL — ABNORMAL HIGH (ref 6–20)
CO2: 22 mmol/L (ref 22–32)
Calcium: 8.6 mg/dL — ABNORMAL LOW (ref 8.9–10.3)
Chloride: 93 mmol/L — ABNORMAL LOW (ref 101–111)
Creatinine, Ser: 2.12 mg/dL — ABNORMAL HIGH (ref 0.61–1.24)
GFR, EST AFRICAN AMERICAN: 30 mL/min — AB (ref >60)
GFR, EST NON AFRICAN AMERICAN: 26 mL/min — AB (ref >60)
Glucose, Bld: 196 mg/dL — ABNORMAL HIGH (ref 65–99)
POTASSIUM: 3.1 mmol/L — AB (ref 3.5–5.1)
Sodium: 128 mmol/L — ABNORMAL LOW (ref 135–145)
Total Bilirubin: 1.4 mg/dL — ABNORMAL HIGH (ref 0.3–1.2)
Total Protein: 7.8 g/dL (ref 6.5–8.1)

## 2017-10-02 LAB — CBC
HEMATOCRIT: 42.1 % (ref 39.0–52.0)
Hemoglobin: 14.4 g/dL (ref 13.0–17.0)
MCH: 32.3 pg (ref 26.0–34.0)
MCHC: 34.2 g/dL (ref 30.0–36.0)
MCV: 94.4 fL (ref 78.0–100.0)
PLATELETS: 231 10*3/uL (ref 150–400)
RBC: 4.46 MIL/uL (ref 4.22–5.81)
RDW: 13.7 % (ref 11.5–15.5)
WBC: 26.3 10*3/uL — AB (ref 4.0–10.5)

## 2017-10-02 LAB — BRAIN NATRIURETIC PEPTIDE: B Natriuretic Peptide: 122.8 pg/mL — ABNORMAL HIGH (ref 0.0–100.0)

## 2017-10-02 LAB — LIPASE, BLOOD: Lipase: 45 U/L (ref 11–51)

## 2017-10-02 LAB — APTT: aPTT: 31 seconds (ref 24–36)

## 2017-10-02 MED ORDER — POTASSIUM CHLORIDE CRYS ER 20 MEQ PO TBCR
20.0000 meq | EXTENDED_RELEASE_TABLET | Freq: Once | ORAL | Status: AC
  Administered 2017-10-02: 20 meq via ORAL
  Filled 2017-10-02: qty 1

## 2017-10-02 NOTE — ED Triage Notes (Signed)
Pt c/o abd pain with vomiting and diarrhea since yesterday and he states he has been passing out x 2 today

## 2017-10-02 NOTE — H&P (Signed)
VA BROADWELL [id number] DOB: February 03, 1926 DOA: 10/02/2017     PCP: Haywood Pao, MD   Outpatient Specialists: [facility]  Patient coming from:  home Lives  With family    Chief Complaint: fall, diarrhea  HPI: [name] [name] is a 83 y.o. male with medical history significant of chronic atrial fibrillation, chronic diastolic heart failure, COPD, diabetes, and obesity, HTN, HLD, A.fib not on anticoagulation due to falls, OSA , DM2, dementia, chronic diastolic CHF    Presented with abdominal pain nausea vomiting diarrhea since yesterday. With 2 episodes of syncope per EMS but patient denies stetes he is clumsy and fell. Has frequent falls  no fever no chest pain have had antibiotics recently amoxicillin patient has dementia at baseline and unable to provide attempted history   Regarding pertinent Chronic problems:   Last echo:  EF 55-60% History of atrial fibrillation not on anticoagulation secondary to frequent falls  IN ER:  Temp (24hrs), Avg:99.5 F (37.5 C), Min:99.5 F (37.5 C), Max:99.5 F (37.5 C)      on arrival  ED Triage Vitals  Enc Vitals Group     BP 10/02/17 1740 139/88     Pulse Rate 10/02/17 1740 (!) 114     Resp 10/02/17 1740 20     Temp 10/02/17 1740 99.5 F (37.5 C)     Temp Source 10/02/17 1740 Oral     SpO2 10/02/17 1740 99 %     Weight 10/02/17 1745 210 lb (95.3 kg)     Height --      Head Circumference --      Peak Flow --      Pain Score 10/02/17 1745 7     Pain Loc --      Pain Edu? --      Excl. in GC? --     Latest RR 15 sating 95% HR 108 BP 114/66  INR 1.21 NA 128 K 3.1 Following Medications were ordered in ER: Medications - No data to display   ER provider discussed case with:  Cardiology Who recommends: admit and cycle CE We'll see patient in consult in ER   Hospitalist was called for admission for elevated troponin in the setting of dehydration a KI syncope sepsis secondary to possible infectious diarrhea    Review of Systems:    Pertinent positives include: falls, nausea, diarrhea,   Constitutional:  No weight loss, night sweats, Fevers, chills, fatigue, weight loss  HEENT:  No headaches, Difficulty swallowing,Tooth/dental problems,Sore throat,  No sneezing, itching, ear ache, nasal congestion, post nasal drip,  Cardio-vascular:  No chest pain, Orthopnea, PND, anasarca, dizziness, palpitations.no Bilateral lower extremity swelling  GI:  No heartburn, indigestion, abdominal pain,  vomiting, change in bowel habits, loss of appetite, melena, blood in stool, hematemesis Resp:  no shortness of breath at rest. No dyspnea on exertion, No excess mucus, no productive cough, No non-productive cough, No coughing up of blood.No change in color of mucus.No wheezing. Skin:  no rash or lesions. No jaundice GU:  no dysuria, change in color of urine, no urgency or frequency. No straining to urinate.  No flank pain.  Musculoskeletal:  No joint pain or no joint swelling. No decreased range of motion. No back pain.  Psych:  No change in mood or affect. No depression or anxiety. No memory loss.  Neuro: no localizing neurological complaints, no tingling, no weakness, no double vision, no gait abnormality, no slurred speech, no confusion  As per HPI  otherwise 10 point review of systems negative.   Past Medical History: Past Medical History:  Diagnosis Date  . Arthritis   . Chronic atrial fibrillation ([place])   . Diabetes mellitus without complication ([place])   . Hyperlipidemia   . Hypertension   . Sleep apnea    no cpap used, sleep study was several yrs ago  . Urinary retention foley last changed 3 weeks ago   Past Surgical History:  Procedure Laterality Date  . BACK SURGERY  yrs ago   lower back surgery  . PROSTATE SURGERY  yrs ago, no cancer  . TONSILLECTOMY  as child  . TRANSURETHRAL RESECTION OF PROSTATE  02/22/2012   Procedure: TRANSURETHRAL RESECTION OF THE PROSTATE WITH GYRUS INSTRUMENTS;   Surgeon: Molli Hazard, MD;  Location: [facility];  Service: Urology;  Laterality: N/A;         Social History:  Ambulatory  independently     reports that he quit smoking about 36 years ago. His smoking use included Cigarettes. He has a 25.00 pack-year smoking history. He has never used smokeless tobacco. He reports that he drinks alcohol. He reports that he does not use drugs.  Allergies:   Allergies  Allergen Reactions  . Sulfa Antibiotics Swelling    hands       Family History:   Family History  Problem Relation Age of Onset  . Heart attack Father     Medications: Prior to Admission medications   Medication Sig Start Date End Date Taking? Authorizing Provider  amoxicillin-clavulanate (AUGMENTIN) 875-125 MG tablet Take 1 tablet by mouth 2 (two) times daily. 09/14/17   Landis Martins, DPM  aspirin 325 MG tablet Take 1 tablet (325 mg total) by mouth daily. 01/05/17   Florencia Reasons, MD  atorvastatin (LIPITOR) 20 MG tablet Take 1 tablet (20 mg total) by mouth daily at 6 PM. 01/04/17   Florencia Reasons, MD  donepezil (ARICEPT) 5 MG tablet Take 5 mg by mouth daily. 01/09/16   [provider]  finasteride (PROSCAR) 5 MG tablet Take 5 mg by mouth daily.    [provider]  furosemide (LASIX) 20 MG tablet Take 1 tablet (20 mg total) by mouth every Monday, Wednesday, and Friday. 01/04/17   Florencia Reasons, MD  guaiFENesin (MUCINEX) 600 MG 12 hr tablet Take 1 tablet (600 mg total) by mouth 2 (two) times daily. 01/04/17   Florencia Reasons, MD  metoprolol succinate (TOPROL-XL) 50 MG 24 hr tablet Take 1 tablet (50 mg total) by mouth at bedtime. Patient is overdue for an appointment and needs to schedule for further refills 2nd attempt 10/01/17   Sherren Mocha, MD  Multiple Vitamin (MULTIVITAMIN WITH MINERALS) TABS tablet Take 1 tablet by mouth daily.    [provider]    Physical Exam: Patient Vitals for the past 24 hrs:  BP Temp Temp src Pulse Resp SpO2 Weight  10/02/17 1939 - -  - (!) 107 (!) 23 99 % -  10/02/17 1938 (!) 143/73 - - - (!) 27 - -  10/02/17 1745 - - - - - - 95.3 kg (210 lb)  10/02/17 1740 139/88 99.5 F (37.5 C) Oral (!) 114 20 99 % -    1. General:  in No Acute distress  well -appearing 2. Psychological: Alert and   Oriented to self 3. Head/ENT:    Dry Mucous Membranes  Head Non traumatic, neck supple                           Poor Dentition 4. SKIN:   decreased Skin turgor,  Skin clean Dry and intact no rash 5. Heart: Regular rate and rhythm no  Murmur, no Rub or gallop 6. Lungs:  no wheezes or crackles   7. Abdomen: Soft,  non-tender, Non distended   obese  bowel sounds present 8. Lower extremities: no clubbing, cyanosis, or edema 9. Neurologically Grossly intact, moving all 4 extremities equally   10. MSK: Normal range of motion   body mass index is 32.89 kg/m.  Labs on Admission:   Labs on Admission: I have personally reviewed following labs and imaging studies  CBC:  Recent Labs Lab 10/02/17 1832  WBC 26.3*  HGB 14.4  HCT 42.1  MCV 94.4  PLT 258   Basic Metabolic Panel:  Recent Labs Lab 10/02/17 1832  NA 128*  K 3.1*  CL 93*  CO2 22  GLUCOSE 196*  BUN 30*  CREATININE 2.12*  CALCIUM 8.6*   GFR: CrCl cannot be calculated (Unknown ideal weight.). Liver Function Tests:  Recent Labs Lab 10/02/17 1832  AST 48*  ALT 32  ALKPHOS 110  BILITOT 1.4*  PROT 7.8  ALBUMIN 3.3*    Recent Labs Lab 10/02/17 1832  LIPASE 45   No results for input(s): AMMONIA in the last 168 hours. Coagulation Profile:  Recent Labs Lab 10/02/17 2027  INR 1.21   Cardiac Enzymes:  Recent Labs Lab 10/02/17 1832  TROPONINI 1.01*   BNP (last 3 results) No results for input(s): PROBNP in the last 8760 hours. HbA1C: No results for input(s): HGBA1C in the last 72 hours. CBG: No results for input(s): GLUCAP in the last 168 hours. Lipid Profile: No results for input(s): CHOL, HDL, LDLCALC, TRIG,  CHOLHDL, LDLDIRECT in the last 72 hours. Thyroid Function Tests: No results for input(s): TSH, T4TOTAL, FREET4, T3FREE, THYROIDAB in the last 72 hours. Anemia Panel: No results for input(s): VITAMINB12, FOLATE, FERRITIN, TIBC, IRON, RETICCTPCT in the last 72 hours. Urine analysis: Sepsis Labs: @LABRCNTIP (procalcitonin:4,lacticidven:4) )No results found for this or any previous visit (from the past 240 hour(s)).    UA  not ordered  Lab Results  Component Value Date   HGBA1C 7.2 (H) 01/03/2017    CrCl cannot be calculated (Unknown ideal weight.).  BNP (last 3 results) No results for input(s): PROBNP in the last 8760 hours.   ECG REPORT  Independently reviewed Rate: 108  Rhythm: A.fib LBBB ST&T Change: NA    Filed Weights   10/02/17 1745  Weight: 95.3 kg (210 lb)     Cultures:    Component Value Date/Time   SDES BLOOD RIGHT WRIST 02/26/2016 2030   SPECREQUEST BOTTLES DRAWN AEROBIC AND ANAEROBIC 5CC BOTH 02/26/2016 2030   CULT NO GROWTH 5 DAYS 02/26/2016 2030   REPTSTATUS 03/02/2016 FINAL 02/26/2016 2030     Radiological Exams on Admission: Ct Head Wo Contrast  Result Date: 10/02/2017 CLINICAL DATA:  83 y/o  M; multiple falls.  History of stroke. EXAM: CT HEAD WITHOUT CONTRAST TECHNIQUE: Contiguous axial images were obtained from the base of the skull through the vertex without intravenous contrast. COMPARISON:  01/02/2017 MRI of the head. FINDINGS: Brain: No evidence of acute infarction, hemorrhage, hydrocephalus, extra-axial collection or mass lesion/mass effect. Areas of encephalomalacia in the right ACA distribution compatible with chronic infarction. Stable advanced chronic microvascular ischemic  changes and parenchymal volume loss of the brain given differences in technique. Vascular: Calcific atherosclerosis carotid siphons. No hyperdense vessel identified. Skull: Normal. Negative for fracture or focal lesion. Sinuses/Orbits: Mild maxillary and ethmoid sinus mucosal  thickening. Normal aeration of mastoid air cells. Bilateral intra-ocular lens replacement. Other: None. IMPRESSION: 1. No acute intracranial abnormality identified. 2. Areas of chronic infarction in right ACA distribution. 3. Advanced chronic microvascular ischemic changes and parenchymal volume loss of the brain. Electronically Signed   By: Kristine Garbe M.D.   On: 10/02/2017 20:51   Dg Chest Port 1 View  Result Date: 10/02/2017 CLINICAL DATA:  Status post falls. EXAM: PORTABLE CHEST 1 VIEW COMPARISON:  01/01/2017 FINDINGS: Cardiac silhouette is mildly enlarged. Calcific atherosclerotic disease of the aorta. Mediastinal contours appear intact. There is no evidence of focal airspace consolidation, pleural effusion or pneumothorax. Low lung volumes. Osseous structures are without acute abnormality. Soft tissues are grossly normal. IMPRESSION: Low lung volumes. No evidence of lobar consolidation or pulmonary edema. Calcific atherosclerotic disease of the aorta. Electronically Signed   By: Fidela Salisbury M.D.   On: 10/02/2017 20:20    Chart has been reviewed    Assessment/Plan  83 y.o. male with medical history significant of chronic atrial fibrillation, chronic diastolic heart failure, COPD, diabetes, and obesity, HTN, HLD, A.fib not on anticoagulation due to falls, OSA , DM2, dementia, chronic diastolic CHF Admitted for AKI, elevated troponin diarrhea and dehydration  Present on Admission: . Diarrhea given recent exposure to antibiotics we'll check for C differential as well as gastric panel patient states currently improving continue to monitor and follow-up studies . Hypokalemia - - will replace and repeat in AM,  check magnesium level and replace as needed  . Dehydration  - we'll administer IV fluids and recheck orthostatics in the morning  . Elevated troponin - he currently denies any chest pain possibly demand ischemia in the setting of dehydration and/or infection. Admitted  to telemetry continue to cycle cardiac enzymes obtain echogram cardiology consulted  . Dementia without behavioral disturbance - currently stable expect some degree of sundowning will continue her medications . COPD (chronic obstructive pulmonary disease) with chronic bronchitis ([place]) - currently appears to be stable continue home medications . Atrial fibrillation with RVR ([place]) -           - CHA2DS2 vas score 5  Not on anticoagulation secondary to Risk of Falls,     -  Rate control:  Currently controlled with  Toprolol, will continue    . Acute renal failure superimposed on stage 3 chronic kidney disease ([place]) likely secondary to dehydration in the setting of diarrhea hold Lasix gently rehydrate . Hyperlipidemia stable continue home medications . Sepsis ([place]) patient technically meeting sepsis criteria, diarrhea currently improving afebrile given possibility of C. difficile will be judicious use of antibiotics patient does not appear to be toxic obtain blood cultures hold off on antibiotics for tonight and monitor . Syncope - patient denies, then he given history of dementia unsure for good historian  given risk factor will admit rehydrate obtain CE, monitor on tele   . AKI (acute kidney injury) ([place]) most likely secondary to dehydration rehydrate and recheck   Other plan as per orders.  DVT prophylaxis:  SCD     Code Status:  FULL CODE  as per patient     Family Communication:   Family not  at  Bedside    Disposition Plan:    Back to current facility when stable  Would benefit from PT/OT eval prior to DC   ordered  Consults called: cardiology    Admission status:   inpatient      Level of care     tele         I have spent a total of 56 min on this admission    Galdino Hinchman 10/03/2017, 12:55 AM    [facility]  Pager [phone]   after 2 AM please page floor coverage PA If 7AM-7PM, please contact the day team taking care of the patient  [email or web address]    Password [email or web address]

## 2017-10-02 NOTE — ED Provider Notes (Signed)
[facility] DEPT Provider Note   CSN: [id number] Arrival date & time: 10/02/17  1736     History   Chief Complaint Chief Complaint  Patient presents with  . Abdominal Pain    HPI LINKEN MCGLOTHEN is a 83 y.o. male.  HPI Patient history of atrial fibrillation does not anticoagulation. Presents with son after multiple falls especially over the last few days. Unknown whether he struck his head. He also has had multiple loose stools and bowel incontinence. Denies any abdominal pain or chest pain. Wife is unable to take care of at home. Past Medical History:  Diagnosis Date  . Arthritis   . Chronic atrial fibrillation ([place])   . Diabetes mellitus without complication ([place])   . Hyperlipidemia   . Hypertension   . Sleep apnea    no cpap used, sleep study was several yrs ago  . Urinary retention foley last changed 3 weeks ago    Patient Active Problem List   Diagnosis Date Noted  . Diarrhea 10/02/2017  . Hypokalemia 10/02/2017  . Dehydration 10/02/2017  . Syncope 10/02/2017  . Cerebral embolism with cerebral infarction 01/04/2017  . Dementia without behavioral disturbance   . Falls 01/03/2017  . Generalized weakness 01/01/2017  . Frequent falls 01/01/2017  . Acute hypoxemic respiratory failure ([place]) 04/09/2016  . COPD (chronic obstructive pulmonary disease) with chronic bronchitis ([place]) 03/12/2016  . HCAP (healthcare-associated pneumonia) 02/26/2016  . Elevated troponin 02/26/2016  . Elevated lactic acid level 01/28/2016  . Sepsis ([place]) 01/28/2016  . Acute renal failure superimposed on stage 3 chronic kidney disease ([place]) 04/21/2015  . Hyperglycemia 04/21/2015  . Bronchitis 04/21/2015  . Tachycardia 04/21/2015  . BPH (benign prostatic hypertrophy) 04/21/2015  . Dyspnea 12/28/2012  . Fall 12/28/2012  . OTHER DYSPNEA AND RESPIRATORY ABNORMALITIES 05/27/2010  . Hyperlipidemia 11/27/2008  . HYPERTENSION, BENIGN 11/27/2008  . Atrial fibrillation with RVR ([place])  11/27/2008  . GERD 11/27/2008  . BPH/LUTS W/O OBSTRUCTION 11/27/2008  . ARTHRITIS 11/27/2008    Past Surgical History:  Procedure Laterality Date  . BACK SURGERY  yrs ago   lower back surgery  . PROSTATE SURGERY  yrs ago, no cancer  . TONSILLECTOMY  as child  . TRANSURETHRAL RESECTION OF PROSTATE  02/22/2012   Procedure: TRANSURETHRAL RESECTION OF THE PROSTATE WITH GYRUS INSTRUMENTS;  Surgeon: Molli Hazard, MD;  Location: [facility];  Service: Urology;  Laterality: N/A;           Home Medications    Prior to Admission medications   Medication Sig Start Date End Date Taking? Authorizing Provider  amoxicillin-clavulanate (AUGMENTIN) 875-125 MG tablet Take 1 tablet by mouth 2 (two) times daily. Patient taking differently: Take 1 tablet by mouth 2 (two) times daily. 14 day course filled 09/14/17 - for foot infection 09/14/17  Yes Stover, Titorya, DPM  aspirin 325 MG tablet Take 1 tablet (325 mg total) by mouth daily. Patient taking differently: Take 325 mg by mouth daily as needed for headache (pain).  01/05/17  Yes Florencia Reasons, MD  donepezil (ARICEPT) 5 MG tablet Take 5 mg by mouth at bedtime.  01/09/16  Yes [provider]  finasteride (PROSCAR) 5 MG tablet Take 5 mg by mouth at bedtime.    Yes [provider]  Melatonin 10 MG TABS Take 10 mg by mouth at bedtime.   Yes [provider]  metoprolol succinate (TOPROL-XL) 50 MG 24 hr tablet Take 1 tablet (50 mg total) by mouth at bedtime. Patient is overdue for  an appointment and needs to schedule for further refills 2nd attempt 10/01/17  Yes Sherren Mocha, MD  Multiple Vitamin (MULTIVITAMIN WITH MINERALS) TABS tablet Take 1 tablet by mouth daily.   Yes [provider]  OVER THE COUNTER MEDICATION Apply 1 application topically See admin instructions. Apply over the counter diaper rash cream topically as needed for wound care   Yes [provider]  atorvastatin (LIPITOR) 20 MG tablet Take 1  tablet (20 mg total) by mouth daily at 6 PM. Patient not taking: Reported on 10/02/2017 01/04/17   Florencia Reasons, MD  furosemide (LASIX) 20 MG tablet Take 1 tablet (20 mg total) by mouth every Monday, Wednesday, and Friday. Patient not taking: Reported on 10/02/2017 01/04/17   Florencia Reasons, MD  guaiFENesin (MUCINEX) 600 MG 12 hr tablet Take 1 tablet (600 mg total) by mouth 2 (two) times daily. Patient not taking: Reported on 10/02/2017 01/04/17   Florencia Reasons, MD    Family History Family History  Problem Relation Age of Onset  . Heart attack Father     Social History Social History  Substance Use Topics  . Smoking status: Former Smoker    Packs/day: 0.50    Years: 50.00    Types: Cigarettes    Quit date: 12/21/1980  . Smokeless tobacco: Never Used  . Alcohol use 0.0 oz/week     Comment: rare use     Allergies   Sulfa antibiotics   Review of Systems Review of Systems  Constitutional: Positive for fatigue. Negative for chills and fever.  HENT: Negative for facial swelling.   Respiratory: Negative for cough and shortness of breath.   Cardiovascular: Positive for leg swelling. Negative for chest pain.  Gastrointestinal: Positive for diarrhea. Negative for abdominal pain, blood in stool and vomiting.  Genitourinary: Negative for dysuria, flank pain and frequency.  Musculoskeletal: Positive for gait problem. Negative for back pain, myalgias, neck pain and neck stiffness.  Skin: Negative for rash and wound.  Neurological: Positive for weakness (generalized). Negative for dizziness, syncope, light-headedness, numbness and headaches.  Psychiatric/Behavioral: Positive for confusion.  All other systems reviewed and are negative.    Physical Exam Updated Vital Signs BP 109/62   Pulse (!) 103   Temp 99.5 F (37.5 C) (Oral)   Resp (!) 25   Wt 95.3 kg (210 lb)   SpO2 92%   BMI 32.89 kg/m   Physical Exam  Constitutional: He appears well-developed and well-nourished. No distress.  HENT:    Head: Normocephalic and atraumatic.  Mouth/Throat: Oropharynx is clear and moist. No oropharyngeal exudate.  Eyes: Pupils are equal, round, and reactive to light. EOM are normal.  Neck: Normal range of motion. Neck supple.  No posterior midline cervical tenderness to palpation.  Cardiovascular:  Tachycardia. Irregularly irregular.  Pulmonary/Chest: Effort normal. He has rales.  Few rales in bilateral bases  Abdominal: Soft. Bowel sounds are normal. There is no tenderness. There is no rebound and no guarding.  Musculoskeletal: Normal range of motion. He exhibits no edema or tenderness.  no midline thoracic or lumbar tenderness. Bilateral lower extremity hyperpigmentation. The tips of the patient's toes are cool with delayed cap refill. Unable to palpate dorsalis pedis on the left but dopplerable pulses present.  Neurological: He is alert.  Oriented to self. Moving all extremities without focal deficit. Sensation fully intact.  Skin: Skin is warm and dry. Capillary refill takes more than 3 seconds. No rash noted. No erythema.  Psychiatric: He has a normal mood and affect.  His behavior is normal.  Nursing note and vitals reviewed.    ED Treatments / Results  Labs (all labs ordered are listed, but only abnormal results are displayed) Labs Reviewed  COMPREHENSIVE METABOLIC PANEL - Abnormal; Notable for the following:       Result Value   Sodium 128 (*)    Potassium 3.1 (*)    Chloride 93 (*)    Glucose, Bld 196 (*)    BUN 30 (*)    Creatinine, Ser 2.12 (*)    Calcium 8.6 (*)    Albumin 3.3 (*)    AST 48 (*)    Total Bilirubin 1.4 (*)    GFR calc non Af Amer 26 (*)    GFR calc Af Amer 30 (*)    All other components within normal limits  CBC - Abnormal; Notable for the following:    WBC 26.3 (*)    All other components within normal limits  TROPONIN I - Abnormal; Notable for the following:    Troponin I 1.01 (*)    All other components within normal limits  BRAIN NATRIURETIC  PEPTIDE - Abnormal; Notable for the following:    B Natriuretic Peptide 122.8 (*)    All other components within normal limits  C DIFFICILE QUICK SCREEN W PCR REFLEX  GASTROINTESTINAL PANEL BY PCR, STOOL (REPLACES STOOL CULTURE)  CULTURE, BLOOD (ROUTINE X 2)  CULTURE, BLOOD (ROUTINE X 2)  LIPASE, BLOOD  PROTIME-INR  APTT  URINALYSIS, ROUTINE W REFLEX MICROSCOPIC  LACTIC ACID, PLASMA  LACTIC ACID, PLASMA  PROCALCITONIN  TROPONIN I  TROPONIN I  TROPONIN I  CREATININE, URINE, RANDOM  SODIUM, URINE, RANDOM  OSMOLALITY, URINE  MAGNESIUM  PHOSPHORUS    EKG  EKG Interpretation  Date/Time:  Saturday October 02 2017 17:51:22 EDT Ventricular Rate:  108 PR Interval:    QRS Duration: 76 QT Interval:  324 QTC Calculation: 434 R Axis:   24 Text Interpretation:  Atrial fibrillation with rapid ventricular response Low voltage QRS Cannot rule out Anterior infarct , age undetermined Abnormal ECG Confirmed by Julianne Rice [phone]) on 10/02/2017 7:46:31 PM       Radiology Ct Head Wo Contrast  Result Date: 10/02/2017 CLINICAL DATA:  83 y/o  M; multiple falls.  History of stroke. EXAM: CT HEAD WITHOUT CONTRAST TECHNIQUE: Contiguous axial images were obtained from the base of the skull through the vertex without intravenous contrast. COMPARISON:  01/02/2017 MRI of the head. FINDINGS: Brain: No evidence of acute infarction, hemorrhage, hydrocephalus, extra-axial collection or mass lesion/mass effect. Areas of encephalomalacia in the right ACA distribution compatible with chronic infarction. Stable advanced chronic microvascular ischemic changes and parenchymal volume loss of the brain given differences in technique. Vascular: Calcific atherosclerosis carotid siphons. No hyperdense vessel identified. Skull: Normal. Negative for fracture or focal lesion. Sinuses/Orbits: Mild maxillary and ethmoid sinus mucosal thickening. Normal aeration of mastoid air cells. Bilateral intra-ocular lens  replacement. Other: None. IMPRESSION: 1. No acute intracranial abnormality identified. 2. Areas of chronic infarction in right ACA distribution. 3. Advanced chronic microvascular ischemic changes and parenchymal volume loss of the brain. Electronically Signed   By: Kristine Garbe M.D.   On: 10/02/2017 20:51   Dg Chest Port 1 View  Result Date: 10/02/2017 CLINICAL DATA:  Status post falls. EXAM: PORTABLE CHEST 1 VIEW COMPARISON:  01/01/2017 FINDINGS: Cardiac silhouette is mildly enlarged. Calcific atherosclerotic disease of the aorta. Mediastinal contours appear intact. There is no evidence of focal airspace consolidation, pleural effusion or pneumothorax. Low  lung volumes. Osseous structures are without acute abnormality. Soft tissues are grossly normal. IMPRESSION: Low lung volumes. No evidence of lobar consolidation or pulmonary edema. Calcific atherosclerotic disease of the aorta. Electronically Signed   By: Fidela Salisbury M.D.   On: 10/02/2017 20:20    Procedures Procedures (including critical care time)  Medications Ordered in ED Medications  potassium chloride SA (K-DUR,KLOR-CON) CR tablet 20 mEq (not administered)     Initial Impression / Assessment and Plan / ED Course  I have reviewed the triage vital signs and the nursing notes.  Pertinent labs & imaging results that were available during my care of the patient were reviewed by me and considered in my medical decision making (see chart for details).     Patient with elevation in troponin without EKG changes. Discussed with cardiologist who will see patient in consult. Will admit to hospitalist regarding frequent falls, diarrhea. Possible C diff.  Final Clinical Impressions(s) / ED Diagnoses   Final diagnoses:  Elevated troponin  Dehydration  Hypokalemia  Diarrhea, unspecified type    New Prescriptions New Prescriptions   No medications on file     Julianne Rice, MD 10/02/17 2304

## 2017-10-02 NOTE — Consult Note (Signed)
Reason for Consult: + trop   Requesting Physician/Service: Toy Baker, MD [facility]    PCP:  Haywood Pao, MD  Primary Cardiologist: Dr. Burt Knack  HPI: [name] [name] is a 83 y.o. male with a history of chronic atrial fibrillation (not on Momence given unsteadiness and non compliance), chronic diastolic CHF, CKD, COPD, DMT2, HTN, HLD, CVA, dementia and obesity who presented to [facility] on 10/02/17 with increasing falls, syncope, diarrhea, abdomen pain, and confusion.   Reports several days of diarrhea with abdomen pain.  No CP, SOB, edema.  Labs in ED reflect this, noting some AKI, low potassium.  Trop was checked and + to 1.01.  We are asked to see given elevated troponin.    On interview, as above no active symptoms.  No CP, SOB, edema.  He is pleasant, but has dementia/confusion.  History is limited.    Previous Cardiac Studies: Echo: 01/04/2017 LV EF: 60% - 65% Study Conclusions - Left ventricle: The cavity size was normal. Wall thickness was increased in a pattern of mild LVH. Systolic function was normal. The estimated ejection fraction was in the range of 60% to 65%. - Aortic valve: There was mild regurgitation. - Mitral valve: There was mild regurgitation. - Left atrium: The atrium was mildly dilated. - Right ventricle: The cavity size was mildly dilated. - Right atrium: The atrium was mildly dilated.   Past Medical History:  Diagnosis Date  . Arthritis   . Chronic atrial fibrillation ([place])   . Diabetes mellitus without complication ([place])   . Hyperlipidemia   . Hypertension   . Sleep apnea    no cpap used, sleep study was several yrs ago  . Urinary retention foley last changed 3 weeks ago    Past Surgical History:  Procedure Laterality Date  . BACK SURGERY  yrs ago   lower back surgery  . PROSTATE SURGERY  yrs ago, no cancer  . TONSILLECTOMY  as child  . TRANSURETHRAL RESECTION OF PROSTATE  02/22/2012   Procedure: TRANSURETHRAL RESECTION OF THE  PROSTATE WITH GYRUS INSTRUMENTS;  Surgeon: Molli Hazard, MD;  Location: [facility];  Service: Urology;  Laterality: N/A;        Family History  Problem Relation Age of Onset  . Heart attack Father    Social History:  reports that he quit smoking about 36 years ago. His smoking use included Cigarettes. He has a 25.00 pack-year smoking history. He has never used smokeless tobacco. He reports that he drinks alcohol. He reports that he does not use drugs.  Allergies:  Allergies  Allergen Reactions  . Sulfa Antibiotics Swelling    Swelling of hands    No current facility-administered medications on file prior to encounter.    Current Outpatient Prescriptions on File Prior to Encounter  Medication Sig Dispense Refill  . amoxicillin-clavulanate (AUGMENTIN) 875-125 MG tablet Take 1 tablet by mouth 2 (two) times daily. (Patient taking differently: Take 1 tablet by mouth 2 (two) times daily. 14 day course filled 09/14/17 - for foot infection) 28 tablet 0  . aspirin 325 MG tablet Take 1 tablet (325 mg total) by mouth daily. (Patient taking differently: Take 325 mg by mouth daily as needed for headache (pain). ) 30 tablet 0  . donepezil (ARICEPT) 5 MG tablet Take 5 mg by mouth at bedtime.   6  . finasteride (PROSCAR) 5 MG tablet Take 5 mg by mouth at bedtime.     . metoprolol succinate (TOPROL-XL) 50 MG 24  hr tablet Take 1 tablet (50 mg total) by mouth at bedtime. Patient is overdue for an appointment and needs to schedule for further refills 2nd attempt 15 tablet 0  . Multiple Vitamin (MULTIVITAMIN WITH MINERALS) TABS tablet Take 1 tablet by mouth daily.    Marland Kitchen atorvastatin (LIPITOR) 20 MG tablet Take 1 tablet (20 mg total) by mouth daily at 6 PM. (Patient not taking: Reported on 10/02/2017) 30 tablet 0  . furosemide (LASIX) 20 MG tablet Take 1 tablet (20 mg total) by mouth every Monday, Wednesday, and Friday. (Patient not taking: Reported on 10/02/2017) 90 tablet 3  . guaiFENesin (MUCINEX) 600  MG 12 hr tablet Take 1 tablet (600 mg total) by mouth 2 (two) times daily. (Patient not taking: Reported on 10/02/2017) 30 tablet 0    '@medshecduled'$ @ '@medinfusions'$ @  Results for orders placed or performed during the hospital encounter of 10/02/17 (from the past 48 hour(s))  Lipase, blood     Status: None   Collection Time: 10/02/17  6:32 PM  Result Value Ref Range   Lipase 45 11 - 51 U/L  Comprehensive metabolic panel     Status: Abnormal   Collection Time: 10/02/17  6:32 PM  Result Value Ref Range   Sodium 128 (L) 135 - 145 mmol/L   Potassium 3.1 (L) 3.5 - 5.1 mmol/L   Chloride 93 (L) 101 - 111 mmol/L   CO2 22 22 - 32 mmol/L   Glucose, Bld 196 (H) 65 - 99 mg/dL   BUN 30 (H) 6 - 20 mg/dL   Creatinine, Ser 2.12 (H) 0.61 - 1.24 mg/dL   Calcium 8.6 (L) 8.9 - 10.3 mg/dL   Total Protein 7.8 6.5 - 8.1 g/dL   Albumin 3.3 (L) 3.5 - 5.0 g/dL   AST 48 (H) 15 - 41 U/L   ALT 32 17 - 63 U/L   Alkaline Phosphatase 110 38 - 126 U/L   Total Bilirubin 1.4 (H) 0.3 - 1.2 mg/dL   GFR calc non Af Amer 26 (L) >60 mL/min   GFR calc Af Amer 30 (L) >60 mL/min    Comment: (NOTE) The eGFR has been calculated using the CKD EPI equation. This calculation has not been validated in all clinical situations. eGFR's persistently <60 mL/min signify possible Chronic Kidney Disease.    Anion gap 13 5 - 15  CBC     Status: Abnormal   Collection Time: 10/02/17  6:32 PM  Result Value Ref Range   WBC 26.3 (H) 4.0 - 10.5 K/uL   RBC 4.46 4.22 - 5.81 MIL/uL   Hemoglobin 14.4 13.0 - 17.0 g/dL   HCT 42.1 39.0 - 52.0 %   MCV 94.4 78.0 - 100.0 fL   MCH 32.3 26.0 - 34.0 pg   MCHC 34.2 30.0 - 36.0 g/dL   RDW 13.7 11.5 - 15.5 %   Platelets 231 150 - 400 K/uL  Troponin I     Status: Abnormal   Collection Time: 10/02/17  6:32 PM  Result Value Ref Range   Troponin I 1.01 (HH) <0.03 ng/mL    Comment: CRITICAL RESULT CALLED TO, READ BACK BY AND VERIFIED WITH: A.DENNIS, RN 10.13.18 1922   Brain natriuretic peptide      Status: Abnormal   Collection Time: 10/02/17  8:27 PM  Result Value Ref Range   B Natriuretic Peptide 122.8 (H) 0.0 - 100.0 pg/mL  Protime-INR     Status: None   Collection Time: 10/02/17  8:27 PM  Result Value Ref  Range   Prothrombin Time 15.2 11.4 - 15.2 seconds   INR 1.21   APTT     Status: None   Collection Time: 10/02/17  8:27 PM  Result Value Ref Range   aPTT 31 24 - 36 seconds   Ct Head Wo Contrast  Result Date: 10/02/2017 CLINICAL DATA:  83 y/o  M; multiple falls.  History of stroke. EXAM: CT HEAD WITHOUT CONTRAST TECHNIQUE: Contiguous axial images were obtained from the base of the skull through the vertex without intravenous contrast. COMPARISON:  01/02/2017 MRI of the head. FINDINGS: Brain: No evidence of acute infarction, hemorrhage, hydrocephalus, extra-axial collection or mass lesion/mass effect. Areas of encephalomalacia in the right ACA distribution compatible with chronic infarction. Stable advanced chronic microvascular ischemic changes and parenchymal volume loss of the brain given differences in technique. Vascular: Calcific atherosclerosis carotid siphons. No hyperdense vessel identified. Skull: Normal. Negative for fracture or focal lesion. Sinuses/Orbits: Mild maxillary and ethmoid sinus mucosal thickening. Normal aeration of mastoid air cells. Bilateral intra-ocular lens replacement. Other: None. IMPRESSION: 1. No acute intracranial abnormality identified. 2. Areas of chronic infarction in right ACA distribution. 3. Advanced chronic microvascular ischemic changes and parenchymal volume loss of the brain. Electronically Signed   By: Kristine Garbe M.D.   On: 10/02/2017 20:51   Dg Chest Port 1 View  Result Date: 10/02/2017 CLINICAL DATA:  Status post falls. EXAM: PORTABLE CHEST 1 VIEW COMPARISON:  01/01/2017 FINDINGS: Cardiac silhouette is mildly enlarged. Calcific atherosclerotic disease of the aorta. Mediastinal contours appear intact. There is no evidence of  focal airspace consolidation, pleural effusion or pneumothorax. Low lung volumes. Osseous structures are without acute abnormality. Soft tissues are grossly normal. IMPRESSION: Low lung volumes. No evidence of lobar consolidation or pulmonary edema. Calcific atherosclerotic disease of the aorta. Electronically Signed   By: Fidela Salisbury M.D.   On: 10/02/2017 20:20    ECG/TELE:  Afib, low voltage, rate 108 -- incomplete RBBB  ROS: As above. Otherwise, review of systems is negative unless per above HPI  Vitals:   10/02/17 2000 10/02/17 2100 10/02/17 2130 10/02/17 2200  BP: 128/82 122/77 114/66 109/62  Pulse: 61 95 (!) 108 (!) 103  Resp: (!) 25 (!) 21 15 (!) 25  Temp:      TempSrc:      SpO2: 92% 96% 95% 92%  Weight:       Wt Readings from Last 10 Encounters:  10/02/17 95.3 kg (210 lb)  01/02/17 95.3 kg (210 lb 1.6 oz)  05/25/16 94.7 kg (208 lb 12.8 oz)  04/09/16 94.6 kg (208 lb 9.6 oz)  03/17/16 93 kg (205 lb)  03/12/16 93.4 kg (206 lb)  03/10/16 93 kg (205 lb)  03/03/16 89.8 kg (198 lb)  03/02/16 89.8 kg (198 lb)  02/01/16 104.8 kg (231 lb 0.7 oz)    PE:  General: No acute distress HEENT: Atraumatic, EOMI, mucous membranes moist. No JVD at 35 degrees. No HJR. CV: Irr Irr rhythm, no murmurs, gallops.  Respiratory: Clear, no crackles. Normal work of breathing ABD: Non-distended and non-tender. No palpable organomegaly.  Extremities: 2+ radial pulses bilaterally. 1+ edema. Neuro/Psych: CN grossly intact, alert and oriented  Assessment/Plan + Trop (likely demand)   Diarrhea with hypokalemia, AKI Afib not on AC Hyperlipidemia HYPERTENSION, BENIGN Generalized weakness Falls  No ischemic symptoms or findings.  Elevated troponin unlikely due to ACS given presentation of diarrhea with hypokalemia, AKI.  Would conservatively manage with correction of electrolyte abnormalities. Continue chronic aspirin, lipitor, Toprol.  Reasonable to hold lasix given diarrhea.   Lolita Cram Stacie Knutzen  MD 10/02/2017, 11:18 PM

## 2017-10-02 NOTE — ED Notes (Signed)
ED Provider at bedside. 

## 2017-10-03 ENCOUNTER — Encounter (HOSPITAL_COMMUNITY): Payer: Self-pay | Admitting: Internal Medicine

## 2017-10-03 DIAGNOSIS — R197 Diarrhea, unspecified: Secondary | ICD-10-CM | POA: Diagnosis not present

## 2017-10-03 DIAGNOSIS — J449 Chronic obstructive pulmonary disease, unspecified: Secondary | ICD-10-CM | POA: Diagnosis not present

## 2017-10-03 DIAGNOSIS — N183 Chronic kidney disease, stage 3 (moderate): Secondary | ICD-10-CM | POA: Diagnosis not present

## 2017-10-03 DIAGNOSIS — E1122 Type 2 diabetes mellitus with diabetic chronic kidney disease: Secondary | ICD-10-CM | POA: Diagnosis not present

## 2017-10-03 DIAGNOSIS — N179 Acute kidney failure, unspecified: Secondary | ICD-10-CM | POA: Diagnosis not present

## 2017-10-03 DIAGNOSIS — I4891 Unspecified atrial fibrillation: Secondary | ICD-10-CM | POA: Diagnosis not present

## 2017-10-03 DIAGNOSIS — F0390 Unspecified dementia without behavioral disturbance: Secondary | ICD-10-CM | POA: Diagnosis not present

## 2017-10-03 DIAGNOSIS — I130 Hypertensive heart and chronic kidney disease with heart failure and stage 1 through stage 4 chronic kidney disease, or unspecified chronic kidney disease: Secondary | ICD-10-CM | POA: Diagnosis not present

## 2017-10-03 LAB — COMPREHENSIVE METABOLIC PANEL
ALBUMIN: 3.1 g/dL — AB (ref 3.5–5.0)
ALT: 32 U/L (ref 17–63)
AST: 32 U/L (ref 15–41)
Alkaline Phosphatase: 89 U/L (ref 38–126)
Anion gap: 12 (ref 5–15)
BILIRUBIN TOTAL: 1.7 mg/dL — AB (ref 0.3–1.2)
BUN: 28 mg/dL — AB (ref 6–20)
CHLORIDE: 100 mmol/L — AB (ref 101–111)
CO2: 22 mmol/L (ref 22–32)
CREATININE: 1.78 mg/dL — AB (ref 0.61–1.24)
Calcium: 8.7 mg/dL — ABNORMAL LOW (ref 8.9–10.3)
GFR calc Af Amer: 37 mL/min — ABNORMAL LOW (ref >60)
GFR, EST NON AFRICAN AMERICAN: 32 mL/min — AB (ref >60)
Glucose, Bld: 175 mg/dL — ABNORMAL HIGH (ref 65–99)
Potassium: 3.4 mmol/L — ABNORMAL LOW (ref 3.5–5.1)
Sodium: 134 mmol/L — ABNORMAL LOW (ref 135–145)
TOTAL PROTEIN: 6.9 g/dL (ref 6.5–8.1)

## 2017-10-03 LAB — GLUCOSE, CAPILLARY
GLUCOSE-CAPILLARY: 164 mg/dL — AB (ref 65–99)
GLUCOSE-CAPILLARY: 183 mg/dL — AB (ref 65–99)
Glucose-Capillary: 124 mg/dL — ABNORMAL HIGH (ref 65–99)
Glucose-Capillary: 141 mg/dL — ABNORMAL HIGH (ref 65–99)
Glucose-Capillary: 147 mg/dL — ABNORMAL HIGH (ref 65–99)
Glucose-Capillary: 159 mg/dL — ABNORMAL HIGH (ref 65–99)

## 2017-10-03 LAB — URINALYSIS, ROUTINE W REFLEX MICROSCOPIC
BILIRUBIN URINE: NEGATIVE
Glucose, UA: NEGATIVE mg/dL
Hgb urine dipstick: NEGATIVE
Ketones, ur: 5 mg/dL — AB
Leukocytes, UA: NEGATIVE
Nitrite: NEGATIVE
PROTEIN: 100 mg/dL — AB
SPECIFIC GRAVITY, URINE: 1.017 (ref 1.005–1.030)
SQUAMOUS EPITHELIAL / LPF: NONE SEEN
pH: 6 (ref 5.0–8.0)

## 2017-10-03 LAB — TSH: TSH: 2.255 u[IU]/mL (ref 0.350–4.500)

## 2017-10-03 LAB — HEMOGLOBIN A1C
Hgb A1c MFr Bld: 7 % — ABNORMAL HIGH (ref 4.8–5.6)
Mean Plasma Glucose: 154.2 mg/dL

## 2017-10-03 LAB — MAGNESIUM
Magnesium: 2 mg/dL (ref 1.7–2.4)
Magnesium: 2 mg/dL (ref 1.7–2.4)

## 2017-10-03 LAB — LACTIC ACID, PLASMA
LACTIC ACID, VENOUS: 1.3 mmol/L (ref 0.5–1.9)
Lactic Acid, Venous: 1.3 mmol/L (ref 0.5–1.9)

## 2017-10-03 LAB — CBC
HCT: 38.9 % — ABNORMAL LOW (ref 39.0–52.0)
Hemoglobin: 13.5 g/dL (ref 13.0–17.0)
MCH: 32.4 pg (ref 26.0–34.0)
MCHC: 34.7 g/dL (ref 30.0–36.0)
MCV: 93.3 fL (ref 78.0–100.0)
PLATELETS: 178 10*3/uL (ref 150–400)
RBC: 4.17 MIL/uL — ABNORMAL LOW (ref 4.22–5.81)
RDW: 13.3 % (ref 11.5–15.5)
WBC: 20 10*3/uL — AB (ref 4.0–10.5)

## 2017-10-03 LAB — TROPONIN I
TROPONIN I: 0.03 ng/mL — AB (ref <0.03)
Troponin I: <0.03 ng/mL (ref <0.03)

## 2017-10-03 LAB — OSMOLALITY, URINE: OSMOLALITY UR: 554 mosm/kg (ref 300–900)

## 2017-10-03 LAB — CREATININE, URINE, RANDOM: Creatinine, Urine: 140.24 mg/dL

## 2017-10-03 LAB — PHOSPHORUS
Phosphorus: 2.8 mg/dL (ref 2.5–4.6)
Phosphorus: 2.8 mg/dL (ref 2.5–4.6)

## 2017-10-03 LAB — PROCALCITONIN: Procalcitonin: 0.57 ng/mL

## 2017-10-03 LAB — SODIUM, URINE, RANDOM: SODIUM UR: 16 mmol/L

## 2017-10-03 MED ORDER — DEXTROSE 5 % IV SOLN
1.0000 g | INTRAVENOUS | Status: DC
  Administered 2017-10-03 – 2017-10-04 (×2): 1 g via INTRAVENOUS
  Filled 2017-10-03 (×3): qty 10

## 2017-10-03 MED ORDER — METOPROLOL SUCCINATE ER 50 MG PO TB24
50.0000 mg | ORAL_TABLET | Freq: Every day | ORAL | Status: DC
  Administered 2017-10-03 – 2017-10-05 (×3): 50 mg via ORAL
  Filled 2017-10-03 (×3): qty 1

## 2017-10-03 MED ORDER — ENOXAPARIN SODIUM 30 MG/0.3ML ~~LOC~~ SOLN
30.0000 mg | Freq: Every day | SUBCUTANEOUS | Status: DC
  Administered 2017-10-03 – 2017-10-06 (×4): 30 mg via SUBCUTANEOUS
  Filled 2017-10-03 (×4): qty 0.3

## 2017-10-03 MED ORDER — ONDANSETRON HCL 4 MG PO TABS: 4.0000 mg | ORAL_TABLET | Freq: Four times a day (QID) | ORAL | Status: DC | PRN

## 2017-10-03 MED ORDER — SODIUM CHLORIDE 0.9 % IV SOLN
INTRAVENOUS | Status: DC
  Administered 2017-10-03 – 2017-10-05 (×2): via INTRAVENOUS

## 2017-10-03 MED ORDER — DONEPEZIL HCL 5 MG PO TABS
5.0000 mg | ORAL_TABLET | Freq: Every day | ORAL | Status: DC
  Administered 2017-10-03 – 2017-10-05 (×3): 5 mg via ORAL
  Filled 2017-10-03 (×3): qty 1

## 2017-10-03 MED ORDER — FINASTERIDE 5 MG PO TABS
5.0000 mg | ORAL_TABLET | Freq: Every day | ORAL | Status: DC
  Administered 2017-10-03 – 2017-10-05 (×3): 5 mg via ORAL
  Filled 2017-10-03 (×4): qty 1

## 2017-10-03 MED ORDER — INSULIN ASPART 100 UNIT/ML ~~LOC~~ SOLN
0.0000 [IU] | SUBCUTANEOUS | Status: DC
  Administered 2017-10-03: 1 [IU] via SUBCUTANEOUS
  Administered 2017-10-03 (×3): 2 [IU] via SUBCUTANEOUS
  Administered 2017-10-03 – 2017-10-04 (×3): 1 [IU] via SUBCUTANEOUS
  Administered 2017-10-04: 2 [IU] via SUBCUTANEOUS
  Administered 2017-10-04 – 2017-10-05 (×2): 1 [IU] via SUBCUTANEOUS
  Administered 2017-10-05: 2 [IU] via SUBCUTANEOUS
  Administered 2017-10-05: 1 [IU] via SUBCUTANEOUS
  Administered 2017-10-05: 3 [IU] via SUBCUTANEOUS

## 2017-10-03 MED ORDER — SODIUM CHLORIDE 0.9 % IV SOLN
INTRAVENOUS | Status: AC
  Administered 2017-10-03: 03:00:00 via INTRAVENOUS

## 2017-10-03 MED ORDER — HYDROCODONE-ACETAMINOPHEN 5-325 MG PO TABS
1.0000 | ORAL_TABLET | ORAL | Status: DC | PRN
  Administered 2017-10-03: 2 via ORAL
  Filled 2017-10-03: qty 2

## 2017-10-03 MED ORDER — ACETAMINOPHEN 650 MG RE SUPP: 650.0000 mg | Freq: Four times a day (QID) | RECTAL | Status: DC | PRN

## 2017-10-03 MED ORDER — ONDANSETRON HCL 4 MG/2ML IJ SOLN: 4.0000 mg | Freq: Four times a day (QID) | INTRAMUSCULAR | Status: DC | PRN

## 2017-10-03 MED ORDER — ACETAMINOPHEN 325 MG PO TABS
650.0000 mg | ORAL_TABLET | Freq: Four times a day (QID) | ORAL | Status: DC | PRN
  Administered 2017-10-03: 650 mg via ORAL
  Filled 2017-10-03: qty 2

## 2017-10-03 NOTE — Progress Notes (Signed)
Consult note reviewed - initial troponin was 1.01, however, this value was corrected to 0.03. Additional troponins have been borderline negative. BNP is low. According to Dr. Lamona Curl, he has had no symptoms to suggest ACS. Not felt to be a candidate for anticoagulation by Dr. Burt Knack beyond aspirin. Echo in 12/2016 showed EF 60-65%.  No further cardiac suggestions at this time. Cardiology will sign-off. Call with questions.  Pixie Casino, MD, [place]  Attending Cardiologist  Direct Dial: [phone]  Fax: [phone]  Website:  [email or web address]

## 2017-10-03 NOTE — Progress Notes (Signed)
PROGRESS NOTE    [name] [name]  CVE:[id number] DOB: 10-14-1926 DOA: 10/02/2017 PCP: Haywood Pao, MD   Brief Narrative: 83 year old male with history of chronic atrial fibrillation, chronic diastolic congestive heart failure, COPD, diabetes, obesity, hypertension, hyperlipidemia, dementia presented with abdominal pain nausea vomiting and diarrhea. Review of systems Limited because of patient's dementia. Admitted for dehydration.  Assessment & Plan:  # Diarrhea likely viral gastroenteritis: Patient reported no bowel movement today. No nausea or vomiting. Abdomen exam benign. Continue cardiac diet and provide supportive care.  #Hypokalemia: Replete potassium chloride. Magnesium level acceptable. Monitor labs.  #Elevated troponin likely demand ischemia: Patient with no chest pain or shortness of breath. Mild elevation in troponin likely due to reduced GFR. Evaluated by cardiology. Echocardiogram in January 2018 with EF of 60-65%.  #Dementia without behavioral disturbance: Continue supportive care. Watch for delirium. PT OT evaluation. Continue Aricept  #COPD with chronic bronchitis: Currently stable. Continue to monitor.  #Chronic atrial fibrillation on anticoagulation: Continue metoprolol.  #Acute kidney injury on chronic kidney disease stage III: Likely in the setting of diarrhea and dehydration. Continue IV fluid. Monitor BMP. Serum creatinine level improving.  #Hyperlipidemia: Continue home medication.  #Possible acute cystitis without hematuria: UA with pyuria. Given mild temperature and leukocytosis I will start IV subtraction. Follow-up urine cultures.  I do not think patient is septic.   DVT prophylaxis: Lovenox subcutaneous Code Status: Full code Family Communication: No family at bedside Disposition Plan: Currently admitted    Consultants:   Cardiologist  Procedures: None Antimicrobials: Ceftriaxone since October 14  Subjective: Seen and examined at  bedside. Denied headache, dizziness, nausea vomiting chest or shortness of breath.  Objective: Vitals:   10/03/17 0240 10/03/17 0316 10/03/17 0409 10/03/17 1100  BP: 127/76  120/68 123/80  Pulse: (!) 102  95   Resp: (!) 22  20 16   Temp: 99.1 F (37.3 C)  100.2 F (37.9 C) 98.7 F (37.1 C)  TempSrc: Oral  Oral Oral  SpO2: 96%  96% 97%  Weight:  87 kg (191 lb 12.8 oz)    Height: 5\' 5"  (1.651 m)       Intake/Output Summary (Last 24 hours) at 10/03/17 1447 Last data filed at 10/03/17 0644  Gross per 24 hour  Intake              240 ml  Output              100 ml  Net              140 ml   Filed Weights   10/02/17 1745 10/03/17 0316  Weight: 95.3 kg (210 lb) 87 kg (191 lb 12.8 oz)    Examination:  General exam: Appears calm and comfortable  Respiratory system: Clear to auscultation. Respiratory effort normal. No wheezing or crackle Cardiovascular system: S1 & S2 heard, irregular heart rate.  No pedal edema. Gastrointestinal system: Abdomen is nondistended, soft and nontender. Normal bowel sounds heard. Central nervous system: Alert awake and following commands. Skin: No rashes, lesions or ulcers Psychiatry: Judgement and insight appear normal. Mood & affect appropriate.     Data Reviewed: I have personally reviewed following labs and imaging studies  CBC:  Recent Labs Lab 10/02/17 1832 10/03/17 0259  WBC 26.3* 20.0*  HGB 14.4 13.5  HCT 42.1 38.9*  MCV 94.4 93.3  PLT 231 025   Basic Metabolic Panel:  Recent Labs Lab 10/02/17 1832 10/02/17 2131 10/03/17 0259  NA 128*  --  134*  K 3.1*  --  3.4*  CL 93*  --  100*  CO2 22  --  22  GLUCOSE 196*  --  175*  BUN 30*  --  28*  CREATININE 2.12*  --  1.78*  CALCIUM 8.6*  --  8.7*  MG  --  2.0 2.0  PHOS  --  2.8 2.8   GFR: Estimated Creatinine Clearance: 28.5 mL/min (A) (by C-G formula based on SCr of 1.78 mg/dL (H)). Liver Function Tests:  Recent Labs Lab 10/02/17 1832 10/03/17 0259  AST 48* 32  ALT  32 32  ALKPHOS 110 89  BILITOT 1.4* 1.7*  PROT 7.8 6.9  ALBUMIN 3.3* 3.1*    Recent Labs Lab 10/02/17 1832  LIPASE 45   No results for input(s): AMMONIA in the last 168 hours. Coagulation Profile:  Recent Labs Lab 10/02/17 2027  INR 1.21   Cardiac Enzymes:  Recent Labs Lab 10/02/17 1832 10/02/17 2131 10/03/17 0259  TROPONINI <0.03 0.03* <0.03   BNP (last 3 results) No results for input(s): PROBNP in the last 8760 hours. HbA1C:  Recent Labs  10/03/17 0355  HGBA1C 7.0*   CBG:  Recent Labs Lab 10/03/17 0408 10/03/17 0706 10/03/17 1134  GLUCAP 164* 159* 147*   Lipid Profile: No results for input(s): CHOL, HDL, LDLCALC, TRIG, CHOLHDL, LDLDIRECT in the last 72 hours. Thyroid Function Tests:  Recent Labs  10/03/17 0355  TSH 2.255   Anemia Panel: No results for input(s): VITAMINB12, FOLATE, FERRITIN, TIBC, IRON, RETICCTPCT in the last 72 hours. Sepsis Labs:  Recent Labs Lab 10/02/17 2131 10/03/17 0000 10/03/17 0259  PROCALCITON 0.57  --   --   LATICACIDVEN  --  1.3 1.3    No results found for this or any previous visit (from the past 240 hour(s)).       Radiology Studies: Ct Head Wo Contrast  Result Date: 10/02/2017 CLINICAL DATA:  83 y/o  M; multiple falls.  History of stroke. EXAM: CT HEAD WITHOUT CONTRAST TECHNIQUE: Contiguous axial images were obtained from the base of the skull through the vertex without intravenous contrast. COMPARISON:  01/02/2017 MRI of the head. FINDINGS: Brain: No evidence of acute infarction, hemorrhage, hydrocephalus, extra-axial collection or mass lesion/mass effect. Areas of encephalomalacia in the right ACA distribution compatible with chronic infarction. Stable advanced chronic microvascular ischemic changes and parenchymal volume loss of the brain given differences in technique. Vascular: Calcific atherosclerosis carotid siphons. No hyperdense vessel identified. Skull: Normal. Negative for fracture or focal  lesion. Sinuses/Orbits: Mild maxillary and ethmoid sinus mucosal thickening. Normal aeration of mastoid air cells. Bilateral intra-ocular lens replacement. Other: None. IMPRESSION: 1. No acute intracranial abnormality identified. 2. Areas of chronic infarction in right ACA distribution. 3. Advanced chronic microvascular ischemic changes and parenchymal volume loss of the brain. Electronically Signed   By: Kristine Garbe M.D.   On: 10/02/2017 20:51   Dg Chest Port 1 View  Result Date: 10/02/2017 CLINICAL DATA:  Status post falls. EXAM: PORTABLE CHEST 1 VIEW COMPARISON:  01/01/2017 FINDINGS: Cardiac silhouette is mildly enlarged. Calcific atherosclerotic disease of the aorta. Mediastinal contours appear intact. There is no evidence of focal airspace consolidation, pleural effusion or pneumothorax. Low lung volumes. Osseous structures are without acute abnormality. Soft tissues are grossly normal. IMPRESSION: Low lung volumes. No evidence of lobar consolidation or pulmonary edema. Calcific atherosclerotic disease of the aorta. Electronically Signed   By: Fidela Salisbury M.D.   On: 10/02/2017 20:20        Scheduled  Meds: . donepezil  5 mg Oral QHS  . enoxaparin (LOVENOX) injection  30 mg Subcutaneous Daily  . finasteride  5 mg Oral QHS  . insulin aspart  0-9 Units Subcutaneous Q4H  . metoprolol succinate  50 mg Oral QHS   Continuous Infusions: . sodium chloride    . cefTRIAXone (ROCEPHIN)  IV Stopped (10/03/17 1128)     LOS: 0 days    Glenice Ciccone Tanna Furry, MD [facility] Pager [phone]  If 7PM-7AM, please contact night-coverage [email or web address] Password [email or web address] 10/03/2017, 2:47 PM

## 2017-10-03 NOTE — ED Notes (Signed)
[phone] Pandora Leiter [phone] - Wife

## 2017-10-03 NOTE — Plan of Care (Signed)
Problem: Safety: Goal: Ability to remain free from injury will improve Outcome: Progressing Low bed in use. Bed alarm on. Call bell within reach and encouraged to call. Assist x2. No fall this shift.

## 2017-10-04 DIAGNOSIS — N179 Acute kidney failure, unspecified: Secondary | ICD-10-CM | POA: Diagnosis not present

## 2017-10-04 DIAGNOSIS — I4891 Unspecified atrial fibrillation: Secondary | ICD-10-CM | POA: Diagnosis not present

## 2017-10-04 DIAGNOSIS — R197 Diarrhea, unspecified: Secondary | ICD-10-CM | POA: Diagnosis not present

## 2017-10-04 DIAGNOSIS — J449 Chronic obstructive pulmonary disease, unspecified: Secondary | ICD-10-CM | POA: Diagnosis not present

## 2017-10-04 DIAGNOSIS — N183 Chronic kidney disease, stage 3 (moderate): Secondary | ICD-10-CM | POA: Diagnosis not present

## 2017-10-04 LAB — GLUCOSE, CAPILLARY
GLUCOSE-CAPILLARY: 121 mg/dL — AB (ref 65–99)
GLUCOSE-CAPILLARY: 113 mg/dL — AB (ref 65–99)
GLUCOSE-CAPILLARY: 173 mg/dL — AB (ref 65–99)
Glucose-Capillary: 148 mg/dL — ABNORMAL HIGH (ref 65–99)
Glucose-Capillary: 191 mg/dL — ABNORMAL HIGH (ref 65–99)

## 2017-10-04 LAB — BLOOD CULTURE ID PANEL (REFLEXED)
Acinetobacter baumannii: NOT DETECTED
CANDIDA ALBICANS: NOT DETECTED
CANDIDA GLABRATA: NOT DETECTED
CANDIDA KRUSEI: NOT DETECTED
CANDIDA PARAPSILOSIS: NOT DETECTED
CANDIDA TROPICALIS: NOT DETECTED
ENTEROBACTER CLOACAE COMPLEX: NOT DETECTED
ENTEROBACTERIACEAE SPECIES: NOT DETECTED
ESCHERICHIA COLI: NOT DETECTED
Enterococcus species: NOT DETECTED
Haemophilus influenzae: NOT DETECTED
KLEBSIELLA OXYTOCA: NOT DETECTED
KLEBSIELLA PNEUMONIAE: NOT DETECTED
Listeria monocytogenes: NOT DETECTED
Methicillin resistance: DETECTED — AB
NEISSERIA MENINGITIDIS: NOT DETECTED
PROTEUS SPECIES: NOT DETECTED
Pseudomonas aeruginosa: NOT DETECTED
STAPHYLOCOCCUS SPECIES: DETECTED — AB
STREPTOCOCCUS PYOGENES: NOT DETECTED
Serratia marcescens: NOT DETECTED
Staphylococcus aureus (BCID): NOT DETECTED
Streptococcus agalactiae: NOT DETECTED
Streptococcus pneumoniae: NOT DETECTED
Streptococcus species: NOT DETECTED

## 2017-10-04 LAB — BASIC METABOLIC PANEL
Anion gap: 11 (ref 5–15)
BUN: 23 mg/dL — ABNORMAL HIGH (ref 6–20)
CALCIUM: 8.2 mg/dL — AB (ref 8.9–10.3)
CHLORIDE: 102 mmol/L (ref 101–111)
CO2: 22 mmol/L (ref 22–32)
CREATININE: 1.54 mg/dL — AB (ref 0.61–1.24)
GFR calc Af Amer: 44 mL/min — ABNORMAL LOW (ref >60)
GFR calc non Af Amer: 38 mL/min — ABNORMAL LOW (ref >60)
GLUCOSE: 128 mg/dL — AB (ref 65–99)
Potassium: 3.4 mmol/L — ABNORMAL LOW (ref 3.5–5.1)
Sodium: 135 mmol/L (ref 135–145)

## 2017-10-04 LAB — CBC
HEMATOCRIT: 36 % — AB (ref 39.0–52.0)
HEMOGLOBIN: 12.1 g/dL — AB (ref 13.0–17.0)
MCH: 31.4 pg (ref 26.0–34.0)
MCHC: 33.6 g/dL (ref 30.0–36.0)
MCV: 93.5 fL (ref 78.0–100.0)
Platelets: 183 10*3/uL (ref 150–400)
RBC: 3.85 MIL/uL — ABNORMAL LOW (ref 4.22–5.81)
RDW: 13.5 % (ref 11.5–15.5)
WBC: 16.4 10*3/uL — ABNORMAL HIGH (ref 4.0–10.5)

## 2017-10-04 LAB — URINE CULTURE: Culture: NO GROWTH

## 2017-10-04 MED ORDER — POTASSIUM CHLORIDE CRYS ER 20 MEQ PO TBCR: 20.0000 meq | EXTENDED_RELEASE_TABLET | Freq: Every day | ORAL | 0 refills | Status: DC

## 2017-10-04 MED ORDER — POTASSIUM CHLORIDE CRYS ER 20 MEQ PO TBCR
40.0000 meq | EXTENDED_RELEASE_TABLET | Freq: Every day | ORAL | Status: DC
  Administered 2017-10-04 – 2017-10-06 (×3): 40 meq via ORAL
  Filled 2017-10-04 (×3): qty 2

## 2017-10-04 NOTE — Evaluation (Signed)
Occupational Therapy Evaluation Patient Details Name: [name] [name] MRN: [id number] DOB: 12-29-1925 Today's Date: 10/04/2017    History of Present Illness 83 yo male with onset of diarrhea leading to AKI and low K+, with low lung volumes and demand ischemia per MD.  PMHx:  falls, a-fib, DM, Dementia, HTN, CHF, stroke-R ACA, COPD, apnea,    Clinical Impression   Pt admitted with above. He demonstrates the below listed deficits and will benefit from continued OT to maximize safety and independence with BADLs.  Pt presents to OT with generalized weakness, decreased activity tolerance, impaired balance, and impaired cognition,  He currently requires mod A for functional transfers, and mod - max A for LB ADLs.  He lived with wife PTA, and per chart, was mod I with BADLs.  Feel he would benefit from SNF level rehab to allow him to maximize safety and independence with ADLs, reduce risk of falls/injury, and readmission, and reduce burden of care.  Will follow acutely.       Follow Up Recommendations  SNF    Equipment Recommendations  None recommended by OT    Recommendations for Other Services       Precautions / Restrictions Precautions Precautions: Fall Precaution Comments: telemetry Restrictions Weight Bearing Restrictions: No      Mobility Bed Mobility Overal bed mobility: Needs Assistance Bed Mobility: Supine to Sit;Sit to Supine     Supine to sit: Mod assist Sit to supine: Mod assist   General bed mobility comments: step by step cues for sequencing and assist to lift trunk from bed as well as assist to lift LEs onto bed and scoot to [facility]   Transfers Overall transfer level: Needs assistance Equipment used: Rolling walker (2 wheeled);1 person hand held assist Transfers: Sit to/from [facility] Sit to Stand: Min assist;Mod assist         General transfer comment: cues for hand placement.  He will at times move into standing with min A to steady.   Other times, he moves into standing, then leans posteriorly requiring mod A to maintain balance     Balance Overall balance assessment: History of Falls;Needs assistance Sitting-balance support: Feet supported Sitting balance-Leahy Scale: Fair Sitting balance - Comments: fair after getting pt set in sitting Postural control: Posterior lean Standing balance support: Bilateral upper extremity supported;During functional activity Standing balance-Leahy Scale: Poor Standing balance comment: requires bil. UE support and min - mod A                            ADL either performed or assessed with clinical judgement   ADL Overall ADL's : Needs assistance/impaired Eating/Feeding: Set up;Sitting;Bed level   Grooming: Wash/dry hands;Wash/dry face;Oral care;Brushing hair;Supervision/safety;Set up;Sitting   Upper Body Bathing: Supervision/ safety;Sitting   Lower Body Bathing: Maximal assistance;Sit to/from stand   Upper Body Dressing : Minimal assistance;Sitting   Lower Body Dressing: Maximal assistance;Sit to/from stand Lower Body Dressing Details (indicate cue type and reason): Pt unable to pull socks over feet.  Can bend down to ankles  Toilet Transfer: Moderate assistance;Stand-pivot;BSC;RW Toilet Transfer Details (indicate cue type and reason): Pt with heavy posterior lean  Toileting- Clothing Manipulation and Hygiene: Total assistance;Sit to/from stand       Functional mobility during ADLs: Moderate assistance;Rolling walker       Vision         Perception     Praxis      Pertinent Vitals/Pain Pain Assessment:  Faces Faces Pain Scale: Hurts little more Pain Location: Rt ribs/side random pain  Pain Descriptors / Indicators: Grimacing;Moaning Pain Intervention(s): Monitored during session     Hand Dominance Right   Extremity/Trunk Assessment Upper Extremity Assessment Upper Extremity Assessment: Generalized weakness   Lower Extremity Assessment Lower  Extremity Assessment: Defer to PT evaluation   Cervical / Trunk Assessment Cervical / Trunk Assessment: Kyphotic   Communication Communication Communication: HOH   Cognition Arousal/Alertness: Awake/alert Behavior During Therapy: Flat affect Overall Cognitive Status: No family/caregiver present to determine baseline cognitive functioning                                 General Comments: Pt with h/o dementia.  No family present to confirm if pt is at baseline.   He is very quick witted    General Comments       Exercises Exercises: Other exercises (strength is grossly WFL BLE's)   Shoulder Instructions      Home Living Family/patient expects to be discharged to:: Skilled nursing facility Living Arrangements: Spouse/significant other Available Help at Discharge: Family;Available PRN/intermittently Type of Home: House Home Access: Stairs to enter [employer] of Steps: 1 Entrance Stairs-Rails: Right;Left;Can reach both Home Layout: One level     Bathroom Shower/Tub: Occupational psychologist: Handicapped height     Home Equipment: Environmental consultant - 2 wheels;Cane - single point;Shower seat - built in   Additional Comments: Pt with w/c in the room.  Pt states he thinks he uses it at home, but is unsure       Prior Functioning/Environment Level of Independence: Independent with assistive device(s);Needs assistance  Gait / Transfers Assistance Needed: RW with no assist from wife ADL's / Homemaking Assistance Needed: Assist with bathing and putting on socks.  Wife prepares meals and cleans            OT Problem List: Decreased strength;Impaired balance (sitting and/or standing);Decreased activity tolerance;Decreased cognition;Decreased safety awareness;Decreased knowledge of use of DME or AE      OT Treatment/Interventions: Self-care/ADL training;DME and/or AE instruction;Therapeutic activities;Cognitive remediation/compensation;Patient/family  education;Balance training    OT Goals(Current goals can be found in the care plan section) Acute Rehab OT Goals Patient Stated Goal: to go back to bed OT Goal Formulation: With patient Time For Goal Achievement: 10/18/17 Potential to Achieve Goals: Good ADL Goals Pt Will Perform Grooming: with min assist;standing Pt Will Perform Lower Body Bathing: with min assist;sit to/from stand Pt Will Perform Lower Body Dressing: with min assist;sit to/from stand Pt Will Transfer to Toilet: with min assist;ambulating;regular height toilet;bedside commode;grab bars Pt Will Perform Toileting - Clothing Manipulation and hygiene: with min assist;sit to/from stand  OT Frequency: Min 2X/week   Barriers to D/C: Decreased caregiver support          Co-evaluation              AM-PAC PT "6 Clicks" Daily Activity     Outcome Measure Help from another person eating meals?: A Little Help from another person taking care of personal grooming?: A Little Help from another person toileting, which includes using toliet, bedpan, or urinal?: A Lot Help from another person bathing (including washing, rinsing, drying)?: A Lot Help from another person to put on and taking off regular upper body clothing?: A Little Help from another person to put on and taking off regular lower body clothing?: A Lot 6 Click Score: 15  End of Session Equipment Utilized During Treatment: Surveyor, mining Communication: Mobility status  Activity Tolerance: Patient tolerated treatment well Patient left: in bed;with call bell/phone within reach;with bed alarm set  OT Visit Diagnosis: Unsteadiness on feet (R26.81)                Time: 3536-1443 OT Time Calculation (min): 27 min Charges:  OT General Charges $OT Visit: 1 Visit OT Evaluation $OT Eval Moderate Complexity: 1 Mod OT Treatments $Therapeutic Activity: 8-22 mins G-Codes:     [facility], OTR/L [phone]   Lucille Passy M 10/04/2017, 5:47 PM

## 2017-10-04 NOTE — Plan of Care (Signed)
Problem: Bowel/Gastric: Goal: Will not experience complications related to bowel motility Outcome: Progressing Patient's last bowel movement on 10/13. Patient aware that we need stool sample. Denies pain

## 2017-10-04 NOTE — Care Management Obs Status (Signed)
[place] NOTIFICATION   Patient Details  Name: KEIFFER PIPER MRN: [id number] Date of Birth: 1926/10/29   Medicare Observation Status Notification Given:  Yes    Harleigh Civello, Rory Percy, RN 10/04/2017, 5:04 PM

## 2017-10-04 NOTE — Progress Notes (Signed)
PHARMACY - PHYSICIAN COMMUNICATION CRITICAL VALUE ALERT - BLOOD CULTURE IDENTIFICATION (BCID)  Results for orders placed or performed during the hospital encounter of 10/02/17  Blood Culture ID Panel (Reflexed) (Collected: 10/03/2017 12:01 AM)  Result Value Ref Range   Enterococcus species NOT DETECTED NOT DETECTED   Listeria monocytogenes NOT DETECTED NOT DETECTED   Staphylococcus species DETECTED (A) NOT DETECTED   Staphylococcus aureus NOT DETECTED NOT DETECTED   Methicillin resistance DETECTED (A) NOT DETECTED   Streptococcus species NOT DETECTED NOT DETECTED   Streptococcus agalactiae NOT DETECTED NOT DETECTED   Streptococcus pneumoniae NOT DETECTED NOT DETECTED   Streptococcus pyogenes NOT DETECTED NOT DETECTED   Acinetobacter baumannii NOT DETECTED NOT DETECTED   Enterobacteriaceae species NOT DETECTED NOT DETECTED   Enterobacter cloacae complex NOT DETECTED NOT DETECTED   Escherichia coli NOT DETECTED NOT DETECTED   Klebsiella oxytoca NOT DETECTED NOT DETECTED   Klebsiella pneumoniae NOT DETECTED NOT DETECTED   Proteus species NOT DETECTED NOT DETECTED   Serratia marcescens NOT DETECTED NOT DETECTED   Haemophilus influenzae NOT DETECTED NOT DETECTED   Neisseria meningitidis NOT DETECTED NOT DETECTED   Pseudomonas aeruginosa NOT DETECTED NOT DETECTED   Candida albicans NOT DETECTED NOT DETECTED   Candida glabrata NOT DETECTED NOT DETECTED   Candida krusei NOT DETECTED NOT DETECTED   Candida parapsilosis NOT DETECTED NOT DETECTED   Candida tropicalis NOT DETECTED NOT DETECTED    Name of physician (or Provider) Contacted: Tylene Fantasia, [facility] coverage  Changes to prescribed antibiotics required: No change as 1/2 MecA staph species, likely contaminate. Will continue to monitor.   Brain Hilts 10/04/2017  7:39 PM

## 2017-10-04 NOTE — Clinical Social Work Note (Signed)
Clinical Social Work Assessment  Patient Details  Name: [name] [name] MRN: [id number] Date of Birth: 30-Aug-1926  Date of referral:  10/04/17               Reason for consult:  Facility Placement                Permission sought to share information with:  Family Supports Permission granted to share information::  No (Patient alert to person only)  Name::     [name] [name]  Agency::     Relationship::  Wife  Contact Information:  [phone] (mobile) and [phone] (home)  Housing/Transportation Living arrangements for the past 2 months:  Single Family Home Source of Information:  Spouse Patient Interpreter Needed:  None Criminal Activity/Legal Involvement Pertinent to Current Situation/Hospitalization:  No - Comment as needed Significant Relationships:  Adult Children, Other Family Members, Spouse Lives with:  Spouse Do you feel safe going back to the place where you live?  No (Wife in agreement with rehab for patient) Need for family participation in patient care:     Care giving concerns:  Wife expressed need for patient to discharge to rehab facility as she cannot care for him at home.   Social Worker assessment / plan:  CSW talked with Mrs. Blagg by phone regarding discharge plan and her request for SNF placement. Wife indicated that her husband has been to [facility] and [facility] and her first choice would be [facility]. Mrs. Melroy reported that she has 2 children and her son is temporarily staying with her as he is separated from his wife and her daughter has her own home. Wife reported that her son and daughter assist her at home and also with delight stated that she has 2 wonderful grandchildren.     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  Patient/Family's Understanding of and Emotional Response to Diagnosis, Current Treatment, and Prognosis:  Mrs. Enrico understands her husband's current medical condition and need for additional care and support post discharge and is in full agreement with [facility] rehab.   Emotional Assessment Appearance:  Appears stated age Attitude/Demeanor/Rapport:  Unable to Assess (Patient was asleep) Affect (typically observed):  Unable to Assess Orientation:  Oriented to Self Alcohol / Substance use:  Tobacco Use, Alcohol Use, Illicit Drugs (Patient reported that he quit smoking, does drink and does not use illicit drugs) Psych involvement (Current and /or in the community):  No (Comment)  Discharge Needs  Concerns to be addressed:  Discharge Planning Concerns Readmission within the last 30 days:  No Current discharge risk:  None Barriers to Discharge:  Insurance Authorization, Other (PT/OT evaluations pending)   Sable Feil, LCSW 10/04/2017, 3:53 PM

## 2017-10-04 NOTE — Discharge Summary (Addendum)
Physician Discharge Summary  [name] [name] NID:[id number] DOB: 25-Oct-1926 DOA: 10/02/2017  PCP: Haywood Pao, MD  Admit date: 10/02/2017 Discharge date: 10/05/2017  Admitted From:home Disposition:SNF  Recommendations for Outpatient Follow-up:  1. Follow up with PCP in 1-2 weeks 2. Please obtain BMP/CBC in one week 3. Please follow up final blood culture result.    Home Health:SNF Equipment/Devices:has wheelchair Discharge Condition:stable CODE STATUS:full code Diet recommendation:heart healthy  Brief/Interim Summary: 83 year old male with history of chronic atrial fibrillation, chronic diastolic congestive heart failure, COPD, diabetes, obesity, hypertension, hyperlipidemia, dementia presented with abdominal pain nausea vomiting and diarrhea. Review of systems Limited because of patient's dementia.   # Diarrhea likely viral gastroenteritis: clinically improved. No bowel movement. Abdomen exam benign. Tolerating diet well.  #Hypokalemia: repleted potassium chloride. DISCHARGED WITH ORAL POTASSIUM CHLORIDE. Recommended to monitor labs as outpatient.  #Elevated troponin likely demand ischemia: Patient with no chest pain or shortness of breath. Mild elevation in troponin likely due to reduced GFR. Evaluated by cardiology. Echocardiogram in January 2018 with EF of 60-65%.  #Dementia without behavioral disturbance: Continue supportive care.Continue Aricept. Dc to SNF when bed is available.  #COPD with chronic bronchitis: Currently stable. Continue to monitor.  #Chronic atrial fibrillation on anticoagulation: Continue metoprolol.  #Acute kidney injury on chronic kidney disease stage III: Likely in the setting of diarrhea and dehydration. Serum creatinine level improved to baseline.  #Hyperlipidemia: Continue home medication.  #pyuria, urine culture negative. Discontinue antibiotics. 1/2 bottle positive for coag negative staph bacteremia likely contaminant. Patient  is clinically improved. He is around baseline as per patient's wife. I believe this is contaminant and does not require treatment. Discussed with the pharmacist.Continue to monitor. Follow up the final culture result. I do not think patient has sepsis.  Discharge Diagnoses:  Active Problems:   Hyperlipidemia   Atrial fibrillation with RVR (HCC)   Acute renal failure superimposed on stage 3 chronic kidney disease (HCC)   Elevated troponin   COPD (chronic obstructive pulmonary disease) with chronic bronchitis (HCC)   Frequent falls    Diarrhea   Hypokalemia   Dehydration   Syncope   Discharge Instructions  Discharge Instructions    Call MD for:  difficulty breathing, headache or visual disturbances    Complete by:  As directed    Call MD for:  extreme fatigue    Complete by:  As directed    Call MD for:  hives    Complete by:  As directed    Call MD for:  persistant dizziness or light-headedness    Complete by:  As directed    Call MD for:  persistant nausea and vomiting    Complete by:  As directed    Call MD for:  severe uncontrolled pain    Complete by:  As directed    Call MD for:  temperature >100.4    Complete by:  As directed    Diet - low sodium heart healthy    Complete by:  As directed    Increase activity slowly    Complete by:  As directed      Allergies as of 10/05/2017      Reactions   Sulfa Antibiotics Swelling   Swelling of hands      Medication List    STOP taking these medications   amoxicillin-clavulanate 875-125 MG tablet Commonly known as:  AUGMENTIN   atorvastatin 20 MG tablet Commonly known as:  LIPITOR   furosemide 20 MG tablet Commonly known as:  LASIX  guaiFENesin 600 MG 12 hr tablet Commonly known as:  MUCINEX     TAKE these medications   aspirin 325 MG tablet Take 1 tablet (325 mg total) by mouth daily. What changed:  when to take this  reasons to take this   donepezil 5 MG tablet Commonly known as:  ARICEPT Take 5 mg  by mouth at bedtime.   finasteride 5 MG tablet Commonly known as:  PROSCAR Take 5 mg by mouth at bedtime.   Melatonin 10 MG Tabs Take 10 mg by mouth at bedtime.   metoprolol succinate 50 MG 24 hr tablet Commonly known as:  TOPROL-XL Take 1 tablet (50 mg total) by mouth at bedtime. Patient is overdue for an appointment and needs to schedule for further refills 2nd attempt   multivitamin with minerals Tabs tablet Take 1 tablet by mouth daily.   OVER THE COUNTER MEDICATION Apply 1 application topically See admin instructions. Apply over the counter diaper rash cream topically as needed for wound care   potassium chloride SA 20 MEQ tablet Commonly known as:  K-DUR,KLOR-CON Take 1 tablet (20 mEq total) by mouth daily.      Follow-up Information    Tisovec, Fransico Him, MD. Schedule an appointment as soon as possible for a visit in 1 week(s).   Specialty:  Internal Medicine Contact information: [place] [phone]          Allergies  Allergen Reactions  . Sulfa Antibiotics Swelling    Swelling of hands    Consultations: cardiology  Procedures/Studies: none  Subjective: Seen and examined at bedside. Reported doing well. Denied headache, dizziness, nausea vomiting chest pain shortness of breath. Denies urinary symptoms.  Discharge Exam: Vitals:   10/04/17 2122 10/05/17 0400  BP: 128/67 130/86  Pulse: 98 100  Resp: 18 18  Temp: 99.5 F (37.5 C) 99.6 F (37.6 C)  SpO2: 96% 92%   Vitals:   10/04/17 0821 10/04/17 1846 10/04/17 2122 10/05/17 0400  BP: 128/67 122/68 128/67 130/86  Pulse: 95 95 98 100  Resp: 18 18 18 18   Temp: 98.6 F (37 C) 98.4 F (36.9 C) 99.5 F (37.5 C) 99.6 F (37.6 C)  TempSrc: Oral Oral Oral Oral  SpO2: 96% 95% 96% 92%  Weight:   81 kg (178 lb 9.2 oz)   Height:        General: Pt is alert, awake, not in acute distress Cardiovascular: irregular heart rate, S1/S2 +, no rubs, no gallops Respiratory:  CTA bilaterally, no wheezing, no rhonchi Abdominal: Soft, NT, ND, bowel sounds + Extremities: no edema, no cyanosis    The results of significant diagnostics from this hospitalization (including imaging, microbiology, ancillary and laboratory) are listed below for reference.     Microbiology: Recent Results (from the past 240 hour(s))  Culture, blood (x 2)     Status: Abnormal   Collection Time: 10/03/17 12:01 AM  Result Value Ref Range Status   Specimen Description BLOOD RIGHT ARM  Final   Special Requests   Final    BOTTLES DRAWN AEROBIC AND ANAEROBIC Blood Culture adequate volume   Culture  Setup Time   Final    GRAM POSITIVE COCCI IN CLUSTERS AEROBIC BOTTLE ONLY CRITICAL RESULT CALLED TO, READ BACK BY AND VERIFIED WITH: Dion Body PHARMD 1937 10/04/17 A BROWNING    Culture (A)  Final    STAPHYLOCOCCUS SPECIES (COAGULASE NEGATIVE) THE SIGNIFICANCE OF ISOLATING THIS ORGANISM FROM A SINGLE SET OF BLOOD CULTURES WHEN MULTIPLE  SETS ARE DRAWN IS UNCERTAIN. PLEASE NOTIFY THE MICROBIOLOGY DEPARTMENT WITHIN ONE WEEK IF SPECIATION AND SENSITIVITIES ARE REQUIRED.    Report Status 10/05/2017 FINAL  Final  Blood Culture ID Panel (Reflexed)     Status: Abnormal   Collection Time: 10/03/17 12:01 AM  Result Value Ref Range Status   Enterococcus species NOT DETECTED NOT DETECTED Final   Listeria monocytogenes NOT DETECTED NOT DETECTED Final   Staphylococcus species DETECTED (A) NOT DETECTED Final    Comment: Methicillin (oxacillin) resistant coagulase negative staphylococcus. Possible blood culture contaminant (unless isolated from more than one blood culture draw or clinical case suggests pathogenicity). No antibiotic treatment is indicated for blood  culture contaminants. CRITICAL RESULT CALLED TO, READ BACK BY AND VERIFIED WITH: Dion Body PHARMD [id number] 10/04/17 A BROWNING    Staphylococcus aureus NOT DETECTED NOT DETECTED Final   Methicillin resistance DETECTED (A) NOT DETECTED Final     Comment: CRITICAL RESULT CALLED TO, READ BACK BY AND VERIFIED WITH: Dion Body PHARMD 1937 10/04/17 A BROWNING    Streptococcus species NOT DETECTED NOT DETECTED Final   Streptococcus agalactiae NOT DETECTED NOT DETECTED Final   Streptococcus pneumoniae NOT DETECTED NOT DETECTED Final   Streptococcus pyogenes NOT DETECTED NOT DETECTED Final   Acinetobacter baumannii NOT DETECTED NOT DETECTED Final   Enterobacteriaceae species NOT DETECTED NOT DETECTED Final   Enterobacter cloacae complex NOT DETECTED NOT DETECTED Final   Escherichia coli NOT DETECTED NOT DETECTED Final   Klebsiella oxytoca NOT DETECTED NOT DETECTED Final   Klebsiella pneumoniae NOT DETECTED NOT DETECTED Final   Proteus species NOT DETECTED NOT DETECTED Final   Serratia marcescens NOT DETECTED NOT DETECTED Final   Haemophilus influenzae NOT DETECTED NOT DETECTED Final   Neisseria meningitidis NOT DETECTED NOT DETECTED Final   Pseudomonas aeruginosa NOT DETECTED NOT DETECTED Final   Candida albicans NOT DETECTED NOT DETECTED Final   Candida glabrata NOT DETECTED NOT DETECTED Final   Candida krusei NOT DETECTED NOT DETECTED Final   Candida parapsilosis NOT DETECTED NOT DETECTED Final   Candida tropicalis NOT DETECTED NOT DETECTED Final  Culture, blood (x 2)     Status: None (Preliminary result)   Collection Time: 10/03/17 12:29 AM  Result Value Ref Range Status   Specimen Description BLOOD RIGHT HAND  Final   Special Requests   Final    BOTTLES DRAWN AEROBIC AND ANAEROBIC Blood Culture adequate volume   Culture NO GROWTH 1 DAY  Final   Report Status PENDING  Incomplete  Culture, Urine     Status: None   Collection Time: 10/03/17 11:00 AM  Result Value Ref Range Status   Specimen Description URINE, RANDOM  Final   Special Requests NONE  Final   Culture NO GROWTH  Final   Report Status 10/04/2017 FINAL  Final     Labs: BNP (last 3 results)  Recent Labs  10/02/17 2027  BNP 854.6*   Basic Metabolic  Panel:  Recent Labs Lab 10/02/17 1832 10/02/17 2131 10/03/17 0259 10/04/17 0312  NA 128*  --  134* 135  K 3.1*  --  3.4* 3.4*  CL 93*  --  100* 102  CO2 22  --  22 22  GLUCOSE 196*  --  175* 128*  BUN 30*  --  28* 23*  CREATININE 2.12*  --  1.78* 1.54*  CALCIUM 8.6*  --  8.7* 8.2*  MG  --  2.0 2.0  --   PHOS  --  2.8 2.8  --  Liver Function Tests:  Recent Labs Lab 10/02/17 1832 10/03/17 0259  AST 48* 32  ALT 32 32  ALKPHOS 110 89  BILITOT 1.4* 1.7*  PROT 7.8 6.9  ALBUMIN 3.3* 3.1*    Recent Labs Lab 10/02/17 1832  LIPASE 45   No results for input(s): AMMONIA in the last 168 hours. CBC:  Recent Labs Lab 10/02/17 1832 10/03/17 0259 10/04/17 0312  WBC 26.3* 20.0* 16.4*  HGB 14.4 13.5 12.1*  HCT 42.1 38.9* 36.0*  MCV 94.4 93.3 93.5  PLT 231 178 183   Cardiac Enzymes:  Recent Labs Lab 10/02/17 1832 10/02/17 2131 10/03/17 0259  TROPONINI <0.03 0.03* <0.03   BNP: Invalid input(s): POCBNP CBG:  Recent Labs Lab 10/04/17 1746 10/04/17 2132 10/05/17 0003 10/05/17 0331 10/05/17 0751  GLUCAP 113* 173* 136* 163* 138*   D-Dimer No results for input(s): DDIMER in the last 72 hours. Hgb A1c  Recent Labs  10/03/17 0355  HGBA1C 7.0*   Lipid Profile No results for input(s): CHOL, HDL, LDLCALC, TRIG, CHOLHDL, LDLDIRECT in the last 72 hours. Thyroid function studies  Recent Labs  10/03/17 0355  TSH 2.255   Anemia work up No results for input(s): VITAMINB12, FOLATE, FERRITIN, TIBC, IRON, RETICCTPCT in the last 72 hours. Urinalysis    Component Value Date/Time   COLORURINE YELLOW 10/03/2017 [place] 10/03/2017 0644   LABSPEC 1.017 10/03/2017 0644   PHURINE 6.0 10/03/2017 0644   GLUCOSEU NEGATIVE 10/03/2017 0644   HGBUR NEGATIVE 10/03/2017 0644   BILIRUBINUR NEGATIVE 10/03/2017 0644   KETONESUR 5 (A) 10/03/2017 0644   PROTEINUR 100 (A) 10/03/2017 0644   UROBILINOGEN 1.0 01/21/2012 2347   NITRITE NEGATIVE 10/03/2017  0644   LEUKOCYTESUR NEGATIVE 10/03/2017 0644   Sepsis Labs Invalid input(s): PROCALCITONIN,  WBC,  LACTICIDVEN Microbiology Recent Results (from the past 240 hour(s))  Culture, blood (x 2)     Status: Abnormal   Collection Time: 10/03/17 12:01 AM  Result Value Ref Range Status   Specimen Description BLOOD RIGHT ARM  Final   Special Requests   Final    BOTTLES DRAWN AEROBIC AND ANAEROBIC Blood Culture adequate volume   Culture  Setup Time   Final    GRAM POSITIVE COCCI IN CLUSTERS AEROBIC BOTTLE ONLY CRITICAL RESULT CALLED TO, READ BACK BY AND VERIFIED WITH: Dion Body PHARMD 1937 10/04/17 A BROWNING    Culture (A)  Final    STAPHYLOCOCCUS SPECIES (COAGULASE NEGATIVE) THE SIGNIFICANCE OF ISOLATING THIS ORGANISM FROM A SINGLE SET OF BLOOD CULTURES WHEN MULTIPLE SETS ARE DRAWN IS UNCERTAIN. PLEASE NOTIFY THE MICROBIOLOGY DEPARTMENT WITHIN ONE WEEK IF SPECIATION AND SENSITIVITIES ARE REQUIRED.    Report Status 10/05/2017 FINAL  Final  Blood Culture ID Panel (Reflexed)     Status: Abnormal   Collection Time: 10/03/17 12:01 AM  Result Value Ref Range Status   Enterococcus species NOT DETECTED NOT DETECTED Final   Listeria monocytogenes NOT DETECTED NOT DETECTED Final   Staphylococcus species DETECTED (A) NOT DETECTED Final    Comment: Methicillin (oxacillin) resistant coagulase negative staphylococcus. Possible blood culture contaminant (unless isolated from more than one blood culture draw or clinical case suggests pathogenicity). No antibiotic treatment is indicated for blood  culture contaminants. CRITICAL RESULT CALLED TO, READ BACK BY AND VERIFIED WITH: Dion Body PHARMD [id number] 10/04/17 A BROWNING    Staphylococcus aureus NOT DETECTED NOT DETECTED Final   Methicillin resistance DETECTED (A) NOT DETECTED Final    Comment: CRITICAL RESULT CALLED TO, READ BACK BY  AND VERIFIED WITHDion Body PHARMD [id number] 10/04/17 A BROWNING    Streptococcus species NOT DETECTED NOT DETECTED Final    Streptococcus agalactiae NOT DETECTED NOT DETECTED Final   Streptococcus pneumoniae NOT DETECTED NOT DETECTED Final   Streptococcus pyogenes NOT DETECTED NOT DETECTED Final   Acinetobacter baumannii NOT DETECTED NOT DETECTED Final   Enterobacteriaceae species NOT DETECTED NOT DETECTED Final   Enterobacter cloacae complex NOT DETECTED NOT DETECTED Final   Escherichia coli NOT DETECTED NOT DETECTED Final   Klebsiella oxytoca NOT DETECTED NOT DETECTED Final   Klebsiella pneumoniae NOT DETECTED NOT DETECTED Final   Proteus species NOT DETECTED NOT DETECTED Final   Serratia marcescens NOT DETECTED NOT DETECTED Final   Haemophilus influenzae NOT DETECTED NOT DETECTED Final   Neisseria meningitidis NOT DETECTED NOT DETECTED Final   Pseudomonas aeruginosa NOT DETECTED NOT DETECTED Final   Candida albicans NOT DETECTED NOT DETECTED Final   Candida glabrata NOT DETECTED NOT DETECTED Final   Candida krusei NOT DETECTED NOT DETECTED Final   Candida parapsilosis NOT DETECTED NOT DETECTED Final   Candida tropicalis NOT DETECTED NOT DETECTED Final  Culture, blood (x 2)     Status: None (Preliminary result)   Collection Time: 10/03/17 12:29 AM  Result Value Ref Range Status   Specimen Description BLOOD RIGHT HAND  Final   Special Requests   Final    BOTTLES DRAWN AEROBIC AND ANAEROBIC Blood Culture adequate volume   Culture NO GROWTH 1 DAY  Final   Report Status PENDING  Incomplete  Culture, Urine     Status: None   Collection Time: 10/03/17 11:00 AM  Result Value Ref Range Status   Specimen Description URINE, RANDOM  Final   Special Requests NONE  Final   Culture NO GROWTH  Final   Report Status 10/04/2017 FINAL  Final     Time coordinating discharge: 30 minutes  SIGNED:   Rosita Fire, MD  [facility] 10/05/2017, 11:47 AM  If 7PM-7AM, please contact night-coverage [email or web address] Password [email or web address]

## 2017-10-04 NOTE — Plan of Care (Signed)
Problem: Activity: Goal: Risk for activity intolerance will decrease Outcome: Not Progressing Pt very weak and not able to stand for longer than one minute with nightshift nurse. Will continue to work with PT/OT

## 2017-10-04 NOTE — Care Management CC44 (Signed)
Condition Code 44 Documentation Completed  Patient Details  Name: MONTRAY KLIEBERT MRN: [id number] Date of Birth: [birth year]   Condition Code 44 given:   yes Patient signature on Condition Code 44 notice:   yes Documentation of 2 MD's agreement:   yes Code 44 added to claim:   yes    Adron Bene, RN 10/04/2017, 5:01 PM

## 2017-10-04 NOTE — Progress Notes (Signed)
OT Cancellation Note  Patient Details Name: JERMAIN CURT MRN: [id number] DOB: 10-Nov-1926   Cancelled Treatment:    Reason Eval/Treat Not Completed: Fatigue/lethargy limiting ability to participate.  Pt just back to bed and cleaned up per NT.  Will reattempt.  Gordon, OTR/L [phone]   Lucille Passy M 10/04/2017, 11:34 AM

## 2017-10-04 NOTE — Care Management Obs Status (Signed)
[place] NOTIFICATION   Patient Details  Name: DARONTE SHOSTAK MRN: [id number] Date of Birth: [birth year]   Medicare Observation Status Notification Given:  Yes    Taneshia Lorence, Rory Percy, RN 10/04/2017, 5:00 PM

## 2017-10-04 NOTE — Care Management Obs Status (Signed)
[place] NOTIFICATION   Patient Details  Name: [name] [name] MRN: [id number] Date of Birth: March 10, 1926   Medicare Observation Status Notification Given:  Yes    Azlee Monforte, Rory Percy, RN 10/04/2017, 5:00 PM

## 2017-10-04 NOTE — Evaluation (Signed)
Physical Therapy Evaluation Patient Details Name: [name] [name] MRN: [id number] DOB: 1926-03-05 Today's Date: 10/04/2017   History of Present Illness  (P) 83 yo male with onset of diarrhea leading to AKI and low K+, with low lung volumes and demand ischemia per MD.  PMHx:  falls, a-fib, DM, Dementia, HTN, CHF, stroke-R ACA, COPD, apnea,   Clinical Impression  Pt was seen for evaluation of mobility but is previously using [facility] for mobility and now requires much help with RW.  Pt may be a poor historian and not giving an accurate picture of his PLOF.  Will continue on with rehab acutely to increase his safety with standing and encourage him to problem solve his transitions to increase safety awareneness.      Follow Up Recommendations SNF    Equipment Recommendations  Rolling walker with 5" wheels    Recommendations for Other Services       Precautions / Restrictions Precautions Precautions: (P) Fall Precaution Comments: telemetry Restrictions Weight Bearing Restrictions: No      Mobility  Bed Mobility Overal bed mobility: Needs Assistance Bed Mobility: Supine to Sit     Supine to sit: Mod assist     General bed mobility comments: cues for sequence and to get pt to sit up and control with independence  Transfers Overall transfer level: Needs assistance Equipment used: Rolling walker (2 wheeled);1 person hand held assist Transfers: Sit to/from Stand Sit to Stand: Min assist;Mod assist         General transfer comment: cues for hand placement from high surface  Ambulation/Gait Ambulation/Gait assistance: Min assist Ambulation Distance (Feet): 5 Feet Assistive device: Rolling walker (2 wheeled);1 person hand held assist Gait Pattern/deviations: Step-to pattern;Shuffle;Decreased stride length;Wide base of support;Drifts right/left;Trunk flexed Gait velocity: reduced Gait velocity interpretation: Below normal speed for age/gender General Gait Details: tends to try  to sit too soon but followed cues to continue to chair  Stairs            Wheelchair Mobility    Modified Rankin (Stroke Patients Only)       Balance Overall balance assessment: History of Falls;Needs assistance Sitting-balance support: Feet supported Sitting balance-Leahy Scale: Fair Sitting balance - Comments: fair after getting pt set in sitting Postural control: Posterior lean Standing balance support: Bilateral upper extremity supported;During functional activity Standing balance-Leahy Scale: Poor                               Pertinent Vitals/Pain Pain Assessment: (P) No/denies pain    Home Living Family/patient expects to be discharged to:: (P) Skilled nursing facility Living Arrangements: (P) Spouse/significant other Available Help at Discharge: (P) Family;Available PRN/intermittently Type of Home: (P) House Home Access: (P) Stairs to enter Entrance Stairs-Rails: (P) Right;Left;Can reach both Entrance Stairs-Number of Steps: (P) 1 Home Layout: (P) One level Home Equipment: (P) Walker - 2 wheels;Cane - single point;Shower seat - built in Additional Comments: (P) Pt with w/c in the room.  Pt states he thinks he uses it at home, but is unsure     Prior Function Level of Independence: (P) Independent with assistive device(s);Needs assistance   Gait / Transfers Assistance Needed: (P) RW with no assist from wife  ADL's / Homemaking Assistance Needed: (P) Assist with bathing and putting on socks.  Wife prepares meals and cleans        Hand Dominance   Dominant Hand: (P) Right    Extremity/Trunk Assessment  Upper Extremity Assessment Upper Extremity Assessment: (P) Generalized weakness    Lower Extremity Assessment Lower Extremity Assessment: (P) Defer to PT evaluation    Cervical / Trunk Assessment Cervical / Trunk Assessment: (P) Kyphotic  Communication   Communication: (P) HOH  Cognition Arousal/Alertness: (P) Awake/alert Behavior  During Therapy: (P) Flat affect Overall Cognitive Status: (P) No family/caregiver present to determine baseline cognitive functioning                                 General Comments: (P) Pt with h/o dementia.  No family present to confirm if pt is at baseline.   He is very quick witted       General Comments      Exercises     Assessment/Plan    PT Assessment Patient needs continued PT services  PT Problem List Decreased strength;Decreased range of motion;Decreased activity tolerance;Decreased balance;Decreased mobility;Decreased coordination;Decreased safety awareness;Decreased knowledge of use of DME;Cardiopulmonary status limiting activity;Obesity;Decreased skin integrity       PT Treatment Interventions DME instruction;Gait training;Functional mobility training;Therapeutic activities;Therapeutic exercise;Balance training;Neuromuscular re-education;Patient/family education;Stair training    PT Goals (Current goals can be found in the Care Plan section)  Acute Rehab PT Goals Patient Stated Goal: to get home and feel stronger PT Goal Formulation: With patient Time For Goal Achievement: 10/18/17 Potential to Achieve Goals: Good    Frequency Min 3X/week   Barriers to discharge Decreased caregiver support;Inaccessible home environment wife may not be able to assist pt due to increased dependence    Co-evaluation               AM-PAC PT "6 Clicks" Daily Activity  Outcome Measure Difficulty turning over in bed (including adjusting bedclothes, sheets and blankets)?: Unable Difficulty moving from lying on back to sitting on the side of the bed? : Unable Difficulty sitting down on and standing up from a chair with arms (e.g., wheelchair, bedside commode, etc,.)?: Unable Help needed moving to and from a bed to chair (including a wheelchair)?: A Lot Help needed walking in hospital room?: A Lot Help needed climbing 3-5 steps with a railing? : Total 6 Click  Score: 8    End of Session Equipment Utilized During Treatment: Gait belt;Oxygen Activity Tolerance: Patient tolerated treatment well;Patient limited by fatigue Patient left: in chair;with call bell/phone within reach;with chair alarm set;with nursing/sitter in room Nurse Communication: Mobility status PT Visit Diagnosis: Unsteadiness on feet (R26.81);Repeated falls (R29.6);Muscle weakness (generalized) (M62.81);Difficulty in walking, not elsewhere classified (R26.2)    Time: 5631-4970 PT Time Calculation (min) (ACUTE ONLY): 36 min   Charges:   PT Evaluation $PT Eval Moderate Complexity: 1 Mod PT Treatments $Therapeutic Activity: 8-22 mins   PT G Codes:   PT G-Codes **NOT FOR INPATIENT CLASS** Functional Assessment Tool Used: AM-PAC 6 Clicks Basic Mobility;Clinical judgement Functional Limitation: Mobility: Walking and moving around Mobility: Walking and Moving Around Current Status (Y6378): At least 60 percent but less than 80 percent impaired, limited or restricted Mobility: Walking and Moving Around Goal Status [phone]): At least 20 percent but less than 40 percent impaired, limited or restricted    Ramond Dial 10/04/2017, 5:37 PM   Mee Hives, PT MS Acute Rehab Dept. Number: [place]

## 2017-10-05 ENCOUNTER — Ambulatory Visit: Payer: Medicare Other | Admitting: Sports Medicine

## 2017-10-05 DIAGNOSIS — J449 Chronic obstructive pulmonary disease, unspecified: Secondary | ICD-10-CM | POA: Diagnosis not present

## 2017-10-05 DIAGNOSIS — I4891 Unspecified atrial fibrillation: Secondary | ICD-10-CM | POA: Diagnosis not present

## 2017-10-05 DIAGNOSIS — N183 Chronic kidney disease, stage 3 (moderate): Secondary | ICD-10-CM | POA: Diagnosis not present

## 2017-10-05 DIAGNOSIS — N179 Acute kidney failure, unspecified: Secondary | ICD-10-CM | POA: Diagnosis not present

## 2017-10-05 LAB — GLUCOSE, CAPILLARY
GLUCOSE-CAPILLARY: 138 mg/dL — AB (ref 65–99)
GLUCOSE-CAPILLARY: 132 mg/dL — AB (ref 65–99)
Glucose-Capillary: 118 mg/dL — ABNORMAL HIGH (ref 65–99)
Glucose-Capillary: 136 mg/dL — ABNORMAL HIGH (ref 65–99)
Glucose-Capillary: 163 mg/dL — ABNORMAL HIGH (ref 65–99)
Glucose-Capillary: 203 mg/dL — ABNORMAL HIGH (ref 65–99)

## 2017-10-05 LAB — CULTURE, BLOOD (ROUTINE X 2): SPECIAL REQUESTS: ADEQUATE

## 2017-10-05 NOTE — Clinical Social Work Note (Signed)
Patient will discharge to [facility] on Wednesday, 10/17. Facility contacted regarding insurance auth information and d/c today and due to a mix-up bed no longer available. Per Olivia Mackie with [facility], they will have a bed for patient on Wednesday. See Placement Note for insurance auth information.   Knoah Nedeau Givens, MSW, LCSW Licensed Clinical Social Worker [place] [phone]

## 2017-10-05 NOTE — Care Management Note (Signed)
Case Management Note  Patient Details  Name: [name] [name] MRN: [id number] Date of Birth: October 24, 1926  Subjective/Objective:     CM following for progression and d/c planning.               Action/Plan: 10/05/2017 Pt originally for d/c to home with [facility] however family would like pt to d/c to SNF for short term rehab. This CM notified CSW Crawford Givens who will speak with pt wife and begin process of placement for this pt.   Expected Discharge Date:  10/05/2017             Expected Discharge Plan:  Skilled Nursing Facility  In-House Referral:  Clinical Social Work  Discharge planning Services  CM Consult  Post Acute Care Choice:  NA Choice offered to:  NA  DME Arranged:  N/A DME Agency:  NA  HH Arranged:  NA HH Agency:  NA  Status of Service:  Completed, signed off  If discussed at [employer] of Stay Meetings, dates discussed:    Additional Comments:  Adron Bene, RN 10/05/2017, 1:37 PM

## 2017-10-05 NOTE — NC FL2 (Signed)
[place] LEVEL OF CARE SCREENING TOOL     IDENTIFICATION  Patient Name: [name] [name] Birthdate: 08-21-1926 Sex: male Admission Date (Current Location): 10/02/2017  [facility] and [place] Number:  Herbalist and Address:  [facility], [place] [address] [place], [place] [address]      Provider Number: [id number]  Attending Physician Name and Address:  Rosita Fire, MD  Relative Name and Phone Number:  Sriyan Cutting - wife. [phone]    Current Level of Care: Hospital Recommended Level of Care: [place] Prior Approval Number:    Date Approved/Denied:   PASRR Number: [id number]  Discharge Plan: SNF    Current Diagnoses: Patient Active Problem List   Diagnosis Date Noted  . AKI (acute kidney injury) ([place]) 10/03/2017  . Diarrhea 10/02/2017  . Hypokalemia 10/02/2017  . Dehydration 10/02/2017  . Syncope 10/02/2017  . Cerebral embolism with cerebral infarction 01/04/2017  . Dementia without behavioral disturbance   . Falls 01/03/2017  . Generalized weakness 01/01/2017  . Frequent falls 01/01/2017  . Acute hypoxemic respiratory failure ([place]) 04/09/2016  . COPD (chronic obstructive pulmonary disease) with chronic bronchitis ([place]) 03/12/2016  . HCAP (healthcare-associated pneumonia) 02/26/2016  . Elevated troponin 02/26/2016  . Elevated lactic acid level 01/28/2016  . Sepsis ([place]) 01/28/2016  . Acute renal failure superimposed on stage 3 chronic kidney disease ([place]) 04/21/2015  . Hyperglycemia 04/21/2015  . Bronchitis 04/21/2015  . Tachycardia 04/21/2015  . BPH (benign prostatic hypertrophy) 04/21/2015  . Dyspnea 12/28/2012  . Fall 12/28/2012  . OTHER DYSPNEA AND RESPIRATORY ABNORMALITIES 05/27/2010  . Hyperlipidemia 11/27/2008  . HYPERTENSION, BENIGN 11/27/2008  . Atrial fibrillation with RVR ([place]) 11/27/2008  . GERD 11/27/2008  . BPH/LUTS W/O OBSTRUCTION 11/27/2008  . ARTHRITIS  11/27/2008    Orientation RESPIRATION BLADDER Height & Weight     Self  Normal Incontinent (External catheter placed 10/14) Weight: 178 lb 9.2 oz (81 kg) Height:  5\' 5"  (165.1 cm)  BEHAVIORAL SYMPTOMS/MOOD NEUROLOGICAL BOWEL NUTRITION STATUS      Continent Diet (Heart healthy)  AMBULATORY STATUS COMMUNICATION OF NEEDS Skin   Limited Assist Verbally Other (Comment) (Ecchymosis left leg )                       Personal Care Assistance Level of Assistance  Bathing, Feeding, Dressing Bathing Assistance: Maximum assistance (Upper body-supervision/Lower body max assist) Feeding assistance: Limited assistance (Assistance with set-up) Dressing Assistance: Maximum assistance (Upper body-min assist/Lower body max assist )     Functional Limitations Info  Sight, Hearing, Speech Sight Info: Adequate Hearing Info: Impaired Speech Info: Adequate    SPECIAL CARE FACTORS FREQUENCY  PT (By licensed PT), OT (By licensed OT)     PT Frequency: Evaluated 10/15 and a minimum of 3X per week therapy recommended during hosp. stay OT Frequency: Evaluated 10/15 and a minimun of 2X per week therapy recommended during hosp. stay            Contractures Contractures Info: Not present    Additional Factors Info  Code Status, Allergies, Insulin Sliding Scale Code Status Info: Full Allergies Info: Sulfa antibiotics   Insulin Sliding Scale Info: Insulin 0-9 units every 4 hours       Current Medications (10/05/2017):  This is the current hospital active medication list Current Facility-Administered Medications  Medication Dose Route Frequency Provider Last Rate Last Dose  . acetaminophen (TYLENOL) tablet 650 mg  650 mg Oral Q6H  PRN Toy Baker, MD   650 mg at 10/03/17 1221   Or  . acetaminophen (TYLENOL) suppository 650 mg  650 mg Rectal Q6H PRN Doutova, Anastassia, MD      . donepezil (ARICEPT) tablet 5 mg  5 mg Oral QHS Doutova, Anastassia, MD   5 mg at 10/04/17 2100  . enoxaparin  (LOVENOX) injection 30 mg  30 mg Subcutaneous Daily Doutova, Anastassia, MD   30 mg at 10/05/17 1014  . finasteride (PROSCAR) tablet 5 mg  5 mg Oral QHS Doutova, Anastassia, MD   5 mg at 10/04/17 2100  . HYDROcodone-acetaminophen (NORCO/VICODIN) 5-325 MG per tablet 1-2 tablet  1-2 tablet Oral Q4H PRN Toy Baker, MD   2 tablet at 10/03/17 1824  . insulin aspart (novoLOG) injection 0-9 Units  0-9 Units Subcutaneous Q4H Toy Baker, MD   1 Units at 10/05/17 1016  . metoprolol succinate (TOPROL-XL) 24 hr tablet 50 mg  50 mg Oral QHS Doutova, Anastassia, MD   50 mg at 10/04/17 2100  . ondansetron (ZOFRAN) tablet 4 mg  4 mg Oral Q6H PRN Doutova, Anastassia, MD       Or  . ondansetron (ZOFRAN) injection 4 mg  4 mg Intravenous Q6H PRN Doutova, Anastassia, MD      . potassium chloride SA (K-DUR,KLOR-CON) CR tablet 40 mEq  40 mEq Oral Daily Rosita Fire, MD   40 mEq at 10/05/17 1014     Discharge Medications: Please see discharge summary for a list of discharge medications.  Relevant Imaging Results:  Relevant Lab Results:   Additional Information ss#[id number]  Sable Feil, LCSW

## 2017-10-05 NOTE — Clinical Social Work Placement (Signed)
   CLINICAL SOCIAL WORK PLACEMENT  NOTE 10/06/17 (WED) - D/C TO [facility]IA AMBULANCE **SEE INSURANCE AUTH INFORMATION UNDER ADDITIONAL COMMENTS**  Date:  10/05/2017  Patient Details  Name: [name] [name] MRN: [id number] Date of Birth: 11/09/1926  Clinical Social Work is seeking post-discharge placement for this patient at the [place] level of care (*CSW will initial, date and re-position this form in  chart as items are completed):  No (Wife provided facility preference)   Patient/family provided with [facility] Work Department's list of facilities offering this level of care within the geographic area requested by the patient (or if unable, by the patient's family).  Yes   Patient/family informed of their freedom to choose among providers that offer the needed level of care, that participate in Medicare, Medicaid or managed care program needed by the patient, have an available bed and are willing to accept the patient.  No   Patient/family informed of [facility]'s ownership interest in [facility] and [facility], as well as of the fact that they are under no obligation to receive care at these facilities.  PASRR submitted to EDS on       PASRR number received on       Existing PASRR number confirmed on 10/05/17     FL2 transmitted to all facilities in geographic area requested by pt/family on 10/05/17     FL2 transmitted to all facilities within larger geographic area on       Patient informed that his/her managed care company has contracts with or will negotiate with certain facilities, including the following:        Yes   Patient/family informed of bed offers received.  Patient [facility] bed at [facility]     Physician recommends and patient chooses bed at      Patient to be transferred to [facility] on 10/06/17.  Patient to be transferred to facility by Ambulance     Patient family notified on 10/06/17 of  transfer.  Name of family member notified:  Wife, Clement Deneault     PHYSICIAN     Additional Comment:  10/05/17 - Received insurance authorization at 5:04 pm.from April with [employer] #[id number] [place]. 09/1717. RUG Level-RVB and therapy minutes - 568 per week. Auth effective for 3 days and next review date is 10/19. April's phone number is [phone], ext. [phone].   _______________________________________________ Sable Feil, LCSW 10/05/2017, 6:08 PM

## 2017-10-05 NOTE — Progress Notes (Signed)
PROGRESS NOTE    RAD GRAMLING  ACZ:[id number] DOB: [birth year] DOA: 10/02/2017 PCP: Haywood Pao, MD   Brief Narrative: 83 year old male with history of chronic atrial fibrillation, chronic diastolic congestive heart failure, COPD, diabetes, obesity, hypertension, hyperlipidemia, dementia presented with abdominal pain nausea vomiting and diarrhea. Review of systems Limited because of patient's dementia.  Patient was discharged yesterday however he didn't leave because of waiting for bed at SNF. Discussed with pt's wife and SW today.  Assessment & Plan:   # Diarrhea likely viral gastroenteritis: clinically improved. No bowel movement. Abdomen exam benign. Tolerating diet well.  #Hypokalemia: continue potassium chloride replacement. Recommended to monitor labs outpatient.  #Elevated troponin likely demand ischemia: Patient with no chest pain or shortness of breath. Mild elevation in troponin likely due to reduced GFR. Evaluated by cardiology. Echocardiogram in January 2018 with EF of 60-65%.  #Dementia without behavioral disturbance,type unknown: Continue supportive care.Continue Aricept. Dc to SNF when bed is available.  #COPD with chronic bronchitis: Currently stable. Continue to monitor.  #Chronic atrial fibrillation on anticoagulation: Continue metoprolol.  #Acute kidney injury on chronic kidney disease stage III: Likely in the setting of diarrhea and dehydration. Serum creatinine level improved to baseline.  #Hyperlipidemia: Continue home medication.  #pyuria, urine culture negative. Discontinue antibiotics. 1/2 bottle positive for coag negative staph bacteremia likely contaminant. Patient is clinically improved. He is around baseline as per patient's wife. I believe this is contaminant and does not require treatment. Discussed with the pharmacist.Continue to monitor. Follow up the final culture result.  DVT prophylaxis:Lovenox subcutaneous Code Status:full  code Family Communication:discussed with the patient's wife at bedside Disposition Plan:patient will be discharged when bed is available    Consultants:   cardiologist  Procedures:none Antimicrobials:none  Subjective: Seen and examined at bedside. Denied headache, dizziness, nausea vomiting chest pain shortness of breath. Patient's wife at bedside  Objective: Vitals:   10/04/17 0821 10/04/17 1846 10/04/17 2122 10/05/17 0400  BP: 128/67 122/68 128/67 130/86  Pulse: 95 95 98 100  Resp: 18 18 18 18   Temp: 98.6 F (37 C) 98.4 F (36.9 C) 99.5 F (37.5 C) 99.6 F (37.6 C)  TempSrc: Oral Oral Oral Oral  SpO2: 96% 95% 96% 92%  Weight:   81 kg (178 lb 9.2 oz)   Height:        Intake/Output Summary (Last 24 hours) at 10/05/17 1142 Last data filed at 10/05/17 0700  Gross per 24 hour  Intake             1086 ml  Output              750 ml  Net              336 ml   Filed Weights   10/02/17 1745 10/03/17 0316 10/04/17 2122  Weight: 95.3 kg (210 lb) 87 kg (191 lb 12.8 oz) 81 kg (178 lb 9.2 oz)    Examination:  General exam: Appears calm and comfortable  Respiratory system: Clear to auscultation. Respiratory effort normal. No wheezing or crackle Cardiovascular system: S1 & S2 heard, RRR.  No pedal edema. Gastrointestinal system: Abdomen is nondistended, soft and nontender. Normal bowel sounds heard. Central nervous system: Alert awake and following commands Skin: No rashes, lesions or ulcers Psychiatry: Judgement and insight appearimpaired    Data Reviewed: I have personally reviewed following labs and imaging studies  CBC:  Recent Labs Lab 10/02/17 1832 10/03/17 0259 10/04/17 0312  WBC 26.3* 20.0* 16.4*  HGB 14.4  13.5 12.1*  HCT 42.1 38.9* 36.0*  MCV 94.4 93.3 93.5  PLT 231 178 277   Basic Metabolic Panel:  Recent Labs Lab 10/02/17 1832 10/02/17 2131 10/03/17 0259 10/04/17 0312  NA 128*  --  134* 135  K 3.1*  --  3.4* 3.4*  CL 93*  --  100* 102   CO2 22  --  22 22  GLUCOSE 196*  --  175* 128*  BUN 30*  --  28* 23*  CREATININE 2.12*  --  1.78* 1.54*  CALCIUM 8.6*  --  8.7* 8.2*  MG  --  2.0 2.0  --   PHOS  --  2.8 2.8  --    GFR: Estimated Creatinine Clearance: 31.9 mL/min (A) (by C-G formula based on SCr of 1.54 mg/dL (H)). Liver Function Tests:  Recent Labs Lab 10/02/17 1832 10/03/17 0259  AST 48* 32  ALT 32 32  ALKPHOS 110 89  BILITOT 1.4* 1.7*  PROT 7.8 6.9  ALBUMIN 3.3* 3.1*    Recent Labs Lab 10/02/17 1832  LIPASE 45   No results for input(s): AMMONIA in the last 168 hours. Coagulation Profile:  Recent Labs Lab 10/02/17 2027  INR 1.21   Cardiac Enzymes:  Recent Labs Lab 10/02/17 1832 10/02/17 2131 10/03/17 0259  TROPONINI <0.03 0.03* <0.03   BNP (last 3 results) No results for input(s): PROBNP in the last 8760 hours. HbA1C:  Recent Labs  10/03/17 0355  HGBA1C 7.0*   CBG:  Recent Labs Lab 10/04/17 1746 10/04/17 2132 10/05/17 0003 10/05/17 0331 10/05/17 0751  GLUCAP 113* 173* 136* 163* 138*   Lipid Profile: No results for input(s): CHOL, HDL, LDLCALC, TRIG, CHOLHDL, LDLDIRECT in the last 72 hours. Thyroid Function Tests:  Recent Labs  10/03/17 0355  TSH 2.255   Anemia Panel: No results for input(s): VITAMINB12, FOLATE, FERRITIN, TIBC, IRON, RETICCTPCT in the last 72 hours. Sepsis Labs:  Recent Labs Lab 10/02/17 2131 10/03/17 0000 10/03/17 0259  PROCALCITON 0.57  --   --   LATICACIDVEN  --  1.3 1.3    Recent Results (from the past 240 hour(s))  Culture, blood (x 2)     Status: Abnormal   Collection Time: 10/03/17 12:01 AM  Result Value Ref Range Status   Specimen Description BLOOD RIGHT ARM  Final   Special Requests   Final    BOTTLES DRAWN AEROBIC AND ANAEROBIC Blood Culture adequate volume   Culture  Setup Time   Final    GRAM POSITIVE COCCI IN CLUSTERS AEROBIC BOTTLE ONLY CRITICAL RESULT CALLED TO, READ BACK BY AND VERIFIED WITH: Dion Body PHARMD 1937  10/04/17 A BROWNING    Culture (A)  Final    STAPHYLOCOCCUS SPECIES (COAGULASE NEGATIVE) THE SIGNIFICANCE OF ISOLATING THIS ORGANISM FROM A SINGLE SET OF BLOOD CULTURES WHEN MULTIPLE SETS ARE DRAWN IS UNCERTAIN. PLEASE NOTIFY THE MICROBIOLOGY DEPARTMENT WITHIN ONE WEEK IF SPECIATION AND SENSITIVITIES ARE REQUIRED.    Report Status 10/05/2017 FINAL  Final  Blood Culture ID Panel (Reflexed)     Status: Abnormal   Collection Time: 10/03/17 12:01 AM  Result Value Ref Range Status   Enterococcus species NOT DETECTED NOT DETECTED Final   Listeria monocytogenes NOT DETECTED NOT DETECTED Final   Staphylococcus species DETECTED (A) NOT DETECTED Final    Comment: Methicillin (oxacillin) resistant coagulase negative staphylococcus. Possible blood culture contaminant (unless isolated from more than one blood culture draw or clinical case suggests pathogenicity). No antibiotic treatment is indicated for blood  culture  contaminants. CRITICAL RESULT CALLED TO, READ BACK BY AND VERIFIED WITH: Dion Body PHARMD 0076 10/04/17 A BROWNING    Staphylococcus aureus NOT DETECTED NOT DETECTED Final   Methicillin resistance DETECTED (A) NOT DETECTED Final    Comment: CRITICAL RESULT CALLED TO, READ BACK BY AND VERIFIED WITH: Dion Body PHARMD 1937 10/04/17 A BROWNING    Streptococcus species NOT DETECTED NOT DETECTED Final   Streptococcus agalactiae NOT DETECTED NOT DETECTED Final   Streptococcus pneumoniae NOT DETECTED NOT DETECTED Final   Streptococcus pyogenes NOT DETECTED NOT DETECTED Final   Acinetobacter baumannii NOT DETECTED NOT DETECTED Final   Enterobacteriaceae species NOT DETECTED NOT DETECTED Final   Enterobacter cloacae complex NOT DETECTED NOT DETECTED Final   Escherichia coli NOT DETECTED NOT DETECTED Final   Klebsiella oxytoca NOT DETECTED NOT DETECTED Final   Klebsiella pneumoniae NOT DETECTED NOT DETECTED Final   Proteus species NOT DETECTED NOT DETECTED Final   Serratia marcescens NOT  DETECTED NOT DETECTED Final   Haemophilus influenzae NOT DETECTED NOT DETECTED Final   Neisseria meningitidis NOT DETECTED NOT DETECTED Final   Pseudomonas aeruginosa NOT DETECTED NOT DETECTED Final   Candida albicans NOT DETECTED NOT DETECTED Final   Candida glabrata NOT DETECTED NOT DETECTED Final   Candida krusei NOT DETECTED NOT DETECTED Final   Candida parapsilosis NOT DETECTED NOT DETECTED Final   Candida tropicalis NOT DETECTED NOT DETECTED Final  Culture, blood (x 2)     Status: None (Preliminary result)   Collection Time: 10/03/17 12:29 AM  Result Value Ref Range Status   Specimen Description BLOOD RIGHT HAND  Final   Special Requests   Final    BOTTLES DRAWN AEROBIC AND ANAEROBIC Blood Culture adequate volume   Culture NO GROWTH 1 DAY  Final   Report Status PENDING  Incomplete  Culture, Urine     Status: None   Collection Time: 10/03/17 11:00 AM  Result Value Ref Range Status   Specimen Description URINE, RANDOM  Final   Special Requests NONE  Final   Culture NO GROWTH  Final   Report Status 10/04/2017 FINAL  Final         Radiology Studies: No results found.      Scheduled Meds: . donepezil  5 mg Oral QHS  . enoxaparin (LOVENOX) injection  30 mg Subcutaneous Daily  . finasteride  5 mg Oral QHS  . insulin aspart  0-9 Units Subcutaneous Q4H  . metoprolol succinate  50 mg Oral QHS  . potassium chloride  40 mEq Oral Daily   Continuous Infusions:   LOS: 1 day    Roselin Wiemann Tanna Furry, MD [facility] Pager [phone]  If 7PM-7AM, please contact night-coverage [email or web address] Password [email or web address] 10/05/2017, 11:42 AM

## 2017-10-05 NOTE — Progress Notes (Signed)
Occupational Therapy Evaluation Addendum    Oct 23, 2017 1735  OT G-codes **NOT FOR INPATIENT CLASS**  Functional Assessment Tool Used AM-PAC 6 Clicks Daily Activity  Functional Limitation Self care  Self Care Current Status (D3143) CK  Self Care Goal Status (O8875) Riley Lam, OTR/L [phone]

## 2017-10-06 ENCOUNTER — Observation Stay (HOSPITAL_COMMUNITY): Payer: Medicare Other

## 2017-10-06 DIAGNOSIS — K529 Noninfective gastroenteritis and colitis, unspecified: Secondary | ICD-10-CM

## 2017-10-06 DIAGNOSIS — I10 Essential (primary) hypertension: Secondary | ICD-10-CM | POA: Diagnosis not present

## 2017-10-06 DIAGNOSIS — J449 Chronic obstructive pulmonary disease, unspecified: Secondary | ICD-10-CM

## 2017-10-06 DIAGNOSIS — Z7409 Other reduced mobility: Secondary | ICD-10-CM | POA: Diagnosis not present

## 2017-10-06 DIAGNOSIS — N400 Enlarged prostate without lower urinary tract symptoms: Secondary | ICD-10-CM | POA: Diagnosis not present

## 2017-10-06 DIAGNOSIS — R05 Cough: Secondary | ICD-10-CM

## 2017-10-06 DIAGNOSIS — I679 Cerebrovascular disease, unspecified: Secondary | ICD-10-CM | POA: Diagnosis not present

## 2017-10-06 DIAGNOSIS — Z79899 Other long term (current) drug therapy: Secondary | ICD-10-CM | POA: Diagnosis not present

## 2017-10-06 DIAGNOSIS — E1122 Type 2 diabetes mellitus with diabetic chronic kidney disease: Secondary | ICD-10-CM | POA: Diagnosis not present

## 2017-10-06 DIAGNOSIS — R296 Repeated falls: Secondary | ICD-10-CM | POA: Diagnosis not present

## 2017-10-06 DIAGNOSIS — Z6829 Body mass index (BMI) 29.0-29.9, adult: Secondary | ICD-10-CM | POA: Diagnosis not present

## 2017-10-06 DIAGNOSIS — R5381 Other malaise: Secondary | ICD-10-CM | POA: Diagnosis not present

## 2017-10-06 DIAGNOSIS — E785 Hyperlipidemia, unspecified: Secondary | ICD-10-CM

## 2017-10-06 DIAGNOSIS — F0390 Unspecified dementia without behavioral disturbance: Secondary | ICD-10-CM

## 2017-10-06 DIAGNOSIS — E669 Obesity, unspecified: Secondary | ICD-10-CM | POA: Diagnosis not present

## 2017-10-06 DIAGNOSIS — R748 Abnormal levels of other serum enzymes: Secondary | ICD-10-CM | POA: Diagnosis not present

## 2017-10-06 DIAGNOSIS — A419 Sepsis, unspecified organism: Secondary | ICD-10-CM | POA: Diagnosis not present

## 2017-10-06 DIAGNOSIS — N183 Chronic kidney disease, stage 3 (moderate): Secondary | ICD-10-CM

## 2017-10-06 DIAGNOSIS — K2970 Gastritis, unspecified, without bleeding: Secondary | ICD-10-CM | POA: Diagnosis not present

## 2017-10-06 DIAGNOSIS — E876 Hypokalemia: Secondary | ICD-10-CM

## 2017-10-06 DIAGNOSIS — R41841 Cognitive communication deficit: Secondary | ICD-10-CM | POA: Diagnosis not present

## 2017-10-06 DIAGNOSIS — I482 Chronic atrial fibrillation: Secondary | ICD-10-CM | POA: Diagnosis not present

## 2017-10-06 DIAGNOSIS — N179 Acute kidney failure, unspecified: Secondary | ICD-10-CM | POA: Diagnosis not present

## 2017-10-06 DIAGNOSIS — E860 Dehydration: Secondary | ICD-10-CM

## 2017-10-06 DIAGNOSIS — I5032 Chronic diastolic (congestive) heart failure: Secondary | ICD-10-CM | POA: Diagnosis not present

## 2017-10-06 DIAGNOSIS — M6281 Muscle weakness (generalized): Secondary | ICD-10-CM | POA: Diagnosis not present

## 2017-10-06 DIAGNOSIS — Z8673 Personal history of transient ischemic attack (TIA), and cerebral infarction without residual deficits: Secondary | ICD-10-CM | POA: Diagnosis not present

## 2017-10-06 DIAGNOSIS — I5030 Unspecified diastolic (congestive) heart failure: Secondary | ICD-10-CM | POA: Diagnosis not present

## 2017-10-06 DIAGNOSIS — R278 Other lack of coordination: Secondary | ICD-10-CM | POA: Diagnosis not present

## 2017-10-06 DIAGNOSIS — I130 Hypertensive heart and chronic kidney disease with heart failure and stage 1 through stage 4 chronic kidney disease, or unspecified chronic kidney disease: Secondary | ICD-10-CM | POA: Diagnosis not present

## 2017-10-06 DIAGNOSIS — I509 Heart failure, unspecified: Secondary | ICD-10-CM | POA: Diagnosis not present

## 2017-10-06 DIAGNOSIS — I4891 Unspecified atrial fibrillation: Secondary | ICD-10-CM | POA: Diagnosis not present

## 2017-10-06 DIAGNOSIS — Z9181 History of falling: Secondary | ICD-10-CM | POA: Diagnosis not present

## 2017-10-06 DIAGNOSIS — R4182 Altered mental status, unspecified: Secondary | ICD-10-CM | POA: Diagnosis not present

## 2017-10-06 HISTORY — DX: Noninfective gastroenteritis and colitis, unspecified: K52.9

## 2017-10-06 LAB — BASIC METABOLIC PANEL
ANION GAP: 10 (ref 5–15)
BUN: 19 mg/dL (ref 6–20)
CALCIUM: 8.6 mg/dL — AB (ref 8.9–10.3)
CO2: 19 mmol/L — ABNORMAL LOW (ref 22–32)
Chloride: 105 mmol/L (ref 101–111)
Creatinine, Ser: 1.44 mg/dL — ABNORMAL HIGH (ref 0.61–1.24)
GFR, EST AFRICAN AMERICAN: 48 mL/min — AB (ref >60)
GFR, EST NON AFRICAN AMERICAN: 42 mL/min — AB (ref >60)
GLUCOSE: 160 mg/dL — AB (ref 65–99)
Potassium: 3.9 mmol/L (ref 3.5–5.1)
SODIUM: 134 mmol/L — AB (ref 135–145)

## 2017-10-06 LAB — CBC
HCT: 37 % — ABNORMAL LOW (ref 39.0–52.0)
Hemoglobin: 12.5 g/dL — ABNORMAL LOW (ref 13.0–17.0)
MCH: 31.4 pg (ref 26.0–34.0)
MCHC: 33.8 g/dL (ref 30.0–36.0)
MCV: 93 fL (ref 78.0–100.0)
PLATELETS: 213 10*3/uL (ref 150–400)
RBC: 3.98 MIL/uL — ABNORMAL LOW (ref 4.22–5.81)
RDW: 13.5 % (ref 11.5–15.5)
WBC: 8.6 10*3/uL (ref 4.0–10.5)

## 2017-10-06 LAB — GLUCOSE, CAPILLARY
GLUCOSE-CAPILLARY: 101 mg/dL — AB (ref 65–99)
GLUCOSE-CAPILLARY: 112 mg/dL — AB (ref 65–99)
GLUCOSE-CAPILLARY: 126 mg/dL — AB (ref 65–99)
GLUCOSE-CAPILLARY: 139 mg/dL — AB (ref 65–99)

## 2017-10-06 NOTE — Progress Notes (Signed)
PT Cancellation Note  Patient Details Name: [name] [name] MRN: [id number] DOB: August 17, 1926   Cancelled Treatment:    Reason Eval/Treat Not Completed: Patient's level of consciousness.  Per nursing pt was awake most of last night and finally sleeping.  Check tomorrow if pt is still here.   Ramond Dial 10/06/2017, 2:25 PM   Mee Hives, PT MS Acute Rehab Dept. Number: [place] and [place]

## 2017-10-06 NOTE — Progress Notes (Signed)
Attempted times 6 to call report to give RN at [facility] without success.  Patient going to room #1206. PTAR to be called.

## 2017-10-06 NOTE — Care Management (Signed)
Pt discharging to [facility] today. No further needs per CM.

## 2017-10-06 NOTE — Discharge Summary (Signed)
Physician Discharge Summary  [name] [name] QQP:[id number] DOB: 1926-11-21 DOA: 10/02/2017  PCP: [name] Pao, MD  Admit date: 10/02/2017 Discharge date: 10/06/2017  Admitted From: home Disposition:  SNF   Recommendations for Outpatient Follow-up:  1. F/u on cough and oral intake 2. Daily weights 3. Bment in 4 days     Discharge Condition:  stable CODE STATUS:  Full code  Consultations:  Cardiology    Discharge Diagnoses:  Principal Problem:   Acute renal failure superimposed on stage 3 chronic kidney disease (HCC) Active Problems:   Gastroenteritis   Dehydration   Hyperlipidemia   Atrial fibrillation with RVR (HCC)   BPH (benign prostatic hyperplasia)   Sepsis (HCC)   Elevated troponin   COPD (chronic obstructive pulmonary disease) with chronic bronchitis (HCC)   Frequent falls   Dementia without behavioral disturbance   Hypokalemia    Subjective: Noted to be coughing when I entered the room. Per wife this cough is new.   Brief Summary: 83 year old male with history of chronic atrial fibrillation, chronic diastolic congestive heart failure, COPD, diabetes, obesity, hypertension, hyperlipidemia, dementia presented with abdominal pain nausea vomiting and diarrhea. Review of systems Limited because of patient's dementia.   Hospital Course:  Diarrhea  - likely viral gastroenteritis - clinically improved- leukocytosis resolved - has been tolerating diet well.  Cough- h/o COPD with chronic bronchitis -per wife, he has a new cough today- noted to have clear sputum and clear CXR- no hypoxia or wheeze   Hypokalemia - continue potassium chloride replacement. Recommended to monitor labs outpatient.  Elevated troponin - Trop max was 0.03 w  - likely demand ischemia- no chest pain or shortness of breath. Mild elevation in troponin likely due to reduced GFR. Evaluated by cardiology and no further work up recommended - Echocardiogram in January 2018 with EF  of 60-65%.  Dementia without behavioral disturbance,type unknown - Continue supportive care, Aricept.  - Dc to SNF    Chronic atrial fibrillation not on anticoagulation -  Continue metoprolol and full dose aspirin.  Acute kidney injury on chronic kidney disease stage III -Prerenal in the setting of diarrhea and dehydration. Serum creatinine level improved to baseline.  Hyperlipidemia - Continue home medication.  Pyuria- urine culture negative - Discontinued antibiotics.  1/2 bottle positive for coag negative staph bacteremia likely contaminant.     Discharge Instructions  Discharge Instructions    Call MD for:  difficulty breathing, headache or visual disturbances    Complete by:  As directed    Call MD for:  extreme fatigue    Complete by:  As directed    Call MD for:  hives    Complete by:  As directed    Call MD for:  persistant dizziness or light-headedness    Complete by:  As directed    Call MD for:  persistant nausea and vomiting    Complete by:  As directed    Call MD for:  severe uncontrolled pain    Complete by:  As directed    Call MD for:  temperature >100.4    Complete by:  As directed    Diet - low sodium heart healthy    Complete by:  As directed    Diet - low sodium heart healthy    Complete by:  As directed    Increase activity slowly    Complete by:  As directed    Increase activity slowly    Complete by:  As directed  Allergies as of 10/06/2017      Reactions   Sulfa Antibiotics Swelling   Swelling of hands      Medication List    STOP taking these medications   amoxicillin-clavulanate 875-125 MG tablet Commonly known as:  AUGMENTIN     TAKE these medications   aspirin 325 MG tablet Take 1 tablet (325 mg total) by mouth daily. What changed:  when to take this  reasons to take this   atorvastatin 20 MG tablet Commonly known as:  LIPITOR Take 1 tablet (20 mg total) by mouth daily at 6 PM.   donepezil 5 MG  tablet Commonly known as:  ARICEPT Take 5 mg by mouth at bedtime.   finasteride 5 MG tablet Commonly known as:  PROSCAR Take 5 mg by mouth at bedtime.   furosemide 20 MG tablet Commonly known as:  LASIX Take 1 tablet (20 mg total) by mouth every Monday, Wednesday, and Friday.   guaiFENesin 600 MG 12 hr tablet Commonly known as:  MUCINEX Take 1 tablet (600 mg total) by mouth 2 (two) times daily.   Melatonin 10 MG Tabs Take 10 mg by mouth at bedtime.   metoprolol succinate 50 MG 24 hr tablet Commonly known as:  TOPROL-XL Take 1 tablet (50 mg total) by mouth at bedtime. Patient is overdue for an appointment and needs to schedule for further refills 2nd attempt   multivitamin with minerals Tabs tablet Take 1 tablet by mouth daily.   OVER THE COUNTER MEDICATION Apply 1 application topically See admin instructions. Apply over the counter diaper rash cream topically as needed for wound care   potassium chloride SA 20 MEQ tablet Commonly known as:  K-DUR,KLOR-CON Take 1 tablet (20 mEq total) by mouth daily.       Contact information for follow-up providers    Tisovec, Fransico Him, MD. Schedule an appointment as soon as possible for a visit in 1 week(s).   Specialty:  Internal Medicine Contact information: [place] [address] [phone]            Contact information for after-discharge care    Destination    [facility] .   Specialty:  [place] information: [address] [place] [phone]                 Allergies  Allergen Reactions  . Sulfa Antibiotics Swelling    Swelling of hands     Procedures/Studies:    Ct Head Wo Contrast  Result Date: 10/02/2017 CLINICAL DATA:  83 y/o  M; multiple falls.  History of stroke. EXAM: CT HEAD WITHOUT CONTRAST TECHNIQUE: Contiguous axial images were obtained from the base of the skull through the vertex without intravenous  contrast. COMPARISON:  01/02/2017 MRI of the head. FINDINGS: Brain: No evidence of acute infarction, hemorrhage, hydrocephalus, extra-axial collection or mass lesion/mass effect. Areas of encephalomalacia in the right ACA distribution compatible with chronic infarction. Stable advanced chronic microvascular ischemic changes and parenchymal volume loss of the brain given differences in technique. Vascular: Calcific atherosclerosis carotid siphons. No hyperdense vessel identified. Skull: Normal. Negative for fracture or focal lesion. Sinuses/Orbits: Mild maxillary and ethmoid sinus mucosal thickening. Normal aeration of mastoid air cells. Bilateral intra-ocular lens replacement. Other: None. IMPRESSION: 1. No acute intracranial abnormality identified. 2. Areas of chronic infarction in right ACA distribution. 3. Advanced chronic microvascular ischemic changes and parenchymal volume loss of the brain. Electronically Signed   By: Mia [place]  [place]-Stratton M.D.   On: 10/02/2017 20:51   Dg Chest Port 1 View  Result Date: 10/06/2017 CLINICAL DATA:  Pt is confused, rn states pt had cough and congestion since this am, hx afib, htn, no diabetes, past stroke EXAM: PORTABLE CHEST 1 VIEW COMPARISON:  Move 13 18 FINDINGS: The Chin overlies the apices. Midline trachea. Cardiomegaly accentuated by AP portable technique. Atherosclerosis in the transverse aorta. No pleural effusion or pneumothorax. No congestive failure. No lobar consolidation. IMPRESSION: Cardiomegaly, without acute disease. Aortic Atherosclerosis (ICD10-I70.0). Electronically Signed   By: Abigail Miyamoto M.D.   On: 10/06/2017 10:13   Dg Chest Port 1 View  Result Date: 10/02/2017 CLINICAL DATA:  Status post falls. EXAM: PORTABLE CHEST 1 VIEW COMPARISON:  01/01/2017 FINDINGS: Cardiac silhouette is mildly enlarged. Calcific atherosclerotic disease of the aorta. Mediastinal contours appear intact. There is no evidence of focal airspace consolidation, pleural  effusion or pneumothorax. Low lung volumes. Osseous structures are without acute abnormality. Soft tissues are grossly normal. IMPRESSION: Low lung volumes. No evidence of lobar consolidation or pulmonary edema. Calcific atherosclerotic disease of the aorta. Electronically Signed   By: Fidela Salisbury M.D.   On: 10/02/2017 20:20       Discharge Exam: Vitals:   10/06/17 0359 10/06/17 1015  BP: 125/74 130/75  Pulse: 71 65  Resp: 17 18  Temp: 98.2 F (36.8 C) 98.3 F (36.8 C)  SpO2: 96%    Vitals:   10/05/17 1811 10/05/17 2035 10/06/17 0359 10/06/17 1015  BP: 135/81 126/76 125/74 130/75  Pulse: 99 89 71 65  Resp: 18 18 17 18   Temp: 98.2 F (36.8 C) 98.4 F (36.9 C) 98.2 F (36.8 C) 98.3 F (36.8 C)  TempSrc: Oral Oral Oral Oral  SpO2: 94% 94% 96%   Weight:  81 kg (178 lb 9.2 oz)    Height:        General: Pt is alert, awake, not in acute distress, confused Cardiovascular: RRR, S1/S2 +, no rubs, no gallops Respiratory: CTA bilaterally, no wheezing, no rhonchi Abdominal: Soft, NT, ND, bowel sounds + Extremities: no edema, no cyanosis    The results of significant diagnostics from this hospitalization (including imaging, microbiology, ancillary and laboratory) are listed below for reference.     Microbiology: Recent Results (from the past 240 hour(s))  Culture, blood (x 2)     Status: Abnormal   Collection Time: 10/03/17 12:01 AM  Result Value Ref Range Status   Specimen Description BLOOD RIGHT ARM  Final   Special Requests   Final    BOTTLES DRAWN AEROBIC AND ANAEROBIC Blood Culture adequate volume   Culture  Setup Time   Final    GRAM POSITIVE COCCI IN CLUSTERS AEROBIC BOTTLE ONLY CRITICAL RESULT CALLED TO, READ BACK BY AND VERIFIED WITH: Dion Body PHARMD 1937 10/04/17 A BROWNING    Culture (A)  Final    STAPHYLOCOCCUS SPECIES (COAGULASE NEGATIVE) THE SIGNIFICANCE OF ISOLATING THIS ORGANISM FROM A SINGLE SET OF BLOOD CULTURES WHEN MULTIPLE SETS ARE DRAWN IS  UNCERTAIN. PLEASE NOTIFY THE MICROBIOLOGY DEPARTMENT WITHIN ONE WEEK IF SPECIATION AND SENSITIVITIES ARE REQUIRED.    Report Status 10/05/2017 FINAL  Final  Blood Culture ID Panel (Reflexed)     Status: Abnormal   Collection Time: 10/03/17 12:01 AM  Result Value Ref Range Status   Enterococcus species NOT DETECTED NOT DETECTED Final   Listeria monocytogenes NOT DETECTED NOT DETECTED Final   Staphylococcus species DETECTED (A) NOT DETECTED Final    Comment: Methicillin (  oxacillin) resistant coagulase negative staphylococcus. Possible blood culture contaminant (unless isolated from more than one blood culture draw or clinical case suggests pathogenicity). No antibiotic treatment is indicated for blood  culture contaminants. CRITICAL RESULT CALLED TO, READ BACK BY AND VERIFIED WITH: Dion Body PHARMD [id number] 10/04/17 A BROWNING    Staphylococcus aureus NOT DETECTED NOT DETECTED Final   Methicillin resistance DETECTED (A) NOT DETECTED Final    Comment: CRITICAL RESULT CALLED TO, READ BACK BY AND VERIFIED WITH: Dion Body PHARMD 1937 10/04/17 A BROWNING    Streptococcus species NOT DETECTED NOT DETECTED Final   Streptococcus agalactiae NOT DETECTED NOT DETECTED Final   Streptococcus pneumoniae NOT DETECTED NOT DETECTED Final   Streptococcus pyogenes NOT DETECTED NOT DETECTED Final   Acinetobacter baumannii NOT DETECTED NOT DETECTED Final   Enterobacteriaceae species NOT DETECTED NOT DETECTED Final   Enterobacter cloacae complex NOT DETECTED NOT DETECTED Final   Escherichia coli NOT DETECTED NOT DETECTED Final   Klebsiella oxytoca NOT DETECTED NOT DETECTED Final   Klebsiella pneumoniae NOT DETECTED NOT DETECTED Final   Proteus species NOT DETECTED NOT DETECTED Final   Serratia marcescens NOT DETECTED NOT DETECTED Final   Haemophilus influenzae NOT DETECTED NOT DETECTED Final   Neisseria meningitidis NOT DETECTED NOT DETECTED Final   Pseudomonas aeruginosa NOT DETECTED NOT DETECTED Final    Candida albicans NOT DETECTED NOT DETECTED Final   Candida glabrata NOT DETECTED NOT DETECTED Final   Candida krusei NOT DETECTED NOT DETECTED Final   Candida parapsilosis NOT DETECTED NOT DETECTED Final   Candida tropicalis NOT DETECTED NOT DETECTED Final  Culture, blood (x 2)     Status: None (Preliminary result)   Collection Time: 10/03/17 12:29 AM  Result Value Ref Range Status   Specimen Description BLOOD RIGHT HAND  Final   Special Requests   Final    BOTTLES DRAWN AEROBIC AND ANAEROBIC Blood Culture adequate volume   Culture NO GROWTH 2 DAYS  Final   Report Status PENDING  Incomplete  Culture, Urine     Status: None   Collection Time: 10/03/17 11:00 AM  Result Value Ref Range Status   Specimen Description URINE, RANDOM  Final   Special Requests NONE  Final   Culture NO GROWTH  Final   Report Status 10/04/2017 FINAL  Final     Labs: BNP (last 3 results)  Recent Labs  10/02/17 2027  BNP 350.0*   Basic Metabolic Panel:  Recent Labs Lab 10/02/17 1832 10/02/17 2131 10/03/17 0259 10/04/17 0312 10/06/17 1006  NA 128*  --  134* 135 134*  K 3.1*  --  3.4* 3.4* 3.9  CL 93*  --  100* 102 105  CO2 22  --  22 22 19*  GLUCOSE 196*  --  175* 128* 160*  BUN 30*  --  28* 23* 19  CREATININE 2.12*  --  1.78* 1.54* 1.44*  CALCIUM 8.6*  --  8.7* 8.2* 8.6*  MG  --  2.0 2.0  --   --   PHOS  --  2.8 2.8  --   --    Liver Function Tests:  Recent Labs Lab 10/02/17 1832 10/03/17 0259  AST 48* 32  ALT 32 32  ALKPHOS 110 89  BILITOT 1.4* 1.7*  PROT 7.8 6.9  ALBUMIN 3.3* 3.1*    Recent Labs Lab 10/02/17 1832  LIPASE 45   No results for input(s): AMMONIA in the last 168 hours. CBC:  Recent Labs Lab 10/02/17 1832 10/03/17 0259 10/04/17  1696 10/06/17 1006  WBC 26.3* 20.0* 16.4* 8.6  HGB 14.4 13.5 12.1* 12.5*  HCT 42.1 38.9* 36.0* 37.0*  MCV 94.4 93.3 93.5 93.0  PLT 231 178 183 213   Cardiac Enzymes:  Recent Labs Lab 10/02/17 1832 10/02/17 2131  10/03/17 0259  TROPONINI <0.03 0.03* <0.03   BNP: Invalid input(s): POCBNP CBG:  Recent Labs Lab 10/05/17 2034 10/06/17 0015 10/06/17 0354 10/06/17 0743 10/06/17 1127  GLUCAP 203* 126* 101* 112* 139*   D-Dimer No results for input(s): DDIMER in the last 72 hours. Hgb A1c No results for input(s): HGBA1C in the last 72 hours. Lipid Profile No results for input(s): CHOL, HDL, LDLCALC, TRIG, CHOLHDL, LDLDIRECT in the last 72 hours. Thyroid function studies No results for input(s): TSH, T4TOTAL, T3FREE, THYROIDAB in the last 72 hours.  Invalid input(s): FREET3 Anemia work up No results for input(s): VITAMINB12, FOLATE, FERRITIN, TIBC, IRON, RETICCTPCT in the last 72 hours. Urinalysis    Component Value Date/Time   COLORURINE YELLOW 10/03/2017 [place] 10/03/2017 0644   LABSPEC 1.017 10/03/2017 0644   PHURINE 6.0 10/03/2017 0644   GLUCOSEU NEGATIVE 10/03/2017 0644   HGBUR NEGATIVE 10/03/2017 0644   BILIRUBINUR NEGATIVE 10/03/2017 0644   KETONESUR 5 (A) 10/03/2017 0644   PROTEINUR 100 (A) 10/03/2017 0644   UROBILINOGEN 1.0 01/21/2012 2347   NITRITE NEGATIVE 10/03/2017 0644   LEUKOCYTESUR NEGATIVE 10/03/2017 0644   Sepsis Labs Invalid input(s): PROCALCITONIN,  WBC,  LACTICIDVEN Microbiology Recent Results (from the past 240 hour(s))  Culture, blood (x 2)     Status: Abnormal   Collection Time: 10/03/17 12:01 AM  Result Value Ref Range Status   Specimen Description BLOOD RIGHT ARM  Final   Special Requests   Final    BOTTLES DRAWN AEROBIC AND ANAEROBIC Blood Culture adequate volume   Culture  Setup Time   Final    GRAM POSITIVE COCCI IN CLUSTERS AEROBIC BOTTLE ONLY CRITICAL RESULT CALLED TO, READ BACK BY AND VERIFIED WITH: Dion Body PHARMD 1937 10/04/17 A BROWNING    Culture (A)  Final    STAPHYLOCOCCUS SPECIES (COAGULASE NEGATIVE) THE SIGNIFICANCE OF ISOLATING THIS ORGANISM FROM A SINGLE SET OF BLOOD CULTURES WHEN MULTIPLE SETS ARE DRAWN IS  UNCERTAIN. PLEASE NOTIFY THE MICROBIOLOGY DEPARTMENT WITHIN ONE WEEK IF SPECIATION AND SENSITIVITIES ARE REQUIRED.    Report Status 10/05/2017 FINAL  Final  Blood Culture ID Panel (Reflexed)     Status: Abnormal   Collection Time: 10/03/17 12:01 AM  Result Value Ref Range Status   Enterococcus species NOT DETECTED NOT DETECTED Final   Listeria monocytogenes NOT DETECTED NOT DETECTED Final   Staphylococcus species DETECTED (A) NOT DETECTED Final    Comment: Methicillin (oxacillin) resistant coagulase negative staphylococcus. Possible blood culture contaminant (unless isolated from more than one blood culture draw or clinical case suggests pathogenicity). No antibiotic treatment is indicated for blood  culture contaminants. CRITICAL RESULT CALLED TO, READ BACK BY AND VERIFIED WITH: Dion Body PHARMD [id number] 10/04/17 A BROWNING    Staphylococcus aureus NOT DETECTED NOT DETECTED Final   Methicillin resistance DETECTED (A) NOT DETECTED Final    Comment: CRITICAL RESULT CALLED TO, READ BACK BY AND VERIFIED WITH: Dion Body PHARMD 1937 10/04/17 A BROWNING    Streptococcus species NOT DETECTED NOT DETECTED Final   Streptococcus agalactiae NOT DETECTED NOT DETECTED Final   Streptococcus pneumoniae NOT DETECTED NOT DETECTED Final   Streptococcus pyogenes NOT DETECTED NOT DETECTED Final   Acinetobacter baumannii NOT DETECTED NOT DETECTED  Final   Enterobacteriaceae species NOT DETECTED NOT DETECTED Final   Enterobacter cloacae complex NOT DETECTED NOT DETECTED Final   Escherichia coli NOT DETECTED NOT DETECTED Final   Klebsiella oxytoca NOT DETECTED NOT DETECTED Final   Klebsiella pneumoniae NOT DETECTED NOT DETECTED Final   Proteus species NOT DETECTED NOT DETECTED Final   Serratia marcescens NOT DETECTED NOT DETECTED Final   Haemophilus influenzae NOT DETECTED NOT DETECTED Final   Neisseria meningitidis NOT DETECTED NOT DETECTED Final   Pseudomonas aeruginosa NOT DETECTED NOT DETECTED Final    Candida albicans NOT DETECTED NOT DETECTED Final   Candida glabrata NOT DETECTED NOT DETECTED Final   Candida krusei NOT DETECTED NOT DETECTED Final   Candida parapsilosis NOT DETECTED NOT DETECTED Final   Candida tropicalis NOT DETECTED NOT DETECTED Final  Culture, blood (x 2)     Status: None (Preliminary result)   Collection Time: 10/03/17 12:29 AM  Result Value Ref Range Status   Specimen Description BLOOD RIGHT HAND  Final   Special Requests   Final    BOTTLES DRAWN AEROBIC AND ANAEROBIC Blood Culture adequate volume   Culture NO GROWTH 2 DAYS  Final   Report Status PENDING  Incomplete  Culture, Urine     Status: None   Collection Time: 10/03/17 11:00 AM  Result Value Ref Range Status   Specimen Description URINE, RANDOM  Final   Special Requests NONE  Final   Culture NO GROWTH  Final   Report Status 10/04/2017 FINAL  Final     Time coordinating discharge: Over 30 minutes  SIGNED:   Debbe Odea, MD  [facility] 10/06/2017, 11:42 AM Pager   If 7PM-7AM, please contact night-coverage [email or web address] Password [email or web address]

## 2017-10-06 NOTE — Clinical Social Work Placement (Signed)
   CLINICAL SOCIAL WORK PLACEMENT  NOTE 10/06/17 DISCHARGED TO [facility]IA [facility]  Date:  10/06/2017  Patient Details  Name: [name] [name] MRN: [id number] Date of Birth: [birth year]  Clinical Social Work is seeking post-discharge placement for this patient at the [place] level of care (*CSW will initial, date and re-position this form in  chart as items are completed):  No (Wife provided facility preference)   Patient/family provided with [place] Work Department's list of facilities offering this level of care within the geographic area requested by the patient (or if unable, by the patient's family).  Yes   Patient/family informed of their freedom to choose among providers that offer the needed level of care, that participate in Medicare, Medicaid or managed care program needed by the patient, have an available bed and are willing to accept the patient.  No   Patient/family informed of [facility]'s ownership interest in [facility] and [facility], as well as of the fact that they are under no obligation to receive care at these facilities.  PASRR submitted to EDS on       PASRR number received on       Existing PASRR number confirmed on 10/05/17     FL2 transmitted to all facilities in geographic area requested by pt/family on 10/05/17     FL2 transmitted to all facilities within larger geographic area on       Patient informed that his/her managed care company has contracts with or will negotiate with certain facilities, including the following:        Yes   Patient/family informed of bed offers received.  Patient chooses bed at [facility]     Physician recommends and patient chooses bed at      Patient to be transferred to [facility] on 10/06/17.  Patient to be transferred to facility by Ambulance     Patient family notified on 10/06/17 of transfer.  Name of family member notified:  Wife, Phuong Moffatt      PHYSICIAN       Additional Comment: Authorization information on 10/16 placement note   _______________________________________________ Sable Feil, LCSW 10/06/2017, 6:02 PM

## 2017-10-06 NOTE — Progress Notes (Signed)
Patient discharged to [facility] via [facility].  IV removed, Telemetry removed. Wife notified that patient was discharged to [facility].    Sheliah Plane RN

## 2017-10-07 DIAGNOSIS — J449 Chronic obstructive pulmonary disease, unspecified: Secondary | ICD-10-CM | POA: Diagnosis not present

## 2017-10-07 DIAGNOSIS — I10 Essential (primary) hypertension: Secondary | ICD-10-CM | POA: Diagnosis not present

## 2017-10-07 DIAGNOSIS — I5032 Chronic diastolic (congestive) heart failure: Secondary | ICD-10-CM | POA: Diagnosis not present

## 2017-10-07 DIAGNOSIS — I4891 Unspecified atrial fibrillation: Secondary | ICD-10-CM | POA: Diagnosis not present

## 2017-10-08 LAB — CULTURE, BLOOD (ROUTINE X 2)
CULTURE: NO GROWTH
Special Requests: ADEQUATE

## 2017-10-12 DIAGNOSIS — F0390 Unspecified dementia without behavioral disturbance: Secondary | ICD-10-CM | POA: Diagnosis not present

## 2017-10-12 DIAGNOSIS — Z7409 Other reduced mobility: Secondary | ICD-10-CM | POA: Diagnosis not present

## 2017-10-12 DIAGNOSIS — N400 Enlarged prostate without lower urinary tract symptoms: Secondary | ICD-10-CM | POA: Diagnosis not present

## 2017-10-12 DIAGNOSIS — I5032 Chronic diastolic (congestive) heart failure: Secondary | ICD-10-CM | POA: Diagnosis not present

## 2017-10-15 DIAGNOSIS — I4891 Unspecified atrial fibrillation: Secondary | ICD-10-CM | POA: Diagnosis not present

## 2017-10-15 DIAGNOSIS — F0390 Unspecified dementia without behavioral disturbance: Secondary | ICD-10-CM | POA: Diagnosis not present

## 2017-10-15 DIAGNOSIS — I5032 Chronic diastolic (congestive) heart failure: Secondary | ICD-10-CM | POA: Diagnosis not present

## 2017-10-15 DIAGNOSIS — J449 Chronic obstructive pulmonary disease, unspecified: Secondary | ICD-10-CM | POA: Diagnosis not present

## 2017-10-21 DIAGNOSIS — I10 Essential (primary) hypertension: Secondary | ICD-10-CM | POA: Diagnosis not present

## 2017-10-21 DIAGNOSIS — I5032 Chronic diastolic (congestive) heart failure: Secondary | ICD-10-CM | POA: Diagnosis not present

## 2017-10-21 DIAGNOSIS — I4891 Unspecified atrial fibrillation: Secondary | ICD-10-CM | POA: Diagnosis not present

## 2017-10-21 DIAGNOSIS — F0390 Unspecified dementia without behavioral disturbance: Secondary | ICD-10-CM | POA: Diagnosis not present

## 2017-10-22 DIAGNOSIS — J449 Chronic obstructive pulmonary disease, unspecified: Secondary | ICD-10-CM | POA: Diagnosis not present

## 2017-10-22 DIAGNOSIS — R5381 Other malaise: Secondary | ICD-10-CM | POA: Diagnosis not present

## 2017-10-22 DIAGNOSIS — I4891 Unspecified atrial fibrillation: Secondary | ICD-10-CM | POA: Diagnosis not present

## 2017-10-22 DIAGNOSIS — E669 Obesity, unspecified: Secondary | ICD-10-CM | POA: Diagnosis not present

## 2017-10-22 DIAGNOSIS — I5032 Chronic diastolic (congestive) heart failure: Secondary | ICD-10-CM | POA: Diagnosis not present

## 2017-10-22 DIAGNOSIS — M6281 Muscle weakness (generalized): Secondary | ICD-10-CM | POA: Diagnosis not present

## 2017-10-22 DIAGNOSIS — I5030 Unspecified diastolic (congestive) heart failure: Secondary | ICD-10-CM | POA: Diagnosis not present

## 2017-10-26 DIAGNOSIS — N400 Enlarged prostate without lower urinary tract symptoms: Secondary | ICD-10-CM | POA: Diagnosis not present

## 2017-10-26 DIAGNOSIS — F0390 Unspecified dementia without behavioral disturbance: Secondary | ICD-10-CM | POA: Diagnosis not present

## 2017-10-26 DIAGNOSIS — I10 Essential (primary) hypertension: Secondary | ICD-10-CM | POA: Diagnosis not present

## 2017-11-04 DIAGNOSIS — I480 Paroxysmal atrial fibrillation: Secondary | ICD-10-CM | POA: Diagnosis not present

## 2017-11-04 DIAGNOSIS — I130 Hypertensive heart and chronic kidney disease with heart failure and stage 1 through stage 4 chronic kidney disease, or unspecified chronic kidney disease: Secondary | ICD-10-CM | POA: Diagnosis not present

## 2017-11-04 DIAGNOSIS — E7800 Pure hypercholesterolemia, unspecified: Secondary | ICD-10-CM | POA: Diagnosis not present

## 2017-11-04 DIAGNOSIS — N183 Chronic kidney disease, stage 3 (moderate): Secondary | ICD-10-CM | POA: Diagnosis not present

## 2017-11-16 DIAGNOSIS — I130 Hypertensive heart and chronic kidney disease with heart failure and stage 1 through stage 4 chronic kidney disease, or unspecified chronic kidney disease: Secondary | ICD-10-CM | POA: Diagnosis not present

## 2017-11-16 DIAGNOSIS — I69354 Hemiplegia and hemiparesis following cerebral infarction affecting left non-dominant side: Secondary | ICD-10-CM | POA: Diagnosis not present

## 2017-11-16 DIAGNOSIS — N183 Chronic kidney disease, stage 3 (moderate): Secondary | ICD-10-CM | POA: Diagnosis not present

## 2017-11-16 DIAGNOSIS — M1990 Unspecified osteoarthritis, unspecified site: Secondary | ICD-10-CM | POA: Diagnosis not present

## 2017-11-16 DIAGNOSIS — I5032 Chronic diastolic (congestive) heart failure: Secondary | ICD-10-CM | POA: Diagnosis not present

## 2017-11-16 DIAGNOSIS — E1122 Type 2 diabetes mellitus with diabetic chronic kidney disease: Secondary | ICD-10-CM | POA: Diagnosis not present

## 2017-11-21 DIAGNOSIS — E669 Obesity, unspecified: Secondary | ICD-10-CM | POA: Diagnosis not present

## 2017-11-21 DIAGNOSIS — J449 Chronic obstructive pulmonary disease, unspecified: Secondary | ICD-10-CM | POA: Diagnosis not present

## 2017-11-21 DIAGNOSIS — I5030 Unspecified diastolic (congestive) heart failure: Secondary | ICD-10-CM | POA: Diagnosis not present

## 2017-11-21 DIAGNOSIS — M6281 Muscle weakness (generalized): Secondary | ICD-10-CM | POA: Diagnosis not present

## 2017-11-26 DIAGNOSIS — I509 Heart failure, unspecified: Secondary | ICD-10-CM | POA: Diagnosis not present

## 2017-11-26 DIAGNOSIS — E1129 Type 2 diabetes mellitus with other diabetic kidney complication: Secondary | ICD-10-CM | POA: Diagnosis not present

## 2017-11-26 DIAGNOSIS — I130 Hypertensive heart and chronic kidney disease with heart failure and stage 1 through stage 4 chronic kidney disease, or unspecified chronic kidney disease: Secondary | ICD-10-CM | POA: Diagnosis not present

## 2017-11-26 DIAGNOSIS — M6289 Other specified disorders of muscle: Secondary | ICD-10-CM | POA: Diagnosis not present

## 2017-12-28 DIAGNOSIS — R350 Frequency of micturition: Secondary | ICD-10-CM | POA: Diagnosis not present

## 2017-12-28 DIAGNOSIS — N3942 Incontinence without sensory awareness: Secondary | ICD-10-CM | POA: Diagnosis not present

## 2017-12-28 DIAGNOSIS — C61 Malignant neoplasm of prostate: Secondary | ICD-10-CM | POA: Diagnosis not present

## 2018-01-04 ENCOUNTER — Ambulatory Visit (INDEPENDENT_AMBULATORY_CARE_PROVIDER_SITE_OTHER): Payer: Medicare Other | Admitting: Sports Medicine

## 2018-01-04 ENCOUNTER — Encounter: Payer: Self-pay | Admitting: Sports Medicine

## 2018-01-04 VITALS — Resp 16

## 2018-01-04 DIAGNOSIS — M795 Residual foreign body in soft tissue: Secondary | ICD-10-CM

## 2018-01-04 DIAGNOSIS — B351 Tinea unguium: Secondary | ICD-10-CM | POA: Diagnosis not present

## 2018-01-04 DIAGNOSIS — R531 Weakness: Secondary | ICD-10-CM | POA: Diagnosis not present

## 2018-01-04 DIAGNOSIS — I739 Peripheral vascular disease, unspecified: Secondary | ICD-10-CM

## 2018-01-04 DIAGNOSIS — F0150 Vascular dementia without behavioral disturbance: Secondary | ICD-10-CM | POA: Diagnosis not present

## 2018-01-04 DIAGNOSIS — R296 Repeated falls: Secondary | ICD-10-CM

## 2018-01-04 NOTE — Progress Notes (Signed)
Subjective: BRODY BONNEAU is a 84 y.o. male patient who presents to office for follow up eval of removal of glass on right foot and for foot check. Wife states that she wanted to make sure things were ok since they never followed up since September due to her husband getting sick and was admitted to hospital and then had to go to nursing home. Wife states that she wanted to make sure that he did not have any more glass in the foot and to follow up because was screaming because the quilt was too heavy on toes last night. Patient denies any symptoms or pain at this time. Denies current pain, nausea, vomiting, fever, chills, or open wounds.   Patient Active Problem List   Diagnosis Date Noted  . Gastroenteritis 10/06/2017  . Hypokalemia 10/02/2017  . Dehydration 10/02/2017  . Cerebral embolism with cerebral infarction 01/04/2017  . Dementia without behavioral disturbance   . Falls 01/03/2017  . Generalized weakness 01/01/2017  . Frequent falls 01/01/2017  . Acute hypoxemic respiratory failure ([place]) 04/09/2016  . COPD (chronic obstructive pulmonary disease) with chronic bronchitis ([place]) 03/12/2016  . HCAP (healthcare-associated pneumonia) 02/26/2016  . Elevated troponin 02/26/2016  . Elevated lactic acid level 01/28/2016  . Sepsis ([place]) 01/28/2016  . Acute renal failure superimposed on stage 3 chronic kidney disease ([place]) 04/21/2015  . Hyperglycemia 04/21/2015  . Bronchitis 04/21/2015  . Tachycardia 04/21/2015  . BPH (benign prostatic hyperplasia) 04/21/2015  . Dyspnea 12/28/2012  . Fall 12/28/2012  . OTHER DYSPNEA AND RESPIRATORY ABNORMALITIES 05/27/2010  . Hyperlipidemia 11/27/2008  . HYPERTENSION, BENIGN 11/27/2008  . Atrial fibrillation with RVR ([place]) 11/27/2008  . GERD 11/27/2008  . BPH/LUTS W/O OBSTRUCTION 11/27/2008  . ARTHRITIS 11/27/2008    Current Outpatient Medications on File Prior to Visit  Medication Sig Dispense Refill  . aspirin 325 MG tablet Take 1 tablet (325 mg  total) by mouth daily. (Patient taking differently: Take 325 mg by mouth daily as needed for headache (pain). ) 30 tablet 0  . atorvastatin (LIPITOR) 20 MG tablet Take 1 tablet (20 mg total) by mouth daily at 6 PM. 30 tablet 0  . donepezil (ARICEPT) 5 MG tablet Take 5 mg by mouth at bedtime.   6  . finasteride (PROSCAR) 5 MG tablet Take 5 mg by mouth at bedtime.     . furosemide (LASIX) 20 MG tablet Take 1 tablet (20 mg total) by mouth every Monday, Wednesday, and Friday. 90 tablet 3  . guaiFENesin (MUCINEX) 600 MG 12 hr tablet Take 1 tablet (600 mg total) by mouth 2 (two) times daily. 30 tablet 0  . Melatonin 10 MG TABS Take 10 mg by mouth at bedtime.    . metoprolol succinate (TOPROL-XL) 50 MG 24 hr tablet Take 1 tablet (50 mg total) by mouth at bedtime. Patient is overdue for an appointment and needs to schedule for further refills 2nd attempt 15 tablet 0  . Multiple Vitamin (MULTIVITAMIN WITH MINERALS) TABS tablet Take 1 tablet by mouth daily.    Marland Kitchen OVER THE COUNTER MEDICATION Apply 1 application topically See admin instructions. Apply over the counter diaper rash cream topically as needed for wound care    . potassium chloride SA (K-DUR,KLOR-CON) 20 MEQ tablet Take 1 tablet (20 mEq total) by mouth daily. 20 tablet 0   No current facility-administered medications on file prior to visit.     Allergies  Allergen Reactions  . Sulfa Antibiotics Swelling    Swelling of hands  Objective:  General: Alert and oriented x3 in no acute distress  Dermatology:No wounds, all nails x 10 are thick, yellow, mycotic and mildly elongated.   Vascular: Dorsalis Pedis and Posterior Tibial pedal pulses not palpable, Capillary Fill Time >5seconds,(-) pedal hair growth bilateral, purple discoloration to both feet and legs. Temperature gradient severely decreased bilateral.   Neurology: Gross sensation intact via light touch bilateral.  Musculoskeletal: No tenderness with palpation bilateral. Strength 4/5  in all groups bilateral.   Assessment and Plan: Problem List Items Addressed This Visit      Nervous and Auditory   Dementia without behavioral disturbance     Other   Generalized weakness   Frequent falls    Other Visit Diagnoses    Dermatophytosis of nail    -  Primary   PVD (peripheral vascular disease) ([place])       Residual foreign body in soft tissue       Resolved       -Complete examination performed -Discussed importance of routine foot care since vascular status is poor -Complimentary nail trim was also performed for patient at today's visit -Recommend patient that we will consider sooner cardiology or vascular follow up if circulation worsens; will continue to monitor at this time -Recommend to refrain from heavy quilts/bedding on feet and bedtime  -Patient to return to office in 10 weeks for routine foot care or sooner if condition worsens.  Landis Martins, DPM

## 2018-01-15 DIAGNOSIS — M1990 Unspecified osteoarthritis, unspecified site: Secondary | ICD-10-CM | POA: Diagnosis not present

## 2018-01-15 DIAGNOSIS — I130 Hypertensive heart and chronic kidney disease with heart failure and stage 1 through stage 4 chronic kidney disease, or unspecified chronic kidney disease: Secondary | ICD-10-CM | POA: Diagnosis not present

## 2018-01-15 DIAGNOSIS — I69354 Hemiplegia and hemiparesis following cerebral infarction affecting left non-dominant side: Secondary | ICD-10-CM | POA: Diagnosis not present

## 2018-01-15 DIAGNOSIS — E1122 Type 2 diabetes mellitus with diabetic chronic kidney disease: Secondary | ICD-10-CM | POA: Diagnosis not present

## 2018-01-17 DIAGNOSIS — I5032 Chronic diastolic (congestive) heart failure: Secondary | ICD-10-CM | POA: Diagnosis not present

## 2018-01-17 DIAGNOSIS — I69354 Hemiplegia and hemiparesis following cerebral infarction affecting left non-dominant side: Secondary | ICD-10-CM | POA: Diagnosis not present

## 2018-01-17 DIAGNOSIS — N183 Chronic kidney disease, stage 3 (moderate): Secondary | ICD-10-CM | POA: Diagnosis not present

## 2018-01-17 DIAGNOSIS — E1122 Type 2 diabetes mellitus with diabetic chronic kidney disease: Secondary | ICD-10-CM | POA: Diagnosis not present

## 2018-01-17 DIAGNOSIS — M1990 Unspecified osteoarthritis, unspecified site: Secondary | ICD-10-CM | POA: Diagnosis not present

## 2018-01-17 DIAGNOSIS — I130 Hypertensive heart and chronic kidney disease with heart failure and stage 1 through stage 4 chronic kidney disease, or unspecified chronic kidney disease: Secondary | ICD-10-CM | POA: Diagnosis not present

## 2018-02-09 DIAGNOSIS — N183 Chronic kidney disease, stage 3 (moderate): Secondary | ICD-10-CM | POA: Diagnosis not present

## 2018-02-09 DIAGNOSIS — M859 Disorder of bone density and structure, unspecified: Secondary | ICD-10-CM | POA: Diagnosis not present

## 2018-02-09 DIAGNOSIS — I130 Hypertensive heart and chronic kidney disease with heart failure and stage 1 through stage 4 chronic kidney disease, or unspecified chronic kidney disease: Secondary | ICD-10-CM | POA: Diagnosis not present

## 2018-02-09 DIAGNOSIS — E1129 Type 2 diabetes mellitus with other diabetic kidney complication: Secondary | ICD-10-CM | POA: Diagnosis not present

## 2018-02-09 DIAGNOSIS — E7800 Pure hypercholesterolemia, unspecified: Secondary | ICD-10-CM | POA: Diagnosis not present

## 2018-02-09 DIAGNOSIS — Z0000 Encounter for general adult medical examination without abnormal findings: Secondary | ICD-10-CM | POA: Diagnosis not present

## 2018-02-09 DIAGNOSIS — Z125 Encounter for screening for malignant neoplasm of prostate: Secondary | ICD-10-CM | POA: Diagnosis not present

## 2018-02-28 ENCOUNTER — Other Ambulatory Visit: Payer: Self-pay

## 2018-02-28 ENCOUNTER — Emergency Department (HOSPITAL_COMMUNITY)
Admission: EM | Admit: 2018-02-28 | Discharge: 2018-02-28 | Disposition: A | Discharge status: Home / Self Care | Payer: Medicare Other | Attending: Emergency Medicine | Admitting: Emergency Medicine

## 2018-02-28 ENCOUNTER — Encounter (HOSPITAL_COMMUNITY): Payer: Self-pay

## 2018-02-28 ENCOUNTER — Emergency Department (HOSPITAL_COMMUNITY): Payer: Medicare Other

## 2018-02-28 DIAGNOSIS — R531 Weakness: Secondary | ICD-10-CM

## 2018-02-28 DIAGNOSIS — N183 Chronic kidney disease, stage 3 (moderate): Secondary | ICD-10-CM | POA: Insufficient documentation

## 2018-02-28 DIAGNOSIS — Z79899 Other long term (current) drug therapy: Secondary | ICD-10-CM | POA: Diagnosis not present

## 2018-02-28 DIAGNOSIS — I129 Hypertensive chronic kidney disease with stage 1 through stage 4 chronic kidney disease, or unspecified chronic kidney disease: Secondary | ICD-10-CM | POA: Insufficient documentation

## 2018-02-28 DIAGNOSIS — R404 Transient alteration of awareness: Secondary | ICD-10-CM | POA: Diagnosis not present

## 2018-02-28 DIAGNOSIS — E785 Hyperlipidemia, unspecified: Secondary | ICD-10-CM | POA: Diagnosis not present

## 2018-02-28 DIAGNOSIS — F0390 Unspecified dementia without behavioral disturbance: Secondary | ICD-10-CM | POA: Diagnosis not present

## 2018-02-28 DIAGNOSIS — R05 Cough: Secondary | ICD-10-CM | POA: Insufficient documentation

## 2018-02-28 DIAGNOSIS — Z87891 Personal history of nicotine dependence: Secondary | ICD-10-CM | POA: Insufficient documentation

## 2018-02-28 DIAGNOSIS — E1122 Type 2 diabetes mellitus with diabetic chronic kidney disease: Secondary | ICD-10-CM | POA: Insufficient documentation

## 2018-02-28 DIAGNOSIS — J449 Chronic obstructive pulmonary disease, unspecified: Secondary | ICD-10-CM | POA: Insufficient documentation

## 2018-02-28 DIAGNOSIS — I482 Chronic atrial fibrillation: Secondary | ICD-10-CM | POA: Insufficient documentation

## 2018-02-28 DIAGNOSIS — Z8673 Personal history of transient ischemic attack (TIA), and cerebral infarction without residual deficits: Secondary | ICD-10-CM | POA: Insufficient documentation

## 2018-02-28 LAB — COMPREHENSIVE METABOLIC PANEL WITH GFR
ALT: 14 U/L — ABNORMAL LOW (ref 17–63)
AST: 23 U/L (ref 15–41)
Albumin: 4 g/dL (ref 3.5–5.0)
Alkaline Phosphatase: 77 U/L (ref 38–126)
Anion gap: 9 (ref 5–15)
BUN: 21 mg/dL — ABNORMAL HIGH (ref 6–20)
CO2: 29 mmol/L (ref 22–32)
Calcium: 9.4 mg/dL (ref 8.9–10.3)
Chloride: 104 mmol/L (ref 101–111)
Creatinine, Ser: 1.66 mg/dL — ABNORMAL HIGH (ref 0.61–1.24)
GFR calc Af Amer: 40 mL/min — ABNORMAL LOW
GFR calc non Af Amer: 35 mL/min — ABNORMAL LOW
Glucose, Bld: 173 mg/dL — ABNORMAL HIGH (ref 65–99)
Potassium: 4.4 mmol/L (ref 3.5–5.1)
Sodium: 142 mmol/L (ref 135–145)
Total Bilirubin: 0.5 mg/dL (ref 0.3–1.2)
Total Protein: 7.5 g/dL (ref 6.5–8.1)

## 2018-02-28 LAB — I-STAT TROPONIN, ED: TROPONIN I, POC: 0 ng/mL (ref 0.00–0.08)

## 2018-02-28 LAB — CBC WITH DIFFERENTIAL/PLATELET
Basophils Absolute: 0 K/uL (ref 0.0–0.1)
Basophils Relative: 0 %
Eosinophils Absolute: 0.3 K/uL (ref 0.0–0.7)
Eosinophils Relative: 4 %
HCT: 43 % (ref 39.0–52.0)
Hemoglobin: 14.5 g/dL (ref 13.0–17.0)
Lymphocytes Relative: 24 %
Lymphs Abs: 2.2 K/uL (ref 0.7–4.0)
MCH: 32.5 pg (ref 26.0–34.0)
MCHC: 33.7 g/dL (ref 30.0–36.0)
MCV: 96.4 fL (ref 78.0–100.0)
Monocytes Absolute: 0.7 K/uL (ref 0.1–1.0)
Monocytes Relative: 8 %
Neutro Abs: 5.8 K/uL (ref 1.7–7.7)
Neutrophils Relative %: 64 %
Platelets: 201 K/uL (ref 150–400)
RBC: 4.46 MIL/uL (ref 4.22–5.81)
RDW: 13.9 % (ref 11.5–15.5)
WBC: 9 K/uL (ref 4.0–10.5)

## 2018-02-28 LAB — URINALYSIS, ROUTINE W REFLEX MICROSCOPIC
Bilirubin Urine: NEGATIVE
Glucose, UA: NEGATIVE mg/dL
Hgb urine dipstick: NEGATIVE
Ketones, ur: NEGATIVE mg/dL
Nitrite: NEGATIVE
Protein, ur: NEGATIVE mg/dL
Specific Gravity, Urine: 1.012 (ref 1.005–1.030)
pH: 6 (ref 5.0–8.0)

## 2018-02-28 LAB — BRAIN NATRIURETIC PEPTIDE: B Natriuretic Peptide: 205.3 pg/mL — ABNORMAL HIGH (ref 0.0–100.0)

## 2018-02-28 NOTE — ED Notes (Signed)
Bed: UJ81 Expected date:  Expected time:  Means of arrival:  Comments: Ems weakness

## 2018-02-28 NOTE — ED Triage Notes (Signed)
Generalized weakness x 1 week. Pt from home. Pt complains of hackin gcough for months. Pt states he has a feeling like he's going to die, but cannot describe why. Hx of afib. EMS VS 120/78 60 RR 18 96% RA CBG 248

## 2018-02-28 NOTE — Discharge Instructions (Signed)
Follow-up with your primary care doctor to be rechecked.  Return as needed for worsening symptoms

## 2018-02-28 NOTE — ED Notes (Signed)
Patient aware urine sample is needed. Provided fluids. Patient will attempt to provide sample shortly.

## 2018-02-28 NOTE — ED Notes (Signed)
Pt is aware that urine is needed for sample, urinal at bedside. 

## 2018-02-28 NOTE — ED Provider Notes (Signed)
[facility] DEPT Provider Note   CSN: [id number] Arrival date & time: 02/28/18  1708     History   Chief Complaint Chief Complaint  Patient presents with  . Weakness    HPI [name] [name] is a 84 y.o. male.  HPI Patient presents to the emergency room for evaluation of generalized weakness.  Patient is somewhat of a poor historian.  He is very pleasant and answers my questions but has not really able to specifically tell me what is actually bothering him.  Patient states the symptoms have been going on for the week.  When asking him specific questions he does mention having a cough for several months.  He is denying any pain such as chest pain, abdominal pain or headache.  He denies any difficulty moving his arms or legs.  He is not mentioning any issues with his appetite.  He denies any trouble with vomiting or diarrhea. Past Medical History:  Diagnosis Date  . Arthritis   . Chronic atrial fibrillation ([place])   . Diabetes mellitus without complication ([place])   . Hyperlipidemia   . Hypertension   . Sleep apnea    no cpap used, sleep study was several yrs ago  . Urinary retention foley last changed 3 weeks ago    Patient Active Problem List   Diagnosis Date Noted  . Gastroenteritis 10/06/2017  . Hypokalemia 10/02/2017  . Dehydration 10/02/2017  . Cerebral embolism with cerebral infarction 01/04/2017  . Dementia without behavioral disturbance   . Falls 01/03/2017  . Generalized weakness 01/01/2017  . Frequent falls 01/01/2017  . Acute hypoxemic respiratory failure ([place]) 04/09/2016  . COPD (chronic obstructive pulmonary disease) with chronic bronchitis ([place]) 03/12/2016  . HCAP (healthcare-associated pneumonia) 02/26/2016  . Elevated troponin 02/26/2016  . Elevated lactic acid level 01/28/2016  . Sepsis ([place]) 01/28/2016  . Acute renal failure superimposed on stage 3 chronic kidney disease ([place]) 04/21/2015  . Hyperglycemia 04/21/2015  .  Bronchitis 04/21/2015  . Tachycardia 04/21/2015  . BPH (benign prostatic hyperplasia) 04/21/2015  . Dyspnea 12/28/2012  . Fall 12/28/2012  . OTHER DYSPNEA AND RESPIRATORY ABNORMALITIES 05/27/2010  . Hyperlipidemia 11/27/2008  . HYPERTENSION, BENIGN 11/27/2008  . Atrial fibrillation with RVR ([place]) 11/27/2008  . GERD 11/27/2008  . BPH/LUTS W/O OBSTRUCTION 11/27/2008  . ARTHRITIS 11/27/2008    Past Surgical History:  Procedure Laterality Date  . BACK SURGERY  yrs ago   lower back surgery  . PROSTATE SURGERY  yrs ago, no cancer  . TONSILLECTOMY  as child  . TRANSURETHRAL RESECTION OF PROSTATE  02/22/2012   Procedure: TRANSURETHRAL RESECTION OF THE PROSTATE WITH GYRUS INSTRUMENTS;  Surgeon: Molli Hazard, MD;  Location: [facility] ORS;  Service: Urology;  Laterality: N/A;           Home Medications    Prior to Admission medications   Medication Sig Start Date End Date Taking? Authorizing Provider  aspirin 325 MG tablet Take 1 tablet (325 mg total) by mouth daily. Patient taking differently: Take 325 mg by mouth daily as needed for headache (pain).  01/05/17  Yes Florencia Reasons, MD  atorvastatin (LIPITOR) 20 MG tablet Take 1 tablet (20 mg total) by mouth daily at 6 PM. 01/04/17  Yes Florencia Reasons, MD  donepezil (ARICEPT) 10 MG tablet Take 10 mg by mouth at bedtime.  01/09/16  Yes [provider]  finasteride (PROSCAR) 5 MG tablet Take 5 mg by mouth at bedtime.    Yes [provider]  Melatonin 10 MG TABS Take 10 mg by mouth at bedtime.   Yes [provider]  metoprolol succinate (TOPROL-XL) 50 MG 24 hr tablet Take 1 tablet (50 mg total) by mouth at bedtime. Patient is overdue for an appointment and needs to schedule for further refills 2nd attempt 10/01/17  Yes Sherren Mocha, MD  Multiple Vitamin (MULTIVITAMIN WITH MINERALS) TABS tablet Take 1 tablet by mouth daily.   Yes [provider]  potassium chloride SA (K-DUR,KLOR-CON) 20 MEQ tablet Take 1 tablet (20  mEq total) by mouth daily. 10/05/17  Yes Rosita Fire, MD    Family History Family History  Problem Relation Age of Onset  . Heart attack Father     Social History Social History   Tobacco Use  . Smoking status: Former Smoker    Packs/day: 0.50    Years: 50.00    Pack years: 25.00    Types: Cigarettes    Last attempt to quit: 12/21/1980    Years since quitting: 37.2  . Smokeless tobacco: Never Used  Substance Use Topics  . Alcohol use: Yes    Alcohol/week: 0.0 oz    Comment: rare use  . Drug use: No     Allergies   Sulfa antibiotics   Review of Systems Review of Systems  All other systems reviewed and are negative.    Physical Exam Updated Vital Signs BP 134/83   Pulse 82   Temp 98.3 F (36.8 C) (Oral)   Resp 19   Ht 1.702 m (5\' 7" )   Wt 81.6 kg (180 lb)   SpO2 96%   BMI 28.19 kg/m   Physical Exam  Constitutional: No distress.  HENT:  Head: Normocephalic and atraumatic.  Right Ear: External ear normal.  Left Ear: External ear normal.  Eyes: Conjunctivae are normal. Right eye exhibits no discharge. Left eye exhibits no discharge. No scleral icterus.  Neck: Neck supple. No tracheal deviation present.  Cardiovascular: Normal rate, regular rhythm and intact distal pulses.  Pulmonary/Chest: Effort normal and breath sounds normal. No stridor. No respiratory distress. He has no wheezes. He has no rales.  Abdominal: Soft. Bowel sounds are normal. He exhibits no distension. There is no tenderness. There is no rebound and no guarding.  Musculoskeletal: He exhibits no edema or tenderness.  Neurological: He is alert. He has normal strength. No cranial nerve deficit (no facial droop, extraocular movements intact, no slurred speech) or sensory deficit. He exhibits normal muscle tone. He displays no seizure activity. Coordination normal.  Skin: Skin is warm and dry. No rash noted. He is not diaphoretic.  Psychiatric: He has a normal mood and affect.  Nursing  note and vitals reviewed.    ED Treatments / Results  Labs (all labs ordered are listed, but only abnormal results are displayed) Labs Reviewed  COMPREHENSIVE METABOLIC PANEL - Abnormal; Notable for the following components:      Result Value   Glucose, Bld 173 (*)    BUN 21 (*)    Creatinine, Ser 1.66 (*)    ALT 14 (*)    GFR calc non Af Amer 35 (*)    GFR calc Af Amer 40 (*)    All other components within normal limits  BRAIN NATRIURETIC PEPTIDE - Abnormal; Notable for the following components:   B Natriuretic Peptide 205.3 (*)    All other components within normal limits  URINALYSIS, ROUTINE W REFLEX MICROSCOPIC - Abnormal; Notable for the following components:   Leukocytes, UA TRACE (*)  Bacteria, UA RARE (*)    Squamous Epithelial / LPF 0-5 (*)    All other components within normal limits  CBC WITH DIFFERENTIAL/PLATELET  I-STAT TROPONIN, ED    EKG  EKG Interpretation  Date/Time:  Monday February 28 2018 17:59:06 EDT Ventricular Rate:  65 PR Interval:    QRS Duration: 107 QT Interval:  447 QTC Calculation: 465 R Axis:   37 Text Interpretation:  Atrial fibrillation Low voltage, extremity and precordial leads nonspecific t wave changes resolved compared to  Oct 03 2017 Confirmed by Dorie Rank [phone]) on 02/28/2018 6:06:06 PM       Radiology Dg Chest 2 View  Result Date: 02/28/2018 CLINICAL DATA:  Generalized weakness for 1 week, chronic cough. Former smoker. EXAM: CHEST - 2 VIEW COMPARISON:  Chest radiograph October 06, 2017 FINDINGS: Cardiomediastinal silhouette is unremarkable for this low inspiratory examination with crowded vasculature markings. Calcified aortic arch. The lungs are clear without pleural effusions or focal consolidations. Chronic interstitial changes. Trachea projects midline and there is no pneumothorax. Included soft tissue planes and osseous structures are non-suspicious. IMPRESSION: Chronic interstitial changes without focal consolidation. Aortic  Atherosclerosis (ICD10-I70.0). Electronically Signed   By: Elon Alas M.D.   On: 02/28/2018 17:57    Procedures Procedures (including critical care time)  Medications Ordered in ED Medications - No data to display   Initial Impression / Assessment and Plan / ED Course  I have reviewed the triage vital signs and the nursing notes.  Pertinent labs & imaging results that were available during my care of the patient were reviewed by me and considered in my medical decision making (see chart for details).   Patient presented to the emergency room for vague complaints of generalized malaise.  He denied any focal weakness.  His neurologic exam is reassuring.  I doubt stroke or TIA.  Laboratory tests are reassuring.  No signs of anemia or significant dehydration.  I doubt cardiac ischemia or congestive heart failure.  Patient is feeling her at this point.  He is ready to go home and would like to be discharged.  I recommend follow-up with his primary care doctor.  Final Clinical Impressions(s) / ED Diagnoses   Final diagnoses:  Weakness    ED Discharge Orders    None       Dorie Rank, MD 02/28/18 2145

## 2018-03-15 ENCOUNTER — Encounter: Payer: Self-pay | Admitting: Sports Medicine

## 2018-03-15 ENCOUNTER — Ambulatory Visit (INDEPENDENT_AMBULATORY_CARE_PROVIDER_SITE_OTHER): Payer: Medicare Other | Admitting: Sports Medicine

## 2018-03-15 DIAGNOSIS — F0150 Vascular dementia without behavioral disturbance: Secondary | ICD-10-CM

## 2018-03-15 DIAGNOSIS — B351 Tinea unguium: Secondary | ICD-10-CM | POA: Diagnosis not present

## 2018-03-15 DIAGNOSIS — I739 Peripheral vascular disease, unspecified: Secondary | ICD-10-CM | POA: Diagnosis not present

## 2018-03-15 DIAGNOSIS — R296 Repeated falls: Secondary | ICD-10-CM

## 2018-03-15 NOTE — Progress Notes (Signed)
Subjective: [name] [name] is a 84 y.o. male patient who presents to office for nail care. Patient is assisted by wife who reports difficulty after stroke with self/nail care and still some tenderness to toes with use of blankets.   Patient Active Problem List   Diagnosis Date Noted  . Gastroenteritis 10/06/2017  . Hypokalemia 10/02/2017  . Dehydration 10/02/2017  . Cerebral embolism with cerebral infarction 01/04/2017  . Dementia without behavioral disturbance   . Falls 01/03/2017  . Generalized weakness 01/01/2017  . Frequent falls 01/01/2017  . Acute hypoxemic respiratory failure ([place]) 04/09/2016  . COPD (chronic obstructive pulmonary disease) with chronic bronchitis ([place]) 03/12/2016  . HCAP (healthcare-associated pneumonia) 02/26/2016  . Elevated troponin 02/26/2016  . Elevated lactic acid level 01/28/2016  . Sepsis ([place]) 01/28/2016  . Acute renal failure superimposed on stage 3 chronic kidney disease ([place]) 04/21/2015  . Hyperglycemia 04/21/2015  . Bronchitis 04/21/2015  . Tachycardia 04/21/2015  . BPH (benign prostatic hyperplasia) 04/21/2015  . Dyspnea 12/28/2012  . Fall 12/28/2012  . OTHER DYSPNEA AND RESPIRATORY ABNORMALITIES 05/27/2010  . Hyperlipidemia 11/27/2008  . HYPERTENSION, BENIGN 11/27/2008  . Atrial fibrillation with RVR ([place]) 11/27/2008  . GERD 11/27/2008  . BPH/LUTS W/O OBSTRUCTION 11/27/2008  . ARTHRITIS 11/27/2008    Current Outpatient Medications on File Prior to Visit  Medication Sig Dispense Refill  . aspirin 325 MG tablet Take 1 tablet (325 mg total) by mouth daily. (Patient taking differently: Take 325 mg by mouth daily as needed for headache (pain). ) 30 tablet 0  . atorvastatin (LIPITOR) 20 MG tablet Take 1 tablet (20 mg total) by mouth daily at 6 PM. 30 tablet 0  . donepezil (ARICEPT) 10 MG tablet Take 10 mg by mouth at bedtime.   6  . finasteride (PROSCAR) 5 MG tablet Take 5 mg by mouth at bedtime.     . Melatonin 10 MG TABS Take 10 mg by  mouth at bedtime.    . metoprolol succinate (TOPROL-XL) 50 MG 24 hr tablet Take 1 tablet (50 mg total) by mouth at bedtime. Patient is overdue for an appointment and needs to schedule for further refills 2nd attempt 15 tablet 0  . Multiple Vitamin (MULTIVITAMIN WITH MINERALS) TABS tablet Take 1 tablet by mouth daily.    . potassium chloride SA (K-DUR,KLOR-CON) 20 MEQ tablet Take 1 tablet (20 mEq total) by mouth daily. 20 tablet 0   No current facility-administered medications on file prior to visit.     Allergies  Allergen Reactions  . Sulfa Antibiotics Swelling    Swelling of hands    Objective:  General: Alert and oriented x3 in no acute distress  Dermatology:No wounds, all nails x 10 are thick, yellow, mycotic and mildly elongated.   Vascular: Dorsalis Pedis and Posterior Tibial pedal pulses not palpable, Capillary Fill Time >5seconds,(-) pedal hair growth bilateral, purple discoloration to both feet and legs. Temperature gradient severely decreased bilateral.   Neurology: Gross sensation intact via light touch bilateral.  Musculoskeletal: No tenderness with palpation bilateral. Strength 4/5 in all groups bilateral.   Assessment and Plan: Problem List Items Addressed This Visit      Nervous and Auditory   Dementia without behavioral disturbance     Other   Frequent falls    Other Visit Diagnoses    Dermatophytosis of nail    -  Primary   PVD (peripheral vascular disease) ([place])           -Complete examination performed -Re-Discussed importance of  routine foot care since vascular status is poor -Mechaincally debrided using sterile nail nipper without incident all nails -Recommend patient that we will consider sooner cardiology or vascular follow up if circulation worsens; will continue to monitor at this time -Recommend to refrain from heavy quilts/bedding on feet and bedtime or things that can cause toes to be sore  -Patient to return to office in 10 weeks for routine  foot care or sooner if condition worsens.  Landis Martins, DPM

## 2018-05-17 ENCOUNTER — Ambulatory Visit: Payer: Medicare Other | Admitting: Sports Medicine

## 2018-05-18 DIAGNOSIS — L570 Actinic keratosis: Secondary | ICD-10-CM | POA: Diagnosis not present

## 2018-05-18 DIAGNOSIS — R58 Hemorrhage, not elsewhere classified: Secondary | ICD-10-CM | POA: Diagnosis not present

## 2018-07-01 ENCOUNTER — Encounter: Payer: Self-pay | Admitting: Podiatry

## 2018-07-01 ENCOUNTER — Ambulatory Visit (INDEPENDENT_AMBULATORY_CARE_PROVIDER_SITE_OTHER): Payer: Medicare Other | Admitting: Podiatry

## 2018-07-01 DIAGNOSIS — M79674 Pain in right toe(s): Secondary | ICD-10-CM

## 2018-07-01 DIAGNOSIS — M79675 Pain in left toe(s): Secondary | ICD-10-CM | POA: Diagnosis not present

## 2018-07-01 DIAGNOSIS — I739 Peripheral vascular disease, unspecified: Secondary | ICD-10-CM | POA: Diagnosis not present

## 2018-07-01 DIAGNOSIS — B351 Tinea unguium: Secondary | ICD-10-CM

## 2018-07-03 NOTE — Progress Notes (Signed)
Subjective: [name] [name] presents today with cc of painful, discolored, thick toenails  Pain is aggravated when wearing enclosed shoe gear. He also has dementia. His wife states that heavy blankets cause pain to this toes. Pain is relieved with periodic professional debridement.  Objective: There were no vitals filed for this visit. Vascular Examination: Capillary refill time <3 seconds x 10 digits Dorsalis pedis pulses absent b/l Posterior tibial pulses absent b/l No digital hair x 10 digits Skin temperature warm to cool b/l  Dermatological Examination: Skin thin, shiny and atrophic b/l Toenails 1-5 b/l discolored, thick, dystrophic with subungual debris and pain with palpation to nailbeds due to thickness of nails.   Musculoskeletal: Muscle strength 5/5 to all LE muscle groups  Neurological: Sensation intact with 10 gram monofilament b/l   Assessment: 1. Painful onychomycosis toenails 1-5 b/l 2. Peripheral arterial disease  Plan: 1. Toenails 1-5 b/l were debrided in length and girth without iatrogenic bleeding. 2. Patient to continue soft, supportive shoe gear 3. Patient to report any pedal injuries to medical professional  4. Follow up 10 weeks. 5. Patient/POA to call should there be a concern in the interim.

## 2018-09-09 DIAGNOSIS — C61 Malignant neoplasm of prostate: Secondary | ICD-10-CM | POA: Diagnosis not present

## 2018-09-09 DIAGNOSIS — E7800 Pure hypercholesterolemia, unspecified: Secondary | ICD-10-CM | POA: Diagnosis not present

## 2018-09-09 DIAGNOSIS — E1129 Type 2 diabetes mellitus with other diabetic kidney complication: Secondary | ICD-10-CM | POA: Diagnosis not present

## 2018-09-09 DIAGNOSIS — I10 Essential (primary) hypertension: Secondary | ICD-10-CM | POA: Diagnosis not present

## 2018-10-03 DIAGNOSIS — R300 Dysuria: Secondary | ICD-10-CM | POA: Diagnosis not present

## 2018-10-03 DIAGNOSIS — R310 Gross hematuria: Secondary | ICD-10-CM | POA: Diagnosis not present

## 2018-10-05 DIAGNOSIS — N183 Chronic kidney disease, stage 3 (moderate): Secondary | ICD-10-CM | POA: Diagnosis not present

## 2018-10-05 DIAGNOSIS — N390 Urinary tract infection, site not specified: Secondary | ICD-10-CM | POA: Diagnosis not present

## 2018-10-05 DIAGNOSIS — R3129 Other microscopic hematuria: Secondary | ICD-10-CM | POA: Diagnosis not present

## 2018-10-05 DIAGNOSIS — E1129 Type 2 diabetes mellitus with other diabetic kidney complication: Secondary | ICD-10-CM | POA: Diagnosis not present

## 2018-10-05 DIAGNOSIS — I10 Essential (primary) hypertension: Secondary | ICD-10-CM | POA: Diagnosis not present

## 2018-10-05 DIAGNOSIS — R310 Gross hematuria: Secondary | ICD-10-CM | POA: Diagnosis not present

## 2018-10-07 ENCOUNTER — Ambulatory Visit: Payer: Medicare Other | Admitting: Podiatry

## 2018-10-19 ENCOUNTER — Encounter: Payer: Self-pay | Admitting: Podiatry

## 2018-10-19 ENCOUNTER — Ambulatory Visit (INDEPENDENT_AMBULATORY_CARE_PROVIDER_SITE_OTHER): Payer: Medicare Other | Admitting: Podiatry

## 2018-10-19 DIAGNOSIS — M79674 Pain in right toe(s): Secondary | ICD-10-CM

## 2018-10-19 DIAGNOSIS — B351 Tinea unguium: Secondary | ICD-10-CM | POA: Diagnosis not present

## 2018-10-19 DIAGNOSIS — M79675 Pain in left toe(s): Secondary | ICD-10-CM

## 2018-11-06 NOTE — Progress Notes (Signed)
Subjective: EMMANUEL GRUENHAGEN presents accompanied by caregiver, Zada Finders on today> He is seen for follow up  with  cc of painful, discolored, thick toenails.  Pain is aggravated when wearing enclosed shoe gear. Pain is getting progressively worse and relieved with periodic professional debridement.  Mr. Nawabi nor caregiver, Zada Finders, voice any new concerns on today's visit.  Objective: Vascular Examination: Capillary refill time <3 seconds x 10 digits Dorsalis pedis and posterior tibial pulses absent b/l No digital hair x 10 digits Skin temperature warm to cool b/l  Dermatological Examination: Skin thin and atrophic b/l Toenails 1-5 b/l discolored, thick, dystrophic with subungual debris and pain with palpation to nailbeds due to thickness of nails.  Musculoskeletal: Muscle strength 5/5 to all LE muscle groups  Neurological: Sensation intact with 10 gram monofilament.   Assessment: Painful onychomycosis toenails 1-5 b/l   Plan: 1. Toenails 1-5 b/l were debrided in length and girth without iatrogenic bleeding. 2. Patient to continue soft, supportive shoe gear 3. Patient to report any pedal injuries to medical professional immediately. 4. Follow up 10 weeks. Patient/POA to call should there be a concern in the interim.

## 2018-12-22 DIAGNOSIS — I130 Hypertensive heart and chronic kidney disease with heart failure and stage 1 through stage 4 chronic kidney disease, or unspecified chronic kidney disease: Secondary | ICD-10-CM | POA: Diagnosis not present

## 2018-12-22 DIAGNOSIS — Z0000 Encounter for general adult medical examination without abnormal findings: Secondary | ICD-10-CM | POA: Diagnosis not present

## 2018-12-22 DIAGNOSIS — I509 Heart failure, unspecified: Secondary | ICD-10-CM | POA: Diagnosis not present

## 2018-12-22 DIAGNOSIS — N183 Chronic kidney disease, stage 3 (moderate): Secondary | ICD-10-CM | POA: Diagnosis not present

## 2018-12-22 DIAGNOSIS — E1129 Type 2 diabetes mellitus with other diabetic kidney complication: Secondary | ICD-10-CM | POA: Diagnosis not present

## 2018-12-28 ENCOUNTER — Ambulatory Visit (INDEPENDENT_AMBULATORY_CARE_PROVIDER_SITE_OTHER): Payer: Medicare Other | Admitting: Podiatry

## 2018-12-28 DIAGNOSIS — M79675 Pain in left toe(s): Secondary | ICD-10-CM | POA: Diagnosis not present

## 2018-12-28 DIAGNOSIS — M79674 Pain in right toe(s): Secondary | ICD-10-CM | POA: Diagnosis not present

## 2018-12-28 DIAGNOSIS — B351 Tinea unguium: Secondary | ICD-10-CM

## 2018-12-28 NOTE — Patient Instructions (Signed)

## 2019-01-09 DIAGNOSIS — N3942 Incontinence without sensory awareness: Secondary | ICD-10-CM | POA: Diagnosis not present

## 2019-01-09 DIAGNOSIS — C61 Malignant neoplasm of prostate: Secondary | ICD-10-CM | POA: Diagnosis not present

## 2019-01-19 ENCOUNTER — Encounter: Payer: Self-pay | Admitting: Podiatry

## 2019-01-19 NOTE — Progress Notes (Signed)
Subjective: [name] [name] presents today with painful, thick toenails 1-5 b/l that he cannot cut and which interfere with daily activities.  Pain is aggravated when wearing enclosed shoe gear.  Mr. [name] [name] nor his caretaker voice any new concerns on today's visit.  [place], [name] Him, MD is his PCP.   Current Outpatient Medications:  .  amoxicillin-clavulanate (AUGMENTIN) 875-125 MG tablet, TAKE 1 TABLET BY MOUTH TWICE A DAY FOR 7 DAYS, Disp: , Rfl: 0 .  aspirin 325 MG tablet, Take 1 tablet (325 mg total) by mouth daily. (Patient taking differently: Take 325 mg by mouth daily as needed for headache (pain). ), Disp: 30 tablet, Rfl: 0 .  atorvastatin (LIPITOR) 20 MG tablet, Take 1 tablet (20 mg total) by mouth daily at 6 PM., Disp: 30 tablet, Rfl: 0 .  donepezil (ARICEPT) 10 MG tablet, Take 10 mg by mouth at bedtime. , Disp: , Rfl: 6 .  finasteride (PROSCAR) 5 MG tablet, Take 5 mg by mouth at bedtime. , Disp: , Rfl:  .  Melatonin 10 MG TABS, Take 10 mg by mouth at bedtime., Disp: , Rfl:  .  metoprolol succinate (TOPROL-XL) 50 MG 24 hr tablet, Take 1 tablet (50 mg total) by mouth at bedtime. Patient is overdue for an appointment and needs to schedule for further refills 2nd attempt, Disp: 15 tablet, Rfl: 0 .  Multiple Vitamin (MULTIVITAMIN WITH MINERALS) TABS tablet, Take 1 tablet by mouth daily., Disp: , Rfl:  .  potassium chloride SA (K-DUR,KLOR-CON) 20 MEQ tablet, Take 1 tablet (20 mEq total) by mouth daily., Disp: 20 tablet, Rfl: 0  Allergies  Allergen Reactions  . Sulfa Antibiotics Swelling    Swelling of hands    Objective:  Vascular Examination: Capillary refill time <3 seconds x 10 digits  Dorsalis pedis and Posterior tibial pulses absent b/l  Digital hair present x 10 digits  Skin temperature gradient warm to cool b/l  Dermatological Examination: Skin thin and atrophic b/l  Toenails 1-5 b/l discolored, thick, dystrophic with subungual debris and pain with palpation to  nailbeds due to thickness of nails.  Musculoskeletal: LE muscle atrophy b/l  Pt wheelchair bound    Neurological: Sensation intact with 10 gram monofilament.  Assessment: Painful onychomycosis toenails 1-5 b/l   Plan: 1. Toenails 1-5 b/l were debrided in length and girth without iatrogenic bleeding. 2. Patient to continue soft, supportive shoe gear 3. Patient to report any pedal injuries to medical professional immediately. 4. Follow up 3 months.  5. Patient/POA to call should there be a concern in the interim.

## 2019-02-25 ENCOUNTER — Encounter (HOSPITAL_COMMUNITY): Payer: Self-pay

## 2019-02-25 ENCOUNTER — Observation Stay (HOSPITAL_BASED_OUTPATIENT_CLINIC_OR_DEPARTMENT_OTHER): Payer: Medicare Other

## 2019-02-25 ENCOUNTER — Other Ambulatory Visit: Payer: Self-pay

## 2019-02-25 ENCOUNTER — Observation Stay (HOSPITAL_COMMUNITY): Payer: Medicare Other

## 2019-02-25 ENCOUNTER — Observation Stay (HOSPITAL_COMMUNITY)
Admission: EM | Admit: 2019-02-25 | Discharge: 2019-02-26 | Disposition: A | Discharge status: Home / Self Care | Payer: Medicare Other | Attending: Family Medicine | Admitting: Family Medicine

## 2019-02-25 ENCOUNTER — Emergency Department (HOSPITAL_COMMUNITY): Payer: Medicare Other

## 2019-02-25 DIAGNOSIS — F0150 Vascular dementia without behavioral disturbance: Secondary | ICD-10-CM

## 2019-02-25 DIAGNOSIS — R55 Syncope and collapse: Secondary | ICD-10-CM | POA: Diagnosis not present

## 2019-02-25 DIAGNOSIS — I10 Essential (primary) hypertension: Secondary | ICD-10-CM | POA: Insufficient documentation

## 2019-02-25 DIAGNOSIS — I63421 Cerebral infarction due to embolism of right anterior cerebral artery: Secondary | ICD-10-CM

## 2019-02-25 DIAGNOSIS — M1990 Unspecified osteoarthritis, unspecified site: Secondary | ICD-10-CM | POA: Diagnosis not present

## 2019-02-25 DIAGNOSIS — N401 Enlarged prostate with lower urinary tract symptoms: Secondary | ICD-10-CM | POA: Insufficient documentation

## 2019-02-25 DIAGNOSIS — Z87891 Personal history of nicotine dependence: Secondary | ICD-10-CM | POA: Diagnosis not present

## 2019-02-25 DIAGNOSIS — I5032 Chronic diastolic (congestive) heart failure: Secondary | ICD-10-CM

## 2019-02-25 DIAGNOSIS — E785 Hyperlipidemia, unspecified: Secondary | ICD-10-CM | POA: Diagnosis not present

## 2019-02-25 DIAGNOSIS — I700 Atherosclerosis of aorta: Secondary | ICD-10-CM | POA: Diagnosis not present

## 2019-02-25 DIAGNOSIS — Z7982 Long term (current) use of aspirin: Secondary | ICD-10-CM | POA: Diagnosis not present

## 2019-02-25 DIAGNOSIS — R531 Weakness: Secondary | ICD-10-CM | POA: Diagnosis not present

## 2019-02-25 DIAGNOSIS — Z79899 Other long term (current) drug therapy: Secondary | ICD-10-CM | POA: Diagnosis not present

## 2019-02-25 DIAGNOSIS — I482 Chronic atrial fibrillation, unspecified: Secondary | ICD-10-CM | POA: Diagnosis present

## 2019-02-25 DIAGNOSIS — E1122 Type 2 diabetes mellitus with diabetic chronic kidney disease: Secondary | ICD-10-CM | POA: Insufficient documentation

## 2019-02-25 DIAGNOSIS — K219 Gastro-esophageal reflux disease without esophagitis: Secondary | ICD-10-CM | POA: Insufficient documentation

## 2019-02-25 DIAGNOSIS — M419 Scoliosis, unspecified: Secondary | ICD-10-CM | POA: Insufficient documentation

## 2019-02-25 DIAGNOSIS — N183 Chronic kidney disease, stage 3 unspecified: Secondary | ICD-10-CM

## 2019-02-25 DIAGNOSIS — R05 Cough: Secondary | ICD-10-CM | POA: Diagnosis not present

## 2019-02-25 DIAGNOSIS — F0390 Unspecified dementia without behavioral disturbance: Secondary | ICD-10-CM | POA: Diagnosis present

## 2019-02-25 DIAGNOSIS — G4730 Sleep apnea, unspecified: Secondary | ICD-10-CM | POA: Diagnosis not present

## 2019-02-25 DIAGNOSIS — Z8249 Family history of ischemic heart disease and other diseases of the circulatory system: Secondary | ICD-10-CM | POA: Insufficient documentation

## 2019-02-25 DIAGNOSIS — R0902 Hypoxemia: Secondary | ICD-10-CM | POA: Diagnosis not present

## 2019-02-25 DIAGNOSIS — R270 Ataxia, unspecified: Secondary | ICD-10-CM | POA: Diagnosis not present

## 2019-02-25 DIAGNOSIS — R410 Disorientation, unspecified: Secondary | ICD-10-CM | POA: Diagnosis not present

## 2019-02-25 DIAGNOSIS — N400 Enlarged prostate without lower urinary tract symptoms: Secondary | ICD-10-CM | POA: Diagnosis present

## 2019-02-25 DIAGNOSIS — I4821 Permanent atrial fibrillation: Secondary | ICD-10-CM | POA: Diagnosis not present

## 2019-02-25 DIAGNOSIS — I4891 Unspecified atrial fibrillation: Secondary | ICD-10-CM | POA: Diagnosis not present

## 2019-02-25 DIAGNOSIS — I6340 Cerebral infarction due to embolism of unspecified cerebral artery: Secondary | ICD-10-CM | POA: Diagnosis present

## 2019-02-25 DIAGNOSIS — J449 Chronic obstructive pulmonary disease, unspecified: Secondary | ICD-10-CM | POA: Diagnosis not present

## 2019-02-25 DIAGNOSIS — R338 Other retention of urine: Secondary | ICD-10-CM | POA: Diagnosis not present

## 2019-02-25 HISTORY — DX: Chronic kidney disease, stage 3 unspecified: N18.30

## 2019-02-25 HISTORY — DX: Chronic diastolic (congestive) heart failure: I50.32

## 2019-02-25 LAB — COMPREHENSIVE METABOLIC PANEL
ALT: 17 U/L (ref 0–44)
AST: 20 U/L (ref 15–41)
Albumin: 3.8 g/dL (ref 3.5–5.0)
Alkaline Phosphatase: 87 U/L (ref 38–126)
Anion gap: 8 (ref 5–15)
BILIRUBIN TOTAL: 0.7 mg/dL (ref 0.3–1.2)
BUN: 27 mg/dL — AB (ref 8–23)
CALCIUM: 9.1 mg/dL (ref 8.9–10.3)
CO2: 26 mmol/L (ref 22–32)
Chloride: 102 mmol/L (ref 98–111)
Creatinine, Ser: 1.71 mg/dL — ABNORMAL HIGH (ref 0.61–1.24)
GFR calc Af Amer: 40 mL/min — ABNORMAL LOW (ref >60)
GFR, EST NON AFRICAN AMERICAN: 34 mL/min — AB (ref >60)
Glucose, Bld: 151 mg/dL — ABNORMAL HIGH (ref 70–99)
Potassium: 3.9 mmol/L (ref 3.5–5.1)
Sodium: 136 mmol/L (ref 135–145)
TOTAL PROTEIN: 7.1 g/dL (ref 6.5–8.1)

## 2019-02-25 LAB — CBC WITH DIFFERENTIAL/PLATELET
Abs Immature Granulocytes: 0.04 10*3/uL (ref 0.00–0.07)
Basophils Absolute: 0.1 10*3/uL (ref 0.0–0.1)
Basophils Relative: 1 %
EOS ABS: 0.3 10*3/uL (ref 0.0–0.5)
EOS PCT: 3 %
HEMATOCRIT: 42.6 % (ref 39.0–52.0)
Hemoglobin: 13.9 g/dL (ref 13.0–17.0)
IMMATURE GRANULOCYTES: 0 %
LYMPHS ABS: 1.3 10*3/uL (ref 0.7–4.0)
Lymphocytes Relative: 13 %
MCH: 31.7 pg (ref 26.0–34.0)
MCHC: 32.6 g/dL (ref 30.0–36.0)
MCV: 97.3 fL (ref 80.0–100.0)
MONO ABS: 0.7 10*3/uL (ref 0.1–1.0)
MONOS PCT: 7 %
NEUTROS PCT: 76 %
Neutro Abs: 7.9 10*3/uL — ABNORMAL HIGH (ref 1.7–7.7)
Platelets: 198 10*3/uL (ref 150–400)
RBC: 4.38 MIL/uL (ref 4.22–5.81)
RDW: 13 % (ref 11.5–15.5)
WBC: 10.3 10*3/uL (ref 4.0–10.5)
nRBC: 0 % (ref 0.0–0.2)

## 2019-02-25 LAB — BRAIN NATRIURETIC PEPTIDE: B Natriuretic Peptide: 153.5 pg/mL — ABNORMAL HIGH (ref 0.0–100.0)

## 2019-02-25 LAB — ECHOCARDIOGRAM COMPLETE
HEIGHTINCHES: 67 in
Weight: 3111.13 oz

## 2019-02-25 LAB — URINALYSIS, ROUTINE W REFLEX MICROSCOPIC
Bilirubin Urine: NEGATIVE
GLUCOSE, UA: NEGATIVE mg/dL
HGB URINE DIPSTICK: NEGATIVE
KETONES UR: NEGATIVE mg/dL
LEUKOCYTE UA: NEGATIVE
Nitrite: NEGATIVE
PH: 6 (ref 5.0–8.0)
Protein, ur: NEGATIVE mg/dL
Specific Gravity, Urine: 1.01 (ref 1.005–1.030)

## 2019-02-25 MED ORDER — FINASTERIDE 5 MG PO TABS
5.0000 mg | ORAL_TABLET | Freq: Every day | ORAL | Status: DC
  Administered 2019-02-25: 5 mg via ORAL
  Filled 2019-02-25: qty 1

## 2019-02-25 MED ORDER — ADULT MULTIVITAMIN W/MINERALS CH
1.0000 | ORAL_TABLET | Freq: Every day | ORAL | Status: DC
  Administered 2019-02-25 – 2019-02-26 (×2): 1 via ORAL
  Filled 2019-02-25 (×2): qty 1

## 2019-02-25 MED ORDER — ENOXAPARIN SODIUM 40 MG/0.4ML ~~LOC~~ SOLN
40.0000 mg | SUBCUTANEOUS | Status: DC
  Administered 2019-02-25 – 2019-02-26 (×2): 40 mg via SUBCUTANEOUS
  Filled 2019-02-25 (×2): qty 0.4

## 2019-02-25 MED ORDER — ALBUTEROL SULFATE (2.5 MG/3ML) 0.083% IN NEBU: 2.5000 mg | INHALATION_SOLUTION | RESPIRATORY_TRACT | Status: DC | PRN

## 2019-02-25 MED ORDER — SENNOSIDES-DOCUSATE SODIUM 8.6-50 MG PO TABS: 1.0000 | ORAL_TABLET | Freq: Every evening | ORAL | Status: DC | PRN

## 2019-02-25 MED ORDER — DONEPEZIL HCL 5 MG PO TABS
10.0000 mg | ORAL_TABLET | Freq: Every day | ORAL | Status: DC
  Administered 2019-02-25: 10 mg via ORAL
  Filled 2019-02-25: qty 2

## 2019-02-25 MED ORDER — ASPIRIN 325 MG PO TABS
325.0000 mg | ORAL_TABLET | Freq: Every day | ORAL | Status: DC
  Administered 2019-02-25 – 2019-02-26 (×2): 325 mg via ORAL
  Filled 2019-02-25 (×2): qty 1

## 2019-02-25 MED ORDER — ACETAMINOPHEN 650 MG RE SUPP: 650.0000 mg | Freq: Four times a day (QID) | RECTAL | Status: DC | PRN

## 2019-02-25 MED ORDER — SODIUM CHLORIDE 0.9% FLUSH
3.0000 mL | Freq: Two times a day (BID) | INTRAVENOUS | Status: DC
  Administered 2019-02-25 – 2019-02-26 (×3): 3 mL via INTRAVENOUS

## 2019-02-25 MED ORDER — METOPROLOL SUCCINATE ER 50 MG PO TB24
50.0000 mg | ORAL_TABLET | Freq: Every day | ORAL | Status: DC
  Administered 2019-02-25: 50 mg via ORAL
  Filled 2019-02-25: qty 1

## 2019-02-25 MED ORDER — ONDANSETRON HCL 4 MG PO TABS: 4.0000 mg | ORAL_TABLET | Freq: Four times a day (QID) | ORAL | Status: DC | PRN

## 2019-02-25 MED ORDER — ACETAMINOPHEN 325 MG PO TABS: 650.0000 mg | ORAL_TABLET | Freq: Four times a day (QID) | ORAL | Status: DC | PRN

## 2019-02-25 MED ORDER — ONDANSETRON HCL 4 MG/2ML IJ SOLN: 4.0000 mg | Freq: Four times a day (QID) | INTRAMUSCULAR | Status: DC | PRN

## 2019-02-25 MED ORDER — ATORVASTATIN CALCIUM 20 MG PO TABS
20.0000 mg | ORAL_TABLET | Freq: Every day | ORAL | Status: DC
  Administered 2019-02-25: 20 mg via ORAL
  Filled 2019-02-25: qty 1

## 2019-02-25 MED ORDER — MELATONIN 5 MG PO TABS
10.0000 mg | ORAL_TABLET | Freq: Every day | ORAL | Status: DC
  Administered 2019-02-25: 10 mg via ORAL
  Filled 2019-02-25: qty 2

## 2019-02-25 NOTE — Progress Notes (Addendum)
PROGRESS NOTE  Brief Narrative: "[name] [name]" Emert is a 85 y.o. male with a history of chronic AFib not on anticoagulation, T2DM, stage III CKD, CVA, chronic HFpEF, COPD, and HTN who presented after a fall at home. It appears he was simply weak when attempting to go to the washroom. He denies passing out currently and there was no reported LOC by is son with whom he lives or EMS, though he did at one point report this to the admitting physician. He continues to feel weak generally without focal deficits.   Subjective: Feels well, no pain. Confirms history as above, denies LOC. Has a chronic very wet rattly cough that is not worse. He denies choking on food/drink. Daughter at bedside confirms.  Objective: BP 132/82 (BP Location: Right Arm)   Pulse 65   Temp 97.9 F (36.6 C) (Oral)   Resp 18   Ht 5\' 7"  (1.702 m)   Wt 88.2 kg   SpO2 95%   BMI 30.45 kg/m   Gen: Elderly male, pleasant, HOH Pulm: Clear and nonlabored, though significant upper airway secretions  CV: Irreg irreg, no murmur, no JVD, no edema GI: Soft, NT, ND, +BS  Neuro: Alert, diffusely weak but no focal deficits. Skin: No rashes, lesions or ulcers  Assessment & Plan: Continue observation (please see H&P from 6:54am today for full details).  - Echo pending - Continue telemetry as he has many risk factors for perfusion-limiting arrhythmias. - MRI thus far appears negative and pt remains nonfocal.  - PT/OT evaluation, though pt has significant care at home - Anticipate early DC 3/8  Patrecia Pour, MD Pager [phone] 02/25/2019, 7:07 PM

## 2019-02-25 NOTE — ED Notes (Signed)
MD at bedside. 

## 2019-02-25 NOTE — Progress Notes (Signed)
  Echocardiogram 2D Echocardiogram has been attempted. Patient left. Will reattempt when patient returns.  [name] [name] 02/25/2019, 9:38 AM

## 2019-02-25 NOTE — Progress Notes (Signed)
  Echocardiogram 2D Echocardiogram has been performed.  [name] [name] G Isbella Arline 02/25/2019, 1:58 PM

## 2019-02-25 NOTE — ED Notes (Signed)
Pt unable to tolerate ambulating without unsteady gait and weakness.  MD aware.

## 2019-02-25 NOTE — ED Provider Notes (Signed)
[facility] DEPT Provider Note   CSN: [id number] Arrival date & time: 02/25/19  0158    History   Chief Complaint Chief Complaint  Patient presents with  . Weakness    HPI [name] [name] is a 85 y.o. male.     The history is provided by the patient.  Weakness  Severity:  Moderate Onset quality:  Gradual Timing:  Constant Progression:  Worsening Chronicity:  New Relieved by:  Nothing Worsened by:  Activity Associated symptoms: cough   Associated symptoms: no chest pain, no fever, no shortness of breath and no vomiting   PT With history of chronic A. fib, diabetes, hypertension presents from home for generalized weakness.  He was attempting to get out of bed to go to the restroom, when he felt generalized weakness. He slid out of the chair to the floor but did not injure himself.  No LOC, no head injury.  He is normally able to walk without assistance, but he is now having difficulty walking. Past Medical History:  Diagnosis Date  . Arthritis   . Chronic atrial fibrillation   . Diabetes mellitus without complication ([place])   . Hyperlipidemia   . Hypertension   . Sleep apnea    no cpap used, sleep study was several yrs ago  . Urinary retention foley last changed 3 weeks ago    Patient Active Problem List   Diagnosis Date Noted  . Gastroenteritis 10/06/2017  . Hypokalemia 10/02/2017  . Dehydration 10/02/2017  . Cerebral embolism with cerebral infarction 01/04/2017  . Dementia without behavioral disturbance ([place])   . Falls 01/03/2017  . Generalized weakness 01/01/2017  . Frequent falls 01/01/2017  . Acute hypoxemic respiratory failure ([place]) 04/09/2016  . COPD (chronic obstructive pulmonary disease) with chronic bronchitis ([place]) 03/12/2016  . HCAP (healthcare-associated pneumonia) 02/26/2016  . Elevated troponin 02/26/2016  . Elevated lactic acid level 01/28/2016  . Sepsis ([place]) 01/28/2016  . Acute renal failure superimposed on  stage 3 chronic kidney disease ([place]) 04/21/2015  . Hyperglycemia 04/21/2015  . Bronchitis 04/21/2015  . Tachycardia 04/21/2015  . BPH (benign prostatic hyperplasia) 04/21/2015  . Dyspnea 12/28/2012  . Fall 12/28/2012  . OTHER DYSPNEA AND RESPIRATORY ABNORMALITIES 05/27/2010  . Hyperlipidemia 11/27/2008  . HYPERTENSION, BENIGN 11/27/2008  . Atrial fibrillation with RVR ([place]) 11/27/2008  . GERD 11/27/2008  . BPH/LUTS W/O OBSTRUCTION 11/27/2008  . ARTHRITIS 11/27/2008    Past Surgical History:  Procedure Laterality Date  . BACK SURGERY  yrs ago   lower back surgery  . PROSTATE SURGERY  yrs ago, no cancer  . TONSILLECTOMY  as child  . TRANSURETHRAL RESECTION OF PROSTATE  02/22/2012   Procedure: TRANSURETHRAL RESECTION OF THE PROSTATE WITH GYRUS INSTRUMENTS;  Surgeon: Molli Hazard, MD;  Location: [facility]S;  Service: Urology;  Laterality: N/A;            Home Medications    Prior to Admission medications   Medication Sig Start Date End Date Taking? Authorizing Provider  aspirin 325 MG tablet Take 1 tablet (325 mg total) by mouth daily. Patient taking differently: Take 325 mg by mouth daily as needed for headache (pain).  01/05/17   Florencia Reasons, MD  atorvastatin (LIPITOR) 20 MG tablet Take 1 tablet (20 mg total) by mouth daily at 6 PM. 01/04/17   Florencia Reasons, MD  donepezil (ARICEPT) 10 MG tablet Take 10 mg by mouth at bedtime.  01/09/16   [provider]  finasteride (PROSCAR) 5 MG tablet  Take 5 mg by mouth at bedtime.     [provider]  Melatonin 10 MG TABS Take 10 mg by mouth at bedtime.    [provider]  metoprolol succinate (TOPROL-XL) 50 MG 24 hr tablet Take 1 tablet (50 mg total) by mouth at bedtime. Patient is overdue for an appointment and needs to schedule for further refills 2nd attempt 10/01/17   Sherren Mocha, MD  Multiple Vitamin (MULTIVITAMIN WITH MINERALS) TABS tablet Take 1 tablet by mouth daily.    [provider]  potassium  chloride SA (K-DUR,KLOR-CON) 20 MEQ tablet Take 1 tablet (20 mEq total) by mouth daily. 10/05/17   Rosita Fire, MD    Family History Family History  Problem Relation Age of Onset  . Heart attack Father     Social History Social History   Tobacco Use  . Smoking status: Former Smoker    Packs/day: 0.50    Years: 50.00    Pack years: 25.00    Types: Cigarettes    Last attempt to quit: 12/21/1980    Years since quitting: 38.2  . Smokeless tobacco: Never Used  Substance Use Topics  . Alcohol use: Yes    Alcohol/week: 0.0 standard drinks    Comment: rare use  . Drug use: No     Allergies   Sulfa antibiotics   Review of Systems Review of Systems  Constitutional: Positive for fatigue. Negative for fever.  Respiratory: Positive for cough. Negative for shortness of breath.   Cardiovascular: Negative for chest pain.  Gastrointestinal: Negative for vomiting.  Neurological: Positive for weakness. Negative for syncope.  All other systems reviewed and are negative.    Physical Exam Updated Vital Signs BP 139/86   Pulse 79   Temp 97.9 F (36.6 C) (Oral)   Resp 17   Ht 1.715 m (5' 7.5")   Wt 81.6 kg   SpO2 99%   BMI 27.76 kg/m   Physical Exam CONSTITUTIONAL: Elderly and frail, no acute distress HEAD: Normocephalic/atraumatic EYES: EOMI/PERRL ENMT: Mucous membranes moist NECK: supple no meningeal signs SPINE/BACK:entire spine nontender CV: Irregular, no loud murmurs LUNGS: Coarse breath sounds bilaterally ABDOMEN: soft, nontender, no rebound or guarding, bowel sounds noted throughout abdomen GU:no cva tenderness NEURO: Pt is awake/alert/appropriate, moves all extremitiesx4.  No facial droop.  No arm or leg drift EXTREMITIES: pulses normal/equal, full ROM, no visible signs of trauma SKIN: warm, color normal PSYCH: no abnormalities of mood noted, alert and oriented to situation   ED Treatments / Results  Labs (all labs ordered are listed, but only  abnormal results are displayed) Labs Reviewed  COMPREHENSIVE METABOLIC PANEL - Abnormal; Notable for the following components:      Result Value   Glucose, Bld 151 (*)    BUN 27 (*)    Creatinine, Ser 1.71 (*)    GFR calc non Af Amer 34 (*)    GFR calc Af Amer 40 (*)    All other components within normal limits  CBC WITH DIFFERENTIAL/PLATELET - Abnormal; Notable for the following components:   Neutro Abs 7.9 (*)    All other components within normal limits  URINALYSIS, ROUTINE W REFLEX MICROSCOPIC - Abnormal; Notable for the following components:   Color, Urine STRAW (*)    All other components within normal limits    EKG EKG Interpretation  Date/Time:  Saturday February 25 2019 02:01:07 EST Ventricular Rate:  85 PR Interval:    QRS Duration: 87 QT Interval:  406 QTC Calculation:  37 R Axis:   54 Text Interpretation:  Atrial fibrillation Anteroseptal infarct, age indeterminate Interpretation limited secondary to artifact No significant change since last tracing Confirmed by Ripley Fraise [phone]) on 02/25/2019 2:53:39 AM   Radiology Dg Chest 2 View  Result Date: 02/25/2019 CLINICAL DATA:  Cough. Weakness. EXAM: CHEST - 2 VIEW COMPARISON:  Radiograph 02/28/2018, CT 02/27/2016 FINDINGS: Patient is chronically rotated. Unchanged heart size and mediastinal contours. There is aortic atherosclerosis. No acute airspace disease. Mild chronic interstitial changes are stable. No pleural effusion or pneumothorax. Scoliosis and degenerative change in the spine. IMPRESSION: No acute findings. Aortic Atherosclerosis (ICD10-I70.0). Electronically Signed   By: Keith Rake M.D.   On: 02/25/2019 02:51   Ct Head Wo Contrast  Result Date: 02/25/2019 CLINICAL DATA:  Confusion and ataxia EXAM: CT HEAD WITHOUT CONTRAST TECHNIQUE: Contiguous axial images were obtained from the base of the skull through the vertex without intravenous contrast. COMPARISON:  10/02/2017 FINDINGS: Brain: Chronic atrophic  and white matter ischemic changes are identified. No findings to suggest acute hemorrhage, acute infarction or space-occupying mass lesion are noted. Vascular: No hyperdense vessel or unexpected calcification. Skull: Normal. Negative for fracture or focal lesion. Sinuses/Orbits: No acute finding. Other: None. IMPRESSION: Chronic atrophic and ischemic changes similar to that noted on the prior exam. No acute abnormality noted. Electronically Signed   By: Inez Catalina M.D.   On: 02/25/2019 05:16    Procedures Procedures    Medications Ordered in ED Medications - No data to display   Initial Impression / Assessment and Plan / ED Course  I have reviewed the triage vital signs and the nursing notes.  Pertinent labs & imaging results that were available during my care of the patient were reviewed by me and considered in my medical decision making (see chart for details).        5:59 AM Patient presents for generalized weakness of unclear etiology.  He reports he is usually able to ambulate.  He is unable to ambulate here and appears very unsteady. Extensive work-up including CT imaging has been unrevealing On assessment, there is no focal weakness. Patient will be admitted to the hospital, discussed with Dr. Blaine Hamper for admission Final Clinical Impressions(s) / ED Diagnoses   Final diagnoses:  Weakness  Permanent atrial fibrillation    ED Discharge Orders    None       Ripley Fraise, MD 02/25/19 [phone]

## 2019-02-25 NOTE — ED Notes (Signed)
Bed: JF35 Expected date:  Expected time:  Means of arrival:  Comments: EMS 85 yo male generalized weakness/dementia-hx CVA-atrial fib

## 2019-02-25 NOTE — ED Notes (Signed)
hospitalist at bedside at this time 

## 2019-02-25 NOTE — ED Notes (Signed)
ED TO INPATIENT HANDOFF REPORT  ED Nurse Name and Phone #: Kennyth Lose [phone]  S Name/Age/Gender Archie Endo 85 y.o. male Room/Bed: WA16/WA16  Code Status   Code Status: Full Code  Home/SNF/Other Home Patient oriented to: self, place, time and situation Is this baseline? Yes   Triage Complete: Triage complete  Chief Complaint Weakness  Triage Note Pt came to the ED from home with complaints of generalized weakness.  Pt normally able to walk without any assistance, however, has been unable to stand or walk without assist x2.  Pt has hx of CHF.  Pt slid out of recliner chair to the floor when attempting to void.  No LOC or report of hitting head.   Allergies Allergies  Allergen Reactions  . Sulfa Antibiotics Swelling    Swelling of hands    Level of Care/Admitting Diagnosis ED Disposition    ED Disposition Condition Comment   Admit  Hospital Area: [facility] [[id number]]  Level of Care: Telemetry [5]  Admit to tele based on following criteria: Other see comments  Comments: a fib  Diagnosis: Syncope [761607]  Admitting Physician: Ivor Costa [[id number]]  Attending Physician: Ivor Costa [[id number]]  PT Class (Do Not Modify): Observation [104]  PT Acc Code (Do Not Modify): Observation [10022]       B Medical/Surgery History Past Medical History:  Diagnosis Date  . Arthritis   . Chronic atrial fibrillation   . Diabetes mellitus without complication ([place])   . Hyperlipidemia   . Hypertension   . Sleep apnea    no cpap used, sleep study was several yrs ago  . Urinary retention foley last changed 3 weeks ago   Past Surgical History:  Procedure Laterality Date  . BACK SURGERY  yrs ago   lower back surgery  . PROSTATE SURGERY  yrs ago, no cancer  . TONSILLECTOMY  as child  . TRANSURETHRAL RESECTION OF PROSTATE  02/22/2012   Procedure: TRANSURETHRAL RESECTION OF THE PROSTATE WITH GYRUS INSTRUMENTS;  Surgeon: Molli Hazard, MD;  Location: [facility];   Service: Urology;  Laterality: N/A;         A IV Location/Drains/Wounds Patient Lines/Drains/Airways Status   Active Line/Drains/Airways    Name:   Placement date:   Placement time:   Site:   Days:   Peripheral IV 02/25/19 Left Wrist   02/25/19    0159    Wrist   less than 1   External Urinary Catheter   10/03/17    0248    -   510          Intake/Output Last 24 hours No intake or output data in the 24 hours ending 02/25/19 0641  Labs/Imaging Results for orders placed or performed during the hospital encounter of 02/25/19 (from the past 48 hour(s))  Comprehensive metabolic panel     Status: Abnormal   Collection Time: 02/25/19  2:29 AM  Result Value Ref Range   Sodium 136 135 - 145 mmol/L   Potassium 3.9 3.5 - 5.1 mmol/L   Chloride 102 98 - 111 mmol/L   CO2 26 22 - 32 mmol/L   Glucose, Bld 151 (H) 70 - 99 mg/dL   BUN 27 (H) 8 - 23 mg/dL   Creatinine, Ser 1.71 (H) 0.61 - 1.24 mg/dL   Calcium 9.1 8.9 - 10.3 mg/dL   Total Protein 7.1 6.5 - 8.1 g/dL   Albumin 3.8 3.5 - 5.0 g/dL   AST 20 15 - 41 U/L  ALT 17 0 - 44 U/L   Alkaline Phosphatase 87 38 - 126 U/L   Total Bilirubin 0.7 0.3 - 1.2 mg/dL   GFR calc non Af Amer 34 (L) >60 mL/min   GFR calc Af Amer 40 (L) >60 mL/min   Anion gap 8 5 - 15    Comment: Performed at [facility], [place] [address] [place], [place] [address]  CBC with Differential/Platelet     Status: Abnormal   Collection Time: 02/25/19  2:29 AM  Result Value Ref Range   WBC 10.3 4.0 - 10.5 K/uL   RBC 4.38 4.22 - 5.81 MIL/uL   Hemoglobin 13.9 13.0 - 17.0 g/dL   HCT 42.6 39.0 - 52.0 %   MCV 97.3 80.0 - 100.0 fL   MCH 31.7 26.0 - 34.0 pg   MCHC 32.6 30.0 - 36.0 g/dL   RDW 13.0 11.5 - 15.5 %   Platelets 198 150 - 400 K/uL   nRBC 0.0 0.0 - 0.2 %   Neutrophils Relative % 76 %   Neutro Abs 7.9 (H) 1.7 - 7.7 K/uL   Lymphocytes Relative 13 %   Lymphs Abs 1.3 0.7 - 4.0 K/uL   Monocytes Relative 7 %   Monocytes Absolute 0.7 0.1 - 1.0  K/uL   Eosinophils Relative 3 %   Eosinophils Absolute 0.3 0.0 - 0.5 K/uL   Basophils Relative 1 %   Basophils Absolute 0.1 0.0 - 0.1 K/uL   Immature Granulocytes 0 %   Abs Immature Granulocytes 0.04 0.00 - 0.07 K/uL    Comment: Performed at [facility], [place] [address] [place], [place] [address]  Urinalysis, Routine w reflex microscopic     Status: Abnormal   Collection Time: 02/25/19  2:29 AM  Result Value Ref Range   Color, Urine STRAW (A) YELLOW   APPearance CLEAR CLEAR   Specific Gravity, Urine 1.010 1.005 - 1.030   pH 6.0 5.0 - 8.0   Glucose, UA NEGATIVE NEGATIVE mg/dL   Hgb urine dipstick NEGATIVE NEGATIVE   Bilirubin Urine NEGATIVE NEGATIVE   Ketones, ur NEGATIVE NEGATIVE mg/dL   Protein, ur NEGATIVE NEGATIVE mg/dL   Nitrite NEGATIVE NEGATIVE   Leukocytes,Ua NEGATIVE NEGATIVE    Comment: Performed at [facility] [address] [place], [place] [address]   Dg Chest 2 View  Result Date: 02/25/2019 CLINICAL DATA:  Cough. Weakness. EXAM: CHEST - 2 VIEW COMPARISON:  Radiograph 02/28/2018, CT 02/27/2016 FINDINGS: Patient is chronically rotated. Unchanged heart size and mediastinal contours. There is aortic atherosclerosis. No acute airspace disease. Mild chronic interstitial changes are stable. No pleural effusion or pneumothorax. Scoliosis and degenerative change in the spine. IMPRESSION: No acute findings. Aortic Atherosclerosis (ICD10-I70.0). Electronically Signed   By: Keith Rake M.D.   On: 02/25/2019 02:51   Ct Head Wo Contrast  Result Date: 02/25/2019 CLINICAL DATA:  Confusion and ataxia EXAM: CT HEAD WITHOUT CONTRAST TECHNIQUE: Contiguous axial images were obtained from the base of the skull through the vertex without intravenous contrast. COMPARISON:  10/02/2017 FINDINGS: Brain: Chronic atrophic and white matter ischemic changes are identified. No findings to suggest acute hemorrhage, acute infarction or space-occupying mass lesion  are noted. Vascular: No hyperdense vessel or unexpected calcification. Skull: Normal. Negative for fracture or focal lesion. Sinuses/Orbits: No acute finding. Other: None. IMPRESSION: Chronic atrophic and ischemic changes similar to that noted on the prior exam. No acute abnormality noted. Electronically Signed   By: Linus Mako.D.  On: 02/25/2019 05:16    Pending Labs Unresulted Labs (From admission, onward)    Start     Ordered   02/26/19 2423  Basic metabolic panel  Tomorrow morning,   R     02/25/19 0556   02/26/19 0500  CBC  Tomorrow morning,   R     02/25/19 0556   02/25/19 0600  Brain natriuretic peptide  Once,   R     02/25/19 0559          Vitals/Pain Today's Vitals   02/25/19 0613 02/25/19 0615 02/25/19 0620 02/25/19 0627  BP:   120/65   Pulse: (!) 56 78 (!) 59 66  Resp: 14 (!) 21 18 15   Temp:      TempSrc:      SpO2: 96% 96% 95% 95%  Weight:      Height:      PainSc:        Isolation Precautions No active isolations  Medications Medications  sodium chloride flush (NS) 0.9 % injection 3 mL (has no administration in time range)  enoxaparin (LOVENOX) injection 40 mg (has no administration in time range)  acetaminophen (TYLENOL) tablet 650 mg (has no administration in time range)    Or  acetaminophen (TYLENOL) suppository 650 mg (has no administration in time range)  senna-docusate (Senokot-S) tablet 1 tablet (has no administration in time range)  ondansetron (ZOFRAN) tablet 4 mg (has no administration in time range)    Or  ondansetron (ZOFRAN) injection 4 mg (has no administration in time range)  albuterol (PROVENTIL) (2.5 MG/3ML) 0.083% nebulizer solution 2.5 mg (has no administration in time range)    Mobility walks with person assist Low fall risk   Focused Assessments Cardiac Assessment Handoff:  Cardiac Rhythm: Atrial fibrillation Lab Results  Component Value Date   TROPONINI <0.03 10/03/2017   No results found for: DDIMER Does the Patient  currently have chest pain? No  , Neuro Assessment Handoff:  Swallow screen pass? Yes  Cardiac Rhythm: Atrial fibrillation NIH Stroke Scale ( + Modified Stroke Scale Criteria)  LOC Questions (1b. )   +: Answers both questions correctly LOC Commands (1c. )   + : Performs both tasks correctly Best Gaze (2. )  +: Normal Visual (3. )  +: No visual loss Motor Arm, Left (5a. )   +: No drift Motor Arm, Right (5b. )   +: No drift Motor Leg, Left (6a. )   +: No drift Motor Leg, Right (6b. )   +: Drift Sensory (8. )   +: Normal, no sensory loss Best Language (9. )   +: No aphasia Extinction/Inattention (11.)   +: No Abnormality Modified SS Total  +: 1     Neuro Assessment: Within Defined Limits Neuro Checks:      Last Documented NIHSS Modified Score: 1 (02/25/19 0546) Has TPA been given? No If patient is a Neuro Trauma and patient is going to OR before floor call report to [place] nurse: [phone] or [phone]     R Recommendations: See Admitting Provider Note  Report given to:   Additional Notes: A fib

## 2019-02-25 NOTE — ED Triage Notes (Signed)
Pt came to the ED from home with complaints of generalized weakness.  Pt normally able to walk without any assistance, however, has been unable to stand or walk without assist x2.  Pt has hx of CHF.  Pt slid out of recliner chair to the floor when attempting to void.  No LOC or report of hitting head.

## 2019-02-25 NOTE — Evaluation (Signed)
Physical Therapy Evaluation Patient Details Name: [name] [name] MRN: [id number] DOB: 1926-04-27 Today's Date: 02/25/2019   History of Present Illness  85 yo with fall from recliner ( in the middle of the night he forgot to call for help )  and family mentioned increased difficulty with ambulation lately.   Clinical Impression  Pt very pleasant to work with tries hard. Pt with difficulty standing and ambulation this session with some posterior lean, feel this should improve with more times pt is out of bed. Pt does require assist with these things at home as well and has 24/7 assist at home. Will continue to follow while here to increase his ability and safety, however also recommend HHPT to f/u once DC to assist with increasing home safety and pt strength and ability for mobility.      Follow Up Recommendations Home health PT(family feels caregivers 24/7 do a good job of assisting pt when needed. )    Equipment Recommendations  None recommended by PT    Recommendations for Other Services       Precautions / Restrictions Precautions Precautions: Fall      Mobility  Bed Mobility Overal bed mobility: Needs Assistance Bed Mobility: Supine to Sit;Sit to Supine     Supine to sit: Mod assist     General bed mobility comments: assist with LE and scooting hips to EOB using pad he was sitting on   Transfers Overall transfer level: Needs assistance Equipment used: Rolling walker (2 wheeled) Transfers: Sit to/from Stand Sit to Stand: +2 physical assistance;Mod assist         General transfer comment: initally had posterior lean but improved with some weight shifting and stepping, however was always present , never balance indepedently this session  Ambulation/Gait Ambulation/Gait assistance: Mod assist;+2 physical assistance;+2 safety/equipment Gait Distance (Feet): 5 Feet Assistive device: Rolling walker (2 wheeled)       General Gait Details: 5 steps with pivot from bed  to recliner. small steps , posterior lean still and pt had 3 attempts with sit to stand , wanting to sit back down each time. needs encouraging for safety and confdence   Stairs            Wheelchair Mobility    Modified Rankin (Stroke Patients Only)       Balance Overall balance assessment: Needs assistance Sitting-balance support: Bilateral upper extremity supported;Feet supported Sitting balance-Leahy Scale: Fair     Standing balance support: Bilateral upper extremity supported;During functional activity Standing balance-Leahy Scale: Poor                               Pertinent Vitals/Pain Pain Assessment: No/denies pain    Home Living Family/patient expects to be discharged to:: Private residence Living Arrangements: Children(son lives with him ) Available Help at Discharge: Available 24 hours/day(son and caregivers offer 24/7 hands on care) Type of Home: House Home Access: Ramped entrance     Home Layout: One level Home Equipment: Environmental consultant - 2 wheels;Wheelchair - manual;Bedside commode Additional Comments: family uses the [facility] to get him from the house to the car. Otherwise caregivers walk with pt in the house from place to place with RW. He does need assist and caregivers able to give that per pt's dtr.     Prior Function Level of Independence: Needs assistance   Gait / Transfers Assistance Needed: with RW and assit from caregivers  Hand Dominance        Extremity/Trunk Assessment        Lower Extremity Assessment Lower Extremity Assessment: Generalized weakness       Communication   Communication: HOH  Cognition Arousal/Alertness: Awake/alert Behavior During Therapy: WFL for tasks assessed/performed Overall Cognitive Status: History of cognitive impairments - at baseline                                 General Comments: very pleasant and follow all commands, humorous at times as well.       General  Comments      Exercises     Assessment/Plan    PT Assessment Patient needs continued PT services  PT Problem List Decreased strength;Decreased activity tolerance;Decreased mobility       PT Treatment Interventions Gait training;Functional mobility training;Therapeutic activities;Therapeutic exercise;Patient/family education    PT Goals (Current goals can be found in the Care Plan section)  Acute Rehab PT Goals PT Goal Formulation: Patient unable to participate in goal setting Time For Goal Achievement: 03/11/19 Potential to Achieve Goals: Fair    Frequency Min 3X/week   Barriers to discharge        Co-evaluation               AM-PAC PT "6 Clicks" Mobility  Outcome Measure Help needed turning from your back to your side while in a flat bed without using bedrails?: A Lot Help needed moving from lying on your back to sitting on the side of a flat bed without using bedrails?: A Lot Help needed moving to and from a bed to a chair (including a wheelchair)?: A Lot Help needed standing up from a chair using your arms (e.g., wheelchair or bedside chair)?: A Lot Help needed to walk in hospital room?: A Lot Help needed climbing 3-5 steps with a railing? : Total 6 Click Score: 11    End of Session Equipment Utilized During Treatment: Gait belt Activity Tolerance: Patient tolerated treatment well Patient left: in chair;with chair alarm set;with family/visitor present;with call bell/phone within reach Nurse Communication: Mobility status(NT in room for bed mobility and transfer ) PT Visit Diagnosis: Unsteadiness on feet (R26.81);Muscle weakness (generalized) (M62.81)    Time: 2353-6144 PT Time Calculation (min) (ACUTE ONLY): 36 min   Charges:   PT Evaluation $PT Eval Moderate Complexity: 1 Mod PT Treatments $Therapeutic Activity: 8-22 mins        Clide Dales, PT Acute Rehabilitation Services Pager: [phone] Office: [phone] 02/25/2019   Clide Dales 02/25/2019, 6:40 PM

## 2019-02-25 NOTE — H&P (Signed)
History and Physical    [name] [name] OAC:[id number] DOB: 03-13-1926 DOA: 02/25/2019  Referring MD/NP/PA:   PCP: Haywood Pao, MD   Patient coming from:  The patient is coming from home.  At baseline, pt is partially dependent for most of ADL.        Chief Complaint: syncope  HPI: [name] [name] is a 85 y.o. male with medical history significant of A fib not on AC, hypertension, hyperlipidemia, diet-controlled diabetes, COPD, stroke, dementia, BPH, dCHF, CKD stage III, who presents with syncope.  Initially patient reported to ED physician that he has generalized weakness and is unable to walk without assistance.  The detailed questioning revealed that he had syncope.  Patient states that he passed out at about 1:00 for a few minutes. No injury. No HA or neck pain. He denies unilateral weakness, numbness or tingling his extremities.  No facial droop.  He seems to have mild slurred speech.  Patient denies chest pain, shortness of breath, palpitation, cough.  No fever or chills.  Denies nausea vomiting, diarrhea, abdominal pain, symptoms of UTI.  ED Course: pt was found to have WBC 10.3, negative urinalysis, renal function close to baseline, temperature normal, heart rate of 120--> 69, oxygen saturation 94% on room air, CT head is negative for acute intracranial abnormalities.  Patient is placed on telemetry bed for observation.  Review of Systems:   General: no fevers, chills, no body weight gain, has fatigue and generalized weakness HEENT: no blurry vision, hearing changes or sore throat Respiratory: no dyspnea, coughing, wheezing CV: no chest pain, no palpitations GI: no nausea, vomiting, abdominal pain, diarrhea, constipation GU: no dysuria, burning on urination, increased urinary frequency, hematuria  Ext: Has mild leg edema Neuro: no unilateral weakness, numbness, or tingling, no vision change or hearing loss.  Has syncope and mild slurred speech Skin: no rash, no skin  tear. MSK: No muscle spasm, no deformity, no limitation of range of movement in spin Heme: No easy bruising.  Travel history: No recent long distant travel.  Allergy:  Allergies  Allergen Reactions  . Sulfa Antibiotics Swelling    Swelling of hands    Past Medical History:  Diagnosis Date  . Arthritis   . Chronic atrial fibrillation   . Diabetes mellitus without complication ([place])   . Hyperlipidemia   . Hypertension   . Sleep apnea    no cpap used, sleep study was several yrs ago  . Urinary retention foley last changed 3 weeks ago    Past Surgical History:  Procedure Laterality Date  . BACK SURGERY  yrs ago   lower back surgery  . PROSTATE SURGERY  yrs ago, no cancer  . TONSILLECTOMY  as child  . TRANSURETHRAL RESECTION OF PROSTATE  02/22/2012   Procedure: TRANSURETHRAL RESECTION OF THE PROSTATE WITH GYRUS INSTRUMENTS;  Surgeon: Molli Hazard, MD;  Location: [facility];  Service: Urology;  Laterality: N/A;        Social History:  reports that he quit smoking about 38 years ago. His smoking use included cigarettes. He has a 25.00 pack-year smoking history. He has never used smokeless tobacco. He reports current alcohol use. He reports that he does not use drugs.  Family History:  Family History  Problem Relation Age of Onset  . Heart attack Father      Prior to Admission medications   Medication Sig Start Date End Date Taking? Authorizing Provider  aspirin 325 MG tablet Take 1 tablet (325 mg total)  by mouth daily. Patient taking differently: Take 325 mg by mouth daily as needed for headache (pain).  01/05/17   Florencia Reasons, MD  atorvastatin (LIPITOR) 20 MG tablet Take 1 tablet (20 mg total) by mouth daily at 6 PM. 01/04/17   Florencia Reasons, MD  donepezil (ARICEPT) 10 MG tablet Take 10 mg by mouth at bedtime.  01/09/16   [provider]  finasteride (PROSCAR) 5 MG tablet Take 5 mg by mouth at bedtime.     [provider]  Melatonin 10 MG TABS Take 10 mg by  mouth at bedtime.    [provider]  metoprolol succinate (TOPROL-XL) 50 MG 24 hr tablet Take 1 tablet (50 mg total) by mouth at bedtime. Patient is overdue for an appointment and needs to schedule for further refills 2nd attempt 10/01/17   Sherren Mocha, MD  Multiple Vitamin (MULTIVITAMIN WITH MINERALS) TABS tablet Take 1 tablet by mouth daily.    [provider]  potassium chloride SA (K-DUR,KLOR-CON) 20 MEQ tablet Take 1 tablet (20 mEq total) by mouth daily. 10/05/17   Rosita Fire, MD    Physical Exam: Vitals:   02/25/19 5053 02/25/19 0538 02/25/19 0540 02/25/19 0553  BP: 134/88  (!) 129/98   Pulse: 69 75 74 84  Resp: 20 12 15  (!) 22  Temp:      TempSrc:      SpO2: 96% 97% 94% 97%  Weight:      Height:       General: Not in acute distress HEENT:       Eyes: PERRL, EOMI, no scleral icterus.       ENT: No discharge from the ears and nose, no pharynx injection, no tonsillar enlargement.        Neck: No JVD, no bruit, no mass felt. Heme: No neck lymph node enlargement. Cardiac: S1/S2, RRR, No murmurs, No gallops or rubs. Respiratory:  No rales, wheezing, rhonchi or rubs. GI: Soft, nondistended, nontender, no rebound pain, no organomegaly, BS present. GU: No hematuria Ext: Has venous insufficiency change in the lower legs, has trace leg edema bilaterally. 2+DP/PT pulse bilaterally. Musculoskeletal: No joint deformities, No joint redness or warmth, no limitation of ROM in spin. Skin: No rashes.  Neuro: Alert, oriented X3, cranial nerves II-XII grossly intact, moves all extremities normally. Muscle strength 5/5 in all extremities, sensation to light touch intact. Brachial reflex 2+ bilaterally. Negative Babinski's sign. Psych: Patient is not psychotic, no suicidal or hemocidal ideation.  Labs on Admission: I have personally reviewed following labs and imaging studies  CBC: Recent Labs  Lab 02/25/19 0229  WBC 10.3  NEUTROABS 7.9*  HGB 13.9  HCT  42.6  MCV 97.3  PLT 976   Basic Metabolic Panel: Recent Labs  Lab 02/25/19 0229  NA 136  K 3.9  CL 102  CO2 26  GLUCOSE 151*  BUN 27*  CREATININE 1.71*  CALCIUM 9.1   GFR: Estimated Creatinine Clearance: 29.1 mL/min (A) (by C-G formula based on SCr of 1.71 mg/dL (H)). Liver Function Tests: Recent Labs  Lab 02/25/19 0229  AST 20  ALT 17  ALKPHOS 87  BILITOT 0.7  PROT 7.1  ALBUMIN 3.8   No results for input(s): LIPASE, AMYLASE in the last 168 hours. No results for input(s): AMMONIA in the last 168 hours. Coagulation Profile: No results for input(s): INR, PROTIME in the last 168 hours. Cardiac Enzymes: No results for input(s): CKTOTAL, CKMB, CKMBINDEX, TROPONINI in the last 168 hours. BNP (last  3 results) No results for input(s): PROBNP in the last 8760 hours. HbA1C: No results for input(s): HGBA1C in the last 72 hours. CBG: No results for input(s): GLUCAP in the last 168 hours. Lipid Profile: No results for input(s): CHOL, HDL, LDLCALC, TRIG, CHOLHDL, LDLDIRECT in the last 72 hours. Thyroid Function Tests: No results for input(s): TSH, T4TOTAL, FREET4, T3FREE, THYROIDAB in the last 72 hours. Anemia Panel: No results for input(s): VITAMINB12, FOLATE, FERRITIN, TIBC, IRON, RETICCTPCT in the last 72 hours. Urine analysis:    Component Value Date/Time   COLORURINE STRAW (A) 02/25/2019 0229   APPEARANCEUR CLEAR 02/25/2019 0229   LABSPEC 1.010 02/25/2019 0229   PHURINE 6.0 02/25/2019 0229   GLUCOSEU NEGATIVE 02/25/2019 0229   HGBUR NEGATIVE 02/25/2019 0229   BILIRUBINUR NEGATIVE 02/25/2019 0229   KETONESUR NEGATIVE 02/25/2019 0229   PROTEINUR NEGATIVE 02/25/2019 0229   UROBILINOGEN 1.0 01/21/2012 2347   NITRITE NEGATIVE 02/25/2019 0229   LEUKOCYTESUR NEGATIVE 02/25/2019 0229   Sepsis Labs: @LABRCNTIP (procalcitonin:4,lacticidven:4) )No results found for this or any previous visit (from the past 240 hour(s)).   Radiological Exams on Admission: Dg Chest 2  View  Result Date: 02/25/2019 CLINICAL DATA:  Cough. Weakness. EXAM: CHEST - 2 VIEW COMPARISON:  Radiograph 02/28/2018, CT 02/27/2016 FINDINGS: Patient is chronically rotated. Unchanged heart size and mediastinal contours. There is aortic atherosclerosis. No acute airspace disease. Mild chronic interstitial changes are stable. No pleural effusion or pneumothorax. Scoliosis and degenerative change in the spine. IMPRESSION: No acute findings. Aortic Atherosclerosis (ICD10-I70.0). Electronically Signed   By: Keith Rake M.D.   On: 02/25/2019 02:51   Ct Head Wo Contrast  Result Date: 02/25/2019 CLINICAL DATA:  Confusion and ataxia EXAM: CT HEAD WITHOUT CONTRAST TECHNIQUE: Contiguous axial images were obtained from the base of the skull through the vertex without intravenous contrast. COMPARISON:  10/02/2017 FINDINGS: Brain: Chronic atrophic and white matter ischemic changes are identified. No findings to suggest acute hemorrhage, acute infarction or space-occupying mass lesion are noted. Vascular: No hyperdense vessel or unexpected calcification. Skull: Normal. Negative for fracture or focal lesion. Sinuses/Orbits: No acute finding. Other: None. IMPRESSION: Chronic atrophic and ischemic changes similar to that noted on the prior exam. No acute abnormality noted. Electronically Signed   By: Inez Catalina M.D.   On: 02/25/2019 05:16     EKG: Independently reviewed.  Atrial fibrillation, QTc 483, anteroseptal infarction pattern, low voltage,  Assessment/Plan Principal Problem:   Syncope Active Problems:   Hyperlipidemia   Atrial fibrillation, chronic   BPH (benign prostatic hyperplasia)   COPD (chronic obstructive pulmonary disease) with chronic bronchitis (HCC)   Cerebral embolism with cerebral infarction   Dementia without behavioral disturbance (HCC)   CKD (chronic kidney disease), stage III (HCC)   Chronic diastolic CHF (congestive heart failure) ([place])   Syncope: Patient reported that he  passed out for a few minutes.  He also has generalized weakness.  Cannot walk without assistance. The differential diagnosis is broad, including vasovagal syncope, TIA/stroke, arrhythmia, orthostatic status. No chest pain. Pt passed swallowing screen.  He states that he does not have history of joint replacement.  No pacemaker or any metal in any part of his body.  - Place on tele bed for obs - Orthostatic vital signs  - MRI-brain - 2d echo - Neuro checks  - PT/OT eval and treat  Hyperlipidemia: -lipitor  Atrial Fibrillation: CHA2DS2-VASc Score is 7, needs oral anticoagulation, but pt is not on AC at home, not sure why he is not on  AC. Heart rate is 120-->69. -continue metoprolol  BPH: stable - Continue  Proscar  COPD (chronic obstructive pulmonary disease) with chronic bronchitis (HCC): stable -prn albuterol nebs  Hx of Cerebral embolism with cerebral infarction: -on lipitor and ASA  Dementia without behavioral disturbance ([place]): -Donepezil  CKD (chronic kidney disease), stage III ([place]): Renal function close to baseline.  Baseline creatinine 1.4-1.6.  His creatinine is 1.71, BUN 27 -Follow-up renal function by BMP  Chronic diastolic CHF (congestive heart failure) [facility]): Patient has trace leg edema, but no shortness of breath.  CHF seems to be compensated.  Patient is not taking diuretics at home - Check BNP. -Continue metoprolol and ASA   DVT ppx:  SQ Lovenox Code Status: Full code Family Communication: None at bed side.     Disposition Plan:  To be determined Consults called:  none Admission status: Obs / tele     Date of Service 02/25/2019    Ivor Costa [facility]   If 7PM-7AM, please contact night-coverage [email or web address] Password [email or web address] 02/25/2019, 6:05 AM

## 2019-02-25 NOTE — ED Notes (Signed)
To XR at this time

## 2019-02-26 DIAGNOSIS — K219 Gastro-esophageal reflux disease without esophagitis: Secondary | ICD-10-CM | POA: Diagnosis not present

## 2019-02-26 DIAGNOSIS — J449 Chronic obstructive pulmonary disease, unspecified: Secondary | ICD-10-CM | POA: Diagnosis not present

## 2019-02-26 DIAGNOSIS — M419 Scoliosis, unspecified: Secondary | ICD-10-CM | POA: Diagnosis not present

## 2019-02-26 DIAGNOSIS — R338 Other retention of urine: Secondary | ICD-10-CM | POA: Diagnosis not present

## 2019-02-26 DIAGNOSIS — Z7982 Long term (current) use of aspirin: Secondary | ICD-10-CM | POA: Diagnosis not present

## 2019-02-26 DIAGNOSIS — E785 Hyperlipidemia, unspecified: Secondary | ICD-10-CM | POA: Diagnosis not present

## 2019-02-26 DIAGNOSIS — F0390 Unspecified dementia without behavioral disturbance: Secondary | ICD-10-CM | POA: Diagnosis not present

## 2019-02-26 DIAGNOSIS — Z87891 Personal history of nicotine dependence: Secondary | ICD-10-CM | POA: Diagnosis not present

## 2019-02-26 DIAGNOSIS — I5032 Chronic diastolic (congestive) heart failure: Secondary | ICD-10-CM

## 2019-02-26 DIAGNOSIS — N400 Enlarged prostate without lower urinary tract symptoms: Secondary | ICD-10-CM | POA: Diagnosis not present

## 2019-02-26 DIAGNOSIS — Z8249 Family history of ischemic heart disease and other diseases of the circulatory system: Secondary | ICD-10-CM | POA: Diagnosis not present

## 2019-02-26 DIAGNOSIS — R531 Weakness: Secondary | ICD-10-CM | POA: Diagnosis not present

## 2019-02-26 DIAGNOSIS — I482 Chronic atrial fibrillation, unspecified: Secondary | ICD-10-CM | POA: Diagnosis not present

## 2019-02-26 DIAGNOSIS — I10 Essential (primary) hypertension: Secondary | ICD-10-CM | POA: Diagnosis not present

## 2019-02-26 DIAGNOSIS — Z79899 Other long term (current) drug therapy: Secondary | ICD-10-CM | POA: Diagnosis not present

## 2019-02-26 DIAGNOSIS — N183 Chronic kidney disease, stage 3 (moderate): Secondary | ICD-10-CM | POA: Diagnosis not present

## 2019-02-26 DIAGNOSIS — R55 Syncope and collapse: Secondary | ICD-10-CM | POA: Diagnosis not present

## 2019-02-26 DIAGNOSIS — I63421 Cerebral infarction due to embolism of right anterior cerebral artery: Secondary | ICD-10-CM | POA: Diagnosis not present

## 2019-02-26 DIAGNOSIS — I4821 Permanent atrial fibrillation: Secondary | ICD-10-CM | POA: Diagnosis not present

## 2019-02-26 DIAGNOSIS — G4730 Sleep apnea, unspecified: Secondary | ICD-10-CM | POA: Diagnosis not present

## 2019-02-26 DIAGNOSIS — M1990 Unspecified osteoarthritis, unspecified site: Secondary | ICD-10-CM | POA: Diagnosis not present

## 2019-02-26 LAB — BASIC METABOLIC PANEL
Anion gap: 8 (ref 5–15)
BUN: 29 mg/dL — ABNORMAL HIGH (ref 8–23)
CALCIUM: 8.9 mg/dL (ref 8.9–10.3)
CO2: 24 mmol/L (ref 22–32)
Chloride: 106 mmol/L (ref 98–111)
Creatinine, Ser: 1.65 mg/dL — ABNORMAL HIGH (ref 0.61–1.24)
GFR calc Af Amer: 41 mL/min — ABNORMAL LOW (ref >60)
GFR calc non Af Amer: 36 mL/min — ABNORMAL LOW (ref >60)
Glucose, Bld: 114 mg/dL — ABNORMAL HIGH (ref 70–99)
Potassium: 4.1 mmol/L (ref 3.5–5.1)
Sodium: 138 mmol/L (ref 135–145)

## 2019-02-26 LAB — CBC
HCT: 40 % (ref 39.0–52.0)
Hemoglobin: 12.6 g/dL — ABNORMAL LOW (ref 13.0–17.0)
MCH: 31.6 pg (ref 26.0–34.0)
MCHC: 31.5 g/dL (ref 30.0–36.0)
MCV: 100.3 fL — ABNORMAL HIGH (ref 80.0–100.0)
Platelets: 179 10*3/uL (ref 150–400)
RBC: 3.99 MIL/uL — AB (ref 4.22–5.81)
RDW: 13.2 % (ref 11.5–15.5)
WBC: 8.2 10*3/uL (ref 4.0–10.5)
nRBC: 0 % (ref 0.0–0.2)

## 2019-02-26 NOTE — Discharge Summary (Signed)
Physician Discharge Summary  [name] [name] ZOX:[id number] DOB: 1926-05-09 DOA: 02/25/2019  PCP: Haywood Pao, MD  Admit date: 02/25/2019 Discharge date: 02/26/2019  Admitted From: Home Disposition: Home   Recommendations for Outpatient Follow-up:  1. Follow up with PCP in 1-2 weeks  Home Health: Pt, OT, continue 24 hr caretakers Equipment/Devices: RW, 3 in 1 Discharge Condition: Stable CODE STATUS: Full code Diet recommendation: Heart healthy  Brief/Interim Summary: "[name] Abrahams" [name] is a 85 y.o. male with a history of chronic AFib not on anticoagulation, T2DM, stage III CKD, CVA, chronic HFpEF, COPD, and HTN who presented after a fall at home. It appears he was simply weak when attempting to go to the washroom. He denies passing out currently and there was no reported LOC by is son with whom he lives or EMS, though he did at one point report this to the admitting physician. He continues to feel weak generally without focal deficits. MRI showed no acute findings and there were no perfusion-limiting arrhythmias during observation. Echocardiogram revealed no culprit valvular lesions or other contributing findings. He is stable for discharge.  Discharge Diagnoses:  Principal Problem:   Syncope Active Problems:   Hyperlipidemia   Atrial fibrillation, chronic   BPH (benign prostatic hyperplasia)   COPD (chronic obstructive pulmonary disease) with chronic bronchitis (HCC)   Cerebral embolism with cerebral infarction   Dementia without behavioral disturbance (HCC)   CKD (chronic kidney disease), stage III (HCC)   Chronic diastolic CHF (congestive heart failure) (HCC)  Syncope: No orthostasis, and otherwise negative work up as below. Patient is not felt to be reliable in history, though no reversible cause of syncope was found.   Weakness, generalized: Negative acute MRI - PT, OT recommend home therapy in addition to DME as provided at discharge and ongoing 24hr caretaker support as  already in place.   Discharge Instructions Discharge Instructions    Diet - low sodium heart healthy   Complete by:  As directed    Discharge instructions   Complete by:  As directed    You were evaluated after a fall at home. Fortunately there has been no evidence of serious cardiac rhythm disturbances, no major abnormalities on the echocardiogram, and no evidence of stroke on the CT or MRI of the head. No bone fractures were noted either. You are stable for discharge and will have medical equipment provided and home health PT and OT arranged. If your symptoms return, seek medical attention right away, otherwise follow up with your PCP in the next 1-2 weeks.   Increase activity slowly   Complete by:  As directed      Allergies as of 02/26/2019      Reactions   Sulfa Antibiotics Swelling   Swelling of hands      Medication List    STOP taking these medications   amoxicillin-clavulanate 875-125 MG tablet Commonly known as:  AUGMENTIN   potassium chloride SA 20 MEQ tablet Commonly known as:  K-DUR,KLOR-CON     TAKE these medications   aspirin 325 MG tablet Take 1 tablet (325 mg total) by mouth daily.   atorvastatin 20 MG tablet Commonly known as:  LIPITOR Take 1 tablet (20 mg total) by mouth daily at 6 PM.   CALCIUM PO Take 0.5 tablets by mouth daily.   donepezil 10 MG tablet Commonly known as:  ARICEPT Take 10 mg by mouth at bedtime.   finasteride 5 MG tablet Commonly known as:  PROSCAR Take 5 mg by mouth at  bedtime.   glimepiride 2 MG tablet Commonly known as:  AMARYL Take 2 mg by mouth at bedtime.   Melatonin 10 MG Tabs Take 10 mg by mouth at bedtime.   metoprolol succinate 50 MG 24 hr tablet Commonly known as:  TOPROL-XL Take 1 tablet (50 mg total) by mouth at bedtime. Patient is overdue for an appointment and needs to schedule for further refills 2nd attempt      Follow-up Information    Tisovec, Fransico Him, MD. Schedule an appointment as soon as possible  for a visit in 1 week(s).   Specialty:  Internal Medicine Contact information: [place] [address] [phone]          Allergies  Allergen Reactions  . Sulfa Antibiotics Swelling    Swelling of hands    Consultations:  None  Procedures/Studies: Dg Chest 2 View  Result Date: 02/25/2019 CLINICAL DATA:  Cough. Weakness. EXAM: CHEST - 2 VIEW COMPARISON:  Radiograph 02/28/2018, CT 02/27/2016 FINDINGS: Patient is chronically rotated. Unchanged heart size and mediastinal contours. There is aortic atherosclerosis. No acute airspace disease. Mild chronic interstitial changes are stable. No pleural effusion or pneumothorax. Scoliosis and degenerative change in the spine. IMPRESSION: No acute findings. Aortic Atherosclerosis (ICD10-I70.0). Electronically Signed   By: Keith Rake M.D.   On: 02/25/2019 02:51   Ct Head Wo Contrast  Result Date: 02/25/2019 CLINICAL DATA:  Confusion and ataxia EXAM: CT HEAD WITHOUT CONTRAST TECHNIQUE: Contiguous axial images were obtained from the base of the skull through the vertex without intravenous contrast. COMPARISON:  10/02/2017 FINDINGS: Brain: Chronic atrophic and white matter ischemic changes are identified. No findings to suggest acute hemorrhage, acute infarction or space-occupying mass lesion are noted. Vascular: No hyperdense vessel or unexpected calcification. Skull: Normal. Negative for fracture or focal lesion. Sinuses/Orbits: No acute finding. Other: None. IMPRESSION: Chronic atrophic and ischemic changes similar to that noted on the prior exam. No acute abnormality noted. Electronically Signed   By: Inez Catalina M.D.   On: 02/25/2019 05:16   [name] Contrast  Result Date: 02/25/2019 CLINICAL DATA:  Generalized weakness EXAM: MRI HEAD WITHOUT CONTRAST TECHNIQUE: Multiplanar, multiecho pulse sequences of the brain and surrounding structures were obtained without intravenous contrast. COMPARISON:  This 01/02/2017 FINDINGS:  Brain: No acute infarction, hemorrhage, hydrocephalus, extra-axial collection or mass lesion. Advanced generalized cerebral atrophy with ventriculomegaly. At least moderate confluent chronic small vessel ischemic gliosis in the cerebral white matter. Remote right ACA distribution infarcts. Vascular: Major flow voids are preserved Skull and upper cervical spine: Negative for marrow lesion. C2-3 and C3-4 advanced degenerative disc narrowing. Sinuses/Orbits: Bilateral cataract resection Other: Intermittent, overall mild motion artifact. IMPRESSION: 1. No acute finding. 2. Advanced cerebral atrophy. 3. Chronic small vessel ischemia and remote right ACA distribution infarcts. Electronically Signed   By: Monte Fantasia M.D.   On: 02/25/2019 10:39    Echocardiogram:  1. The left ventricle has normal systolic function, with an ejection fraction of 55-60%. The cavity size was normal. There is mildly increased left ventricular wall thickness. Left ventricular diastology could not be evaluated secondary to atrial  fibrillation. No evidence of left ventricular regional wall motion abnormalities.  2. The right ventricle has normal systolic function. The cavity was normal. There is no increase in right ventricular wall thickness.  3. Left atrial size was moderately dilated.  4. Right atrial size was mildly dilated.  5. The mitral valve is normal in structure.  6. The tricuspid valve is normal in  structure.  7. The aortic valve is tricuspid Mild calcification of the aortic valve.  8. The aortic root and ascending aorta are normal in size and structure.  9. The inferior vena cava was normal in size with <50% respiratory variability. 10. No evidence of left ventricular regional wall motion abnormalities. 11. The interatrial septum was not well visualized. 12. When compared to the prior study: 12/2016: LVEF 60-65%.  Subjective: Feels well, no events overnight. Working with therapy.   Discharge Exam: Vitals:    02/26/19 0506 02/26/19 0921  BP: 122/72 108/66  Pulse: 70 70  Resp: 20 20  Temp: 98 F (36.7 C) (!) 97.3 F (36.3 C)  SpO2: 94% 95%   General: Pt is alert, awake, not in acute distress Cardiovascular: RRR, S1/S2 +, no rubs, no gallops Respiratory: CTA bilaterally, no wheezing, no rhonchi Abdominal: Soft, NT, ND, bowel sounds + Extremities: No edema, no cyanosis  Labs: BNP (last 3 results) Recent Labs    02/28/18 1731 02/25/19 0612  BNP 205.3* 627.0*   Basic Metabolic Panel: Recent Labs  Lab 02/25/19 0229 02/26/19 0447  NA 136 138  K 3.9 4.1  CL 102 106  CO2 26 24  GLUCOSE 151* 114*  BUN 27* 29*  CREATININE 1.71* 1.65*  CALCIUM 9.1 8.9   Liver Function Tests: Recent Labs  Lab 02/25/19 0229  AST 20  ALT 17  ALKPHOS 87  BILITOT 0.7  PROT 7.1  ALBUMIN 3.8   CBC: Recent Labs  Lab 02/25/19 0229 02/26/19 0447  WBC 10.3 8.2  NEUTROABS 7.9*  --   HGB 13.9 12.6*  HCT 42.6 40.0  MCV 97.3 100.3*  PLT 198 179   Urinalysis    Component Value Date/Time   COLORURINE STRAW (A) 02/25/2019 0229   APPEARANCEUR CLEAR 02/25/2019 0229   LABSPEC 1.010 02/25/2019 0229   PHURINE 6.0 02/25/2019 0229   GLUCOSEU NEGATIVE 02/25/2019 0229   HGBUR NEGATIVE 02/25/2019 0229   BILIRUBINUR NEGATIVE 02/25/2019 0229   KETONESUR NEGATIVE 02/25/2019 0229   PROTEINUR NEGATIVE 02/25/2019 0229   UROBILINOGEN 1.0 01/21/2012 2347   NITRITE NEGATIVE 02/25/2019 0229   LEUKOCYTESUR NEGATIVE 02/25/2019 0229    Time coordinating discharge: Approximately 40 minutes  Patrecia Pour, MD  [facility] 02/26/2019, 6:47 PM Pager [phone]

## 2019-02-26 NOTE — Plan of Care (Signed)
Reviewed discharge instructions with patient; copy given. IV removed. Patient ready for discharge.  

## 2019-02-26 NOTE — Evaluation (Signed)
Occupational Therapy Evaluation Patient Details Name: [name] [name] MRN: [id number] DOB: [birth year] Today's Date: 02/26/2019    History of Present Illness 85 yo with fall from recliner ( in the middle of the night he forgot to call for help )  and family mentioned increased difficulty with ambulation lately. "[name] [name]" [name] [name] is a 85 y.o. male with a history of chronic AFib not on anticoagulation, T2DM, stage III CKD, CVA, chronic HFpEF, COPD, and HTN who presented after a fall at home.   Clinical Impression   PATIENT IS REQUIRING ASSIST WITH ADLS AND MOBILITY. PATIENT FAMILY HAS BEEN ASSISTING WITH CARE. PATIENT WOULD ALSO BENEFIT FROM HHOT TO MAXIMIZE FUNCTION.    Follow Up Recommendations  Home health OT    Equipment Recommendations       Recommendations for Other Services       Precautions / Restrictions Precautions Precautions: Fall Restrictions Weight Bearing Restrictions: No      Mobility Bed Mobility         Supine to sit: Mod assist        Transfers       Sit to Stand: Mod assist              Balance                                           ADL either performed or assessed with clinical judgement   ADL Overall ADL's : Needs assistance/impaired Eating/Feeding: Set up   Grooming: Wash/dry hands;Wash/dry face;Supervision/safety;Set up;Sitting   Upper Body Bathing: Minimal assistance;Sitting   Lower Body Bathing: Maximal assistance;Sit to/from stand   Upper Body Dressing : Minimal assistance;Sitting   Lower Body Dressing: Total assistance;Sit to/from stand                 General ADL Comments: PATIENT FAMILY WAS PROVIDING ASSIST, UNSURE IF PATIENT IS AT BASELINE.      Vision Baseline Vision/History: Wears glasses Wears Glasses: Reading only Patient Visual Report: No change from baseline       Perception     Praxis      Pertinent Vitals/Pain Pain Assessment: No/denies pain     Hand Dominance Right    Extremity/Trunk Assessment Upper Extremity Assessment Upper Extremity Assessment: Generalized weakness           Communication Communication Communication: HOH   Cognition Arousal/Alertness: Awake/alert Behavior During Therapy: WFL for tasks assessed/performed Overall Cognitive Status: History of cognitive impairments - at baseline                                 General Comments: very pleasant and follow all commands, humorous at times as well.    General Comments       Exercises     Shoulder Instructions      Home Living Family/patient expects to be discharged to:: Private residence Living Arrangements: Children Available Help at Discharge: Available 24 hours/day Type of Home: House Home Access: Ramped entrance     [place]: One level     Bathroom Shower/Tub: Hospital doctor Toilet: Handicapped height     Home Equipment: Environmental consultant - 2 wheels;Wheelchair - manual;Bedside commode   Additional Comments: PATIENT STATES HE STANDS IN THE SHOWER      Prior Functioning/Environment Level of Independence: Needs assistance  Gait / Transfers  Assistance Needed: with RW and assit from caregivers  ADL's / Homemaking Assistance Needed: PATIENT HAS ASSIST WITH ADLS            OT Problem List:        OT Treatment/Interventions:      OT Goals(Current goals can be found in the care plan section) Acute Rehab OT Goals Patient Stated Goal: GO HOME OT Goal Formulation: With patient  OT Frequency:     Barriers to D/C:            Co-evaluation              AM-PAC OT "6 Clicks" Daily Activity     Outcome Measure Help from another person eating meals?: A Little Help from another person taking care of personal grooming?: A Little Help from another person toileting, which includes using toliet, bedpan, or urinal?: A Lot Help from another person bathing (including washing, rinsing, drying)?: A Lot Help from another person to put on and  taking off regular upper body clothing?: A Lot Help from another person to put on and taking off regular lower body clothing?: Total 6 Click Score: 13   End of Session Nurse Communication: (OK THERAPY, STATED PATIENT WILL BE LEAVING TODAY)  Activity Tolerance: Patient tolerated treatment well Patient left: in bed;with call bell/phone within reach;with bed alarm set                   Time: 1110-1140 OT Time Calculation (min): 30 min Charges:  OT General Charges $OT Visit: 1 Visit OT Evaluation $OT Eval Low Complexity: 1 Low  6 CLICKS  [name] [name] 02/26/2019, 11:40 AM

## 2019-02-26 NOTE — Plan of Care (Signed)
Patient lying in bed this morning; a&o. No complaints of pain at this time. Will continue to monitor.

## 2019-03-08 ENCOUNTER — Ambulatory Visit: Payer: Medicare Other | Admitting: Podiatry

## 2019-03-22 DIAGNOSIS — E7800 Pure hypercholesterolemia, unspecified: Secondary | ICD-10-CM | POA: Diagnosis not present

## 2019-03-22 DIAGNOSIS — N183 Chronic kidney disease, stage 3 (moderate): Secondary | ICD-10-CM | POA: Diagnosis not present

## 2019-03-22 DIAGNOSIS — Z125 Encounter for screening for malignant neoplasm of prostate: Secondary | ICD-10-CM | POA: Diagnosis not present

## 2019-03-22 DIAGNOSIS — M859 Disorder of bone density and structure, unspecified: Secondary | ICD-10-CM | POA: Diagnosis not present

## 2019-03-29 DIAGNOSIS — G4733 Obstructive sleep apnea (adult) (pediatric): Secondary | ICD-10-CM | POA: Diagnosis not present

## 2019-03-29 DIAGNOSIS — I130 Hypertensive heart and chronic kidney disease with heart failure and stage 1 through stage 4 chronic kidney disease, or unspecified chronic kidney disease: Secondary | ICD-10-CM | POA: Diagnosis not present

## 2019-03-29 DIAGNOSIS — E7800 Pure hypercholesterolemia, unspecified: Secondary | ICD-10-CM | POA: Diagnosis not present

## 2019-03-29 DIAGNOSIS — Z0000 Encounter for general adult medical examination without abnormal findings: Secondary | ICD-10-CM | POA: Diagnosis not present

## 2019-08-07 ENCOUNTER — Other Ambulatory Visit (HOSPITAL_COMMUNITY): Payer: Self-pay | Admitting: Internal Medicine

## 2019-08-07 ENCOUNTER — Ambulatory Visit (HOSPITAL_COMMUNITY)
Admission: RE | Admit: 2019-08-07 | Discharge: 2019-08-07 | Disposition: A | Discharge status: Home / Self Care | Payer: Medicare Other | Source: Ambulatory Visit | Attending: Internal Medicine | Admitting: Internal Medicine

## 2019-08-07 ENCOUNTER — Other Ambulatory Visit: Payer: Self-pay

## 2019-08-07 DIAGNOSIS — R600 Localized edema: Secondary | ICD-10-CM | POA: Diagnosis not present

## 2019-08-07 DIAGNOSIS — I509 Heart failure, unspecified: Secondary | ICD-10-CM | POA: Diagnosis not present

## 2019-08-07 DIAGNOSIS — I130 Hypertensive heart and chronic kidney disease with heart failure and stage 1 through stage 4 chronic kidney disease, or unspecified chronic kidney disease: Secondary | ICD-10-CM | POA: Diagnosis not present

## 2019-08-07 DIAGNOSIS — R609 Edema, unspecified: Secondary | ICD-10-CM | POA: Diagnosis not present

## 2019-08-07 DIAGNOSIS — I480 Paroxysmal atrial fibrillation: Secondary | ICD-10-CM | POA: Diagnosis not present

## 2019-08-07 NOTE — Progress Notes (Signed)
Left lower extremity venous duplex completed. Preliminary results in Chart review CV Proc. [facility], [place] 8/17/20202 5:34 PM

## 2019-08-30 DIAGNOSIS — S31809A Unspecified open wound of unspecified buttock, initial encounter: Secondary | ICD-10-CM | POA: Diagnosis not present

## 2019-08-30 DIAGNOSIS — L989 Disorder of the skin and subcutaneous tissue, unspecified: Secondary | ICD-10-CM | POA: Diagnosis not present

## 2019-08-30 DIAGNOSIS — N3946 Mixed incontinence: Secondary | ICD-10-CM | POA: Diagnosis not present

## 2019-08-30 DIAGNOSIS — M6289 Other specified disorders of muscle: Secondary | ICD-10-CM | POA: Diagnosis not present

## 2019-09-27 ENCOUNTER — Encounter (HOSPITAL_COMMUNITY): Payer: Self-pay | Admitting: Emergency Medicine

## 2019-09-27 ENCOUNTER — Emergency Department (HOSPITAL_COMMUNITY): Payer: Medicare Other

## 2019-09-27 ENCOUNTER — Other Ambulatory Visit: Payer: Self-pay

## 2019-09-27 ENCOUNTER — Inpatient Hospital Stay (HOSPITAL_COMMUNITY)
Admission: EM | Admit: 2019-09-27 | Discharge: 2019-10-06 | DRG: 176 | Disposition: A | Discharge status: Skilled Nursing Facility | Payer: Medicare Other | Attending: Internal Medicine | Admitting: Internal Medicine

## 2019-09-27 DIAGNOSIS — E1122 Type 2 diabetes mellitus with diabetic chronic kidney disease: Secondary | ICD-10-CM | POA: Diagnosis not present

## 2019-09-27 DIAGNOSIS — R092 Respiratory arrest: Secondary | ICD-10-CM | POA: Diagnosis not present

## 2019-09-27 DIAGNOSIS — E785 Hyperlipidemia, unspecified: Secondary | ICD-10-CM | POA: Diagnosis not present

## 2019-09-27 DIAGNOSIS — Z66 Do not resuscitate: Secondary | ICD-10-CM | POA: Diagnosis not present

## 2019-09-27 DIAGNOSIS — Z9119 Patient's noncompliance with other medical treatment and regimen: Secondary | ICD-10-CM | POA: Diagnosis not present

## 2019-09-27 DIAGNOSIS — M1990 Unspecified osteoarthritis, unspecified site: Secondary | ICD-10-CM | POA: Diagnosis present

## 2019-09-27 DIAGNOSIS — R739 Hyperglycemia, unspecified: Secondary | ICD-10-CM | POA: Diagnosis present

## 2019-09-27 DIAGNOSIS — Z8249 Family history of ischemic heart disease and other diseases of the circulatory system: Secondary | ICD-10-CM | POA: Diagnosis not present

## 2019-09-27 DIAGNOSIS — I69354 Hemiplegia and hemiparesis following cerebral infarction affecting left non-dominant side: Secondary | ICD-10-CM | POA: Diagnosis not present

## 2019-09-27 DIAGNOSIS — F0390 Unspecified dementia without behavioral disturbance: Secondary | ICD-10-CM | POA: Diagnosis not present

## 2019-09-27 DIAGNOSIS — R569 Unspecified convulsions: Secondary | ICD-10-CM | POA: Diagnosis not present

## 2019-09-27 DIAGNOSIS — I248 Other forms of acute ischemic heart disease: Secondary | ICD-10-CM | POA: Diagnosis present

## 2019-09-27 DIAGNOSIS — F339 Major depressive disorder, recurrent, unspecified: Secondary | ICD-10-CM | POA: Diagnosis not present

## 2019-09-27 DIAGNOSIS — G40909 Epilepsy, unspecified, not intractable, without status epilepticus: Secondary | ICD-10-CM

## 2019-09-27 DIAGNOSIS — R2689 Other abnormalities of gait and mobility: Secondary | ICD-10-CM | POA: Diagnosis not present

## 2019-09-27 DIAGNOSIS — F0150 Vascular dementia without behavioral disturbance: Secondary | ICD-10-CM | POA: Diagnosis not present

## 2019-09-27 DIAGNOSIS — R0602 Shortness of breath: Secondary | ICD-10-CM | POA: Diagnosis present

## 2019-09-27 DIAGNOSIS — N1831 Chronic kidney disease, stage 3a: Secondary | ICD-10-CM | POA: Diagnosis not present

## 2019-09-27 DIAGNOSIS — J449 Chronic obstructive pulmonary disease, unspecified: Secondary | ICD-10-CM | POA: Diagnosis not present

## 2019-09-27 DIAGNOSIS — Z7982 Long term (current) use of aspirin: Secondary | ICD-10-CM | POA: Diagnosis not present

## 2019-09-27 DIAGNOSIS — Z7984 Long term (current) use of oral hypoglycemic drugs: Secondary | ICD-10-CM

## 2019-09-27 DIAGNOSIS — G4730 Sleep apnea, unspecified: Secondary | ICD-10-CM | POA: Diagnosis not present

## 2019-09-27 DIAGNOSIS — R778 Other specified abnormalities of plasma proteins: Secondary | ICD-10-CM | POA: Diagnosis present

## 2019-09-27 DIAGNOSIS — G4733 Obstructive sleep apnea (adult) (pediatric): Secondary | ICD-10-CM | POA: Diagnosis present

## 2019-09-27 DIAGNOSIS — G4089 Other seizures: Secondary | ICD-10-CM | POA: Diagnosis not present

## 2019-09-27 DIAGNOSIS — R251 Tremor, unspecified: Secondary | ICD-10-CM | POA: Diagnosis present

## 2019-09-27 DIAGNOSIS — E1165 Type 2 diabetes mellitus with hyperglycemia: Secondary | ICD-10-CM | POA: Diagnosis not present

## 2019-09-27 DIAGNOSIS — M6281 Muscle weakness (generalized): Secondary | ICD-10-CM | POA: Diagnosis not present

## 2019-09-27 DIAGNOSIS — F0280 Dementia in other diseases classified elsewhere without behavioral disturbance: Secondary | ICD-10-CM | POA: Diagnosis not present

## 2019-09-27 DIAGNOSIS — E876 Hypokalemia: Secondary | ICD-10-CM | POA: Diagnosis not present

## 2019-09-27 DIAGNOSIS — Z882 Allergy status to sulfonamides status: Secondary | ICD-10-CM | POA: Diagnosis not present

## 2019-09-27 DIAGNOSIS — K219 Gastro-esophageal reflux disease without esophagitis: Secondary | ICD-10-CM | POA: Diagnosis present

## 2019-09-27 DIAGNOSIS — R279 Unspecified lack of coordination: Secondary | ICD-10-CM | POA: Diagnosis not present

## 2019-09-27 DIAGNOSIS — I2692 Saddle embolus of pulmonary artery without acute cor pulmonale: Secondary | ICD-10-CM | POA: Diagnosis not present

## 2019-09-27 DIAGNOSIS — I2699 Other pulmonary embolism without acute cor pulmonale: Secondary | ICD-10-CM | POA: Diagnosis present

## 2019-09-27 DIAGNOSIS — R0902 Hypoxemia: Secondary | ICD-10-CM | POA: Diagnosis not present

## 2019-09-27 DIAGNOSIS — R488 Other symbolic dysfunctions: Secondary | ICD-10-CM | POA: Diagnosis not present

## 2019-09-27 DIAGNOSIS — I2602 Saddle embolus of pulmonary artery with acute cor pulmonale: Secondary | ICD-10-CM

## 2019-09-27 DIAGNOSIS — I130 Hypertensive heart and chronic kidney disease with heart failure and stage 1 through stage 4 chronic kidney disease, or unspecified chronic kidney disease: Secondary | ICD-10-CM | POA: Diagnosis not present

## 2019-09-27 DIAGNOSIS — I5032 Chronic diastolic (congestive) heart failure: Secondary | ICD-10-CM | POA: Diagnosis present

## 2019-09-27 DIAGNOSIS — R296 Repeated falls: Secondary | ICD-10-CM | POA: Diagnosis present

## 2019-09-27 DIAGNOSIS — I361 Nonrheumatic tricuspid (valve) insufficiency: Secondary | ICD-10-CM | POA: Diagnosis not present

## 2019-09-27 DIAGNOSIS — Z743 Need for continuous supervision: Secondary | ICD-10-CM | POA: Diagnosis not present

## 2019-09-27 DIAGNOSIS — I5030 Unspecified diastolic (congestive) heart failure: Secondary | ICD-10-CM | POA: Diagnosis not present

## 2019-09-27 DIAGNOSIS — G459 Transient cerebral ischemic attack, unspecified: Secondary | ICD-10-CM | POA: Diagnosis not present

## 2019-09-27 DIAGNOSIS — N183 Chronic kidney disease, stage 3 unspecified: Secondary | ICD-10-CM | POA: Diagnosis not present

## 2019-09-27 DIAGNOSIS — I482 Chronic atrial fibrillation, unspecified: Secondary | ICD-10-CM | POA: Diagnosis present

## 2019-09-27 DIAGNOSIS — I4891 Unspecified atrial fibrillation: Secondary | ICD-10-CM | POA: Diagnosis not present

## 2019-09-27 DIAGNOSIS — Z20828 Contact with and (suspected) exposure to other viral communicable diseases: Secondary | ICD-10-CM | POA: Diagnosis not present

## 2019-09-27 DIAGNOSIS — I351 Nonrheumatic aortic (valve) insufficiency: Secondary | ICD-10-CM | POA: Diagnosis not present

## 2019-09-27 DIAGNOSIS — R4020 Unspecified coma: Secondary | ICD-10-CM | POA: Diagnosis not present

## 2019-09-27 DIAGNOSIS — E119 Type 2 diabetes mellitus without complications: Secondary | ICD-10-CM | POA: Diagnosis not present

## 2019-09-27 DIAGNOSIS — I10 Essential (primary) hypertension: Secondary | ICD-10-CM | POA: Diagnosis not present

## 2019-09-27 DIAGNOSIS — R0689 Other abnormalities of breathing: Secondary | ICD-10-CM | POA: Diagnosis not present

## 2019-09-27 DIAGNOSIS — R05 Cough: Secondary | ICD-10-CM | POA: Diagnosis not present

## 2019-09-27 LAB — CBC WITH DIFFERENTIAL/PLATELET
Abs Immature Granulocytes: 0.08 10*3/uL — ABNORMAL HIGH (ref 0.00–0.07)
Basophils Absolute: 0.1 10*3/uL (ref 0.0–0.1)
Basophils Relative: 0 %
Eosinophils Absolute: 0.2 10*3/uL (ref 0.0–0.5)
Eosinophils Relative: 1 %
HCT: 39.7 % (ref 39.0–52.0)
Hemoglobin: 13.2 g/dL (ref 13.0–17.0)
Immature Granulocytes: 1 %
Lymphocytes Relative: 6 %
Lymphs Abs: 0.9 10*3/uL (ref 0.7–4.0)
MCH: 32.3 pg (ref 26.0–34.0)
MCHC: 33.2 g/dL (ref 30.0–36.0)
MCV: 97.1 fL (ref 80.0–100.0)
Monocytes Absolute: 1 10*3/uL (ref 0.1–1.0)
Monocytes Relative: 7 %
Neutro Abs: 13.2 10*3/uL — ABNORMAL HIGH (ref 1.7–7.7)
Neutrophils Relative %: 85 %
Platelets: 192 10*3/uL (ref 150–400)
RBC: 4.09 MIL/uL — ABNORMAL LOW (ref 4.22–5.81)
RDW: 13.6 % (ref 11.5–15.5)
WBC: 15.4 10*3/uL — ABNORMAL HIGH (ref 4.0–10.5)
nRBC: 0 % (ref 0.0–0.2)

## 2019-09-27 LAB — COMPREHENSIVE METABOLIC PANEL
ALT: 22 U/L (ref 0–44)
AST: 33 U/L (ref 15–41)
Albumin: 3.4 g/dL — ABNORMAL LOW (ref 3.5–5.0)
Alkaline Phosphatase: 101 U/L (ref 38–126)
Anion gap: 11 (ref 5–15)
BUN: 21 mg/dL (ref 8–23)
CO2: 20 mmol/L — ABNORMAL LOW (ref 22–32)
Calcium: 8.8 mg/dL — ABNORMAL LOW (ref 8.9–10.3)
Chloride: 104 mmol/L (ref 98–111)
Creatinine, Ser: 1.5 mg/dL — ABNORMAL HIGH (ref 0.61–1.24)
GFR calc Af Amer: 47 mL/min — ABNORMAL LOW (ref >60)
GFR calc non Af Amer: 40 mL/min — ABNORMAL LOW (ref >60)
Glucose, Bld: 351 mg/dL — ABNORMAL HIGH (ref 70–99)
Potassium: 4.9 mmol/L (ref 3.5–5.1)
Sodium: 135 mmol/L (ref 135–145)
Total Bilirubin: 1.7 mg/dL — ABNORMAL HIGH (ref 0.3–1.2)
Total Protein: 6.4 g/dL — ABNORMAL LOW (ref 6.5–8.1)

## 2019-09-27 LAB — TROPONIN I (HIGH SENSITIVITY): Troponin I (High Sensitivity): 32 ng/L — ABNORMAL HIGH (ref <18)

## 2019-09-27 LAB — BRAIN NATRIURETIC PEPTIDE: B Natriuretic Peptide: 154 pg/mL — ABNORMAL HIGH (ref 0.0–100.0)

## 2019-09-27 LAB — I-STAT CHEM 8, ED
BUN: 28 mg/dL — ABNORMAL HIGH (ref 8–23)
Calcium, Ion: 1.15 mmol/L (ref 1.15–1.40)
Chloride: 104 mmol/L (ref 98–111)
Creatinine, Ser: 1.3 mg/dL — ABNORMAL HIGH (ref 0.61–1.24)
Glucose, Bld: 350 mg/dL — ABNORMAL HIGH (ref 70–99)
HCT: 40 % (ref 39.0–52.0)
Hemoglobin: 13.6 g/dL (ref 13.0–17.0)
Potassium: 4.8 mmol/L (ref 3.5–5.1)
Sodium: 137 mmol/L (ref 135–145)
TCO2: 24 mmol/L (ref 22–32)

## 2019-09-27 LAB — SARS CORONAVIRUS 2 BY RT PCR (HOSPITAL ORDER, PERFORMED IN ~~LOC~~ HOSPITAL LAB): SARS Coronavirus 2: NEGATIVE

## 2019-09-27 LAB — MAGNESIUM: Magnesium: 2 mg/dL (ref 1.7–2.4)

## 2019-09-27 MED ORDER — HEPARIN BOLUS VIA INFUSION
5000.0000 [IU] | Freq: Once | INTRAVENOUS | Status: AC
  Administered 2019-09-28: 5000 [IU] via INTRAVENOUS
  Filled 2019-09-27: qty 5000

## 2019-09-27 MED ORDER — HEPARIN (PORCINE) 25000 UT/250ML-% IV SOLN
1100.0000 [IU]/h | INTRAVENOUS | Status: DC
  Administered 2019-09-28: 1200 [IU]/h via INTRAVENOUS
  Administered 2019-09-28: 1100 [IU]/h via INTRAVENOUS
  Administered 2019-09-29 – 2019-10-01 (×3): 1000 [IU]/h via INTRAVENOUS
  Filled 2019-09-27 (×5): qty 250

## 2019-09-27 MED ORDER — IOHEXOL 350 MG/ML SOLN
80.0000 mL | Freq: Once | INTRAVENOUS | Status: AC | PRN
  Administered 2019-09-27: 80 mL via INTRAVENOUS

## 2019-09-27 MED ORDER — SODIUM CHLORIDE 0.9 % IV BOLUS
500.0000 mL | Freq: Once | INTRAVENOUS | Status: AC
  Administered 2019-09-27: 500 mL via INTRAVENOUS

## 2019-09-27 NOTE — ED Provider Notes (Signed)
[facility] EMERGENCY DEPARTMENT Provider Note   CSN: [id number] Arrival date & time: 09/27/19  2141     History   Chief Complaint Chief Complaint  Patient presents with   Transient Ischemic Attack    HPI [name] [name] is a 85 y.o. male hx of DM, dementia, previous stroke with L sided tremors, left eye deviation. Patient lives at home with family. He has a CNA that comes to help to take care of him.  The son states that the CNA saw the patient having tremors in the left arm and became altered for about 3 minutes.  EMS was called and patient does have a history of prior stroke with left-sided residual weakness.  Patient has no history of seizures however.  Patient is demented at baseline and amicable to get history.  History per the son as well as EMS.     The history is provided by a relative and the EMS personnel.  Level V caveat- AMS   Past Medical History:  Diagnosis Date   Arthritis    Chronic atrial fibrillation ([place])    Diabetes mellitus without complication ([place])    Hyperlipidemia    Hypertension    Sleep apnea    no cpap used, sleep study was several yrs ago   Urinary retention foley last changed 3 weeks ago    Patient Active Problem List   Diagnosis Date Noted   CKD (chronic kidney disease), stage III 02/25/2019   Chronic diastolic CHF (congestive heart failure) ([place]) 02/25/2019   Gastroenteritis 10/06/2017   Hypokalemia 10/02/2017   Dehydration 10/02/2017   Syncope 10/02/2017   Cerebral embolism with cerebral infarction 01/04/2017   Dementia without behavioral disturbance ([place])    Falls 01/03/2017   Generalized weakness 01/01/2017   Frequent falls 01/01/2017   Acute hypoxemic respiratory failure ([place]) 04/09/2016   COPD (chronic obstructive pulmonary disease) with chronic bronchitis ([place]) 03/12/2016   HCAP (healthcare-associated pneumonia) 02/26/2016   Elevated troponin 02/26/2016   Elevated lactic acid level  01/28/2016   Sepsis ([place]) 01/28/2016   Acute renal failure superimposed on stage 3 chronic kidney disease ([place]) 04/21/2015   Hyperglycemia 04/21/2015   Bronchitis 04/21/2015   Tachycardia 04/21/2015   BPH (benign prostatic hyperplasia) 04/21/2015   Dyspnea 12/28/2012   Fall 12/28/2012   OTHER DYSPNEA AND RESPIRATORY ABNORMALITIES 05/27/2010   Hyperlipidemia 11/27/2008   HYPERTENSION, BENIGN 11/27/2008   Atrial fibrillation, chronic 11/27/2008   GERD 11/27/2008   BPH/LUTS W/O OBSTRUCTION 11/27/2008   ARTHRITIS 11/27/2008    Past Surgical History:  Procedure Laterality Date   BACK SURGERY  yrs ago   lower back surgery   PROSTATE SURGERY  yrs ago, no cancer   TONSILLECTOMY  as child   TRANSURETHRAL RESECTION OF PROSTATE  02/22/2012   Procedure: TRANSURETHRAL RESECTION OF THE PROSTATE WITH GYRUS INSTRUMENTS;  Surgeon: Molli Hazard, MD;  Location: [facility] ORS;  Service: Urology;  Laterality: N/A;            Home Medications    Prior to Admission medications   Medication Sig Start Date End Date Taking? Authorizing Provider  aspirin 325 MG tablet Take 1 tablet (325 mg total) by mouth daily. Patient not taking: Reported on 02/25/2019 01/05/17   Florencia Reasons, MD  atorvastatin (LIPITOR) 20 MG tablet Take 1 tablet (20 mg total) by mouth daily at 6 PM. 01/04/17   Florencia Reasons, MD  CALCIUM PO Take 0.5 tablets by mouth daily.    [provider]  donepezil (ARICEPT) 10 MG tablet Take 10 mg by mouth at bedtime.  01/09/16   [provider]  finasteride (PROSCAR) 5 MG tablet Take 5 mg by mouth at bedtime.     [provider]  glimepiride (AMARYL) 2 MG tablet Take 2 mg by mouth at bedtime.  12/16/18   [provider]  Melatonin 10 MG TABS Take 10 mg by mouth at bedtime.    [provider]  metoprolol succinate (TOPROL-XL) 50 MG 24 hr tablet Take 1 tablet (50 mg total) by mouth at bedtime. Patient is overdue for an appointment and needs to  schedule for further refills 2nd attempt 10/01/17   Sherren Mocha, MD    Family History Family History  Problem Relation Age of Onset   Heart attack Father     Social History Social History   Tobacco Use   Smoking status: Former Smoker    Packs/day: 0.50    Years: 50.00    Pack years: 25.00    Types: Cigarettes    Quit date: 12/21/1980    Years since quitting: 38.7   Smokeless tobacco: Never Used  Substance Use Topics   Alcohol use: Yes    Alcohol/week: 0.0 standard drinks    Comment: rare use   Drug use: No     Allergies   Sulfa antibiotics   Review of Systems Review of Systems  Neurological: Positive for tremors and weakness.  All other systems reviewed and are negative.    Physical Exam Updated Vital Signs BP 124/82    Pulse 100    Temp 98.3 F (36.8 C) (Oral)    Resp 18    Ht 5\' 8"  (1.727 m)    Wt 72.6 kg    SpO2 95%    BMI 24.33 kg/m   Physical Exam Vitals signs and nursing note reviewed.  Constitutional:      Comments: Demented, NAD   HENT:     Head: Normocephalic.     Nose: Nose normal.     Mouth/Throat:     Mouth: Mucous membranes are moist.  Eyes:     Extraocular Movements: Extraocular movements intact.     Pupils: Pupils are equal, round, and reactive to light.  Neck:     Musculoskeletal: Normal range of motion.  Cardiovascular:     Rate and Rhythm: Normal rate and regular rhythm.     Pulses: Normal pulses.     Heart sounds: Normal heart sounds.  Pulmonary:     Effort: Pulmonary effort is normal.     Breath sounds: Normal breath sounds.  Abdominal:     General: Abdomen is flat.  Musculoskeletal: Normal range of motion.  Skin:    General: Skin is warm.     Capillary Refill: Capillary refill takes less than 2 seconds.  Neurological:     Comments: Demented, A & O x 1. Strength 3/5 L arm and leg, 4/5 R arm and leg (chronic). No seizure like activity   Psychiatric:     Comments: Unable       ED Treatments / Results   Labs (all labs ordered are listed, but only abnormal results are displayed) Labs Reviewed  CBC WITH DIFFERENTIAL/PLATELET - Abnormal; Notable for the following components:      Result Value   WBC 15.4 (*)    RBC 4.09 (*)    Neutro Abs 13.2 (*)    Abs Immature Granulocytes 0.08 (*)    All other components within normal limits  COMPREHENSIVE METABOLIC  PANEL - Abnormal; Notable for the following components:   CO2 20 (*)    Glucose, Bld 351 (*)    Creatinine, Ser 1.50 (*)    Calcium 8.8 (*)    Total Protein 6.4 (*)    Albumin 3.4 (*)    Total Bilirubin 1.7 (*)    GFR calc non Af Amer 40 (*)    GFR calc Af Amer 47 (*)    All other components within normal limits  BRAIN NATRIURETIC PEPTIDE - Abnormal; Notable for the following components:   B Natriuretic Peptide 154.0 (*)    All other components within normal limits  I-STAT CHEM 8, ED - Abnormal; Notable for the following components:   BUN 28 (*)    Creatinine, Ser 1.30 (*)    Glucose, Bld 350 (*)    All other components within normal limits  TROPONIN I (HIGH SENSITIVITY) - Abnormal; Notable for the following components:   Troponin I (High Sensitivity) 32 (*)    All other components within normal limits  SARS CORONAVIRUS 2 (HOSPITAL ORDER, [place] [facility])  MAGNESIUM  URINALYSIS, ROUTINE W REFLEX MICROSCOPIC  TROPONIN I (HIGH SENSITIVITY)    EKG EKG Interpretation  Date/Time:  Wednesday September 27 2019 21:52:16 EDT Ventricular Rate:  103 PR Interval:    QRS Duration: 128 QT Interval:  371 QTC Calculation: 486 R Axis:   56 Text Interpretation:  Atrial fibrillation Right bundle branch block No significant change since last tracing Confirmed by Wandra Arthurs [phone]) on 09/27/2019 9:57:49 PM   Radiology Ct Head Wo Contrast  Result Date: 09/27/2019 CLINICAL DATA:  Altered level of consciousness EXAM: CT HEAD WITHOUT CONTRAST TECHNIQUE: Contiguous axial images were obtained from the base of the skull  through the vertex without intravenous contrast. COMPARISON:  February 25, 2019 FINDINGS: Brain: No evidence of acute territorial infarction, hemorrhage, hydrocephalus,extra-axial collection or mass lesion/mass effect. There is dilatation the ventricles and sulci consistent with age-related atrophy. Low-attenuation changes in the deep white matter consistent with small vessel ischemia. Vascular: No hyperdense vessel or unexpected calcification. Skull: The skull is intact. No fracture or focal lesion identified. Sinuses/Orbits: The visualized paranasal sinuses and mastoid air cells are clear. The orbits and globes intact. Other: None IMPRESSION: No acute intracranial abnormality. Findings consistent with age related atrophy and chronic small vessel ischemia Electronically Signed   By: Prudencio Pair M.D.   On: 09/27/2019 22:28   Ct Angio Chest Pe W And/or Wo Contrast  Result Date: 09/27/2019 CLINICAL DATA:  Sudden onset upper extremity tremors and fixed left-sided gaze for 10 seconds. EXAM: CT ANGIOGRAPHY CHEST WITH CONTRAST TECHNIQUE: Multidetector CT imaging of the chest was performed using the standard protocol during bolus administration of intravenous contrast. Multiplanar CT image reconstructions and MIPs were obtained to evaluate the vascular anatomy. CONTRAST:  44mL OMNIPAQUE IOHEXOL 350 MG/ML SOLN COMPARISON:  Chest x-ray from same day. CT chest dated February 28, 2016. FINDINGS: Cardiovascular: Satisfactory opacification of the pulmonary arteries to the segmental level. Positive for acute pulmonary emboli with saddle pulmonary embolus and bilateral lobar and segmental pulmonary emboli involving all lobes. Elevated RV/LV ratio of 2.7 normal heart size. No pericardial effusion. No thoracic aortic aneurysm. Coronary, aortic arch, and branch vessel atherosclerotic vascular disease. Mediastinum/Nodes: No enlarged mediastinal, hilar, or axillary lymph nodes. Thyroid gland, trachea, and esophagus demonstrate no  significant findings. Lungs/Pleura: Scattered parenchymal scarring corresponding to sites of prior infection/inflammation seen on prior chest CT from 2017. Mild mosaic perfusion. No  focal consolidation, pleural effusion, or pneumothorax. Left lower lobe peribronchial thickening. No suspicious pulmonary nodule. Upper Abdomen: No acute abnormality. Musculoskeletal: No chest wall abnormality. No acute or significant osseous findings. T2 and T8 vertebral body hemangiomas. Review of the MIP images confirms the above findings. IMPRESSION: 1. Positive for acute PE with CT evidence of right heart strain (RV/LV Ratio = 2.7) consistent with at least submassive (intermediate risk) PE. The presence of right heart strain has been associated with an increased risk of morbidity and mortality. Please activate Code PE by paging [phone]. 2.  Aortic atherosclerosis (ICD10-I70.0). Critical Value/emergent results were called by telephone at the time of interpretation on 09/27/2019 at 11:26 pm to providerDAVID Tyger Oka , who verbally acknowledged these results. Electronically Signed   By: Titus Dubin M.D.   On: 09/27/2019 23:28   Dg Chest Port 1 View  Result Date: 09/27/2019 CLINICAL DATA:  Cough EXAM: PORTABLE CHEST 1 VIEW COMPARISON:  February 25, 2019 FINDINGS: Unchanged cardiomediastinal silhouette. Aortic knob calcifications. Both lungs are clear. The visualized skeletal structures are unremarkable. IMPRESSION: No acute cardiopulmonary process. Electronically Signed   By: Prudencio Pair M.D.   On: 09/27/2019 22:13    Procedures Procedures (including critical care time)  CRITICAL CARE Performed by: Wandra Arthurs   Total critical care time:30  minutes  Critical care time was exclusive of separately billable procedures and treating other patients.  Critical care was necessary to treat or prevent imminent or life-threatening deterioration.  Critical care was time spent personally by me on the following activities:  development of treatment plan with patient and/or surrogate as well as nursing, discussions with consultants, evaluation of patient's response to treatment, examination of patient, obtaining history from patient or surrogate, ordering and performing treatments and interventions, ordering and review of laboratory studies, ordering and review of radiographic studies, pulse oximetry and re-evaluation of patient's condition.   Medications Ordered in ED Medications  sodium chloride 0.9 % bolus 500 mL (500 mLs Intravenous New Bag/Given 09/27/19 2233)  iohexol (OMNIPAQUE) 350 MG/ML injection 80 mL (80 mLs Intravenous Contrast Given 09/27/19 2257)     Initial Impression / Assessment and Plan / ED Course  I have reviewed the triage vital signs and the nursing notes.  Pertinent labs & imaging results that were available during my care of the patient were reviewed by me and considered in my medical decision making (see chart for details).       [name] [name] is a 85 y.o. male hx of dementia here with AMS, seizure like episode. Patient has hx of dementia and didn't have seizure before. Consider new onset seizure vs dementia vs electrolyte abnormality. Talked to Dr. Cheral Marker and since patient has no hx of seizure and this is not a definite seizure, will hold off on antiepileptics and monitor mental status.   11:42 PM His CTA showed bilateral PE with saddle embolus. Talked to son, who agreed with IV heparin, wants to avoid aggressive treatment like TPA. Started on heparin drip. Hospitalist to admit    Final Clinical Impressions(s) / ED Diagnoses   Final diagnoses:  Acute saddle pulmonary embolism with acute cor pulmonale [facility])    ED Discharge Orders    None       Drenda Freeze, MD 09/27/19 2342

## 2019-09-27 NOTE — ED Notes (Signed)
Patient transported to CT 

## 2019-09-27 NOTE — Progress Notes (Signed)
ANTICOAGULATION CONSULT NOTE - Initial Consult  Pharmacy Consult for heparin Indication: pulmonary embolus  Allergies  Allergen Reactions  . Sulfa Antibiotics Swelling    Swelling of hands    Patient Measurements: Height: 5\' 8"  (172.7 cm) Weight: 160 lb (72.6 kg) IBW/kg (Calculated) : 68.4  Vital Signs: Temp: 98.3 F (36.8 C) (10/07 2153) Temp Source: Oral (10/07 2153) BP: 124/82 (10/07 2245) Pulse Rate: 100 (10/07 2315)  Labs: Recent Labs    09/27/19 2211 09/27/19 2219  HGB 13.2 13.6  HCT 39.7 40.0  PLT 192  --   CREATININE 1.50* 1.30*  TROPONINIHS 32*  --     Estimated Creatinine Clearance: 35.8 mL/min (A) (by C-G formula based on SCr of 1.3 mg/dL (H)).  Assessment: CC/HPI: 85 yo m presenting with sudden upper extremity tremors and left sized gaze  PMH: dementia, afib, hx stroke  Anticoag: none pta - iv hep for PE   Renal: SCr 1.3  Pulm: CTA acute PE RHS LV RV ratio 2.7 - son does not want tPA  Heme/Onc: H&H 13.6/40, Plt 192  Goal of Therapy:  Heparin level 0.3-0.7 units/ml Monitor platelets by anticoagulation protocol: Yes   Plan:  Heparin bolus 5000 units x 1 Heparin gtt 1200 units/hr Initial HL 0800 Daily HL CBC F/U plans for oral [facility]  Levester Fresh, PharmD, BCPS, BCCCP Clinical Pharmacist [phone]  Please check AMION for all [facility] numbers  09/27/2019 11:57 PM

## 2019-09-27 NOTE — ED Triage Notes (Signed)
Pt brought to ED by [facility] from home for c/o of sudden tremors on upper extremities and fixated left side gaze for around 10 sec. Pt has Hx of dementia baseline AO x 2 and A fib, no neuro deficit noticed on EMS arrival, pt has a chronic left side weakness from prior stroke. CBG 3701, HR 100. BP 118/88, SPO2 94% RA.

## 2019-09-27 NOTE — H&P (Signed)
[name] [name] [id number] DOB: Feb 03, 1926 DOA: 09/27/2019     PCP: Haywood Pao, MD   Outpatient Specialists:   CARDS:  Dr. Burt Knack    Patient arrived to ER on 09/27/19 at 2141  Patient coming from: home Lives  With family    Chief Complaint:   Chief Complaint  Patient presents with   Transient Ischemic Attack    HPI: [name] [name] is a 85 y.o. male with medical history significant of chronic AFib not on anticoagulation, T2DM, stage III CKD, CVA with left side weakness, chronic HFpEF, COPD, and HTN, dementia  chronic diastolic heart failure, OSA not on CPAP  Presented with sudden tremor of upper extremity and fixated left gaze for around 10 seconds He could not raise left arm at first No neuro deficits and upon EMS arrival Patient has history of CVA with chronic left-sided weakness from prior stroke On arrival blood sugar was noted to be in 370s which is unusual for him blood pressure 118/88 satting 94% room air Patient was altered for about 3-4 minutes after the event No history of seizures activities in the past Patient unable to provide his own history son at bedside Provided history to emergency department physician  Admitted in March after a fall, had an MRI at that time showed no acute findings   Infectious risk factors:  Reports none   In  ER RAPID COVID TEST NEGATIVE   Lab Results  Component Value Date   [place] NEGATIVE 09/27/2019     Regarding pertinent Chronic problems:     Hyperlipidemia -  on Lipitor   HTN on Toprolol   CHF diastolic  - last echo [id number] left ventricle has normal systolic function ejection fraction of 55-60%.     DM 2 -  Lab Results  Component Value Date   HGBA1C 7.0 (H) 10/03/2017   PO meds only       COPD - not  followed by pulmonology not on baseline oxygen currently but not for the past 2 years      OSA - not on  CPAP   Hx of CVA -  With  residual deficits on Aspirin  , 325,    A. Fib -  -  CHA2DS2 vas score 5 :     Not on anticoagulation secondary to Risk of Falls,         -  Rate control:  Currently controlled with Toprolol     CKD stage III - baseline Cr 1.5 Lab Results  Component Value Date   CREATININE 1.30 (H) 09/27/2019   CREATININE 1.50 (H) 09/27/2019   CREATININE 1.65 (H) 02/26/2019    BPH - on   Proscar  While in ER: On emergency department noted to be hypoxic requiring up to 4 L of oxygen slightly soft blood pressures. CT of the chest was done showing saddle PE Troponin 32 The following Work up has been ordered so far:  Orders Placed This Encounter  Procedures   SARS Coronavirus 2 [facility] order, Performed in [facility] hospital lab) Nasopharyngeal Nasopharyngeal Swab   CT Head Wo Contrast   DG Chest Port 1 View   CT Angio Chest PE W and/or Wo Contrast   CBC with Differential/Platelet   Comprehensive metabolic panel   Magnesium   Urinalysis, Routine w reflex microscopic   Brain natriuretic peptide   Consult to hospitalist  ALL PATIENTS BEING ADMITTED/HAVING PROCEDURES NEED COVID-19 SCREENING   heparin per pharmacy consult   I-stat  chem 8, ED (not at [facility] or [facility] [facility])   EKG 12-Lead     Following Medications were ordered in ER: Medications  sodium chloride 0.9 % bolus 500 mL (500 mLs Intravenous New Bag/Given 09/27/19 2233)  iohexol (OMNIPAQUE) 350 MG/ML injection 80 mL (80 mLs Intravenous Contrast Given 09/27/19 2257)        Consult Orders  (From admission, onward)         Start     Ordered   09/27/19 2323  Consult to hospitalist  ALL PATIENTS BEING ADMITTED/HAVING PROCEDURES NEED COVID-19 SCREENING [facility]  Once    Comments: ALL PATIENTS BEING ADMITTED/HAVING PROCEDURES NEED COVID-19 SCREENING  Provider:  (Not yet assigned)  Question Answer Comment  Place call to: [facility]   Reason for Consult Admit      09/27/19 2322          Significant initial  Findings: Abnormal Labs Reviewed  CBC WITH  DIFFERENTIAL/PLATELET - Abnormal; Notable for the following components:      Result Value   WBC 15.4 (*)    RBC 4.09 (*)    Neutro Abs 13.2 (*)    Abs Immature Granulocytes 0.08 (*)    All other components within normal limits  COMPREHENSIVE METABOLIC PANEL - Abnormal; Notable for the following components:   CO2 20 (*)    Glucose, Bld 351 (*)    Creatinine, Ser 1.50 (*)    Calcium 8.8 (*)    Total Protein 6.4 (*)    Albumin 3.4 (*)    Total Bilirubin 1.7 (*)    GFR calc non Af Amer 40 (*)    GFR calc Af Amer 47 (*)    All other components within normal limits  BRAIN NATRIURETIC PEPTIDE - Abnormal; Notable for the following components:   B Natriuretic Peptide 154.0 (*)    All other components within normal limits  I-STAT CHEM 8, ED - Abnormal; Notable for the following components:   BUN 28 (*)    Creatinine, Ser 1.30 (*)    Glucose, Bld 350 (*)    All other components within normal limits  TROPONIN I (HIGH SENSITIVITY) - Abnormal; Notable for the following components:   Troponin I (High Sensitivity) 32 (*)    All other components within normal limits    Otherwise labs showing:    Recent Labs  Lab 09/27/19 2211 09/27/19 2219  NA 135 137  K 4.9 4.8  CO2 20*  --   GLUCOSE 351* 350*  BUN 21 28*  CREATININE 1.50* 1.30*  CALCIUM 8.8*  --   MG 2.0  --     Cr   stable,   Lab Results  Component Value Date   CREATININE 1.30 (H) 09/27/2019   CREATININE 1.50 (H) 09/27/2019   CREATININE 1.65 (H) 02/26/2019    Recent Labs  Lab 09/27/19 2211  AST 33  ALT 22  ALKPHOS 101  BILITOT 1.7*  PROT 6.4*  ALBUMIN 3.4*   Lab Results  Component Value Date   CALCIUM 8.8 (L) 09/27/2019   PHOS 2.8 10/03/2017     WBC      Component Value Date/Time   WBC 15.4 (H) 09/27/2019 2211   ANC    Component Value Date/Time   NEUTROABS 13.2 (H) 09/27/2019 2211   ALC No components found for: LYMPHAB    Plt: Lab Results  Component Value Date   PLT 192 09/27/2019      Lactic Acid, Venous    Component Value Date/Time  LATICACIDVEN 1.3 10/03/2017 0259      COVID-19 Labs  No results for input(s): DDIMER, FERRITIN, LDH, CRP in the last 72 hours.  Lab Results  Component Value Date   SARSCOV2NAA NEGATIVE 09/27/2019     HG/HCT stable     Component Value Date/Time   HGB 13.6 09/27/2019 2219   HCT 40.0 09/27/2019 2219   HCT 43.9 01/01/2017 1429     Troponin 32      ECG: Ordered Personally reviewed by me showing: HR : 103 Rhythm:  Sinus tachycardia  RBBB   no evidence of ischemic changes QTC 486   BNP (last 3 results) Recent Labs    02/25/19 0612 09/27/19 2212  BNP 153.5* 154.0*      UA   ordered        Ordered  CT HEAD  NON acute  CXR -  NON acute    CTA chest -   PE,   Positive for acute pulmonary emboli with saddle pulmonary embolus and bilateral lobar and segmental pulmonary emboli involving all lobes no evidence of infiltrate      ED Triage Vitals  Enc Vitals Group     BP 09/27/19 2150 99/75     Pulse Rate 09/27/19 2150 100     Resp 09/27/19 2150 20     Temp 09/27/19 2153 98.3 F (36.8 C)     Temp Source 09/27/19 2153 Oral     SpO2 09/27/19 2150 (!) 89 %     Weight 09/27/19 2146 160 lb (72.6 kg)     Height 09/27/19 2146 5\' 8"  (1.727 m)     Head Circumference --      Peak Flow --      Pain Score 09/27/19 2145 6     Pain Loc --      Pain Edu? --      Excl. in [place]? --   TMAX(24)@       Latest  Blood pressure 124/82, pulse 100, temperature 98.3 F (36.8 C), temperature source Oral, resp. rate 18, height 5\' 8"  (1.727 m), weight 72.6 kg, SpO2 95 %.     Hospitalist was called for admission for saddlePE   Review of Systems:    Pertinent positives include: fatigue,   Constitutional:  No weight loss, night sweats, Fevers, chills, weight loss  HEENT:  No headaches, Difficulty swallowing,Tooth/dental problems,Sore throat,  No sneezing, itching, ear ache, nasal congestion, post nasal drip,   Cardio-vascular:  No chest pain, Orthopnea, PND, anasarca, dizziness, palpitations.no Bilateral lower extremity swelling  GI:  No heartburn, indigestion, abdominal pain, nausea, vomiting, diarrhea, change in bowel habits, loss of appetite, melena, blood in stool, hematemesis Resp:  no shortness of breath at rest. No dyspnea on exertion, No excess mucus, no productive cough, No non-productive cough, No coughing up of blood.No change in color of mucus.No wheezing. Skin:  no rash or lesions. No jaundice GU:  no dysuria, change in color of urine, no urgency or frequency. No straining to urinate.  No flank pain.  Musculoskeletal:  No joint pain or no joint swelling. No decreased range of motion. No back pain.  Psych:  No change in mood or affect. No depression or anxiety. No memory loss.  Neuro: no localizing neurological complaints, no tingling, no weakness, no double vision, no gait abnormality, no slurred speech, no confusion  All systems reviewed and apart from [place] all are negative  Past Medical History:   Past Medical History:  Diagnosis Date   Arthritis  Chronic atrial fibrillation (HCC)    Diabetes mellitus without complication (HCC)    Hyperlipidemia    Hypertension    Sleep apnea    no cpap used, sleep study was several yrs ago   Urinary retention foley last changed 3 weeks ago      Past Surgical History:  Procedure Laterality Date   BACK SURGERY  yrs ago   lower back surgery   PROSTATE SURGERY  yrs ago, no cancer   TONSILLECTOMY  as child   TRANSURETHRAL RESECTION OF PROSTATE  02/22/2012   Procedure: TRANSURETHRAL RESECTION OF THE PROSTATE WITH GYRUS INSTRUMENTS;  Surgeon: Molli Hazard, MD;  Location: [facility];  Service: Urology;  Laterality: N/A;        Social History:  Ambulatory   walker        reports that he quit smoking about 38 years ago. His smoking use included cigarettes. He has a 25.00 pack-year smoking history. He has never used  smokeless tobacco. He reports current alcohol use. He reports that he does not use drugs.     Family History:   Family History  Problem Relation Age of Onset   Heart attack Father     Allergies: Allergies  Allergen Reactions   Sulfa Antibiotics Swelling    Swelling of hands     Prior to Admission medications   Medication Sig Start Date End Date Taking? Authorizing Provider  aspirin 325 MG tablet Take 1 tablet (325 mg total) by mouth daily. Patient not taking: Reported on 02/25/2019 01/05/17   Florencia Reasons, MD  atorvastatin (LIPITOR) 20 MG tablet Take 1 tablet (20 mg total) by mouth daily at 6 PM. 01/04/17   Florencia Reasons, MD  CALCIUM PO Take 0.5 tablets by mouth daily.    [provider]  donepezil (ARICEPT) 10 MG tablet Take 10 mg by mouth at bedtime.  01/09/16   [provider]  finasteride (PROSCAR) 5 MG tablet Take 5 mg by mouth at bedtime.     [provider]  glimepiride (AMARYL) 2 MG tablet Take 2 mg by mouth at bedtime.  12/16/18   [provider]  Melatonin 10 MG TABS Take 10 mg by mouth at bedtime.    [provider]  metoprolol succinate (TOPROL-XL) 50 MG 24 hr tablet Take 1 tablet (50 mg total) by mouth at bedtime. Patient is overdue for an appointment and needs to schedule for further refills 2nd attempt 10/01/17   Sherren Mocha, MD   Physical Exam: Blood pressure 124/82, pulse 100, temperature 98.3 F (36.8 C), temperature source Oral, resp. rate 18, height 5\' 8"  (1.727 m), weight 72.6 kg, SpO2 95 %. 1. General:  in No  Acute distress    Chronically ill  -appearing 2. Psychological: Alert and   Oriented 3. Head/ENT:    Dry Mucous Membranes                          Head Non traumatic, neck supple                            Poor Dentition 4. SKIN:  decreased Skin turgor,  Skin clean Dry and intact no rash, levido on LE 5. Heart: Regular rate and rhythm no Murmur, no Rub or gallop 6. Lungs:   no wheezes or crackles   7. Abdomen:  Soft,  non-tender, Non distended  bowel sounds present 8. Lower extremities:  no clubbing, cyanosis, no edema 9. Neurologically  strength diminished on the left cranial nerves II through XII intact 10. MSK: Normal range of motion   All other LABS:     Recent Labs  Lab 09/27/19 2211 09/27/19 2219  WBC 15.4*  --   NEUTROABS 13.2*  --   HGB 13.2 13.6  HCT 39.7 40.0  MCV 97.1  --   PLT 192  --      Recent Labs  Lab 09/27/19 2211 09/27/19 2219  NA 135 137  K 4.9 4.8  CL 104 104  CO2 20*  --   GLUCOSE 351* 350*  BUN 21 28*  CREATININE 1.50* 1.30*  CALCIUM 8.8*  --   MG 2.0  --      Recent Labs  Lab 09/27/19 2211  AST 33  ALT 22  ALKPHOS 101  BILITOT 1.7*  PROT 6.4*  ALBUMIN 3.4*       Cultures:    Component Value Date/Time   SDES URINE, RANDOM 10/03/2017 1100   SPECREQUEST NONE 10/03/2017 1100   CULT NO GROWTH 10/03/2017 1100   REPTSTATUS 10/04/2017 FINAL 10/03/2017 1100     Radiological Exams on Admission: Ct Head Wo Contrast  Result Date: 09/27/2019 CLINICAL DATA:  Altered level of consciousness EXAM: CT HEAD WITHOUT CONTRAST TECHNIQUE: Contiguous axial images were obtained from the base of the skull through the vertex without intravenous contrast. COMPARISON:  February 25, 2019 FINDINGS: Brain: No evidence of acute territorial infarction, hemorrhage, hydrocephalus,extra-axial collection or mass lesion/mass effect. There is dilatation the ventricles and sulci consistent with age-related atrophy. Low-attenuation changes in the deep white matter consistent with small vessel ischemia. Vascular: No hyperdense vessel or unexpected calcification. Skull: The skull is intact. No fracture or focal lesion identified. Sinuses/Orbits: The visualized paranasal sinuses and mastoid air cells are clear. The orbits and globes intact. Other: None IMPRESSION: No acute intracranial abnormality. Findings consistent with age related atrophy and chronic small vessel ischemia  Electronically Signed   By: Prudencio Pair M.D.   On: 09/27/2019 22:28   Ct Angio Chest Pe W And/or Wo Contrast  Result Date: 09/27/2019 CLINICAL DATA:  Sudden onset upper extremity tremors and fixed left-sided gaze for 10 seconds. EXAM: CT ANGIOGRAPHY CHEST WITH CONTRAST TECHNIQUE: Multidetector CT imaging of the chest was performed using the standard protocol during bolus administration of intravenous contrast. Multiplanar CT image reconstructions and MIPs were obtained to evaluate the vascular anatomy. CONTRAST:  87mL OMNIPAQUE IOHEXOL 350 MG/ML SOLN COMPARISON:  Chest x-ray from same day. CT chest dated February 28, 2016. FINDINGS: Cardiovascular: Satisfactory opacification of the pulmonary arteries to the segmental level. Positive for acute pulmonary emboli with saddle pulmonary embolus and bilateral lobar and segmental pulmonary emboli involving all lobes. Elevated RV/LV ratio of 2.7 normal heart size. No pericardial effusion. No thoracic aortic aneurysm. Coronary, aortic arch, and branch vessel atherosclerotic vascular disease. Mediastinum/Nodes: No enlarged mediastinal, hilar, or axillary lymph nodes. Thyroid gland, trachea, and esophagus demonstrate no significant findings. Lungs/Pleura: Scattered parenchymal scarring corresponding to sites of prior infection/inflammation seen on prior chest CT from 2017. Mild mosaic perfusion. No focal consolidation, pleural effusion, or pneumothorax. Left lower lobe peribronchial thickening. No suspicious pulmonary nodule. Upper Abdomen: No acute abnormality. Musculoskeletal: No chest wall abnormality. No acute or significant osseous findings. T2 and T8 vertebral body hemangiomas. Review of the MIP images confirms the above findings. IMPRESSION: 1. Positive for acute PE with CT evidence of right heart strain (RV/LV Ratio = 2.7) consistent with at  least submassive (intermediate risk) PE. The presence of right heart strain has been associated with an increased risk of  morbidity and mortality. Please activate Code PE by paging [phone]. 2.  Aortic atherosclerosis (ICD10-I70.0). Critical Value/emergent results were called by telephone at the time of interpretation on 09/27/2019 at 11:26 pm to providerDAVID YAO , who verbally acknowledged these results. Electronically Signed   By: Titus Dubin M.D.   On: 09/27/2019 23:28   Dg Chest Port 1 View  Result Date: 09/27/2019 CLINICAL DATA:  Cough EXAM: PORTABLE CHEST 1 VIEW COMPARISON:  February 25, 2019 FINDINGS: Unchanged cardiomediastinal silhouette. Aortic knob calcifications. Both lungs are clear. The visualized skeletal structures are unremarkable. IMPRESSION: No acute cardiopulmonary process. Electronically Signed   By: Prudencio Pair M.D.   On: 09/27/2019 22:13    Chart has been reviewed    Assessment/Plan  85 y.o. male with medical history significant of chronic AFib not on anticoagulation, T2DM, stage III CKD, CVA with left side weakness, chronic HFpEF, COPD, and HTN, dementia  chronic diastolic heart failure, OSA not on CPAP Admitted for saddle PE and seizure like activity  Present on Admission:  Pulmonary embolism ([place]) -  Admit to   stepdown Initiate heparin drip  Would likely benefit from case manager consult for long term anticoagulation Hold home blood pressure medications avoid hypotension Cycle cardiac enzymes Order echogram and lower extremity Dopplers  Most likely risk factors for hypercoagulable state being  sedentary lifestyle     Given hypotension and massive PE [facility] consulted   per PCCM patient not a candidate for tPA Will see in AM    Hyperglycemia -order sliding scale and monitor blood sugars   Hyperlipidemia -chronic stable continue home medications   HYPERTENSION, BENIGN -on hold for tonight allow permissive hypertension   Atrial fibrillation, chronic -          - CHA2DS2 vas score 5 : Previously not on anticoagulation for right now will need to remain on anticoagulation  given massive PE         -  Rate control:  Currently controlled with  Toprolol on hold for tonight     GERD -chronic stable   COPD (chronic obstructive pulmonary disease) with chronic bronchitis (HCC) chronic stable   Dementia without behavioral disturbance ([place]) -chronic continue to monitor expect some degree of sundowning while hospitalized   CKD (chronic kidney disease), stage III - chronic avoid nephrotoxic medications currently improved creatinine could be in the setting of diminished muscle mass   Chronic diastolic CHF (congestive heart failure) ([place]) -gently hydrate in the setting of massive PE avoid fluid overload   Elevated troponin current recurrent also in the setting of massive PE continue to monitor on telemetry  Seizure-like activity -in the setting of saddle PE and syncope, Order EEG, discussed with neurology they recommended if MRI, echo, EEG if abnormal please re consult Given transient worsening left-sided weakness will obtain MRI of brain   Other plan as per orders.  DVT prophylaxis:  heparin   Code Status:    DNR/DNI  as per  family  I had personally discussed CODE STATUS with patient and family  I had spent 15 min discussing goals of care and CODE STATUS  Family Communication:   Family  at  Bedside  plan of care was discussed on the phone with   Son,   Disposition Plan:     To home once workup is complete and patient is stable  Swallow eval - SLP ordered                                      Consults called:    PCCM,  Please re consult neurology if abnormal studies  Admission status:  ED Disposition    ED Disposition Condition Comment   Admit  The patient appears reasonably stabilized for admission considering the current resources, flow, and capabilities available in the ED at this time, and I doubt any other [facility] requiring further screening and/or treatment in the ED prior to admission is  present.           inpatient     Expect 2 midnight stay secondary to severity of patient's current illness including   hemodynamic instability despite optimal treatment (tachycardia  Hypotension hypoxia, )  Severe lab/radiological/exam abnormalities including:   Saddle PE   and extensive comorbidities including:  DM2   CHF  COPD/asthma  CKD  dementia  history of stroke with residual deficits   That are currently affecting medical management.   I expect  patient to be hospitalized for 2 midnights requiring inpatient medical care.  Patient is at high risk for adverse outcome (such as loss of life or disability) if not treated.  Indication for inpatient stay as follows:    Hemodynamic instability despite maximal medical therapy,    New or worsening hypoxia  Need for  IV fluids, IV anticoagulation       Level of care     SDU tele indefinitely please discontinue once patient no longer qualifies  Precautions:  No active isolations  PPE: Used by the provider:   P100  eye Goggles,  Gloves      Lovelyn Sheeran 09/28/2019, 1:11 AM    [facility]     after 2 AM please page floor coverage PA If 7AM-7PM, please contact the day team taking care of the patient using [email or web address]

## 2019-09-28 ENCOUNTER — Inpatient Hospital Stay (HOSPITAL_COMMUNITY): Payer: Medicare Other

## 2019-09-28 DIAGNOSIS — R0602 Shortness of breath: Secondary | ICD-10-CM | POA: Diagnosis present

## 2019-09-28 DIAGNOSIS — I2692 Saddle embolus of pulmonary artery without acute cor pulmonale: Secondary | ICD-10-CM | POA: Diagnosis present

## 2019-09-28 DIAGNOSIS — G4733 Obstructive sleep apnea (adult) (pediatric): Secondary | ICD-10-CM | POA: Diagnosis present

## 2019-09-28 DIAGNOSIS — E1165 Type 2 diabetes mellitus with hyperglycemia: Secondary | ICD-10-CM | POA: Diagnosis present

## 2019-09-28 DIAGNOSIS — K219 Gastro-esophageal reflux disease without esophagitis: Secondary | ICD-10-CM | POA: Diagnosis present

## 2019-09-28 DIAGNOSIS — N183 Chronic kidney disease, stage 3 unspecified: Secondary | ICD-10-CM | POA: Diagnosis present

## 2019-09-28 DIAGNOSIS — E785 Hyperlipidemia, unspecified: Secondary | ICD-10-CM | POA: Diagnosis present

## 2019-09-28 DIAGNOSIS — G40909 Epilepsy, unspecified, not intractable, without status epilepticus: Secondary | ICD-10-CM

## 2019-09-28 DIAGNOSIS — Z66 Do not resuscitate: Secondary | ICD-10-CM | POA: Diagnosis present

## 2019-09-28 DIAGNOSIS — M1990 Unspecified osteoarthritis, unspecified site: Secondary | ICD-10-CM | POA: Diagnosis present

## 2019-09-28 DIAGNOSIS — I482 Chronic atrial fibrillation, unspecified: Secondary | ICD-10-CM | POA: Diagnosis present

## 2019-09-28 DIAGNOSIS — I351 Nonrheumatic aortic (valve) insufficiency: Secondary | ICD-10-CM | POA: Diagnosis not present

## 2019-09-28 DIAGNOSIS — Z7984 Long term (current) use of oral hypoglycemic drugs: Secondary | ICD-10-CM | POA: Diagnosis not present

## 2019-09-28 DIAGNOSIS — I361 Nonrheumatic tricuspid (valve) insufficiency: Secondary | ICD-10-CM | POA: Diagnosis not present

## 2019-09-28 DIAGNOSIS — I2699 Other pulmonary embolism without acute cor pulmonale: Secondary | ICD-10-CM | POA: Diagnosis present

## 2019-09-28 DIAGNOSIS — Z20828 Contact with and (suspected) exposure to other viral communicable diseases: Secondary | ICD-10-CM | POA: Diagnosis present

## 2019-09-28 DIAGNOSIS — Z9119 Patient's noncompliance with other medical treatment and regimen: Secondary | ICD-10-CM | POA: Diagnosis not present

## 2019-09-28 DIAGNOSIS — R296 Repeated falls: Secondary | ICD-10-CM | POA: Diagnosis present

## 2019-09-28 DIAGNOSIS — Z8249 Family history of ischemic heart disease and other diseases of the circulatory system: Secondary | ICD-10-CM | POA: Diagnosis not present

## 2019-09-28 DIAGNOSIS — J449 Chronic obstructive pulmonary disease, unspecified: Secondary | ICD-10-CM | POA: Diagnosis present

## 2019-09-28 DIAGNOSIS — R251 Tremor, unspecified: Secondary | ICD-10-CM | POA: Diagnosis present

## 2019-09-28 DIAGNOSIS — R569 Unspecified convulsions: Secondary | ICD-10-CM | POA: Diagnosis not present

## 2019-09-28 DIAGNOSIS — I2602 Saddle embolus of pulmonary artery with acute cor pulmonale: Secondary | ICD-10-CM | POA: Diagnosis not present

## 2019-09-28 DIAGNOSIS — I248 Other forms of acute ischemic heart disease: Secondary | ICD-10-CM | POA: Diagnosis present

## 2019-09-28 DIAGNOSIS — I5032 Chronic diastolic (congestive) heart failure: Secondary | ICD-10-CM | POA: Diagnosis present

## 2019-09-28 DIAGNOSIS — F0390 Unspecified dementia without behavioral disturbance: Secondary | ICD-10-CM | POA: Diagnosis present

## 2019-09-28 DIAGNOSIS — Z7982 Long term (current) use of aspirin: Secondary | ICD-10-CM | POA: Diagnosis not present

## 2019-09-28 DIAGNOSIS — E1122 Type 2 diabetes mellitus with diabetic chronic kidney disease: Secondary | ICD-10-CM | POA: Diagnosis present

## 2019-09-28 DIAGNOSIS — I130 Hypertensive heart and chronic kidney disease with heart failure and stage 1 through stage 4 chronic kidney disease, or unspecified chronic kidney disease: Secondary | ICD-10-CM | POA: Diagnosis present

## 2019-09-28 DIAGNOSIS — I69354 Hemiplegia and hemiparesis following cerebral infarction affecting left non-dominant side: Secondary | ICD-10-CM | POA: Diagnosis not present

## 2019-09-28 DIAGNOSIS — Z882 Allergy status to sulfonamides status: Secondary | ICD-10-CM | POA: Diagnosis not present

## 2019-09-28 HISTORY — DX: Other pulmonary embolism without acute cor pulmonale: I26.99

## 2019-09-28 LAB — COMPREHENSIVE METABOLIC PANEL
ALT: 22 U/L (ref 0–44)
AST: 17 U/L (ref 15–41)
Albumin: 3.1 g/dL — ABNORMAL LOW (ref 3.5–5.0)
Alkaline Phosphatase: 98 U/L (ref 38–126)
Anion gap: 10 (ref 5–15)
BUN: 18 mg/dL (ref 8–23)
CO2: 24 mmol/L (ref 22–32)
Calcium: 8.9 mg/dL (ref 8.9–10.3)
Chloride: 102 mmol/L (ref 98–111)
Creatinine, Ser: 1.4 mg/dL — ABNORMAL HIGH (ref 0.61–1.24)
GFR calc Af Amer: 51 mL/min — ABNORMAL LOW (ref >60)
GFR calc non Af Amer: 44 mL/min — ABNORMAL LOW (ref >60)
Glucose, Bld: 284 mg/dL — ABNORMAL HIGH (ref 70–99)
Potassium: 3.9 mmol/L (ref 3.5–5.1)
Sodium: 136 mmol/L (ref 135–145)
Total Bilirubin: 0.9 mg/dL (ref 0.3–1.2)
Total Protein: 6.4 g/dL — ABNORMAL LOW (ref 6.5–8.1)

## 2019-09-28 LAB — HEMOGLOBIN A1C
Hgb A1c MFr Bld: 8.9 % — ABNORMAL HIGH (ref 4.8–5.6)
Mean Plasma Glucose: 208.73 mg/dL

## 2019-09-28 LAB — URINALYSIS, ROUTINE W REFLEX MICROSCOPIC
Bilirubin Urine: NEGATIVE
Glucose, UA: >=500 mg/dL — AB
Hgb urine dipstick: NEGATIVE
Ketones, ur: 5 mg/dL — AB
Nitrite: NEGATIVE
Protein, ur: NEGATIVE mg/dL
Specific Gravity, Urine: 1.029 (ref 1.005–1.030)
WBC, UA: >50 WBC/hpf — ABNORMAL HIGH (ref 0–5)
pH: 5 (ref 5.0–8.0)

## 2019-09-28 LAB — HEPARIN LEVEL (UNFRACTIONATED)
Heparin Unfractionated: 0.72 IU/mL — ABNORMAL HIGH (ref 0.30–0.70)
Heparin Unfractionated: 0.72 IU/mL — ABNORMAL HIGH (ref 0.30–0.70)

## 2019-09-28 LAB — TROPONIN I (HIGH SENSITIVITY)
Troponin I (High Sensitivity): 94 ng/L — ABNORMAL HIGH (ref <18)
Troponin I (High Sensitivity): 139 ng/L (ref <18)
Troponin I (High Sensitivity): 125 ng/L (ref <18)

## 2019-09-28 LAB — CBC
HCT: 39.1 % (ref 39.0–52.0)
Hemoglobin: 12.8 g/dL — ABNORMAL LOW (ref 13.0–17.0)
MCH: 32.2 pg (ref 26.0–34.0)
MCHC: 32.7 g/dL (ref 30.0–36.0)
MCV: 98.2 fL (ref 80.0–100.0)
Platelets: 164 10*3/uL (ref 150–400)
RBC: 3.98 MIL/uL — ABNORMAL LOW (ref 4.22–5.81)
RDW: 13.6 % (ref 11.5–15.5)
WBC: 11.3 10*3/uL — ABNORMAL HIGH (ref 4.0–10.5)
nRBC: 0 % (ref 0.0–0.2)

## 2019-09-28 LAB — MAGNESIUM: Magnesium: 2.1 mg/dL (ref 1.7–2.4)

## 2019-09-28 LAB — CBG MONITORING, ED
Glucose-Capillary: 269 mg/dL — ABNORMAL HIGH (ref 70–99)
Glucose-Capillary: 159 mg/dL — ABNORMAL HIGH (ref 70–99)
Glucose-Capillary: 200 mg/dL — ABNORMAL HIGH (ref 70–99)
Glucose-Capillary: 166 mg/dL — ABNORMAL HIGH (ref 70–99)
Glucose-Capillary: 184 mg/dL — ABNORMAL HIGH (ref 70–99)

## 2019-09-28 LAB — TSH: TSH: 2.066 u[IU]/mL (ref 0.350–4.500)

## 2019-09-28 LAB — ECHOCARDIOGRAM COMPLETE
Height: 68 in
Weight: 2560 oz

## 2019-09-28 LAB — PHOSPHORUS: Phosphorus: 3.5 mg/dL (ref 2.5–4.6)

## 2019-09-28 MED ORDER — SODIUM CHLORIDE 0.9 % IV SOLN
INTRAVENOUS | Status: DC
  Administered 2019-09-28: 04:00:00 via INTRAVENOUS

## 2019-09-28 MED ORDER — INSULIN ASPART 100 UNIT/ML ~~LOC~~ SOLN
0.0000 [IU] | SUBCUTANEOUS | Status: DC
  Administered 2019-09-28: 5 [IU] via SUBCUTANEOUS
  Administered 2019-09-28: 2 [IU] via SUBCUTANEOUS

## 2019-09-28 MED ORDER — HYDROCODONE-ACETAMINOPHEN 5-325 MG PO TABS
1.0000 | ORAL_TABLET | ORAL | Status: DC | PRN
  Administered 2019-10-04: 2 via ORAL
  Filled 2019-09-28: qty 2

## 2019-09-28 MED ORDER — INSULIN GLARGINE 100 UNIT/ML ~~LOC~~ SOLN
20.0000 [IU] | Freq: Every day | SUBCUTANEOUS | Status: DC
  Administered 2019-09-28 – 2019-10-06 (×9): 20 [IU] via SUBCUTANEOUS
  Filled 2019-09-28 (×10): qty 0.2

## 2019-09-28 MED ORDER — ACETAMINOPHEN 650 MG RE SUPP: 650.0000 mg | Freq: Four times a day (QID) | RECTAL | Status: DC | PRN

## 2019-09-28 MED ORDER — ONDANSETRON HCL 4 MG/2ML IJ SOLN: 4.0000 mg | Freq: Four times a day (QID) | INTRAMUSCULAR | Status: DC | PRN

## 2019-09-28 MED ORDER — ACETAMINOPHEN 325 MG PO TABS: 650.0000 mg | ORAL_TABLET | Freq: Four times a day (QID) | ORAL | Status: DC | PRN

## 2019-09-28 MED ORDER — ONDANSETRON HCL 4 MG PO TABS: 4.0000 mg | ORAL_TABLET | Freq: Four times a day (QID) | ORAL | Status: DC | PRN

## 2019-09-28 MED ORDER — PERFLUTREN LIPID MICROSPHERE
1.0000 mL | INTRAVENOUS | Status: AC | PRN
  Administered 2019-09-28: 3 mL via INTRAVENOUS
  Filled 2019-09-28: qty 10

## 2019-09-28 MED ORDER — DONEPEZIL HCL 5 MG PO TABS
10.0000 mg | ORAL_TABLET | Freq: Every day | ORAL | Status: DC
  Administered 2019-09-28 – 2019-10-05 (×8): 10 mg via ORAL
  Filled 2019-09-28 (×2): qty 2
  Filled 2019-09-28: qty 1
  Filled 2019-09-28 (×4): qty 2
  Filled 2019-09-28: qty 1
  Filled 2019-09-28 (×3): qty 2

## 2019-09-28 MED ORDER — INSULIN ASPART 100 UNIT/ML ~~LOC~~ SOLN
0.0000 [IU] | Freq: Three times a day (TID) | SUBCUTANEOUS | Status: DC
  Administered 2019-09-28 – 2019-09-29 (×4): 2 [IU] via SUBCUTANEOUS
  Administered 2019-09-29: 1 [IU] via SUBCUTANEOUS
  Administered 2019-09-30: 2 [IU] via SUBCUTANEOUS
  Administered 2019-09-30: 3 [IU] via SUBCUTANEOUS
  Administered 2019-09-30: 1 [IU] via SUBCUTANEOUS
  Administered 2019-10-01 (×2): 2 [IU] via SUBCUTANEOUS
  Administered 2019-10-01: 1 [IU] via SUBCUTANEOUS
  Administered 2019-10-02: 13:00:00 3 [IU] via SUBCUTANEOUS
  Administered 2019-10-02: 1 [IU] via SUBCUTANEOUS
  Administered 2019-10-02: 3 [IU] via SUBCUTANEOUS
  Administered 2019-10-03: 1 [IU] via SUBCUTANEOUS
  Administered 2019-10-03: 3 [IU] via SUBCUTANEOUS
  Administered 2019-10-03: 2 [IU] via SUBCUTANEOUS
  Administered 2019-10-04: 3 [IU] via SUBCUTANEOUS
  Administered 2019-10-04: 1 [IU] via SUBCUTANEOUS
  Administered 2019-10-04: 3 [IU] via SUBCUTANEOUS
  Administered 2019-10-05: 2 [IU] via SUBCUTANEOUS
  Administered 2019-10-05: 5 [IU] via SUBCUTANEOUS
  Administered 2019-10-05 – 2019-10-06 (×2): 1 [IU] via SUBCUTANEOUS
  Administered 2019-10-06: 2 [IU] via SUBCUTANEOUS

## 2019-09-28 MED ORDER — ATORVASTATIN CALCIUM 10 MG PO TABS
20.0000 mg | ORAL_TABLET | Freq: Every day | ORAL | Status: DC
  Administered 2019-09-28 – 2019-10-05 (×8): 20 mg via ORAL
  Filled 2019-09-28 (×8): qty 2

## 2019-09-28 MED ORDER — SODIUM CHLORIDE 0.9 % IV SOLN
1.0000 g | INTRAVENOUS | Status: DC
  Administered 2019-09-28 – 2019-09-29 (×2): 1 g via INTRAVENOUS
  Filled 2019-09-28 (×2): qty 10
  Filled 2019-09-28: qty 1

## 2019-09-28 NOTE — ED Notes (Signed)
CBG=[id number]  

## 2019-09-28 NOTE — ED Notes (Signed)
PAGED [facility] TO RN MELANIE--Zira Helinski

## 2019-09-28 NOTE — Progress Notes (Signed)
Echocardiogram 2D Echocardiogram has been performed.  [place] Deputy Samanda Buske 09/28/2019, 1:45 PM

## 2019-09-28 NOTE — ED Notes (Addendum)
CBG 166 

## 2019-09-28 NOTE — Progress Notes (Signed)
PROGRESS NOTE    WRYDER KEIZER  [id number] DOB: 02-Sep-1926 DOA: 09/27/2019 PCP: Haywood Pao, MD     Brief Narrative:  HIRAN HARNESS is a 85 year old male with past medical history significant for chronic atrial fibrillation not on anticoagulation, type 2 diabetes, chronic kidney disease stage III, history of CVA with chronic left-sided weakness, chronic diastolic heart failure, COPD, hypertension, dementia, OSA not on CPAP who presents with sudden tremor of upper extremity, fixed left gaze for around 10 seconds.  Upon EMS arrival, they did not note any neuro deficits.  Patient does have history of CVA with chronic left-sided weakness.  Patient had some altered mental status for about 3 to 4 minutes after the event, no history of seizures in the past.  In the emergency department, CTA chest revealed bilateral saddle PE.  He was started on IV heparin, critical care consulted.  New events last 24 hours / Subjective: Patient without any respiratory distress or respiratory complaints today.  Denies any chest pain or shortness of breath.  He remains on 2 L nasal cannula O2.  Assessment & Plan:   Principal Problem:   Pulmonary embolism (HCC) Active Problems:   Hyperlipidemia   HYPERTENSION, BENIGN   Atrial fibrillation, chronic   GERD   Hyperglycemia   Elevated troponin   COPD (chronic obstructive pulmonary disease) with chronic bronchitis (HCC)   Dementia without behavioral disturbance (HCC)   CKD (chronic kidney disease), stage III   Chronic diastolic CHF (congestive heart failure) ([place])   Acute bilateral saddle PE -PCCM consulted, did not recommend thrombolytic.  Signed off 10/8 -Continue IV heparin for 3 to 5 days, consider transition to oral anticoagulant at that time -Echocardiogram pending -Venous duplex Dopplers pending  Concern for seizure activity -Discussed with neurology at the time of admission, they recommended MRI, echo, EEG.  If abnormal, plan for formal  neurologic consultation.  Hold off on antiepileptics for now as patient has no history of seizures and this was not a definite seizure -MRI: motion degraded study without acute finding -Seizure precaution  Demand ischemia -Secondary to above, peaked at 139 and trended downward -Echocardiogram pending  Chronic atrial fibrillation -CHA2DS2-VASc score 5 -IV heparin  Hyperlipidemia -Continue Lipitor  Dementia -Appears to be stable for now -Continue Aricept  Chronic kidney disease stage III -Baseline creatinine 1.4-1.7 -Stable  Chronic diastolic heart failure -Euvolemic at this time.  BNP 154 -Holding Toprol, patient had episode of nonsustained bradycardia during my examination  Pyuria -Empiric Rocephin -Urine culture pending  Type 2 diabetes with hyperglycemia -Hold Amaryl -Lantus, sliding scale insulin    DVT prophylaxis: IV heparin Code Status: DNR Family Communication: Discussed with son over the phone Disposition Plan: Continue IV heparin for now   Consultants:   PCCM  Procedures:   None   Antimicrobials:  Anti-infectives (From admission, onward)   Start     Dose/Rate Route Frequency Ordered Stop   09/28/19 1000  cefTRIAXone (ROCEPHIN) 1 g in sodium chloride 0.9 % 100 mL IVPB     1 g 200 mL/hr over 30 Minutes Intravenous Every 24 hours 09/28/19 0839          Objective: Vitals:   09/28/19 0922 09/28/19 1025 09/28/19 1040 09/28/19 1045  BP: 105/78 98/78 96/71  (!) 110/57  Pulse: 87 (!) 124 81 74  Resp: 17 18 (!) 32 17  Temp:      TempSrc:      SpO2: 98% 97% 98% 97%  Weight:  Height:        Intake/Output Summary (Last 24 hours) at 09/28/2019 1115 Last data filed at 09/28/2019 1042 Gross per 24 hour  Intake 100 ml  Output --  Net 100 ml   Filed Weights   09/27/19 2146  Weight: 72.6 kg    Examination:  General exam: Appears calm and comfortable  Respiratory system: Clear to auscultation. Respiratory effort normal. No respiratory  distress. No conversational dyspnea. On 2L [place] O2  Cardiovascular system: S1 & S2 heard, RRR. No murmurs. No pedal edema. Gastrointestinal system: Abdomen is nondistended, soft and nontender. Normal bowel sounds heard. Central nervous system: Alert and oriented to self and place only. No focal deficits  Extremities: Symmetric in appearance  Psychiatry: Judgement and insight appear stable, hx of dementia    Data Reviewed: I have personally reviewed following labs and imaging studies  CBC: Recent Labs  Lab 09/27/19 2211 09/27/19 2219 09/28/19 0417  WBC 15.4*  --  11.3*  NEUTROABS 13.2*  --   --   HGB 13.2 13.6 12.8*  HCT 39.7 40.0 39.1  MCV 97.1  --  98.2  PLT 192  --  123456   Basic Metabolic Panel: Recent Labs  Lab 09/27/19 2211 09/27/19 2219 09/28/19 0417  NA 135 137 136  K 4.9 4.8 3.9  CL 104 104 102  CO2 20*  --  24  GLUCOSE 351* 350* 284*  BUN 21 28* 18  CREATININE 1.50* 1.30* 1.40*  CALCIUM 8.8*  --  8.9  MG 2.0  --  2.1  PHOS  --   --  3.5   GFR: Estimated Creatinine Clearance: 33.3 mL/min (A) (by C-G formula based on SCr of 1.4 mg/dL (H)). Liver Function Tests: Recent Labs  Lab 09/27/19 2211 09/28/19 0417  AST 33 17  ALT 22 22  ALKPHOS 101 98  BILITOT 1.7* 0.9  PROT 6.4* 6.4*  ALBUMIN 3.4* 3.1*   No results for input(s): LIPASE, AMYLASE in the last 168 hours. No results for input(s): AMMONIA in the last 168 hours. Coagulation Profile: No results for input(s): INR, PROTIME in the last 168 hours. Cardiac Enzymes: No results for input(s): CKTOTAL, CKMB, CKMBINDEX, TROPONINI in the last 168 hours. BNP (last 3 results) No results for input(s): PROBNP in the last 8760 hours. HbA1C: Recent Labs    09/28/19 0417  HGBA1C 8.9*   CBG: Recent Labs  Lab 09/28/19 0405 09/28/19 0806  GLUCAP 269* 159*   Lipid Profile: No results for input(s): CHOL, HDL, LDLCALC, TRIG, CHOLHDL, LDLDIRECT in the last 72 hours. Thyroid Function Tests: Recent Labs     09/28/19 0417  TSH 2.066   Anemia Panel: No results for input(s): VITAMINB12, FOLATE, FERRITIN, TIBC, IRON, RETICCTPCT in the last 72 hours. Sepsis Labs: No results for input(s): PROCALCITON, LATICACIDVEN in the last 168 hours.  Recent Results (from the past 240 hour(s))  SARS Coronavirus 2 [facility] order, Performed in [facility] hospital lab) Nasopharyngeal Nasopharyngeal Swab     Status: None   Collection Time: 09/27/19 10:05 PM   Specimen: Nasopharyngeal Swab  Result Value Ref Range Status   SARS Coronavirus 2 NEGATIVE NEGATIVE Final    Comment: (NOTE) If result is NEGATIVE SARS-CoV-2 target nucleic acids are NOT DETECTED. The SARS-CoV-2 RNA is generally detectable in upper and lower  respiratory specimens during the acute phase of infection. The lowest  concentration of SARS-CoV-2 viral copies this assay can detect is 250  copies / mL. A negative result does not preclude SARS-CoV-2  infection  and should not be used as the sole basis for treatment or other  patient management decisions.  A negative result may occur with  improper specimen collection / handling, submission of specimen other  than nasopharyngeal swab, presence of viral mutation(s) within the  areas targeted by this assay, and inadequate number of viral copies  (<250 copies / mL). A negative result must be combined with clinical  observations, patient history, and epidemiological information. If result is POSITIVE SARS-CoV-2 target nucleic acids are DETECTED. The SARS-CoV-2 RNA is generally detectable in upper and lower  respiratory specimens dur ing the acute phase of infection.  Positive  results are indicative of active infection with SARS-CoV-2.  Clinical  correlation with patient history and other diagnostic information is  necessary to determine patient infection status.  Positive results do  not rule out bacterial infection or co-infection with other viruses. If result is PRESUMPTIVE POSTIVE SARS-CoV-2  nucleic acids MAY BE PRESENT.   A presumptive positive result was obtained on the submitted specimen  and confirmed on repeat testing.  While 2019 novel coronavirus  (SARS-CoV-2) nucleic acids may be present in the submitted sample  additional confirmatory testing may be necessary for epidemiological  and / or clinical management purposes  to differentiate between  SARS-CoV-2 and other Sarbecovirus currently known to infect humans.  If clinically indicated additional testing with an alternate test  methodology [phone]) is advised. The SARS-CoV-2 RNA is generally  detectable in upper and lower respiratory sp ecimens during the acute  phase of infection. The expected result is Negative. Fact Sheet for Patients:  StrictlyIdeas.no Fact Sheet for Healthcare Providers: BankingDealers.co.za This test is not yet approved or cleared by the [place] FDA and has been authorized for detection and/or diagnosis of SARS-CoV-2 by FDA under an Emergency Use Authorization (EUA).  This EUA will remain in effect (meaning this test can be used) for the duration of the COVID-19 declaration under Section 564(b)(1) of the Act, 21 U.S.C. section 360bbb-3(b)(1), unless the authorization is terminated or revoked sooner. Performed at [facility], [place] [address] [place],  [address]       Radiology Studies: Ct Head Wo Contrast  Result Date: 09/27/2019 CLINICAL DATA:  Altered level of consciousness EXAM: CT HEAD WITHOUT CONTRAST TECHNIQUE: Contiguous axial images were obtained from the base of the skull through the vertex without intravenous contrast. COMPARISON:  February 25, 2019 FINDINGS: Brain: No evidence of acute territorial infarction, hemorrhage, hydrocephalus,extra-axial collection or mass lesion/mass effect. There is dilatation the ventricles and sulci consistent with age-related atrophy. Low-attenuation changes in the deep white matter  consistent with small vessel ischemia. Vascular: No hyperdense vessel or unexpected calcification. Skull: The skull is intact. No fracture or focal lesion identified. Sinuses/Orbits: The visualized paranasal sinuses and mastoid air cells are clear. The orbits and globes intact. Other: None IMPRESSION: No acute intracranial abnormality. Findings consistent with age related atrophy and chronic small vessel ischemia Electronically Signed   By: Prudencio Pair M.D.   On: 09/27/2019 22:28   Ct Angio Chest Pe W And/or Wo Contrast  Result Date: 09/27/2019 CLINICAL DATA:  Sudden onset upper extremity tremors and fixed left-sided gaze for 10 seconds. EXAM: CT ANGIOGRAPHY CHEST WITH CONTRAST TECHNIQUE: Multidetector CT imaging of the chest was performed using the standard protocol during bolus administration of intravenous contrast. Multiplanar CT image reconstructions and MIPs were obtained to evaluate the vascular anatomy. CONTRAST:  64mL OMNIPAQUE IOHEXOL 350 MG/ML SOLN COMPARISON:  Chest x-ray from  same day. CT chest dated February 28, 2016. FINDINGS: Cardiovascular: Satisfactory opacification of the pulmonary arteries to the segmental level. Positive for acute pulmonary emboli with saddle pulmonary embolus and bilateral lobar and segmental pulmonary emboli involving all lobes. Elevated RV/LV ratio of 2.7 normal heart size. No pericardial effusion. No thoracic aortic aneurysm. Coronary, aortic arch, and branch vessel atherosclerotic vascular disease. Mediastinum/Nodes: No enlarged mediastinal, hilar, or axillary lymph nodes. Thyroid gland, trachea, and esophagus demonstrate no significant findings. Lungs/Pleura: Scattered parenchymal scarring corresponding to sites of prior infection/inflammation seen on prior chest CT from 2017. Mild mosaic perfusion. No focal consolidation, pleural effusion, or pneumothorax. Left lower lobe peribronchial thickening. No suspicious pulmonary nodule. Upper Abdomen: No acute abnormality.  Musculoskeletal: No chest wall abnormality. No acute or significant osseous findings. T2 and T8 vertebral body hemangiomas. Review of the MIP images confirms the above findings. IMPRESSION: 1. Positive for acute PE with CT evidence of right heart strain (RV/LV Ratio = 2.7) consistent with at least submassive (intermediate risk) PE. The presence of right heart strain has been associated with an increased risk of morbidity and mortality. Please activate Code PE by paging [phone]. 2.  Aortic atherosclerosis (ICD10-I70.0). Critical Value/emergent results were called by telephone at the time of interpretation on 09/27/2019 at 11:26 pm to providerDAVID YAO , who verbally acknowledged these results. Electronically Signed   By: Titus Dubin M.D.   On: 09/27/2019 23:28   [name] Contrast  Result Date: 09/28/2019 CLINICAL DATA:  TIA.  Left-sided weakness EXAM: MRI HEAD WITHOUT CONTRAST TECHNIQUE: Multiplanar, multiecho pulse sequences of the brain and surrounding structures were obtained without intravenous contrast. COMPARISON:  Head CT from yesterday.  Brain MRI 01/02/2017 FINDINGS: Brain: No acute infarction, hemorrhage, hydrocephalus, extra-axial collection or mass lesion. Severe brain atrophy, especially notable in the frontal and temporal lobes. Confluent ischemic gliosis in the deep cerebral white matter with remote cortically based infarcts along the high right frontal convexity. Vascular: Major flow voids are preserved Skull and upper cervical spine: Negative for marrow lesion Sinuses/Orbits: No acute finding Other: Significant motion degradation. IMPRESSION: 1. Motion degraded study without acute finding. 2. Advanced frontotemporal atrophy. 3. Extensive chronic small vessel ischemia. Electronically Signed   By: Monte Fantasia M.D.   On: 09/28/2019 06:07   Dg Chest Port 1 View  Result Date: 09/27/2019 CLINICAL DATA:  Cough EXAM: PORTABLE CHEST 1 VIEW COMPARISON:  February 25, 2019 FINDINGS: Unchanged  cardiomediastinal silhouette. Aortic knob calcifications. Both lungs are clear. The visualized skeletal structures are unremarkable. IMPRESSION: No acute cardiopulmonary process. Electronically Signed   By: Prudencio Pair M.D.   On: 09/27/2019 22:13      Scheduled Meds:  atorvastatin  20 mg Oral q1800   donepezil  10 mg Oral QHS   insulin aspart  0-9 Units Subcutaneous Q4H   Continuous Infusions:  cefTRIAXone (ROCEPHIN)  IV Stopped (09/28/19 1042)   heparin 1,100 Units/hr (09/28/19 0900)     LOS: 0 days      Time spent: 35 minutes   Dessa Phi, DO [facility] 09/28/2019, 11:15 AM   Available via Epic secure chat 7am-7pm After these hours, please refer to coverage provider listed on [email or web address]

## 2019-09-28 NOTE — ED Notes (Signed)
MD notified about troponin. No orders given.

## 2019-09-28 NOTE — ED Notes (Signed)
Patient transported to MRI 

## 2019-09-28 NOTE — Consult Note (Addendum)
NAME:  [name] [name], MRN:  [id number], DOB:  [birth year], LOS: 0 ADMISSION DATE:  09/27/2019, CONSULTATION DATE:  09/28/2019 REFERRING MD:  Dr. Roel Cluck, TRH, CHIEF COMPLAINT:  PE hypotension    Brief History   85yo M admitted with possible seizure like activity and found to have saddle PE with CT evidence of right heart stain. PCCM consulted for discussion on need for thrombolytics.   History of present illness   85yo M with multiple comorbitities including chronic atrial fibrillation not on anticoagulation due to frequent mechanical falls who presented to the ED with complaints of AMS and possible seizure like activity. Patients home CNA noticed sudden tremors of upper extremity with fixed left gaze lasting approximately 10 seconds. Of note patient has history of prior left CVA and chronic left gaze as well as documented history of tremors. History obtained from chart review and son as patient has history of dementia and is a poor historian. Patient and family report no headache, fever, chills, weight changes, chest pain, shortness of breath, abdominal pain, or lower extremity edema.   Workup on admission revealed hyperglycemia, mildly elevated troponin, negative MRI brain, negative head CT, and A-fib on 12 lead EKG. CT angio chest with acute saddle PE and CT evidence of right heart strain consistent with at least submassive PE.   PCCM consulted for possible need of thrombolytics.   Past Medical History  Chronic atrial fibrillation Dementia  Right sided CVA 2018 CKD stage III Chronic diastolic CHF Frequent falls COPD Tremors Left eye deviation  Type 2 diabetes  Hyperlipidemia  Hypertension Sleep apnea, no cpap use  Urinary retention  Arthritis   Significant Hospital Events   10/8 Admitted   Consults:  PCCM  Procedures:    Significant Diagnostic Tests:  Head CT 10/8 >> negative for acute abnormalities MRI brain 10/8 >> negative  CT amgio chest 10/8 >> Acute saddle PE and  CT evidence of right heart strain consistent with at least submassive PE.   Micro Data:  COVID 10/8 >> neg  Antimicrobials:    Interim history/subjective:  RN reported transient episode of hypotension since admission. Patient lying in bed pleasantly confused with no acute complaints.   Objective   Blood pressure 100/75, pulse 90, temperature 98.3 F (36.8 C), temperature source Oral, resp. rate 20, height 5\' 8"  (1.727 m), weight 72.6 kg, SpO2 97 %.       No intake or output data in the 24 hours ending 09/28/19 0724 Filed Weights   09/27/19 2146  Weight: 72.6 kg    Examination: General: Chronically ill appearing elderly male lying in bed in NAD HEENT: MM pink/moist, PERRL,  Neuro: Alert and oriented x2, no focal neuro deficits, able to follow all commands CV: s1s2 regular rate and rhythm, no murmur, rubs, or gallops,  PULM:  Clear to ascultation bilaterally, no increased work of breathing  GI: soft, bowel sounds active in all 4 quadrants, non-tender, non-distended Extremities: warm/dry, no edema  Skin: no rashes or lesions   Resolved Hospital Problem list     Assessment & Plan:  Saddle Pulmonary Embolism -In the setting of chronic atrial fibrillation not on anticoagulation due to frequent fall. Verified with family they would not want to pursue aggressive measure such as TPA. He has no absolute contraindications for thrombolytics with one relative contrindication including age greater than 72 therefore if patient decompensates consideration to thrombolytics could be made P:  Heparin drip per primary team Obtain ECHO  Lower extremity dopplers  Cycle  troponin CHA2DS2-VASc score 7 HAS-BLED score 3 Will defer decision for long term anticoagulation to primary team, would recommend easily reversible agent such as Coumadin if decision made for long term anticoagulation.    He has been deemed not a candidate for long term anticoagulation in the past   Chronic A-fib not on  anticoagulation  Mildly elevated troponin  P: Continuous telemetry  Monitor and supplement electrolytes Heparin drip as above  Trend troponin   Sleep apnea  -Non compliant with cpap P: Supportive care Outpatient follow up    Rest per primary team    Best practice:  Diet: Per primary  Pain/Anxiety/Delirium protocol (if indicated): N/A VAP protocol (if indicated): N/A DVT prophylaxis: Heparin drip GI prophylaxis: PPI Glucose control: SSI Mobility: bedrest Code Status: DNR Family Communication: Updated son at bedside  Disposition: Per primary   Labs   CBC: Recent Labs  Lab 09/27/19 2211 09/27/19 2219 09/28/19 0417  WBC 15.4*  --  11.3*  NEUTROABS 13.2*  --   --   HGB 13.2 13.6 12.8*  HCT 39.7 40.0 39.1  MCV 97.1  --  98.2  PLT 192  --  123456    Basic Metabolic Panel: Recent Labs  Lab 09/27/19 2211 09/27/19 2219 09/28/19 0417  NA 135 137 136  K 4.9 4.8 3.9  CL 104 104 102  CO2 20*  --  24  GLUCOSE 351* 350* 284*  BUN 21 28* 18  CREATININE 1.50* 1.30* 1.40*  CALCIUM 8.8*  --  8.9  MG 2.0  --  2.1  PHOS  --   --  3.5   GFR: Estimated Creatinine Clearance: 33.3 mL/min (A) (by C-G formula based on SCr of 1.4 mg/dL (H)). Recent Labs  Lab 09/27/19 2211 09/28/19 0417  WBC 15.4* 11.3*    Liver Function Tests: Recent Labs  Lab 09/27/19 2211 09/28/19 0417  AST 33 17  ALT 22 22  ALKPHOS 101 98  BILITOT 1.7* 0.9  PROT 6.4* 6.4*  ALBUMIN 3.4* 3.1*   No results for input(s): LIPASE, AMYLASE in the last 168 hours. No results for input(s): AMMONIA in the last 168 hours.  ABG    Component Value Date/Time   PHART 7.290 (L) 01/28/2016 2351   PCO2ART 35.9 01/28/2016 2351   PO2ART 74.0 (L) 01/28/2016 2351   HCO3 17.3 (L) 01/28/2016 2351   TCO2 24 09/27/2019 2219   ACIDBASEDEF 8.0 (H) 01/28/2016 2351   O2SAT 93.0 01/28/2016 2351     Coagulation Profile: No results for input(s): INR, PROTIME in the last 168 hours.  Cardiac Enzymes: No results  for input(s): CKTOTAL, CKMB, CKMBINDEX, TROPONINI in the last 168 hours.  HbA1C: Hgb A1c MFr Bld  Date/Time Value Ref Range Status  09/28/2019 04:17 AM 8.9 (H) 4.8 - 5.6 % Final    Comment:    (NOTE) Pre diabetes:          5.7%-6.4% Diabetes:              >6.4% Glycemic control for   <7.0% adults with diabetes   10/03/2017 03:55 AM 7.0 (H) 4.8 - 5.6 % Final    Comment:    (NOTE) Pre diabetes:          5.7%-6.4% Diabetes:              >6.4% Glycemic control for   <7.0% adults with diabetes     CBG: Recent Labs  Lab 09/28/19 0405  GLUCAP 269*    Review of Systems: Positive  in bold   Gen: Denies fever, chills, weight change, fatigue, night sweats HEENT: Denies blurred vision, double vision, hearing loss, tinnitus, sinus congestion, rhinorrhea, sore throat, neck stiffness, dysphagia PULM: Denies shortness of breath, cough, sputum production, hemoptysis, wheezing CV: Denies chest pain, edema, orthopnea, paroxysmal nocturnal dyspnea, palpitations GI: Denies abdominal pain, nausea, vomiting, diarrhea, hematochezia, melena, constipation, change in bowel habits GU: Denies dysuria, hematuria, polyuria, oliguria, urethral discharge Endocrine: Denies hot or cold intolerance, polyuria, polyphagia or appetite change Derm: Denies rash, dry skin, scaling or peeling skin change Heme: Denies easy bruising, bleeding, bleeding gums Neuro: Denies headache, numbness, weakness, slurred speech, loss of memory or consciousness   Past Medical History  He,  has a past medical history of Arthritis, Chronic atrial fibrillation ([place]), Diabetes mellitus without complication ([place]), Hyperlipidemia, Hypertension, Sleep apnea, and Urinary retention (foley last changed 3 weeks ago).    Surgical History    Past Surgical History:  Procedure Laterality Date   BACK SURGERY  yrs ago   lower back surgery   PROSTATE SURGERY  yrs ago, no cancer   TONSILLECTOMY  as child   TRANSURETHRAL RESECTION OF  PROSTATE  02/22/2012   Procedure: TRANSURETHRAL RESECTION OF THE PROSTATE WITH GYRUS INSTRUMENTS;  Surgeon: Molli Hazard, MD;  Location: [facility];  Service: Urology;  Laterality: N/A;        Social History   reports that he quit smoking about 38 years ago. His smoking use included cigarettes. He has a 25.00 pack-year smoking history. He has never used smokeless tobacco. He reports current alcohol use. He reports that he does not use drugs.    Family History   His family history includes Heart attack in his father.   Allergies Allergies  Allergen Reactions   Sulfa Antibiotics Swelling    Swelling of hands     Home Medications  Prior to Admission medications   Medication Sig Start Date End Date Taking? Authorizing Provider  aspirin 325 MG tablet Take 1 tablet (325 mg total) by mouth daily. Patient not taking: Reported on 02/25/2019 01/05/17   Florencia Reasons, MD  atorvastatin (LIPITOR) 20 MG tablet Take 1 tablet (20 mg total) by mouth daily at 6 PM. 01/04/17   Florencia Reasons, MD  CALCIUM PO Take 0.5 tablets by mouth daily.    [provider]  donepezil (ARICEPT) 10 MG tablet Take 10 mg by mouth at bedtime.  01/09/16   [provider]  finasteride (PROSCAR) 5 MG tablet Take 5 mg by mouth at bedtime.     [provider]  glimepiride (AMARYL) 2 MG tablet Take 2 mg by mouth at bedtime.  12/16/18   [provider]  Melatonin 10 MG TABS Take 10 mg by mouth at bedtime.    [provider]  metoprolol succinate (TOPROL-XL) 50 MG 24 hr tablet Take 1 tablet (50 mg total) by mouth at bedtime. Patient is overdue for an appointment and needs to schedule for further refills 2nd attempt 10/01/17   Sherren Mocha, MD     Critical care time:    Johnsie Cancel, NP-C [facility] Pgr: [phone] or if no answer [phone] 09/28/2019, 8:07 AM

## 2019-09-28 NOTE — ED Notes (Signed)
Pt is A-fib w/ BBB on monitor 

## 2019-09-28 NOTE — Progress Notes (Signed)
Lower extremity venous has been completed.   Preliminary results in CV Proc.   Abram Sander 09/28/2019 2:30 PM

## 2019-09-28 NOTE — Procedures (Signed)
Patient Name: [name] [name]  MRN: [id number]  Epilepsy Attending: Lora Havens  Referring Physician/Provider: Dr. Fortino Sic Date: 09/28/2019 Duration: 25.23 minutes  Patient history: 85 year old male with left frontotemporal atrophy, right frontal infarct who presented with sudden tremors in upper extremity, fixed left gaze for about 10 seconds.  EEG to evaluate for seizures.  Level of alertness: Awake, drowsy  AEDs during EEG study: None  Technical aspects: This EEG study was done with scalp electrodes positioned according to the 10-20 International system of electrode placement. Electrical activity was acquired at a sampling rate of 500Hz  and reviewed with a high frequency filter of 70Hz  and a low frequency filter of 1Hz . EEG data were recorded continuously and digitally stored.   Description:The posterior dominant rhythm consists of 8 Hz activity of moderate voltage (25-35 uV) seen predominantly in posterior head regions, symmetric and reactive to eye opening and eye closing.  Drowsiness was characterized by attenuation of the posterior background rhythm.  There was intermittent 2 to 3 Hz left and right frontotemporal slowing. Hyperventilation and photic stimulation were not performed.  Abnormality -Intermittent slowing, left and right frontotemporal  IMPRESSION: This study is suggestive of cortical dysfunction in left and right frontotemporal region, could be secondary to underlying infarct and atrophy. No seizures or epileptiform discharges were seen throughout the recording.  [name] Barbra Sarks

## 2019-09-28 NOTE — Progress Notes (Signed)
[place] for heparin Indication: pulmonary embolus  Allergies  Allergen Reactions  . Sulfa Antibiotics Swelling    Swelling of hands    Patient Measurements: Height: 5\' 8"  (172.7 cm) Weight: 160 lb (72.6 kg) IBW/kg (Calculated) : 68.4  Vital Signs: Temp: 98.3 F (36.8 C) (10/07 2153) Temp Source: Oral (10/07 2153) BP: 98/82 (10/08 0800) Pulse Rate: 71 (10/08 0815)  Labs: Recent Labs    09/27/19 2211 09/27/19 2219 09/28/19 0059 09/28/19 0417 09/28/19 0653 09/28/19 0750  HGB 13.2 13.6  --  12.8*  --   --   HCT 39.7 40.0  --  39.1  --   --   PLT 192  --   --  164  --   --   HEPARINUNFRC  --   --   --   --   --  0.72*  CREATININE 1.50* 1.30*  --  1.40*  --   --   TROPONINIHS 32*  --  94* 139* 125*  --     Estimated Creatinine Clearance: 33.3 mL/min (A) (by C-G formula based on SCr of 1.4 mg/dL (H)).  Assessment: CC/HPI: 85 yo m presenting with sudden upper extremity tremors and left sized gaze  PMH: dementia, afib, hx stroke  Anticoag: none pta - iv hep for PE  Pulm: CTA acute PE RHS LV RV ratio 2.7 - son does not want tPA   Heme/Onc: H&H 13.6/40, Plt 192  Goal of Therapy:  Heparin level 0.3-0.7 units/ml Monitor platelets by anticoagulation protocol: Yes   Plan:  - Initial Heparin level slightly supra-therapeutic  - Will decrease heparin to 1100 units/hr  - Will recheck heparin level in 8 hours - Continue monitoring CBC and for s/s of bleeding - F/U plans for oral [facility]  Duanne Limerick PharmD, BCPS Clinical Pharmacist [phone]  Please check AMION for all [place] numbers  09/28/2019 8:44 AM

## 2019-09-28 NOTE — Progress Notes (Signed)
EEG complete - results pending 

## 2019-09-28 NOTE — Evaluation (Signed)
Clinical/Bedside Swallow Evaluation Patient Details  Name: LAQUANE DELAUGHTER MRN: [id number] Date of Birth: 1926/10/25  Today's Date: 09/28/2019 Time: SLP Start Time (ACUTE ONLY): 1146 SLP Stop Time (ACUTE ONLY): 1157 SLP Time Calculation (min) (ACUTE ONLY): 11 min  Past Medical History:  Past Medical History:  Diagnosis Date  . Arthritis   . Chronic atrial fibrillation ([place])   . Diabetes mellitus without complication ([place])   . Hyperlipidemia   . Hypertension   . Sleep apnea    no cpap used, sleep study was several yrs ago  . Urinary retention foley last changed 3 weeks ago   Past Surgical History:  Past Surgical History:  Procedure Laterality Date  . BACK SURGERY  yrs ago   lower back surgery  . PROSTATE SURGERY  yrs ago, no cancer  . TONSILLECTOMY  as child  . TRANSURETHRAL RESECTION OF PROSTATE  02/22/2012   Procedure: TRANSURETHRAL RESECTION OF THE PROSTATE WITH GYRUS INSTRUMENTS;  Surgeon: Molli Hazard, MD;  Location: [facility];  Service: Urology;  Laterality: N/A;       HPI:  CLIO DZIUBA is a 85 year old male with past medical history significant for chronic atrial fibrillation not on anticoagulation, type 2 diabetes, chronic kidney disease stage III, history of CVA with chronic left-sided weakness, chronic diastolic heart failure, COPD, hypertension, dementia, OSA not on CPAP who presents with sudden tremor of upper extremity, fixed left gaze for around 10 seconds.  Upon EMS arrival, they did not note any neuro deficits.  Patient does have history of CVA with chronic left-sided weakness.  Patient had some altered mental status for about 3 to 4 minutes after the event, no history of seizures in the past.  In the emergency department, CTA chest revealed bilateral saddle PE. Head and chest imaging negative for acute findings.   Assessment / Plan / Recommendation Clinical Impression  Pt presents with functional swallowing as assessed clinically.  Pt tolerated all  consistencies trialed with no clinical s/s of aspiration and exhibited good oral clearance of solids.  Pt reports some coughing with meals, but this was not observed during this evaluation.  Chest imaging is not concerning for aspiration or pneumonia.  Pt exhibited functional mastication despite partial edentulism.  Recommend continuing regular texture diet with thin liquid.  Pt has no further ST needs at this time. SLP Visit Diagnosis: Dysphagia, unspecified (R13.10)    Aspiration Risk  No limitations    Diet Recommendation Regular;Thin liquid   Liquid Administration via: Cup;Straw Medication Administration: Whole meds with liquid Supervision: Staff to assist with self feeding Compensations: Small sips/bites;Slow rate Postural Changes: Seated upright at 90 degrees    Other  Recommendations Oral Care Recommendations: Oral care BID   Follow up Recommendations None      Frequency and Duration   N/A         Prognosis   N/A     Swallow Study   General Date of Onset: 09/27/19 HPI: TRUITT CIFARELLI is a 85 year old male with past medical history significant for chronic atrial fibrillation not on anticoagulation, type 2 diabetes, chronic kidney disease stage III, history of CVA with chronic left-sided weakness, chronic diastolic heart failure, COPD, hypertension, dementia, OSA not on CPAP who presents with sudden tremor of upper extremity, fixed left gaze for around 10 seconds.  Upon EMS arrival, they did not note any neuro deficits.  Patient does have history of CVA with chronic left-sided weakness.  Patient had some altered mental status for about 3  to 4 minutes after the event, no history of seizures in the past.  In the emergency department, CTA chest revealed bilateral saddle PE. Head and chest imaging negative for acute findings. Type of Study: Bedside Swallow Evaluation Diet Prior to this Study: Regular Temperature Spikes Noted: No History of Recent Intubation:  No Behavior/Cognition: Alert;Cooperative;Pleasant mood Oral Cavity Assessment: Within Functional Limits Oral Care Completed by SLP: No Oral Cavity - Dentition: Missing dentition Self-Feeding Abilities: Needs assist Patient Positioning: Upright in bed Baseline Vocal Quality: Normal(some harsh vocal quality at baseline) Volitional Cough: Strong Volitional Swallow: Able to elicit    Oral/Motor/Sensory Function Overall Oral Motor/Sensory Function: Mild impairment Facial ROM: Within Functional Limits Facial Symmetry: Within Functional Limits Lingual ROM: Within Functional Limits Lingual Symmetry: Within Functional Limits Lingual Strength: Within Functional Limits Velum: Within Functional Limits Mandible: Within Functional Limits   Ice Chips Ice chips: Not tested   Thin Liquid Thin Liquid: Within functional limits Presentation: Cup;Straw    Nectar Thick Nectar Thick Liquid: Not tested   Honey Thick Honey Thick Liquid: Not tested   Puree Puree: Within functional limits Presentation: Spoon   Solid     Solid: Within functional limits Presentation: (SLP fed)      Celedonio Savage, [place], [place] Office: [phone]; Pager (10/8): [phone] 09/28/2019,12:11 PM

## 2019-09-28 NOTE — ED Notes (Signed)
Pt resting quietly with no complaints voiced.

## 2019-09-28 NOTE — Progress Notes (Signed)
ANTICOAGULATION CONSULT NOTE   Pharmacy Consult for Heparin Indication: pulmonary embolus  Patient Measurements: Height: 5\' 8"  (172.7 cm) Weight: 160 lb (72.6 kg) IBW/kg (Calculated) : 68.4  Vital Signs: BP: 114/83 (10/08 1730) Pulse Rate: 63 (10/08 1730)  Labs: Recent Labs    09/27/19 2211 09/27/19 2219 09/28/19 0059 09/28/19 0417 09/28/19 0653 09/28/19 0750 09/28/19 1700  HGB 13.2 13.6  --  12.8*  --   --   --   HCT 39.7 40.0  --  39.1  --   --   --   PLT 192  --   --  164  --   --   --   HEPARINUNFRC  --   --   --   --   --  0.72* 0.72*  CREATININE 1.50* 1.30*  --  1.40*  --   --   --   TROPONINIHS 32*  --  94* 139* 125*  --   --     Estimated Creatinine Clearance: 33.3 mL/min (A) (by C-G formula based on SCr of 1.4 mg/dL (H)).  Assessment: 29 YOM who presented on  10/8 with sudden upper extremity tremors and left sized gaze and found to have an acute submassive PE. Pharmacy consulted for Heparin dosing.   Heparin level this evening resulted as SUPRAtherapeutic after a rate decrease earlier today (HL 0.72, goal of 0.3-0.7). Some slight bleeding around the patient's IV site noted by RN. Will reduce the rate.   Goal of Therapy:  Heparin level 0.3-0.7 units/ml Monitor platelets by anticoagulation protocol: Yes   Plan:  - Decrease Heparin drip rate to 1000 units/hr (10 ml/hr) - Will continue to monitor for any signs/symptoms of bleeding and will follow up with heparin level in 8 hours  - F/U plans for oral [facility]  Thank you for allowing pharmacy to be a part of this patient's care.  Alycia Rossetti, PharmD, BCPS Clinical Pharmacist Clinical phone for 09/28/2019: [phone] 09/28/2019 7:29 PM   **Pharmacist phone directory can now be found on [email or web address] ([email or web address]).  Listed under [place].

## 2019-09-29 LAB — CBC
HCT: 38.2 % — ABNORMAL LOW (ref 39.0–52.0)
Hemoglobin: 12.7 g/dL — ABNORMAL LOW (ref 13.0–17.0)
MCH: 32.2 pg (ref 26.0–34.0)
MCHC: 33.2 g/dL (ref 30.0–36.0)
MCV: 96.7 fL (ref 80.0–100.0)
Platelets: 144 K/uL — ABNORMAL LOW (ref 150–400)
RBC: 3.95 MIL/uL — ABNORMAL LOW (ref 4.22–5.81)
RDW: 13.4 % (ref 11.5–15.5)
WBC: 10.4 K/uL (ref 4.0–10.5)
nRBC: 0 % (ref 0.0–0.2)

## 2019-09-29 LAB — BASIC METABOLIC PANEL
Anion gap: 12 (ref 5–15)
BUN: 14 mg/dL (ref 8–23)
CO2: 22 mmol/L (ref 22–32)
Calcium: 9 mg/dL (ref 8.9–10.3)
Chloride: 105 mmol/L (ref 98–111)
Creatinine, Ser: 1.17 mg/dL (ref 0.61–1.24)
GFR calc Af Amer: >60 mL/min (ref >60)
GFR calc non Af Amer: 54 mL/min — ABNORMAL LOW (ref >60)
Glucose, Bld: 148 mg/dL — ABNORMAL HIGH (ref 70–99)
Potassium: 3.9 mmol/L (ref 3.5–5.1)
Sodium: 139 mmol/L (ref 135–145)

## 2019-09-29 LAB — GLUCOSE, CAPILLARY
Glucose-Capillary: 131 mg/dL — ABNORMAL HIGH (ref 70–99)
Glucose-Capillary: 163 mg/dL — ABNORMAL HIGH (ref 70–99)
Glucose-Capillary: 193 mg/dL — ABNORMAL HIGH (ref 70–99)
Glucose-Capillary: 147 mg/dL — ABNORMAL HIGH (ref 70–99)

## 2019-09-29 LAB — HEPARIN LEVEL (UNFRACTIONATED): Heparin Unfractionated: 0.66 [IU]/mL (ref 0.30–0.70)

## 2019-09-29 NOTE — Evaluation (Signed)
Physical Therapy Evaluation Patient Details Name: [name] [name] MRN: [id number] DOB: [birth year] Today's Date: 09/29/2019   History of Present Illness  [name] [name] is a 85 year old male with past medical history significant for chronic atrial fibrillation not on anticoagulation, type 2 diabetes, chronic kidney disease stage III, history of CVA with chronic left-sided weakness, chronic diastolic heart failure, COPD, hypertension, dementia, OSA not on CPAP who presents with sudden tremor of upper extremity, fixed left gaze for around 10 seconds.  Upon EMS arrival, they did not note any neuro deficits.  Patient does have history of CVA with chronic left-sided weakness.  Patient had some altered mental status for about 3 to 4 minutes after the event, no history of seizures in the past.  In the emergency department, CTA chest revealed bilateral saddle PE.    Clinical Impression  Unsure of pt assist at home but pt will need 24/7 care and physical assist for mobility.  Pt not oriented to place or situation so hard to know his caregiving availabilty at home.  Pt needed +2 assist for getting OOB today.  Will continue to work with pt on acute PT level to help get him stronger for his DC plans.    Follow Up Recommendations SNF;Supervision/Assistance - 24 hour    Equipment Recommendations  None recommended by PT    Recommendations for Other Services       Precautions / Restrictions Precautions Precautions: Fall Precaution Comments: pt reports he has fallen a lot.  asked me to bring 2 big men to help get him up Restrictions Weight Bearing Restrictions: No Other Position/Activity Restrictions: Pt with IV and foley intact      Mobility  Bed Mobility Overal bed mobility: Needs Assistance Bed Mobility: Supine to Sit     Supine to sit: Mod assist;HOB elevated     General bed mobility comments: pt helped to move his legs to edge of bed and then needed mod assist to help get his trunk off  the bed.  pt then was abel to scoot forward - although needed max cues - he wanted to hold onto me for su pport to pull forward  Transfers Overall transfer level: Needs assistance Equipment used: Rolling walker (2 wheeled) Transfers: Sit to/from Stand Sit to Stand: Mod assist;+2 physical assistance;From elevated surface         General transfer comment: Pt did 3 stands from EOB with RW - pt did well getting up but then balance was posterior to his feet - pt side stepped to get up higher in the bed but then would loose balance backward and sit back on the bed.  Pt wtih poor safety awareness  Ambulation/Gait             General Gait Details: unable today  Stairs            Wheelchair Mobility    Modified Rankin (Stroke Patients Only)       Balance Overall balance assessment: Needs assistance   Sitting balance-Leahy Scale: Poor Sitting balance - Comments: unsure if bed not even or pt - pt loosing balance to the left today when sitting     Standing balance-Leahy Scale: Poor Standing balance comment: pt loosing balance posterior when stnading - hard time getting his COG over his BOS                             Pertinent Vitals/Pain Pain Assessment: No/denies pain Pain  Intervention(s): Monitored during session    Home Living Family/patient expects to be discharged to:: Private residence                 Additional Comments: Pt reports he lives with his parents (he is 89) and is now on a ship.  no information about his home obtained from pt.  no family present    Prior Function                 Hand Dominance        Extremity/Trunk Assessment   Upper Extremity Assessment Upper Extremity Assessment: Defer to OT evaluation    Lower Extremity Assessment Lower Extremity Assessment: Generalized weakness(pt has history of left side weakness - pt abel to move left leg in the bed.  not formally tested)    Cervical / Trunk  Assessment Cervical / Trunk Assessment: Kyphotic  Communication   Communication: HOH  Cognition Arousal/Alertness: Awake/alert Behavior During Therapy: Impulsive Overall Cognitive Status: No family/caregiver present to determine baseline cognitive functioning                                 General Comments: pt didnt know where he was or why - thougth he was on a ship.  pt also reports he lives with his parents (pt is 9)      General Comments General comments (skin integrity, edema, etc.): pts IV was leaking when pt sat up - nursing called in to look at it.  this is why we didnt get pt up in chair at this time.    Exercises     Assessment/Plan    PT Assessment Patient needs continued PT services  PT Problem List Decreased strength;Decreased mobility;Decreased safety awareness;Decreased activity tolerance;Cardiopulmonary status limiting activity;Decreased skin integrity;Decreased balance       PT Treatment Interventions DME instruction;Therapeutic activities;Gait training;Therapeutic exercise;Patient/family education;Balance training;Functional mobility training;Neuromuscular re-education    PT Goals (Current goals can be found in the Care Plan section)  Acute Rehab PT Goals Patient Stated Goal: im ready to go home with my parents PT Goal Formulation: With patient Time For Goal Achievement: 10/13/19 Potential to Achieve Goals: Fair    Frequency Min 3X/week   Barriers to discharge   unsure of pts caregiver support at DC    Co-evaluation PT/OT/SLP Co-Evaluation/Treatment: Yes Reason for Co-Treatment: Necessary to address cognition/behavior during functional activity;For patient/therapist safety PT goals addressed during session: Mobility/safety with mobility OT goals addressed during session: ADL's and self-care       AM-PAC PT "6 Clicks" Mobility  Outcome Measure Help needed turning from your back to your side while in a flat bed without using bedrails?:  A Little Help needed moving from lying on your back to sitting on the side of a flat bed without using bedrails?: A Lot Help needed moving to and from a bed to a chair (including a wheelchair)?: A Lot Help needed standing up from a chair using your arms (e.g., wheelchair or bedside chair)?: A Lot Help needed to walk in hospital room?: A Lot Help needed climbing 3-5 steps with a railing? : Total 6 Click Score: 12    End of Session Equipment Utilized During Treatment: Gait belt Activity Tolerance: Patient limited by fatigue Patient left: in bed;with call bell/phone within reach;with nursing/sitter in room Nurse Communication: Mobility status;Precautions PT Visit Diagnosis: Unsteadiness on feet (R26.81);Muscle weakness (generalized) (M62.81);Difficulty in walking, not elsewhere classified (R26.2)  Time: [id number] PT Time Calculation (min) (ACUTE ONLY): 30 min   Charges:   PT Evaluation $PT Eval Low Complexity: 1 Low PT Treatments $Therapeutic Activity: 8-22 mins        09/29/2019   Rande Lawman, PT   Loyal Buba 09/29/2019, 1:01 PM

## 2019-09-29 NOTE — Progress Notes (Addendum)
PROGRESS NOTE    [name] [name]  [id number] DOB: [birth year] DOA: 09/27/2019 PCP: Haywood Pao, MD     Brief Narrative:  [name] [name] is a 85 year old male with past medical history significant for chronic atrial fibrillation not on anticoagulation, type 2 diabetes, chronic kidney disease stage III, history of CVA with chronic left-sided weakness, chronic diastolic heart failure, COPD, hypertension, dementia, OSA not on CPAP who presents with sudden tremor of upper extremity, fixed left gaze for around 10 seconds.  Upon EMS arrival, they did not note any neuro deficits.  Patient does have history of CVA with chronic left-sided weakness.  Patient had some altered mental status for about 3 to 4 minutes after the event, no history of seizures in the past.  In the emergency department, CTA chest revealed bilateral saddle PE.  He was started on IV heparin, critical care consulted.  New events last 24 hours / Subjective: States that he is not feeling well. He is oriented to person and place but not year. States he is sleepy, some shortness of breath but denies chest pain. Endorses nausea, without vomiting.   Assessment & Plan:   Principal Problem:   Pulmonary embolism (HCC) Active Problems:   Hyperlipidemia   HYPERTENSION, BENIGN   Atrial fibrillation, chronic   GERD   Hyperglycemia   Elevated troponin   COPD (chronic obstructive pulmonary disease) with chronic bronchitis (HCC)   Dementia without behavioral disturbance (HCC)   CKD (chronic kidney disease), stage III   Chronic diastolic CHF (congestive heart failure) ([place])   Acute bilateral saddle PE -PCCM consulted, did not recommend thrombolytic.  Signed off 10/8 -Echocardiogram EF 60-65%, normal LV function, global right ventricle with moderate reduced systolic function -Venous duplex Dopplers negative for DVT  -Continue IV heparin for 3 to 5 days, consider transition to oral anticoagulant at that time  Concern for  seizure activity -Discussed with neurology at the time of admission, they recommended MRI, echo, EEG.  If abnormal, plan for formal neurologic consultation.  Hold off on antiepileptics for now as patient has no history of seizures and this was not a definite seizure -MRI: motion degraded study without acute finding -EEG: no seizures or epileptiform discharges were seen throughout the recording. -No further events while in hospital   Demand ischemia -Secondary to PE, peaked at 139 and trended downward -Echocardiogram without wall motion abnormality   Chronic atrial fibrillation -CHA2DS2-VASc score 5 -IV heparin -Telemetry   Hyperlipidemia -Continue Lipitor  Dementia -Appears to be stable for now -Continue Aricept  Chronic kidney disease stage III -Baseline creatinine 1.4-1.7 -Stable  Chronic diastolic heart failure -Euvolemic at this time.  BNP 154 -Holding Toprol, patient had episode of nonsustained bradycardia on admission   Pyuria -Empiric Rocephin -Urine culture pending  Type 2 diabetes with hyperglycemia -Hold Amaryl -Lantus, sliding scale insulin    DVT prophylaxis: IV heparin Code Status: DNR Family Communication: Spoke with daughter over the phone this afternoon  Disposition Plan: Continue IV heparin for now   Consultants:   PCCM  Procedures:   None   Antimicrobials:  Anti-infectives (From admission, onward)   Start     Dose/Rate Route Frequency Ordered Stop   09/28/19 1000  cefTRIAXone (ROCEPHIN) 1 g in sodium chloride 0.9 % 100 mL IVPB     1 g 200 mL/hr over 30 Minutes Intravenous Every 24 hours 09/28/19 0839         Objective: Vitals:   09/28/19 2339 09/29/19 0400 09/29/19 0421 09/29/19 [id number]  BP: 139/87  113/69 129/75  Pulse: 80 60 66 83  Resp:   18 18  Temp: 98.4 F (36.9 C)  98 F (36.7 C) 97.7 F (36.5 C)  TempSrc: Oral  Oral Oral  SpO2: 96%  96% 97%  Weight:      Height:        Intake/Output Summary (Last 24 hours) at  09/29/2019 0856 Last data filed at 09/29/2019 [id number] Gross per 24 hour  Intake 100 ml  Output 175 ml  Net -75 ml   Filed Weights   09/27/19 2146  Weight: 72.6 kg    Examination: General exam: Appears calm and comfortable  Respiratory system: Clear to auscultation. Respiratory effort normal. On 3L [place] O2 without respiratory distress or accessory muscle use, no increase in effort  Cardiovascular system: S1 & S2 heard, Irreg rhythm, rate 80s. No JVD, murmurs, rubs, gallops or clicks. No pedal edema. Gastrointestinal system: Abdomen is nondistended, soft and nontender. No organomegaly or masses felt. Normal bowel sounds heard. Central nervous system: Alert and oriented x2. No focal neurological deficits. Extremities: Symmetric  Skin: No rashes, lesions or ulcers Psychiatry: Judgement and insight appear stable   Data Reviewed: I have personally reviewed following labs and imaging studies  CBC: Recent Labs  Lab 09/27/19 2211 09/27/19 2219 09/28/19 0417 09/29/19 0418  WBC 15.4*  --  11.3* 10.4  NEUTROABS 13.2*  --   --   --   HGB 13.2 13.6 12.8* 12.7*  HCT 39.7 40.0 39.1 38.2*  MCV 97.1  --  98.2 96.7  PLT 192  --  164 [id number]*   Basic Metabolic Panel: Recent Labs  Lab 09/27/19 2211 09/27/19 2219 09/28/19 0417 09/29/19 0418  NA 135 137 136 139  K 4.9 4.8 3.9 3.9  CL 104 104 102 105  CO2 20*  --  24 22  GLUCOSE 351* 350* 284* 148*  BUN 21 28* 18 14  CREATININE 1.50* 1.30* 1.40* 1.17  CALCIUM 8.8*  --  8.9 9.0  MG 2.0  --  2.1  --   PHOS  --   --  3.5  --    GFR: Estimated Creatinine Clearance: 39.8 mL/min (by C-G formula based on SCr of 1.17 mg/dL). Liver Function Tests: Recent Labs  Lab 09/27/19 2211 09/28/19 0417  AST 33 17  ALT 22 22  ALKPHOS 101 98  BILITOT 1.7* 0.9  PROT 6.4* 6.4*  ALBUMIN 3.4* 3.1*   No results for input(s): LIPASE, AMYLASE in the last 168 hours. No results for input(s): AMMONIA in the last 168 hours. Coagulation Profile: No results for  input(s): INR, PROTIME in the last 168 hours. Cardiac Enzymes: No results for input(s): CKTOTAL, CKMB, CKMBINDEX, TROPONINI in the last 168 hours. BNP (last 3 results) No results for input(s): PROBNP in the last 8760 hours. HbA1C: Recent Labs    09/28/19 0417  HGBA1C 8.9*   CBG: Recent Labs  Lab 09/28/19 0806 09/28/19 1249 09/28/19 1707 09/28/19 2041 09/29/19 0620  GLUCAP 159* 200* 166* 184* 131*   Lipid Profile: No results for input(s): CHOL, HDL, LDLCALC, TRIG, CHOLHDL, LDLDIRECT in the last 72 hours. Thyroid Function Tests: Recent Labs    09/28/19 0417  TSH 2.066   Anemia Panel: No results for input(s): VITAMINB12, FOLATE, FERRITIN, TIBC, IRON, RETICCTPCT in the last 72 hours. Sepsis Labs: No results for input(s): PROCALCITON, LATICACIDVEN in the last 168 hours.  Recent Results (from the past 240 hour(s))  SARS Coronavirus 2 [facility] order, Performed in [facility]  Health hospital lab) Nasopharyngeal Nasopharyngeal Swab     Status: None   Collection Time: 09/27/19 10:05 PM   Specimen: Nasopharyngeal Swab  Result Value Ref Range Status   SARS Coronavirus 2 NEGATIVE NEGATIVE Final    Comment: (NOTE) If result is NEGATIVE SARS-CoV-2 target nucleic acids are NOT DETECTED. The SARS-CoV-2 RNA is generally detectable in upper and lower  respiratory specimens during the acute phase of infection. The lowest  concentration of SARS-CoV-2 viral copies this assay can detect is 250  copies / mL. A negative result does not preclude SARS-CoV-2 infection  and should not be used as the sole basis for treatment or other  patient management decisions.  A negative result may occur with  improper specimen collection / handling, submission of specimen other  than nasopharyngeal swab, presence of viral mutation(s) within the  areas targeted by this assay, and inadequate number of viral copies  (<250 copies / mL). A negative result must be combined with clinical  observations, patient  history, and epidemiological information. If result is POSITIVE SARS-CoV-2 target nucleic acids are DETECTED. The SARS-CoV-2 RNA is generally detectable in upper and lower  respiratory specimens dur ing the acute phase of infection.  Positive  results are indicative of active infection with SARS-CoV-2.  Clinical  correlation with patient history and other diagnostic information is  necessary to determine patient infection status.  Positive results do  not rule out bacterial infection or co-infection with other viruses. If result is PRESUMPTIVE POSTIVE SARS-CoV-2 nucleic acids MAY BE PRESENT.   A presumptive positive result was obtained on the submitted specimen  and confirmed on repeat testing.  While 2019 novel coronavirus  (SARS-CoV-2) nucleic acids may be present in the submitted sample  additional confirmatory testing may be necessary for epidemiological  and / or clinical management purposes  to differentiate between  SARS-CoV-2 and other Sarbecovirus currently known to infect humans.  If clinically indicated additional testing with an alternate test  methodology [phone]) is advised. The SARS-CoV-2 RNA is generally  detectable in upper and lower respiratory sp ecimens during the acute  phase of infection. The expected result is Negative. Fact Sheet for Patients:  StrictlyIdeas.no Fact Sheet for Healthcare Providers: BankingDealers.co.za This test is not yet approved or cleared by the [place] FDA and has been authorized for detection and/or diagnosis of SARS-CoV-2 by FDA under an Emergency Use Authorization (EUA).  This EUA will remain in effect (meaning this test can be used) for the duration of the COVID-19 declaration under Section 564(b)(1) of the Act, 21 U.S.C. section 360bbb-3(b)(1), unless the authorization is terminated or revoked sooner. Performed at [facility], [place] [address], [place], [place] [address]        Radiology Studies: Ct Head Wo Contrast  Result Date: 09/27/2019 CLINICAL DATA:  Altered level of consciousness EXAM: CT HEAD WITHOUT CONTRAST TECHNIQUE: Contiguous axial images were obtained from the base of the skull through the vertex without intravenous contrast. COMPARISON:  February 25, 2019 FINDINGS: Brain: No evidence of acute territorial infarction, hemorrhage, hydrocephalus,extra-axial collection or mass lesion/mass effect. There is dilatation the ventricles and sulci consistent with age-related atrophy. Low-attenuation changes in the deep white matter consistent with small vessel ischemia. Vascular: No hyperdense vessel or unexpected calcification. Skull: The skull is intact. No fracture or focal lesion identified. Sinuses/Orbits: The visualized paranasal sinuses and mastoid air cells are clear. The orbits and globes intact. Other: None IMPRESSION: No acute intracranial abnormality. Findings consistent with age related atrophy and  chronic small vessel ischemia Electronically Signed   By: Prudencio Pair M.D.   On: 09/27/2019 22:28   Ct Angio Chest Pe W And/or Wo Contrast  Result Date: 09/27/2019 CLINICAL DATA:  Sudden onset upper extremity tremors and fixed left-sided gaze for 10 seconds. EXAM: CT ANGIOGRAPHY CHEST WITH CONTRAST TECHNIQUE: Multidetector CT imaging of the chest was performed using the standard protocol during bolus administration of intravenous contrast. Multiplanar CT image reconstructions and MIPs were obtained to evaluate the vascular anatomy. CONTRAST:  10mL OMNIPAQUE IOHEXOL 350 MG/ML SOLN COMPARISON:  Chest x-ray from same day. CT chest dated February 28, 2016. FINDINGS: Cardiovascular: Satisfactory opacification of the pulmonary arteries to the segmental level. Positive for acute pulmonary emboli with saddle pulmonary embolus and bilateral lobar and segmental pulmonary emboli involving all lobes. Elevated RV/LV ratio of 2.7 normal heart size. No pericardial effusion. No thoracic  aortic aneurysm. Coronary, aortic arch, and branch vessel atherosclerotic vascular disease. Mediastinum/Nodes: No enlarged mediastinal, hilar, or axillary lymph nodes. Thyroid gland, trachea, and esophagus demonstrate no significant findings. Lungs/Pleura: Scattered parenchymal scarring corresponding to sites of prior infection/inflammation seen on prior chest CT from 2017. Mild mosaic perfusion. No focal consolidation, pleural effusion, or pneumothorax. Left lower lobe peribronchial thickening. No suspicious pulmonary nodule. Upper Abdomen: No acute abnormality. Musculoskeletal: No chest wall abnormality. No acute or significant osseous findings. T2 and T8 vertebral body hemangiomas. Review of the MIP images confirms the above findings. IMPRESSION: 1. Positive for acute PE with CT evidence of right heart strain (RV/LV Ratio = 2.7) consistent with at least submassive (intermediate risk) PE. The presence of right heart strain has been associated with an increased risk of morbidity and mortality. Please activate Code PE by paging [phone]. 2.  Aortic atherosclerosis (ICD10-I70.0). Critical Value/emergent results were called by telephone at the time of interpretation on 09/27/2019 at 11:26 pm to providerDAVID YAO , who verbally acknowledged these results. Electronically Signed   By: Titus Dubin M.D.   On: 09/27/2019 23:28   [name] Contrast  Result Date: 09/28/2019 CLINICAL DATA:  TIA.  Left-sided weakness EXAM: MRI HEAD WITHOUT CONTRAST TECHNIQUE: Multiplanar, multiecho pulse sequences of the brain and surrounding structures were obtained without intravenous contrast. COMPARISON:  Head CT from yesterday.  Brain MRI 01/02/2017 FINDINGS: Brain: No acute infarction, hemorrhage, hydrocephalus, extra-axial collection or mass lesion. Severe brain atrophy, especially notable in the frontal and temporal lobes. Confluent ischemic gliosis in the deep cerebral white matter with remote cortically based infarcts  along the high right frontal convexity. Vascular: Major flow voids are preserved Skull and upper cervical spine: Negative for marrow lesion Sinuses/Orbits: No acute finding Other: Significant motion degradation. IMPRESSION: 1. Motion degraded study without acute finding. 2. Advanced frontotemporal atrophy. 3. Extensive chronic small vessel ischemia. Electronically Signed   By: Monte Fantasia M.D.   On: 09/28/2019 06:07   Dg Chest Port 1 View  Result Date: 09/27/2019 CLINICAL DATA:  Cough EXAM: PORTABLE CHEST 1 VIEW COMPARISON:  February 25, 2019 FINDINGS: Unchanged cardiomediastinal silhouette. Aortic knob calcifications. Both lungs are clear. The visualized skeletal structures are unremarkable. IMPRESSION: No acute cardiopulmonary process. Electronically Signed   By: Prudencio Pair M.D.   On: 09/27/2019 22:13   Vas [place] Lower Extremity Venous (dvt)  Result Date: 09/28/2019  Lower Venous Study Indications: Pulmonary embolism.  Performing Technologist: Abram Sander RVS  Examination Guidelines: A complete evaluation includes B-mode imaging, spectral Doppler, color Doppler, and power Doppler as needed of all accessible portions of each vessel. Bilateral  testing is considered an integral part of a complete examination. Limited examinations for reoccurring indications may be performed as noted.  +---------+---------------+---------+-----------+----------+--------------+  RIGHT     Compressibility Phasicity Spontaneity Properties Thrombus Aging  +---------+---------------+---------+-----------+----------+--------------+  CFV       Full            Yes       Yes                                    +---------+---------------+---------+-----------+----------+--------------+  SFJ       Full                                                             +---------+---------------+---------+-----------+----------+--------------+  FV Prox   Full                                                              +---------+---------------+---------+-----------+----------+--------------+  FV Mid    Full                                                             +---------+---------------+---------+-----------+----------+--------------+  FV Distal Full                                                             +---------+---------------+---------+-----------+----------+--------------+  PFV       Full                                                             +---------+---------------+---------+-----------+----------+--------------+  POP       Full            Yes       Yes                                    +---------+---------------+---------+-----------+----------+--------------+  PTV       Full                                                             +---------+---------------+---------+-----------+----------+--------------+  PERO  Not visualized  +---------+---------------+---------+-----------+----------+--------------+   +---------+---------------+---------+-----------+----------+--------------+  LEFT      Compressibility Phasicity Spontaneity Properties Thrombus Aging  +---------+---------------+---------+-----------+----------+--------------+  CFV       Full            Yes       Yes                                    +---------+---------------+---------+-----------+----------+--------------+  SFJ       Full                                                             +---------+---------------+---------+-----------+----------+--------------+  FV Prox   Full                                                             +---------+---------------+---------+-----------+----------+--------------+  FV Mid    Full                                                             +---------+---------------+---------+-----------+----------+--------------+  FV Distal Full                                                              +---------+---------------+---------+-----------+----------+--------------+  PFV       Full                                                             +---------+---------------+---------+-----------+----------+--------------+  POP       Full            Yes       Yes                                    +---------+---------------+---------+-----------+----------+--------------+  PTV       Full                                                             +---------+---------------+---------+-----------+----------+--------------+  PERO  Not visualized  +---------+---------------+---------+-----------+----------+--------------+     Summary: Right: There is no evidence of deep vein thrombosis in the lower extremity. No cystic structure found in the popliteal fossa. Left: There is no evidence of deep vein thrombosis in the lower extremity. No cystic structure found in the popliteal fossa.  *See table(s) above for measurements and observations. Electronically signed by Monica Martinez MD on 09/28/2019 at 5:15:09 PM.    Final       Scheduled Meds:  atorvastatin  20 mg Oral q1800   donepezil  10 mg Oral QHS   insulin aspart  0-9 Units Subcutaneous TID WC   insulin glargine  20 Units Subcutaneous Daily   Continuous Infusions:  cefTRIAXone (ROCEPHIN)  IV Stopped (09/28/19 1042)   heparin 1,000 Units/hr (09/28/19 1923)     LOS: 1 day      Time spent: 25 minutes   Dessa Phi, DO [facility] 09/29/2019, 8:56 AM   Available via Epic secure chat 7am-7pm After these hours, please refer to coverage provider listed on [email or web address]

## 2019-09-29 NOTE — Plan of Care (Signed)
Pt. Was seen with PT for OT evaluation. Pt. Requires extensive assist with ADLs and mbolity. Unsure what patiet PLOF was and if family can assist at current level. Pt. Will need 24 hour care at d/c. Pt. May need SNF if family is unable to provide. Acute OT to follow.

## 2019-09-29 NOTE — Evaluation (Signed)
**Note [name]-Identified via Obfuscation** Occupational Therapy Evaluation Patient Details Name: [name] [name] MRN: [id number] DOB: May 08, 1926 Today's Date: 09/29/2019    History of Present Illness [name] [name] is a 85 year old male with past medical history significant for chronic atrial fibrillation not on anticoagulation, type 2 diabetes, chronic kidney disease stage III, history of CVA with chronic left-sided weakness, chronic diastolic heart failure, COPD, hypertension, dementia, OSA not on CPAP who presents with sudden tremor of upper extremity, fixed left gaze for around 10 seconds.  Upon EMS arrival, they did not note any neuro deficits.  Patient does have history of CVA with chronic left-sided weakness.  Patient had some altered mental status for about 3 to 4 minutes after the event, no history of seizures in the past.  In the emergency department, CTA chest revealed bilateral saddle PE.   Clinical Impression          Pt. Was seen with PT for OT evaluation. Pt. Requires extensive assist with ADLs and mbolity. Unsure what patiet PLOF was and if family can assist at current level. Pt. Will need 24 hour care at d/c. Pt. May need SNF if family is unable to provide. Acute OT to follow.           Follow Up Recommendations  SNF    Equipment Recommendations       Recommendations for Other Services       Precautions / Restrictions Precautions Precautions: Fall Precaution Comments: pt reports he has fallen a lot.  asked me to bring 2 big men to help get him up Restrictions Weight Bearing Restrictions: No Other Position/Activity Restrictions: Pt with IV and foley intact      Mobility Bed Mobility Overal bed mobility: Needs Assistance Bed Mobility: Supine to Sit     Supine to sit: Mod assist     General bed mobility comments: pt helped to move his legs to edge of bed and then needed mod assist to help get his trunk off the bed.  pt then was abel to scoot forward - although needed max cues - he wanted to hold  onto me for su pport to pull forward  Transfers Overall transfer level: Needs assistance Equipment used: Rolling walker (2 wheeled) Transfers: Sit to/from Stand Sit to Stand: Mod assist;+2 physical assistance;From elevated surface         General transfer comment: Pt did 3 stands from EOB with RW - pt did well getting up but then balance was posterior to his feet - pt side stepped to get up higher in the bed but then would loose balance backward and sit back on the bed.  Pt wtih poor safety awareness    Balance Overall balance assessment: Needs assistance   Sitting balance-Leahy Scale: Poor Sitting balance - Comments: unsure if bed not even or pt - pt loosing balance to the left today when sitting     Standing balance-Leahy Scale: Poor Standing balance comment: pt loosing balance posterior when stnading - hard time getting his COG over his BOS                           ADL either performed or assessed with clinical judgement   ADL Overall ADL's : Needs assistance/impaired Eating/Feeding: Modified independent   Grooming: Wash/dry hands;Wash/dry face;Moderate assistance   Upper Body Bathing: Moderate assistance;Sitting   Lower Body Bathing: Total assistance;+2 for safety/equipment;+2 for physical assistance   Upper Body Dressing : Moderate assistance   Lower Body Dressing:  Total assistance;+2 for physical assistance   Toilet Transfer: Moderate assistance;+2 for physical assistance   Toileting- Clothing Manipulation and Hygiene: Total assistance;+2 for physical assistance       Functional mobility during ADLs: Moderate assistance;Rolling walker General ADL Comments: Pt. needed extensive assist for ADLs     Vision Baseline Vision/History: (unknown) Patient Visual Report: (unalbe to read sign across room. able to read large print )       Perception     Praxis      Pertinent Vitals/Pain Pain Assessment: No/denies pain Pain Intervention(s): Monitored  during session     Hand Dominance Right   Extremity/Trunk Assessment Upper Extremity Assessment Upper Extremity Assessment: Generalized weakness   Lower Extremity Assessment Lower Extremity Assessment: Generalized weakness(pt has history of left side weakness - pt abel to move left leg in the bed.  not formally tested)   Cervical / Trunk Assessment Cervical / Trunk Assessment: Kyphotic   Communication Communication Communication: HOH   Cognition Arousal/Alertness: Awake/alert Behavior During Therapy: Impulsive Overall Cognitive Status: No family/caregiver present to determine baseline cognitive functioning                                 General Comments: pt didnt know where he was or why - thougth he was on a ship.  pt also reports he lives with his parents (pt is [name] [name])   General Comments  pts IV was leaking when pt sat up - nursing called in to look at it.  this is why we didnt get pt up in chair at this time.    Exercises     Shoulder Instructions      Home Living Family/patient expects to be discharged to:: Private residence Living Arrangements: Children                               Additional Comments: Pt reports he lives with his parents (he is 31) and is now on a ship.  no information about his home obtained from pt.  no family present      Prior Functioning/Environment          Comments: (poor historian)        OT Problem List: Decreased strength;Decreased activity tolerance;Decreased knowledge of use of DME or AE      OT Treatment/Interventions: Self-care/ADL training;DME and/or AE instruction;Therapeutic activities    OT Goals(Current goals can be found in the care plan section) Acute Rehab OT Goals Patient Stated Goal: none stated OT Goal Formulation: Patient unable to participate in goal setting Time For Goal Achievement: 10/13/19 Potential to Achieve Goals: Fair  OT Frequency: Min 2X/week   Barriers to D/C:             Co-evaluation PT/OT/SLP Co-Evaluation/Treatment: Yes Reason for Co-Treatment: Necessary to address cognition/behavior during functional activity PT goals addressed during session: Mobility/safety with mobility OT goals addressed during session: ADL's and self-care      AM-PAC OT "6 Clicks" Daily Activity     Outcome Measure Help from another person eating meals?: A Little Help from another person taking care of personal grooming?: A Little Help from another person toileting, which includes using toliet, bedpan, or urinal?: Total Help from another person bathing (including washing, rinsing, drying)?: A Lot Help from another person to put on and taking off regular upper body clothing?: A Lot Help from another person to  put on and taking off regular lower body clothing?: Total 6 Click Score: 12   End of Session Equipment Utilized During Treatment: Gait belt;Rolling walker Nurse Communication: (iv was bleeding)  Activity Tolerance: Patient tolerated treatment well Patient left: in bed;with call bell/phone within reach;with bed alarm set;with nursing/sitter in room  OT Visit Diagnosis: Unsteadiness on feet (R26.81)                Time: [id number] OT Time Calculation (min): 30 min Charges:  OT General Charges $OT Visit: 1 Visit OT Evaluation $OT Eval Moderate Complexity: 1 Mod 6 clicks  Myquan Schaumburg 09/29/2019, 1:30 PM

## 2019-09-29 NOTE — Progress Notes (Signed)
[facility] for heparin Indication: pulmonary embolus  Allergies  Allergen Reactions  . Sulfa Antibiotics Swelling    Swelling of hands    Patient Measurements: Height: 5\' 8"  (172.7 cm) Weight: 160 lb (72.6 kg) IBW/kg (Calculated) : 68.4  Vital Signs: Temp: 97.5 F (36.4 C) (10/09 1112) Temp Source: Oral (10/09 1112) BP: 131/86 (10/09 1112) Pulse Rate: 77 (10/09 1112)  Labs: Recent Labs    09/27/19 2211 09/27/19 2219 09/28/19 0059 09/28/19 0417 09/28/19 0653 09/28/19 0750 09/28/19 1700 09/29/19 0418  HGB 13.2 13.6  --  12.8*  --   --   --  12.7*  HCT 39.7 40.0  --  39.1  --   --   --  38.2*  PLT 192  --   --  164  --   --   --  144*  HEPARINUNFRC  --   --   --   --   --  0.72* 0.72* 0.66  CREATININE 1.50* 1.30*  --  1.40*  --   --   --  1.17  TROPONINIHS 32*  --  94* 139* 125*  --   --   --     Estimated Creatinine Clearance: 39.8 mL/min (by C-G formula based on SCr of 1.17 mg/dL).  Assessment: 85 yo male with bilateral PE on heparin. Plans noted for oral AC in 3-5 days -heparin level at goal -hg= 12.7, plt= 144  Goal of Therapy:  Heparin level 0.3-0.7 units/ml Monitor platelets by anticoagulation protocol: Yes   Plan:  -No heparin changes needed -Daily heparin level and CBC  Hildred Laser, PharmD Clinical Pharmacist **Pharmacist phone directory can now be found on [email or web address] ([email or web address]).  Listed under [place].

## 2019-09-29 NOTE — Progress Notes (Signed)
Patient admitted from [facility] ED to [place] 23.CHG done VS checked,attached to cardiac monitoring,CCMD notified.Oriented to the room and staff,with ongoing heparin drip,pt is not on pain.Will continue to monitor.

## 2019-09-30 LAB — CBC
HCT: 36.4 % — ABNORMAL LOW (ref 39.0–52.0)
Hemoglobin: 12.6 g/dL — ABNORMAL LOW (ref 13.0–17.0)
MCH: 32.9 pg (ref 26.0–34.0)
MCHC: 34.6 g/dL (ref 30.0–36.0)
MCV: 95 fL (ref 80.0–100.0)
Platelets: 141 10*3/uL — ABNORMAL LOW (ref 150–400)
RBC: 3.83 MIL/uL — ABNORMAL LOW (ref 4.22–5.81)
RDW: 13.4 % (ref 11.5–15.5)
WBC: 8.7 10*3/uL (ref 4.0–10.5)
nRBC: 0 % (ref 0.0–0.2)

## 2019-09-30 LAB — URINE CULTURE: Culture: >=100000 — AB

## 2019-09-30 LAB — BASIC METABOLIC PANEL
Anion gap: 10 (ref 5–15)
BUN: 15 mg/dL (ref 8–23)
CO2: 23 mmol/L (ref 22–32)
Calcium: 8.9 mg/dL (ref 8.9–10.3)
Chloride: 105 mmol/L (ref 98–111)
Creatinine, Ser: 1.35 mg/dL — ABNORMAL HIGH (ref 0.61–1.24)
GFR calc Af Amer: 53 mL/min — ABNORMAL LOW (ref >60)
GFR calc non Af Amer: 46 mL/min — ABNORMAL LOW (ref >60)
Glucose, Bld: 170 mg/dL — ABNORMAL HIGH (ref 70–99)
Potassium: 3.7 mmol/L (ref 3.5–5.1)
Sodium: 138 mmol/L (ref 135–145)

## 2019-09-30 LAB — GLUCOSE, CAPILLARY
Glucose-Capillary: 135 mg/dL — ABNORMAL HIGH (ref 70–99)
Glucose-Capillary: 211 mg/dL — ABNORMAL HIGH (ref 70–99)
Glucose-Capillary: 191 mg/dL — ABNORMAL HIGH (ref 70–99)
Glucose-Capillary: 158 mg/dL — ABNORMAL HIGH (ref 70–99)

## 2019-09-30 LAB — HEPARIN LEVEL (UNFRACTIONATED): Heparin Unfractionated: 0.52 IU/mL (ref 0.30–0.70)

## 2019-09-30 NOTE — Progress Notes (Signed)
[facility] for heparin Indication: pulmonary embolus  Allergies  Allergen Reactions  . Sulfa Antibiotics Swelling    Swelling of hands    Patient Measurements: Height: 5\' 8"  (172.7 cm) Weight: 160 lb (72.6 kg) IBW/kg (Calculated) : 68.4  Vital Signs: Temp: 97.9 F (36.6 C) (10/10 0411) Temp Source: Oral (10/10 0411) BP: 134/87 (10/10 0411) Pulse Rate: 82 (10/09 1955)  Labs: Recent Labs    09/28/19 0059 09/28/19 0417 09/28/19 0653  09/28/19 1700 09/29/19 0418 09/30/19 0322  HGB  --  12.8*  --   --   --  12.7* 12.6*  HCT  --  39.1  --   --   --  38.2* 36.4*  PLT  --  164  --   --   --  144* 141*  HEPARINUNFRC  --   --   --    < > 0.72* 0.66 0.52  CREATININE  --  1.40*  --   --   --  1.17 1.35*  TROPONINIHS 94* 139* 125*  --   --   --   --    < > = values in this interval not displayed.    Estimated Creatinine Clearance: 34.5 mL/min (A) (by C-G formula based on SCr of 1.35 mg/dL (H)).  Assessment: 85 yo male history of Afib now with bilateral PE on heparin. No AC PTA. Plans noted for oral AC in 3-5 days.  Heparin level therapeutic at 0.52 today on drip rate 1000 units/hr. CBC stable. No overt bleeding or infusion issues noted.   Goal of Therapy:  Heparin level 0.3-0.7 units/ml Monitor platelets by anticoagulation protocol: Yes   Plan:  - Continue heparin at 1000 units/hr - Daily heparin level and CBC - Monitor for bleeding  Richardine Service, PharmD PGY1 Pharmacy Resident Phone: [phone] 09/30/2019  7:39 AM  Please check [email or web address] for unit-specific pharmacy phone numbers.

## 2019-09-30 NOTE — TOC Initial Note (Addendum)
Transition of Care [facility]) - Initial/Assessment Note    Patient Details  Name: [name] [name] MRN: [id number] Date of Birth: 04/30/1926  [facility] CM/SW Contact:    Bary Castilla, LCSW Phone Number: [phone] 09/30/2019, 11:10 AM  Clinical Narrative:                  CSW spoke with patient's daughter Loletha Carrow via phone to discuss the recommendation of a ST SNF. Vickie informed CSW that she was aware of recommendation and she and her brother were in agreement with patient going to a SNF. Vickie informed CSW that patient has been a resident of [facility] and could return there if they made a bed offer. Vickie was agreeable for CSW to fax out referrals to local facilities. CSW provided Vickie with medicare.gov rating list via email. CSW informed Vickie that follow up would occur once bed offers were received.  CSW will continue to follow for discharge planning.   Expected Discharge Plan: Skilled Nursing Facility Barriers to Discharge: Insurance Authorization, SNF Pending bed offer   Patient Goals and CMS Choice   CMS Medicare.gov Compare Post Acute Care list provided to:: Patient Represenative (must comment)(Daughter) Choice offered to / list presented to : Adult Children  Expected Discharge Plan and Services Expected Discharge Plan: [place] arrangements for the past 2 months: Single Family Home                                      Prior Living Arrangements/Services Living arrangements for the past 2 months: Single Family Home Lives with:: Self, Adult Children Patient language and need for interpreter reviewed:: Yes                 Activities of Daily Living      Permission Sought/Granted      Share Information with NAME: Vickie  Permission granted to share info w AGENCY: SNFs  Permission granted to share info w Relationship: Daughter  Permission granted to share info w Contact Information: [phone]  Emotional Assessment   Attitude/Demeanor/Rapport: Unable to Assess Affect (typically observed): Unable to Assess Orientation: : Oriented to Self, Oriented to Place      Admission diagnosis:  Acute saddle pulmonary embolism with acute cor pulmonale (HCC) [I26.02] Patient Active Problem List   Diagnosis Date Noted  . Pulmonary embolism ([place]) 09/28/2019  . CKD (chronic kidney disease), stage III 02/25/2019  . Chronic diastolic CHF (congestive heart failure) ([place]) 02/25/2019  . Gastroenteritis 10/06/2017  . Hypokalemia 10/02/2017  . Dehydration 10/02/2017  . Syncope 10/02/2017  . Cerebral embolism with cerebral infarction 01/04/2017  . Dementia without behavioral disturbance ([place])   . Falls 01/03/2017  . Generalized weakness 01/01/2017  . Frequent falls 01/01/2017  . Acute hypoxemic respiratory failure ([place]) 04/09/2016  . COPD (chronic obstructive pulmonary disease) with chronic bronchitis ([place]) 03/12/2016  . HCAP (healthcare-associated pneumonia) 02/26/2016  . Elevated troponin 02/26/2016  . Elevated lactic acid level 01/28/2016  . Sepsis ([place]) 01/28/2016  . Acute renal failure superimposed on stage 3 chronic kidney disease ([place]) 04/21/2015  . Hyperglycemia 04/21/2015  . Bronchitis 04/21/2015  . Tachycardia 04/21/2015  . BPH (benign prostatic hyperplasia) 04/21/2015  . Dyspnea 12/28/2012  . Fall 12/28/2012  . OTHER DYSPNEA AND RESPIRATORY ABNORMALITIES 05/27/2010  . Hyperlipidemia 11/27/2008  . HYPERTENSION, BENIGN 11/27/2008  .  Atrial fibrillation, chronic 11/27/2008  . GERD 11/27/2008  . BPH/LUTS W/O OBSTRUCTION 11/27/2008  . ARTHRITIS 11/27/2008   PCP:  Haywood Pao, MD Pharmacy:   [id number] [address], [place] [address] Phone: [phone] Fax: [phone]     Social Determinants of Health (SDOH) Interventions    Readmission Risk Interventions No flowsheet data found.

## 2019-09-30 NOTE — NC FL2 (Signed)
[place] LEVEL OF CARE SCREENING TOOL     IDENTIFICATION  Patient Name: [name] [name] Birthdate: 1926/02/02 Sex: male Admission Date (Current Location): 09/27/2019  [facility] and [place] Number:  Herbalist and Address:  [facility], [place] [address] [place], [place] [address]      Provider Number: [id number]  Attending Physician Name and Address:  Dessa Phi, DO  Relative Name and Phone Number:  [phone]    Current Level of Care: Hospital Recommended Level of Care: [place] Prior Approval Number:    Date Approved/Denied:   PASRR Number: [id number]  Discharge Plan: SNF    Current Diagnoses: Patient Active Problem List   Diagnosis Date Noted  . Pulmonary embolism ([place]) 09/28/2019  . CKD (chronic kidney disease), stage III 02/25/2019  . Chronic diastolic CHF (congestive heart failure) ([place]) 02/25/2019  . Gastroenteritis 10/06/2017  . Hypokalemia 10/02/2017  . Dehydration 10/02/2017  . Syncope 10/02/2017  . Cerebral embolism with cerebral infarction 01/04/2017  . Dementia without behavioral disturbance ([place])   . Falls 01/03/2017  . Generalized weakness 01/01/2017  . Frequent falls 01/01/2017  . Acute hypoxemic respiratory failure ([place]) 04/09/2016  . COPD (chronic obstructive pulmonary disease) with chronic bronchitis ([place]) 03/12/2016  . HCAP (healthcare-associated pneumonia) 02/26/2016  . Elevated troponin 02/26/2016  . Elevated lactic acid level 01/28/2016  . Sepsis ([place]) 01/28/2016  . Acute renal failure superimposed on stage 3 chronic kidney disease ([place]) 04/21/2015  . Hyperglycemia 04/21/2015  . Bronchitis 04/21/2015  . Tachycardia 04/21/2015  . BPH (benign prostatic hyperplasia) 04/21/2015  . Dyspnea 12/28/2012  . Fall 12/28/2012  . OTHER DYSPNEA AND RESPIRATORY ABNORMALITIES 05/27/2010  . Hyperlipidemia 11/27/2008  . HYPERTENSION, BENIGN 11/27/2008  . Atrial fibrillation,  chronic 11/27/2008  . GERD 11/27/2008  . BPH/LUTS W/O OBSTRUCTION 11/27/2008  . ARTHRITIS 11/27/2008    Orientation RESPIRATION BLADDER Height & Weight     Self, Time  O2 Continent, Incontinent, External catheter Weight: 160 lb (72.6 kg) Height:  5\' 8"  (172.7 cm)  BEHAVIORAL SYMPTOMS/MOOD NEUROLOGICAL BOWEL NUTRITION STATUS      Continent Diet(carb modified)  AMBULATORY STATUS COMMUNICATION OF NEEDS Skin   Extensive Assist Verbally Surgical wounds(right heel)                       Personal Care Assistance Level of Assistance  Bathing, Feeding, Dressing Bathing Assistance: Maximum assistance Feeding assistance: Limited assistance Dressing Assistance: Maximum assistance     Functional Limitations Info  Sight, Hearing, Speech Sight Info: Adequate Hearing Info: Adequate Speech Info: Adequate    SPECIAL CARE FACTORS FREQUENCY  PT (By licensed PT), OT (By licensed OT)     PT Frequency: 5x per week OT Frequency: 5x per week            Contractures Contractures Info: Not present    Additional Factors Info  Code Status, Allergies, Insulin Sliding Scale Code Status Info: DNR Allergies Info: Sulfa Antibiotics   Insulin Sliding Scale Info: insulin aspart (novoLOG) injection 0-9 Units,lin glargine (LANTUS) injection 20 Units daily       Current Medications (09/30/2019):  This is the current hospital active medication list Current Facility-Administered Medications  Medication Dose Route Frequency Provider Last Rate Last Dose  . acetaminophen (TYLENOL) tablet 650 mg  650 mg Oral Q6H PRN Doutova, Anastassia, MD       Or  . acetaminophen (TYLENOL) suppository 650 mg  650 mg Rectal Q6H  PRN Toy Baker, MD      . atorvastatin (LIPITOR) tablet 20 mg  20 mg Oral q1800 Doutova, Anastassia, MD   20 mg at 09/29/19 1709  . donepezil (ARICEPT) tablet 10 mg  10 mg Oral QHS Doutova, Anastassia, MD   10 mg at 09/29/19 2145  . heparin ADULT infusion 100 units/mL (25000  units/229mL sodium chloride 0.45%)  1,000 Units/hr Intravenous Continuous Rolla Flatten, RPH 10 mL/hr at 09/29/19 1715 1,000 Units/hr at 09/29/19 1715  . HYDROcodone-acetaminophen (NORCO/VICODIN) 5-325 MG per tablet 1-2 tablet  1-2 tablet Oral Q4H PRN Doutova, Anastassia, MD      . insulin aspart (novoLOG) injection 0-9 Units  0-9 Units Subcutaneous TID WC Dessa Phi, DO   1 Units at 09/30/19 0640  . insulin glargine (LANTUS) injection 20 Units  20 Units Subcutaneous Daily Dessa Phi, DO   20 Units at 09/30/19 1038  . ondansetron (ZOFRAN) tablet 4 mg  4 mg Oral Q6H PRN Doutova, Anastassia, MD       Or  . ondansetron (ZOFRAN) injection 4 mg  4 mg Intravenous Q6H PRN Toy Baker, MD         Discharge Medications: Please see discharge summary for a list of discharge medications.  Relevant Imaging Results:  Relevant Lab Results:   Additional Information [id number] [place], [place]

## 2019-09-30 NOTE — Plan of Care (Signed)

## 2019-09-30 NOTE — Progress Notes (Signed)
PROGRESS NOTE    [name] [name]  [id number] DOB: February 27, 1926 DOA: 09/27/2019 PCP: Haywood Pao, MD     Brief Narrative:  [name] [name] is a 85 year old male with past medical history significant for chronic atrial fibrillation not on anticoagulation, type 2 diabetes, chronic kidney disease stage III, history of CVA with chronic left-sided weakness, chronic diastolic heart failure, COPD, hypertension, dementia, OSA not on CPAP who presents with sudden tremor of upper extremity, fixed left gaze for around 10 seconds.  Upon EMS arrival, they did not note any neuro deficits.  Patient does have history of CVA with chronic left-sided weakness.  Patient had some altered mental status for about 3 to 4 minutes after the event, no history of seizures in the past.  In the emergency department, CTA chest revealed bilateral saddle PE.  He was started on IV heparin, critical care consulted.   New events last 24 hours / Subjective: Doing well today.  Denies any chest pain or shortness of breath.  Some nausea without vomiting.  Assessment & Plan:   Principal Problem:   Pulmonary embolism (HCC) Active Problems:   Hyperlipidemia   HYPERTENSION, BENIGN   Atrial fibrillation, chronic   GERD   Hyperglycemia   Elevated troponin   COPD (chronic obstructive pulmonary disease) with chronic bronchitis (HCC)   Dementia without behavioral disturbance (HCC)   CKD (chronic kidney disease), stage III   Chronic diastolic CHF (congestive heart failure) ([place])   Acute bilateral saddle PE -PCCM consulted, did not recommend thrombolytic.  Signed off 10/8 -Echocardiogram EF 60-65%, normal LV function, global right ventricle with moderate reduced systolic function -Venous duplex Dopplers negative for DVT  -Continue IV heparin for 3 to 5 days, consider transition to oral anticoagulant at that time  Concern for seizure activity -Discussed with neurology at the time of admission, they recommended MRI, echo,  EEG.  If abnormal, plan for formal neurologic consultation.  Hold off on antiepileptics for now as patient has no history of seizures and this was not a definite seizure -MRI: motion degraded study without acute finding -EEG: no seizures or epileptiform discharges were seen throughout the recording. -No further events while in hospital   Demand ischemia -Secondary to PE, peaked at 139 and trended downward -Echocardiogram without wall motion abnormality   Chronic atrial fibrillation -CHA2DS2-VASc score 5 -IV heparin -Telemetry   Hyperlipidemia -Continue Lipitor  Dementia -Appears to be stable for now -Continue Aricept  Chronic kidney disease stage III -Baseline creatinine 1.4-1.7 -Stable  Chronic diastolic heart failure -Euvolemic at this time.  BNP 154 -Holding Toprol, patient had episode of nonsustained bradycardia on admission   Asymptomatic pyuria -Urine culture positive for Kocuria Rosea > 100,000 -Discussed with Dr. Linus Salmons ID, patient without infectious symptoms, would not treat with antibiotics at this time. Stop Rocephin.   Type 2 diabetes with hyperglycemia -Hold Amaryl -Lantus, sliding scale insulin    DVT prophylaxis: IV heparin Code Status: DNR Family Communication: None  Disposition Plan: Continue IV heparin for now.  SNF placement pending   Consultants:   PCCM  Procedures:   None   Antimicrobials:  Anti-infectives (From admission, onward)   Start     Dose/Rate Route Frequency Ordered Stop   09/28/19 1000  cefTRIAXone (ROCEPHIN) 1 g in sodium chloride 0.9 % 100 mL IVPB     1 g 200 mL/hr over 30 Minutes Intravenous Every 24 hours 09/28/19 0839         Objective: Vitals:   09/29/19 1616 09/29/19  1955 09/29/19 2336 09/30/19 0411  BP: 131/86 139/80 113/78 134/87  Pulse: 98 82    Resp: 17 18 20 18   Temp: 97.9 F (36.6 C) 98 F (36.7 C) 98.1 F (36.7 C) 97.9 F (36.6 C)  TempSrc: Oral Oral Oral Oral  SpO2: 100% 99% 98% 98%  Weight:        Height:        Intake/Output Summary (Last 24 hours) at 09/30/2019 0939 Last data filed at 09/30/2019 0522 Gross per 24 hour  Intake 677.2 ml  Output 1200 ml  Net -522.8 ml   Filed Weights   09/27/19 2146  Weight: 72.6 kg    Examination: General exam: Appears calm and comfortable, some confusion Respiratory system: Clear to auscultation. Respiratory effort normal.  Without distress on 3 L nasal cannula O2 Cardiovascular system: S1 & S2 heard, irregular rhythm, rate 80s. No JVD, murmurs, rubs, gallops or clicks. No pedal edema. Gastrointestinal system: Abdomen is nondistended, soft and nontender. Normal bowel sounds heard. Central nervous system: Alert. No focal neurological deficits. Extremities: Symmetric  Skin: No rashes, lesions or ulcers on exposed skin Psychiatry: Dementia  Data Reviewed: I have personally reviewed following labs and imaging studies  CBC: Recent Labs  Lab 09/27/19 2211 09/27/19 2219 09/28/19 0417 09/29/19 0418 09/30/19 0322  WBC 15.4*  --  11.3* 10.4 8.7  NEUTROABS 13.2*  --   --   --   --   HGB 13.2 13.6 12.8* 12.7* 12.6*  HCT 39.7 40.0 39.1 38.2* 36.4*  MCV 97.1  --  98.2 96.7 95.0  PLT 192  --  164 144* [id number]*   Basic Metabolic Panel: Recent Labs  Lab 09/27/19 2211 09/27/19 2219 09/28/19 0417 09/29/19 0418 09/30/19 0322  NA 135 137 136 139 138  K 4.9 4.8 3.9 3.9 3.7  CL 104 104 102 105 105  CO2 20*  --  24 22 23   GLUCOSE 351* 350* 284* 148* 170*  BUN 21 28* 18 14 15   CREATININE 1.50* 1.30* 1.40* 1.17 1.35*  CALCIUM 8.8*  --  8.9 9.0 8.9  MG 2.0  --  2.1  --   --   PHOS  --   --  3.5  --   --    GFR: Estimated Creatinine Clearance: 34.5 mL/min (A) (by C-G formula based on SCr of 1.35 mg/dL (H)). Liver Function Tests: Recent Labs  Lab 09/27/19 2211 09/28/19 0417  AST 33 17  ALT 22 22  ALKPHOS 101 98  BILITOT 1.7* 0.9  PROT 6.4* 6.4*  ALBUMIN 3.4* 3.1*   No results for input(s): LIPASE, AMYLASE in the last 168 hours. No  results for input(s): AMMONIA in the last 168 hours. Coagulation Profile: No results for input(s): INR, PROTIME in the last 168 hours. Cardiac Enzymes: No results for input(s): CKTOTAL, CKMB, CKMBINDEX, TROPONINI in the last 168 hours. BNP (last 3 results) No results for input(s): PROBNP in the last 8760 hours. HbA1C: Recent Labs    09/28/19 0417  HGBA1C 8.9*   CBG: Recent Labs  Lab 09/29/19 0620 09/29/19 1111 09/29/19 1620 09/29/19 2132 09/30/19 0639  GLUCAP 131* 163* 193* 147* 135*   Lipid Profile: No results for input(s): CHOL, HDL, LDLCALC, TRIG, CHOLHDL, LDLDIRECT in the last 72 hours. Thyroid Function Tests: Recent Labs    09/28/19 0417  TSH 2.066   Anemia Panel: No results for input(s): VITAMINB12, FOLATE, FERRITIN, TIBC, IRON, RETICCTPCT in the last 72 hours. Sepsis Labs: No results for input(s): PROCALCITON, LATICACIDVEN  in the last 168 hours.  Recent Results (from the past 240 hour(s))  SARS Coronavirus 2 [facility] order, Performed in [facility] hospital lab) Nasopharyngeal Nasopharyngeal Swab     Status: None   Collection Time: 09/27/19 10:05 PM   Specimen: Nasopharyngeal Swab  Result Value Ref Range Status   SARS Coronavirus 2 NEGATIVE NEGATIVE Final    Comment: (NOTE) If result is NEGATIVE SARS-CoV-2 target nucleic acids are NOT DETECTED. The SARS-CoV-2 RNA is generally detectable in upper and lower  respiratory specimens during the acute phase of infection. The lowest  concentration of SARS-CoV-2 viral copies this assay can detect is 250  copies / mL. A negative result does not preclude SARS-CoV-2 infection  and should not be used as the sole basis for treatment or other  patient management decisions.  A negative result may occur with  improper specimen collection / handling, submission of specimen other  than nasopharyngeal swab, presence of viral mutation(s) within the  areas targeted by this assay, and inadequate number of viral copies  (<250  copies / mL). A negative result must be combined with clinical  observations, patient history, and epidemiological information. If result is POSITIVE SARS-CoV-2 target nucleic acids are DETECTED. The SARS-CoV-2 RNA is generally detectable in upper and lower  respiratory specimens dur ing the acute phase of infection.  Positive  results are indicative of active infection with SARS-CoV-2.  Clinical  correlation with patient history and other diagnostic information is  necessary to determine patient infection status.  Positive results do  not rule out bacterial infection or co-infection with other viruses. If result is PRESUMPTIVE POSTIVE SARS-CoV-2 nucleic acids MAY BE PRESENT.   A presumptive positive result was obtained on the submitted specimen  and confirmed on repeat testing.  While 2019 novel coronavirus  (SARS-CoV-2) nucleic acids may be present in the submitted sample  additional confirmatory testing may be necessary for epidemiological  and / or clinical management purposes  to differentiate between  SARS-CoV-2 and other Sarbecovirus currently known to infect humans.  If clinically indicated additional testing with an alternate test  methodology [phone]) is advised. The SARS-CoV-2 RNA is generally  detectable in upper and lower respiratory sp ecimens during the acute  phase of infection. The expected result is Negative. Fact Sheet for Patients:  StrictlyIdeas.no Fact Sheet for Healthcare Providers: BankingDealers.co.za This test is not yet approved or cleared by the [place] FDA and has been authorized for detection and/or diagnosis of SARS-CoV-2 by FDA under an Emergency Use Authorization (EUA).  This EUA will remain in effect (meaning this test can be used) for the duration of the COVID-19 declaration under Section 564(b)(1) of the Act, 21 U.S.C. section 360bbb-3(b)(1), unless the authorization is terminated or revoked  sooner. Performed at [facility], [place] [address] [place], [place] [address]   Culture, Urine     Status: Abnormal   Collection Time: 09/28/19  2:05 AM   Specimen: Urine, Random  Result Value Ref Range Status   Specimen Description URINE, RANDOM  Final   Special Requests NONE  Final   Culture (A)  Final    >=100,000 COLONIES/mL KOCURIA ROSEA Standardized susceptibility testing for this organism is not available. Performed at [facility], [place] [address], [place], [place] [address]    Report Status 09/30/2019 FINAL  Final      Radiology Studies: Vas [place] Lower Extremity Venous (dvt)  Result Date: 09/28/2019  Lower Venous Study Indications: Pulmonary embolism.  Performing Technologist:  Abram Sander RVS  Examination Guidelines: A complete evaluation includes B-mode imaging, spectral Doppler, color Doppler, and power Doppler as needed of all accessible portions of each vessel. Bilateral testing is considered an integral part of a complete examination. Limited examinations for reoccurring indications may be performed as noted.  +---------+---------------+---------+-----------+----------+--------------+  RIGHT     Compressibility Phasicity Spontaneity Properties Thrombus Aging  +---------+---------------+---------+-----------+----------+--------------+  CFV       Full            Yes       Yes                                    +---------+---------------+---------+-----------+----------+--------------+  SFJ       Full                                                             +---------+---------------+---------+-----------+----------+--------------+  FV Prox   Full                                                             +---------+---------------+---------+-----------+----------+--------------+  FV Mid    Full                                                             +---------+---------------+---------+-----------+----------+--------------+  FV Distal Full                                                              +---------+---------------+---------+-----------+----------+--------------+  PFV       Full                                                             +---------+---------------+---------+-----------+----------+--------------+  POP       Full            Yes       Yes                                    +---------+---------------+---------+-----------+----------+--------------+  PTV       Full                                                             +---------+---------------+---------+-----------+----------+--------------+  PERO                                                       Not visualized  +---------+---------------+---------+-----------+----------+--------------+   +---------+---------------+---------+-----------+----------+--------------+  LEFT      Compressibility Phasicity Spontaneity Properties Thrombus Aging  +---------+---------------+---------+-----------+----------+--------------+  CFV       Full            Yes       Yes                                    +---------+---------------+---------+-----------+----------+--------------+  SFJ       Full                                                             +---------+---------------+---------+-----------+----------+--------------+  FV Prox   Full                                                             +---------+---------------+---------+-----------+----------+--------------+  FV Mid    Full                                                             +---------+---------------+---------+-----------+----------+--------------+  FV Distal Full                                                             +---------+---------------+---------+-----------+----------+--------------+  PFV       Full                                                             +---------+---------------+---------+-----------+----------+--------------+  POP       Full            Yes       Yes                                     +---------+---------------+---------+-----------+----------+--------------+  PTV       Full                                                             +---------+---------------+---------+-----------+----------+--------------+  PERO                                                       Not visualized  +---------+---------------+---------+-----------+----------+--------------+     Summary: Right: There is no evidence of deep vein thrombosis in the lower extremity. No cystic structure found in the popliteal fossa. Left: There is no evidence of deep vein thrombosis in the lower extremity. No cystic structure found in the popliteal fossa.  *See table(s) above for measurements and observations. Electronically signed by Monica Martinez MD on 09/28/2019 at 5:15:09 PM.    Final       Scheduled Meds:  atorvastatin  20 mg Oral q1800   donepezil  10 mg Oral QHS   insulin aspart  0-9 Units Subcutaneous TID WC   insulin glargine  20 Units Subcutaneous Daily   Continuous Infusions:  cefTRIAXone (ROCEPHIN)  IV 1 g (09/29/19 0917)   heparin 1,000 Units/hr (09/29/19 1715)     LOS: 2 days      Time spent: 25 minutes   Dessa Phi, DO [facility] 09/30/2019, 9:39 AM   Available via Epic secure chat 7am-7pm After these hours, please refer to coverage provider listed on [email or web address]

## 2019-09-30 NOTE — Plan of Care (Signed)
Poc progressing.  

## 2019-10-01 LAB — CBC
HCT: 38 % — ABNORMAL LOW (ref 39.0–52.0)
Hemoglobin: 13.1 g/dL (ref 13.0–17.0)
MCH: 32.5 pg (ref 26.0–34.0)
MCHC: 34.5 g/dL (ref 30.0–36.0)
MCV: 94.3 fL (ref 80.0–100.0)
Platelets: 152 10*3/uL (ref 150–400)
RBC: 4.03 MIL/uL — ABNORMAL LOW (ref 4.22–5.81)
RDW: 13.2 % (ref 11.5–15.5)
WBC: 8.4 10*3/uL (ref 4.0–10.5)
nRBC: 0 % (ref 0.0–0.2)

## 2019-10-01 LAB — GLUCOSE, CAPILLARY
Glucose-Capillary: 145 mg/dL — ABNORMAL HIGH (ref 70–99)
Glucose-Capillary: 173 mg/dL — ABNORMAL HIGH (ref 70–99)
Glucose-Capillary: 174 mg/dL — ABNORMAL HIGH (ref 70–99)
Glucose-Capillary: 206 mg/dL — ABNORMAL HIGH (ref 70–99)

## 2019-10-01 LAB — BASIC METABOLIC PANEL
Anion gap: 11 (ref 5–15)
BUN: 13 mg/dL (ref 8–23)
CO2: 23 mmol/L (ref 22–32)
Calcium: 9.3 mg/dL (ref 8.9–10.3)
Chloride: 105 mmol/L (ref 98–111)
Creatinine, Ser: 1.46 mg/dL — ABNORMAL HIGH (ref 0.61–1.24)
GFR calc Af Amer: 48 mL/min — ABNORMAL LOW (ref >60)
GFR calc non Af Amer: 41 mL/min — ABNORMAL LOW (ref >60)
Glucose, Bld: 147 mg/dL — ABNORMAL HIGH (ref 70–99)
Potassium: 4.1 mmol/L (ref 3.5–5.1)
Sodium: 139 mmol/L (ref 135–145)

## 2019-10-01 LAB — HEPARIN LEVEL (UNFRACTIONATED): Heparin Unfractionated: 0.45 IU/mL (ref 0.30–0.70)

## 2019-10-01 NOTE — TOC Progression Note (Signed)
Transition of Care [facility]) - Progression Note    Patient Details  Name: [name] [name] MRN: [id number] Date of Birth: [birth year]  Transition [facility]) CM/SW [place], [place] Phone Number: [phone] 10/01/2019, 4:54 PM  Clinical Narrative:     CSW spoke with patient's daughter Loletha Carrow and gave her the 3 bed offers that had come back. She inquired about [facility] stating that is where they prefer patient to go. CSW informed her that the [facility] team will reach out to her once more offers come back.  TOC team will continue to follow for discharge planning.   Expected Discharge Plan: Skilled Nursing Facility Barriers to Discharge: Ship broker, SNF Pending bed offer  Expected Discharge Plan and Services Expected Discharge Plan: [place] arrangements for the past 2 months: Single Family Home                                       Social Determinants of Health (SDOH) Interventions    Readmission Risk Interventions No flowsheet data found.

## 2019-10-01 NOTE — Progress Notes (Signed)
PROGRESS NOTE    [name] [name]  [id number] DOB: 03-11-1926 DOA: 09/27/2019 PCP: Haywood Pao, MD     Brief Narrative:  [name] [name] is a 85 year old male with past medical history significant for chronic atrial fibrillation not on anticoagulation, type 2 diabetes, chronic kidney disease stage III, history of CVA with chronic left-sided weakness, chronic diastolic heart failure, COPD, hypertension, dementia, OSA not on CPAP who presents with sudden tremor of upper extremity, fixed left gaze for around 10 seconds.  Upon EMS arrival, they did not note any neuro deficits.  Patient does have history of CVA with chronic left-sided weakness.  Patient had some altered mental status for about 3 to 4 minutes after the event, no history of seizures in the past.  In the emergency department, CTA chest revealed bilateral saddle PE.  He was started on IV heparin, critical care consulted.   New events last 24 hours / Subjective: Pleasantly confused, doing well.  He denies any chest pain or worsening shortness of breath.  Assessment & Plan:   Principal Problem:   Pulmonary embolism (HCC) Active Problems:   Hyperlipidemia   HYPERTENSION, BENIGN   Atrial fibrillation, chronic   GERD   Hyperglycemia   Elevated troponin   COPD (chronic obstructive pulmonary disease) with chronic bronchitis (HCC)   Dementia without behavioral disturbance (HCC)   CKD (chronic kidney disease), stage III   Chronic diastolic CHF (congestive heart failure) ([place])   Acute bilateral saddle PE -PCCM consulted, did not recommend thrombolytic.  Signed off 10/8 -Echocardiogram EF 60-65%, normal LV function, global right ventricle with moderate reduced systolic function -Venous duplex Dopplers negative for DVT  -Continue IV heparin for 3 to 5 days, consider transition to oral anticoagulant at that time   Concern for seizure activity -Discussed with neurology at the time of admission, they recommended MRI, echo,  EEG.  If abnormal, plan for formal neurologic consultation.  Hold off on antiepileptics for now as patient has no history of seizures and this was not a definite seizure -MRI: motion degraded study without acute finding -EEG: no seizures or epileptiform discharges were seen throughout the recording. -No further events while in hospital   Demand ischemia -Secondary to PE, peaked at 139 and trended downward -Echocardiogram without wall motion abnormality   Chronic atrial fibrillation -CHA2DS2-VASc score 5.  Xarelto previously discontinued due to medical noncompliance -IV heparin -Telemetry   Hyperlipidemia -Continue Lipitor  Dementia -Appears to be stable for now -Continue Aricept  Chronic kidney disease stage III -Baseline creatinine 1.4-1.7 -Stable  Chronic diastolic heart failure -Euvolemic at this time.  BNP 154 -Holding Toprol, patient had episode of nonsustained bradycardia on admission   Asymptomatic pyuria -Urine culture positive for Kocuria Rosea > 100,000 -Discussed with Dr. Linus Salmons ID, patient without infectious symptoms, would not treat with antibiotics at this time. Stop Rocephin.   Type 2 diabetes with hyperglycemia -Hold Amaryl -Lantus, sliding scale insulin    DVT prophylaxis: IV heparin Code Status: DNR Family Communication: None  Disposition Plan: Continue IV heparin for now.  SNF placement pending   Consultants:   PCCM  Procedures:   None   Antimicrobials:  Anti-infectives (From admission, onward)   Start     Dose/Rate Route Frequency Ordered Stop   09/28/19 1000  cefTRIAXone (ROCEPHIN) 1 g in sodium chloride 0.9 % 100 mL IVPB  Status:  Discontinued     1 g 200 mL/hr over 30 Minutes Intravenous Every 24 hours 09/28/19 0839 09/30/19 0949  Objective: Vitals:   09/30/19 1258 09/30/19 2024 10/01/19 0017 10/01/19 0611  BP:  118/68 133/74 (!) 148/84  Pulse:  91 88 88  Resp:   16 16  Temp: 98 F (36.7 C) 98 F (36.7 C) 97.6 F (36.4  C) 98 F (36.7 C)  TempSrc: Oral Oral Oral Oral  SpO2:  96% 96% 95%  Weight:      Height:        Intake/Output Summary (Last 24 hours) at 10/01/2019 1001 Last data filed at 10/01/2019 0700 Gross per 24 hour  Intake 495 ml  Output 500 ml  Net -5 ml   Filed Weights   09/27/19 2146  Weight: 72.6 kg    Examination: General exam: Appears calm and comfortable  Respiratory system: Clear to auscultation. Respiratory effort normal.  Stable on room air Cardiovascular system: S1 & S2 heard, irregular rhythm. No pedal edema. Gastrointestinal system: Abdomen is nondistended, soft and nontender. Normal bowel sounds heard. Central nervous system: Alert Extremities: Symmetric in appearance bilaterally  Skin: No rashes, lesions or ulcers on exposed skin  Psychiatry: Judgement and insight appear poor, history of dementia  Data Reviewed: I have personally reviewed following labs and imaging studies  CBC: Recent Labs  Lab 09/27/19 2211 09/27/19 2219 09/28/19 0417 09/29/19 0418 09/30/19 0322 10/01/19 0733  WBC 15.4*  --  11.3* 10.4 8.7 8.4  NEUTROABS 13.2*  --   --   --   --   --   HGB 13.2 13.6 12.8* 12.7* 12.6* 13.1  HCT 39.7 40.0 39.1 38.2* 36.4* 38.0*  MCV 97.1  --  98.2 96.7 95.0 94.3  PLT 192  --  164 144* 141* 0000000   Basic Metabolic Panel: Recent Labs  Lab 09/27/19 2211 09/27/19 2219 09/28/19 0417 09/29/19 0418 09/30/19 0322 10/01/19 0733  NA 135 137 136 139 138 139  K 4.9 4.8 3.9 3.9 3.7 4.1  CL 104 104 102 105 105 105  CO2 20*  --  24 22 23 23   GLUCOSE 351* 350* 284* 148* 170* 147*  BUN 21 28* 18 14 15 13   CREATININE 1.50* 1.30* 1.40* 1.17 1.35* 1.46*  CALCIUM 8.8*  --  8.9 9.0 8.9 9.3  MG 2.0  --  2.1  --   --   --   PHOS  --   --  3.5  --   --   --    GFR: Estimated Creatinine Clearance: 31.9 mL/min (A) (by C-G formula based on SCr of 1.46 mg/dL (H)). Liver Function Tests: Recent Labs  Lab 09/27/19 2211 09/28/19 0417  AST 33 17  ALT 22 22  ALKPHOS  101 98  BILITOT 1.7* 0.9  PROT 6.4* 6.4*  ALBUMIN 3.4* 3.1*   No results for input(s): LIPASE, AMYLASE in the last 168 hours. No results for input(s): AMMONIA in the last 168 hours. Coagulation Profile: No results for input(s): INR, PROTIME in the last 168 hours. Cardiac Enzymes: No results for input(s): CKTOTAL, CKMB, CKMBINDEX, TROPONINI in the last 168 hours. BNP (last 3 results) No results for input(s): PROBNP in the last 8760 hours. HbA1C: No results for input(s): HGBA1C in the last 72 hours. CBG: Recent Labs  Lab 09/30/19 0639 09/30/19 1218 09/30/19 1706 09/30/19 2118 10/01/19 0615  GLUCAP 135* 211* 191* 158* 145*   Lipid Profile: No results for input(s): CHOL, HDL, LDLCALC, TRIG, CHOLHDL, LDLDIRECT in the last 72 hours. Thyroid Function Tests: No results for input(s): TSH, T4TOTAL, FREET4, T3FREE, THYROIDAB in the last 72  hours. Anemia Panel: No results for input(s): VITAMINB12, FOLATE, FERRITIN, TIBC, IRON, RETICCTPCT in the last 72 hours. Sepsis Labs: No results for input(s): PROCALCITON, LATICACIDVEN in the last 168 hours.  Recent Results (from the past 240 hour(s))  SARS Coronavirus 2 [facility] order, Performed in [facility] hospital lab) Nasopharyngeal Nasopharyngeal Swab     Status: None   Collection Time: 09/27/19 10:05 PM   Specimen: Nasopharyngeal Swab  Result Value Ref Range Status   SARS Coronavirus 2 NEGATIVE NEGATIVE Final    Comment: (NOTE) If result is NEGATIVE SARS-CoV-2 target nucleic acids are NOT DETECTED. The SARS-CoV-2 RNA is generally detectable in upper and lower  respiratory specimens during the acute phase of infection. The lowest  concentration of SARS-CoV-2 viral copies this assay can detect is 250  copies / mL. A negative result does not preclude SARS-CoV-2 infection  and should not be used as the sole basis for treatment or other  patient management decisions.  A negative result may occur with  improper specimen collection /  handling, submission of specimen other  than nasopharyngeal swab, presence of viral mutation(s) within the  areas targeted by this assay, and inadequate number of viral copies  (<250 copies / mL). A negative result must be combined with clinical  observations, patient history, and epidemiological information. If result is POSITIVE SARS-CoV-2 target nucleic acids are DETECTED. The SARS-CoV-2 RNA is generally detectable in upper and lower  respiratory specimens dur ing the acute phase of infection.  Positive  results are indicative of active infection with SARS-CoV-2.  Clinical  correlation with patient history and other diagnostic information is  necessary to determine patient infection status.  Positive results do  not rule out bacterial infection or co-infection with other viruses. If result is PRESUMPTIVE POSTIVE SARS-CoV-2 nucleic acids MAY BE PRESENT.   A presumptive positive result was obtained on the submitted specimen  and confirmed on repeat testing.  While 2019 novel coronavirus  (SARS-CoV-2) nucleic acids may be present in the submitted sample  additional confirmatory testing may be necessary for epidemiological  and / or clinical management purposes  to differentiate between  SARS-CoV-2 and other Sarbecovirus currently known to infect humans.  If clinically indicated additional testing with an alternate test  methodology [phone]) is advised. The SARS-CoV-2 RNA is generally  detectable in upper and lower respiratory sp ecimens during the acute  phase of infection. The expected result is Negative. Fact Sheet for Patients:  StrictlyIdeas.no Fact Sheet for Healthcare Providers: BankingDealers.co.za This test is not yet approved or cleared by the [place] FDA and has been authorized for detection and/or diagnosis of SARS-CoV-2 by FDA under an Emergency Use Authorization (EUA).  This EUA will remain in effect (meaning this  test can be used) for the duration of the COVID-19 declaration under Section 564(b)(1) of the Act, 21 U.S.C. section 360bbb-3(b)(1), unless the authorization is terminated or revoked sooner. Performed at [facility], [place] [address] [place], [place] [address]   Culture, Urine     Status: Abnormal   Collection Time: 09/28/19  2:05 AM   Specimen: Urine, Random  Result Value Ref Range Status   Specimen Description URINE, RANDOM  Final   Special Requests NONE  Final   Culture (A)  Final    >=100,000 COLONIES/mL KOCURIA ROSEA Standardized susceptibility testing for this organism is not available. Performed at [facility], [place] [address], [place], [place] [address]    Report Status 09/30/2019 FINAL  Final  Radiology Studies: No results found.    Scheduled Meds: . atorvastatin  20 mg Oral q1800  . donepezil  10 mg Oral QHS  . insulin aspart  0-9 Units Subcutaneous TID WC  . insulin glargine  20 Units Subcutaneous Daily   Continuous Infusions: . heparin 1,000 Units/hr (09/30/19 1838)     LOS: 3 days      Time spent: 25 minutes   Dessa Phi, DO [facility] 10/01/2019, 10:01 AM   Available via Epic secure chat 7am-7pm After these hours, please refer to coverage provider listed on [email or web address]

## 2019-10-01 NOTE — Plan of Care (Signed)

## 2019-10-01 NOTE — Plan of Care (Signed)
  Problem: Education: Goal: Knowledge of General Education information will improve Description: Including pain rating scale, medication(s)/side effects and non-pharmacologic comfort measures 10/01/2019 1559 by Glenard Haring, RN Outcome: Progressing 10/01/2019 1554 by Glenard Haring, RN Outcome: Progressing   Problem: Health Behavior/Discharge Planning: Goal: Ability to manage health-related needs will improve 10/01/2019 1559 by Glenard Haring, RN Outcome: Progressing 10/01/2019 1554 by Glenard Haring, RN Outcome: Progressing   Problem: Clinical Measurements: Goal: Ability to maintain clinical measurements within normal limits will improve 10/01/2019 1559 by Glenard Haring, RN Outcome: Progressing 10/01/2019 1554 by Glenard Haring, RN Outcome: Progressing Goal: Will remain free from infection 10/01/2019 1559 by Glenard Haring, RN Outcome: Progressing 10/01/2019 1554 by Glenard Haring, RN Outcome: Progressing Goal: Diagnostic test results will improve 10/01/2019 1559 by Glenard Haring, RN Outcome: Progressing 10/01/2019 1554 by Glenard Haring, RN Outcome: Progressing Goal: Respiratory complications will improve 10/01/2019 1559 by Glenard Haring, RN Outcome: Progressing 10/01/2019 1554 by Glenard Haring, RN Outcome: Progressing Goal: Cardiovascular complication will be avoided 10/01/2019 1559 by Glenard Haring, RN Outcome: Progressing 10/01/2019 1554 by Glenard Haring, RN Outcome: Progressing   Problem: Activity: Goal: Risk for activity intolerance will decrease 10/01/2019 1559 by Glenard Haring, RN Outcome: Progressing 10/01/2019 1554 by Glenard Haring, RN Outcome: Progressing   Problem: Nutrition: Goal: Adequate nutrition will be maintained 10/01/2019 1559 by Glenard Haring, RN Outcome: Progressing 10/01/2019 1554 by Glenard Haring, RN Outcome: Progressing   Problem: Coping: Goal: Level of anxiety will  decrease 10/01/2019 1559 by Glenard Haring, RN Outcome: Progressing 10/01/2019 1554 by Glenard Haring, RN Outcome: Progressing   Problem: Elimination: Goal: Will not experience complications related to bowel motility 10/01/2019 1559 by Glenard Haring, RN Outcome: Progressing 10/01/2019 1554 by Glenard Haring, RN Outcome: Progressing Goal: Will not experience complications related to urinary retention 10/01/2019 1559 by Glenard Haring, RN Outcome: Progressing 10/01/2019 1554 by Glenard Haring, RN Outcome: Progressing   Problem: Pain Managment: Goal: General experience of comfort will improve 10/01/2019 1559 by Glenard Haring, RN Outcome: Progressing 10/01/2019 1554 by Glenard Haring, RN Outcome: Progressing   Problem: Safety: Goal: Ability to remain free from injury will improve 10/01/2019 1559 by Glenard Haring, RN Outcome: Progressing 10/01/2019 1554 by Glenard Haring, RN Outcome: Progressing   Problem: Skin Integrity: Goal: Risk for impaired skin integrity will decrease 10/01/2019 1559 by Glenard Haring, RN Outcome: Progressing 10/01/2019 1554 by Glenard Haring, RN Outcome: Progressing

## 2019-10-01 NOTE — Care Management (Signed)
Benefit check sent for DOACs, it will result Monday when insurance office are open.

## 2019-10-01 NOTE — Progress Notes (Signed)
[place] for heparin Indication: pulmonary embolus  Allergies  Allergen Reactions  . Sulfa Antibiotics Swelling    Swelling of hands    Patient Measurements: Height: 5\' 8"  (172.7 cm) Weight: 160 lb (72.6 kg) IBW/kg (Calculated) : 68.4  Vital Signs: Temp: 98 F (36.7 C) (10/11 0611) Temp Source: Oral (10/11 0611) BP: 148/84 (10/11 0611) Pulse Rate: 88 (10/11 0611)  Labs: Recent Labs    09/29/19 0418 09/30/19 0322 10/01/19 0733  HGB 12.7* 12.6* 13.1  HCT 38.2* 36.4* 38.0*  PLT 144* 141* 152  HEPARINUNFRC 0.66 0.52 0.45  CREATININE 1.17 1.35*  --     Estimated Creatinine Clearance: 34.5 mL/min (A) (by C-G formula based on SCr of 1.35 mg/dL (H)).  Assessment: 85 yo male history of Afib now with bilateral PE on heparin. No AC PTA. Plans noted for oral AC in 3-5 days.  Heparin level therapeutic at 0.45 today on drip rate 1000 units/hr. CBC improved and stable. No overt bleeding or infusion issues noted.   Goal of Therapy:  Heparin level 0.3-0.7 units/ml Monitor platelets by anticoagulation protocol: Yes   Plan:  - Continue heparin at 1000 units/hr - Daily heparin level and CBC - Monitor for bleeding - F/u transition to oral [facility] when case management results return tomorrow  Richardine Service, PharmD PGY1 Pharmacy Resident Phone: [phone] 10/01/2019  8:23 AM  Please check [email or web address] for unit-specific pharmacy phone numbers.

## 2019-10-02 LAB — CBC
HCT: 36.5 % — ABNORMAL LOW (ref 39.0–52.0)
HCT: 35.5 % — ABNORMAL LOW (ref 39.0–52.0)
Hemoglobin: 12.5 g/dL — ABNORMAL LOW (ref 13.0–17.0)
Hemoglobin: 12 g/dL — ABNORMAL LOW (ref 13.0–17.0)
MCH: 32 pg (ref 26.0–34.0)
MCH: 31.9 pg (ref 26.0–34.0)
MCHC: 34.2 g/dL (ref 30.0–36.0)
MCHC: 33.8 g/dL (ref 30.0–36.0)
MCV: 93.4 fL (ref 80.0–100.0)
MCV: 94.4 fL (ref 80.0–100.0)
Platelets: 150 10*3/uL (ref 150–400)
Platelets: 149 10*3/uL — ABNORMAL LOW (ref 150–400)
RBC: 3.91 MIL/uL — ABNORMAL LOW (ref 4.22–5.81)
RBC: 3.76 MIL/uL — ABNORMAL LOW (ref 4.22–5.81)
RDW: 13.3 % (ref 11.5–15.5)
RDW: 13.3 % (ref 11.5–15.5)
WBC: 7.2 10*3/uL (ref 4.0–10.5)
WBC: 6.9 10*3/uL (ref 4.0–10.5)
nRBC: 0 % (ref 0.0–0.2)
nRBC: 0 % (ref 0.0–0.2)

## 2019-10-02 LAB — GLUCOSE, CAPILLARY
Glucose-Capillary: 219 mg/dL — ABNORMAL HIGH (ref 70–99)
Glucose-Capillary: 209 mg/dL — ABNORMAL HIGH (ref 70–99)
Glucose-Capillary: 123 mg/dL — ABNORMAL HIGH (ref 70–99)
Glucose-Capillary: 158 mg/dL — ABNORMAL HIGH (ref 70–99)

## 2019-10-02 LAB — BASIC METABOLIC PANEL
Anion gap: 11 (ref 5–15)
Anion gap: 10 (ref 5–15)
BUN: 12 mg/dL (ref 8–23)
BUN: 13 mg/dL (ref 8–23)
CO2: 23 mmol/L (ref 22–32)
CO2: 22 mmol/L (ref 22–32)
Calcium: 8.7 mg/dL — ABNORMAL LOW (ref 8.9–10.3)
Calcium: 8.7 mg/dL — ABNORMAL LOW (ref 8.9–10.3)
Chloride: 102 mmol/L (ref 98–111)
Chloride: 103 mmol/L (ref 98–111)
Creatinine, Ser: 1.32 mg/dL — ABNORMAL HIGH (ref 0.61–1.24)
Creatinine, Ser: 1.3 mg/dL — ABNORMAL HIGH (ref 0.61–1.24)
GFR calc Af Amer: 54 mL/min — ABNORMAL LOW (ref >60)
GFR calc Af Amer: 55 mL/min — ABNORMAL LOW (ref >60)
GFR calc non Af Amer: 47 mL/min — ABNORMAL LOW (ref >60)
GFR calc non Af Amer: 48 mL/min — ABNORMAL LOW (ref >60)
Glucose, Bld: 207 mg/dL — ABNORMAL HIGH (ref 70–99)
Glucose, Bld: 224 mg/dL — ABNORMAL HIGH (ref 70–99)
Potassium: 3.2 mmol/L — ABNORMAL LOW (ref 3.5–5.1)
Potassium: 3.3 mmol/L — ABNORMAL LOW (ref 3.5–5.1)
Sodium: 136 mmol/L (ref 135–145)
Sodium: 135 mmol/L (ref 135–145)

## 2019-10-02 LAB — HEPARIN LEVEL (UNFRACTIONATED): Heparin Unfractionated: 0.31 IU/mL (ref 0.30–0.70)

## 2019-10-02 MED ORDER — RIVAROXABAN 15 MG PO TABS
15.0000 mg | ORAL_TABLET | ORAL | Status: AC
  Administered 2019-10-02: 15 mg via ORAL
  Filled 2019-10-02: qty 1

## 2019-10-02 MED ORDER — RIVAROXABAN 15 MG PO TABS
15.0000 mg | ORAL_TABLET | Freq: Two times a day (BID) | ORAL | Status: DC
  Administered 2019-10-03 (×2): 15 mg via ORAL
  Filled 2019-10-02 (×2): qty 1

## 2019-10-02 MED ORDER — RIVAROXABAN 20 MG PO TABS: 20.0000 mg | ORAL_TABLET | Freq: Every day | ORAL | Status: DC

## 2019-10-02 NOTE — Care Management Important Message (Signed)
Important Message  Patient Details  Name: [name] [name] MRN: [id number] Date of Birth: Feb 09, 1926   Medicare Important Message Given:  Yes     [name] 10/02/2019, 2:35 PM

## 2019-10-02 NOTE — Progress Notes (Signed)
PROGRESS NOTE    [name] [name]  [id number] DOB: 1926-11-22 DOA: 09/27/2019 PCP: Haywood Pao, MD     Brief Narrative:  [name] [name] is a 85 year old male with past medical history significant for chronic atrial fibrillation not on anticoagulation, type 2 diabetes, chronic kidney disease stage III, history of CVA with chronic left-sided weakness, chronic diastolic heart failure, COPD, hypertension, dementia, OSA not on CPAP who presents with sudden tremor of upper extremity, fixed left gaze for around 10 seconds.  Upon EMS arrival, they did not note any neuro deficits.  Patient does have history of CVA with chronic left-sided weakness.  Patient had some altered mental status for about 3 to 4 minutes after the event, no history of seizures in the past.  In the emergency department, CTA chest revealed bilateral saddle PE.  He was started on IV heparin, critical care consulted.   New events last 24 hours / Subjective: States that he is doing well, denies any chest pain or shortness of breath today.  Assessment & Plan:   Principal Problem:   Pulmonary embolism (HCC) Active Problems:   Hyperlipidemia   HYPERTENSION, BENIGN   Atrial fibrillation, chronic   GERD   Hyperglycemia   Elevated troponin   COPD (chronic obstructive pulmonary disease) with chronic bronchitis (HCC)   Dementia without behavioral disturbance (HCC)   CKD (chronic kidney disease), stage III   Chronic diastolic CHF (congestive heart failure) ([place])   Acute bilateral saddle PE -PCCM consulted, did not recommend thrombolytic.  Signed off 10/8 -Echocardiogram EF 60-65%, normal LV function, global right ventricle with moderate reduced systolic function -Venous duplex Dopplers negative for DVT  -Continue IV heparin for 3 to 5 days, awaiting benefit check for DOAC prior to transitioning from IV heparin  Concern for seizure activity -Discussed with neurology at the time of admission, they recommended MRI,  echo, EEG.  If abnormal, plan for formal neurologic consultation.  Hold off on antiepileptics for now as patient has no history of seizures and this was not a definite seizure -MRI: motion degraded study without acute finding -EEG: no seizures or epileptiform discharges were seen throughout the recording. -No further events while in hospital   Demand ischemia -Secondary to PE, peaked at 139 and trended downward -Echocardiogram without wall motion abnormality   Chronic atrial fibrillation -CHA2DS2-VASc score 5.  Xarelto previously discontinued due to medical noncompliance -IV heparin -Telemetry   Hyperlipidemia -Continue Lipitor  Dementia -Appears to be stable for now -Continue Aricept  Chronic kidney disease stage III -Baseline creatinine 1.4-1.7 -Stable  Chronic diastolic heart failure -Euvolemic at this time.  BNP 154 -Holding Toprol, patient had episode of nonsustained bradycardia on admission   Asymptomatic pyuria -Urine culture positive for Kocuria Rosea > 100,000 -Discussed with Dr. Linus Salmons ID, patient without infectious symptoms, would not treat with antibiotics at this time. Stop Rocephin.   Type 2 diabetes with hyperglycemia -Hold Amaryl -Lantus, sliding scale insulin    DVT prophylaxis: IV heparin Code Status: DNR Family Communication: None  Disposition Plan: Continue IV heparin for now.  Awaiting benefit check for DOAC. SNF placement pending   Consultants:   PCCM  Procedures:   None   Antimicrobials:  Anti-infectives (From admission, onward)   Start     Dose/Rate Route Frequency Ordered Stop   09/28/19 1000  cefTRIAXone (ROCEPHIN) 1 g in sodium chloride 0.9 % 100 mL IVPB  Status:  Discontinued     1 g 200 mL/hr over 30 Minutes Intravenous Every 24  hours 09/28/19 0839 09/30/19 0949       Objective: Vitals:   10/01/19 2013 10/02/19 0023 10/02/19 0415 10/02/19 0914  BP:  132/88 (!) 135/92 114/63  Pulse: 99 100 95   Resp:  18 18   Temp: 98.4  F (36.9 C) 97.6 F (36.4 C) 97.6 F (36.4 C) 97.7 F (36.5 C)  TempSrc: Oral Oral Oral Axillary  SpO2: 95% 95% 95% 95%  Weight:      Height:        Intake/Output Summary (Last 24 hours) at 10/02/2019 0956 Last data filed at 10/02/2019 0418 Gross per 24 hour  Intake 400 ml  Output 700 ml  Net -300 ml   Filed Weights   09/27/19 2146  Weight: 72.6 kg    Examination: General exam: Appears calm and comfortable  Respiratory system: Clear to auscultation. Respiratory effort normal. On room air without distress Cardiovascular system: S1 & S2 heard, irregular rhythm. No pedal edema. Gastrointestinal system: Abdomen is nondistended, soft and nontender. Normal bowel sounds heard. Central nervous system: Alert. Non focal exam. Speech clear  Extremities: Symmetric in appearance bilaterally  Skin: No rashes, lesions or ulcers on exposed skin  Psychiatry: Judgement and insight appear stable.  History of dementia  Data Reviewed: I have personally reviewed following labs and imaging studies  CBC: Recent Labs  Lab 09/27/19 2211 09/27/19 2219 09/28/19 0417 09/29/19 0418 09/30/19 0322 10/01/19 0733  WBC 15.4*  --  11.3* 10.4 8.7 8.4  NEUTROABS 13.2*  --   --   --   --   --   HGB 13.2 13.6 12.8* 12.7* 12.6* 13.1  HCT 39.7 40.0 39.1 38.2* 36.4* 38.0*  MCV 97.1  --  98.2 96.7 95.0 94.3  PLT 192  --  164 144* 141* 0000000   Basic Metabolic Panel: Recent Labs  Lab 09/27/19 2211 09/27/19 2219 09/28/19 0417 09/29/19 0418 09/30/19 0322 10/01/19 0733  NA 135 137 136 139 138 139  K 4.9 4.8 3.9 3.9 3.7 4.1  CL 104 104 102 105 105 105  CO2 20*  --  24 22 23 23   GLUCOSE 351* 350* 284* 148* 170* 147*  BUN 21 28* 18 14 15 13   CREATININE 1.50* 1.30* 1.40* 1.17 1.35* 1.46*  CALCIUM 8.8*  --  8.9 9.0 8.9 9.3  MG 2.0  --  2.1  --   --   --   PHOS  --   --  3.5  --   --   --    GFR: Estimated Creatinine Clearance: 31.9 mL/min (A) (by C-G formula based on SCr of 1.46 mg/dL (H)). Liver  Function Tests: Recent Labs  Lab 09/27/19 2211 09/28/19 0417  AST 33 17  ALT 22 22  ALKPHOS 101 98  BILITOT 1.7* 0.9  PROT 6.4* 6.4*  ALBUMIN 3.4* 3.1*   No results for input(s): LIPASE, AMYLASE in the last 168 hours. No results for input(s): AMMONIA in the last 168 hours. Coagulation Profile: No results for input(s): INR, PROTIME in the last 168 hours. Cardiac Enzymes: No results for input(s): CKTOTAL, CKMB, CKMBINDEX, TROPONINI in the last 168 hours. BNP (last 3 results) No results for input(s): PROBNP in the last 8760 hours. HbA1C: No results for input(s): HGBA1C in the last 72 hours. CBG: Recent Labs  Lab 10/01/19 0615 10/01/19 1127 10/01/19 1710 10/01/19 2232 10/02/19 0638  GLUCAP 145* 173* 174* 206* 219*   Lipid Profile: No results for input(s): CHOL, HDL, LDLCALC, TRIG, CHOLHDL, LDLDIRECT in the last  72 hours. Thyroid Function Tests: No results for input(s): TSH, T4TOTAL, FREET4, T3FREE, THYROIDAB in the last 72 hours. Anemia Panel: No results for input(s): VITAMINB12, FOLATE, FERRITIN, TIBC, IRON, RETICCTPCT in the last 72 hours. Sepsis Labs: No results for input(s): PROCALCITON, LATICACIDVEN in the last 168 hours.  Recent Results (from the past 240 hour(s))  SARS Coronavirus 2 [facility] order, Performed in [facility] hospital lab) Nasopharyngeal Nasopharyngeal Swab     Status: None   Collection Time: 09/27/19 10:05 PM   Specimen: Nasopharyngeal Swab  Result Value Ref Range Status   SARS Coronavirus 2 NEGATIVE NEGATIVE Final    Comment: (NOTE) If result is NEGATIVE SARS-CoV-2 target nucleic acids are NOT DETECTED. The SARS-CoV-2 RNA is generally detectable in upper and lower  respiratory specimens during the acute phase of infection. The lowest  concentration of SARS-CoV-2 viral copies this assay can detect is 250  copies / mL. A negative result does not preclude SARS-CoV-2 infection  and should not be used as the sole basis for treatment or other   patient management decisions.  A negative result may occur with  improper specimen collection / handling, submission of specimen other  than nasopharyngeal swab, presence of viral mutation(s) within the  areas targeted by this assay, and inadequate number of viral copies  (<250 copies / mL). A negative result must be combined with clinical  observations, patient history, and epidemiological information. If result is POSITIVE SARS-CoV-2 target nucleic acids are DETECTED. The SARS-CoV-2 RNA is generally detectable in upper and lower  respiratory specimens dur ing the acute phase of infection.  Positive  results are indicative of active infection with SARS-CoV-2.  Clinical  correlation with patient history and other diagnostic information is  necessary to determine patient infection status.  Positive results do  not rule out bacterial infection or co-infection with other viruses. If result is PRESUMPTIVE POSTIVE SARS-CoV-2 nucleic acids MAY BE PRESENT.   A presumptive positive result was obtained on the submitted specimen  and confirmed on repeat testing.  While 2019 novel coronavirus  (SARS-CoV-2) nucleic acids may be present in the submitted sample  additional confirmatory testing may be necessary for epidemiological  and / or clinical management purposes  to differentiate between  SARS-CoV-2 and other Sarbecovirus currently known to infect humans.  If clinically indicated additional testing with an alternate test  methodology [id number]) is advised. The SARS-CoV-2 RNA is generally  detectable in upper and lower respiratory sp ecimens during the acute  phase of infection. The expected result is Negative. Fact Sheet for Patients:  StrictlyIdeas.no Fact Sheet for Healthcare Providers: BankingDealers.co.za This test is not yet approved or cleared by the [place] FDA and has been authorized for detection and/or diagnosis of SARS-CoV-2 by  FDA under an Emergency Use Authorization (EUA).  This EUA will remain in effect (meaning this test can be used) for the duration of the COVID-19 declaration under Section 564(b)(1) of the Act, 21 U.S.C. section 360bbb-3(b)(1), unless the authorization is terminated or revoked sooner. Performed at [facility], [place] [address] [place], [place] [address]   Culture, Urine     Status: Abnormal   Collection Time: 09/28/19  2:05 AM   Specimen: Urine, Random  Result Value Ref Range Status   Specimen Description URINE, RANDOM  Final   Special Requests NONE  Final   Culture (A)  Final    >=100,000 COLONIES/mL KOCURIA ROSEA Standardized susceptibility testing for this organism is not available. Performed at [facility]  Lab, [address] [place], [place] [address]    Report Status 09/30/2019 FINAL  Final      Radiology Studies: No results found.    Scheduled Meds: . atorvastatin  20 mg Oral q1800  . donepezil  10 mg Oral QHS  . insulin aspart  0-9 Units Subcutaneous TID WC  . insulin glargine  20 Units Subcutaneous Daily   Continuous Infusions: . heparin 1,000 Units/hr (10/01/19 2057)     LOS: 4 days      Time spent: 20 minutes   [name] Phi, DO [facility] 10/02/2019, 9:56 AM   Available via Epic secure chat 7am-7pm After these hours, please refer to coverage provider listed on [email or web address]

## 2019-10-02 NOTE — TOC Progression Note (Addendum)
Transition of Care [facility]) - Progression Note    Patient Details  Name: GANON BRISBIN MRN: [id number] Date of Birth: 1926-02-16  Transition of Care [facility]) CM/SW [place], [place] Phone Number: 10/02/2019, 4:52 PM  Clinical Narrative:     Patients' daughter has selected [facility] contacted SNF, informed patient has accepted bed offer.    Patient will need insurance approval and covid test prior to discharging to SNF.  Thurmond Butts, MSW, [facility] Clinical Social Worker [phone]   Expected Discharge Plan: Skilled Nursing Facility Barriers to Discharge: Insurance Authorization, SNF Pending bed offer  Expected Discharge Plan and Services Expected Discharge Plan: [place] arrangements for the past 2 months: Single Family Home                                       Social Determinants of Health (SDOH) Interventions    Readmission Risk Interventions No flowsheet data found.

## 2019-10-02 NOTE — Plan of Care (Signed)
Poc progressing.  

## 2019-10-02 NOTE — TOC Benefit Eligibility Note (Signed)
Transition of Care [facility]) Benefit Eligibility Note    Patient Details  Name: [name] [name] MRN: [id number] Date of Birth: [birth year]   Medication/Dose: Alveda Reasons 20 MG DAILY  Covered?: Yes  Tier: 3 Drug  Prescription Coverage [facility]erred Pharmacy: [facility]  Spoke with Person/Company/Phone Number:: JESSICA B. @ [employer] # [phone]  Co-Pay: $123.03  Prior Approval: Yes(# [id number])  Deductible: Met  Additional Notes: ELIQUIS  5 MG BID , COVER- YES, CO-PAY- $123.27, TIER- 3 DRUG, P/A - NO    Memory Argue Phone Number: 10/02/2019, 12:54 PM

## 2019-10-02 NOTE — Progress Notes (Addendum)
ANTICOAGULATION CONSULT NOTE - Follow Up Consult  Pharmacy Consult for HEPARIN>>Xarelto Indication: pulmonary embolus + h/o afib  Allergies  Allergen Reactions  . Sulfa Antibiotics Swelling    Swelling of hands    Patient Measurements: Height: 5\' 8"  (172.7 cm) Weight: 160 lb (72.6 kg) IBW/kg (Calculated) : 68.4 Heparin Dosing Weight: 72.6 kg  Vital Signs: Temp: 98 F (36.7 C) (10/12 1112) Temp Source: Oral (10/12 1112) BP: 114/74 (10/12 1112) Pulse Rate: 89 (10/12 1112)  Labs: Recent Labs    09/30/19 0322 10/01/19 0733 10/02/19 1006 10/02/19 1038 10/02/19 1202  HGB 12.6* 13.1 12.5* 12.0*  --   HCT 36.4* 38.0* 36.5* 35.5*  --   PLT 141* 152 150 149*  --   HEPARINUNFRC 0.52 0.45  --   --  0.31  CREATININE 1.35* 1.46* 1.32* 1.30*  --     Estimated Creatinine Clearance: 35.8 mL/min (A) (by C-G formula based on SCr of 1.3 mg/dL (H)).  Assessment:  Anticoag:  PE (no DVT). Also hx afib (no AC PTA). Hgb trending down to 12. Plts 149 stable. -HL = 0.31 low end of normal. CHA2DS2-VASc score 5. Transition to [place] today.  Goal of Therapy:  Heparin level 0.3-0.7 units/ml Monitor platelets by anticoagulation protocol: Yes   Plan:  -Increase heparin to 1100 units/hr so level doesn't decrease -daily HL/CBC (watch plt) -planning SNF discharge   1320: d/c IV heparin and transition to Xarelto 15mg  BID x 21d, then 20mg  daily.   Kenzey Birkland S. Alford Highland, PharmD, BCPS Clinical Staff Pharmacist [facility] 10/02/2019,12:45 PM

## 2019-10-02 NOTE — Discharge Instructions (Signed)

## 2019-10-02 NOTE — TOC Progression Note (Signed)
Transition of Care [facility]) - Progression Note    Patient Details  Name: [name] [name] MRN: [id number] Date of Birth: [birth year]  Transition of Care [facility]) CM/SW [place], [place] Phone Number: 10/02/2019, 11:31 AM  Clinical Narrative:    Called patient's daughter, [name] [name] to inform of SNF offers- left voice message to return call.  Thurmond Butts, MSW, [facility] Clinical Social Worker [phone]    Expected Discharge Plan: Skilled Nursing Facility Barriers to Discharge: Insurance Authorization, SNF Pending bed offer  Expected Discharge Plan and Services Expected Discharge Plan: [place] arrangements for the past 2 months: Single Family Home                                       Social Determinants of Health (SDOH) Interventions    Readmission Risk Interventions No flowsheet data found.

## 2019-10-02 NOTE — TOC Progression Note (Signed)
Transition of Care [facility]) - Progression Note    Patient Details  Name: [name] MRN: [id number] Date of Birth: [birth year]  Transition of Care [facility]) CM/SW [place], [place] Phone Number: 10/02/2019, 2:22 PM  Clinical Narrative:     CSW provided patient's daughter with bed offers- she will call CSW back with decision.  Thurmond Butts, MSW, [facility] Clinical Social Worker [phone]   Expected Discharge Plan: Skilled Nursing Facility Barriers to Discharge: Insurance Authorization, SNF Pending bed offer  Expected Discharge Plan and Services Expected Discharge Plan: [place] arrangements for the past 2 months: Single Family Home                                       Social Determinants of Health (SDOH) Interventions    Readmission Risk Interventions No flowsheet data found.

## 2019-10-03 LAB — GLUCOSE, CAPILLARY
Glucose-Capillary: 131 mg/dL — ABNORMAL HIGH (ref 70–99)
Glucose-Capillary: 203 mg/dL — ABNORMAL HIGH (ref 70–99)
Glucose-Capillary: 157 mg/dL — ABNORMAL HIGH (ref 70–99)
Glucose-Capillary: 146 mg/dL — ABNORMAL HIGH (ref 70–99)

## 2019-10-03 LAB — BASIC METABOLIC PANEL
Anion gap: 11 (ref 5–15)
BUN: 13 mg/dL (ref 8–23)
CO2: 24 mmol/L (ref 22–32)
Calcium: 9.1 mg/dL (ref 8.9–10.3)
Chloride: 104 mmol/L (ref 98–111)
Creatinine, Ser: 1.44 mg/dL — ABNORMAL HIGH (ref 0.61–1.24)
GFR calc Af Amer: 49 mL/min — ABNORMAL LOW (ref >60)
GFR calc non Af Amer: 42 mL/min — ABNORMAL LOW (ref >60)
Glucose, Bld: 154 mg/dL — ABNORMAL HIGH (ref 70–99)
Potassium: 3.4 mmol/L — ABNORMAL LOW (ref 3.5–5.1)
Sodium: 139 mmol/L (ref 135–145)

## 2019-10-03 MED ORDER — POTASSIUM CHLORIDE CRYS ER 20 MEQ PO TBCR
40.0000 meq | EXTENDED_RELEASE_TABLET | Freq: Once | ORAL | Status: AC
  Administered 2019-10-03: 40 meq via ORAL
  Filled 2019-10-03: qty 2

## 2019-10-03 MED ORDER — RIVAROXABAN 20 MG PO TABS: 20.0000 mg | ORAL_TABLET | Freq: Every day | ORAL | Status: DC

## 2019-10-03 MED ORDER — RIVAROXABAN 15 MG PO TABS
15.0000 mg | ORAL_TABLET | Freq: Two times a day (BID) | ORAL | Status: DC
  Administered 2019-10-03 – 2019-10-06 (×6): 15 mg via ORAL
  Filled 2019-10-03 (×6): qty 1

## 2019-10-03 NOTE — TOC Progression Note (Signed)
Transition of Care [facility]) - Progression Note    Patient Details  Name: [name] [name] MRN: [id number] Date of Birth: April 04, 1926  Transition of Care [facility]) CM/SW [place], [place] Phone Number: 10/03/2019, 11:26 AM  Clinical Narrative:     Patient's insurance needs updated PT/OT notes  -RN informed   Thurmond Butts, MSW, [facility] Clinical Social Worker [phone]   Expected Discharge Plan: [place] Barriers to Discharge: Insurance Authorization, SNF Pending bed offer  Expected Discharge Plan and Services Expected Discharge Plan: [place] arrangements for the past 2 months: Single Family Home                                       Social Determinants of Health (SDOH) Interventions    Readmission Risk Interventions No flowsheet data found.

## 2019-10-03 NOTE — Progress Notes (Signed)
PROGRESS NOTE    [name] [name]  [id number] DOB: 04-Mar-1926 DOA: 09/27/2019 PCP: Haywood Pao, MD     Brief Narrative:  [name] [name] is a 85 year old male with past medical history significant for chronic atrial fibrillation not on anticoagulation, type 2 diabetes, chronic kidney disease stage III, history of CVA with chronic left-sided weakness, chronic diastolic heart failure, COPD, hypertension, dementia, OSA not on CPAP who presents with sudden tremor of upper extremity, fixed left gaze for around 10 seconds.  Upon EMS arrival, they did not note any neuro deficits.  Patient does have history of CVA with chronic left-sided weakness.  Patient had some altered mental status for about 3 to 4 minutes after the event, no history of seizures in the past.  In the emergency department, CTA chest revealed bilateral saddle PE.  He was started on IV heparin, critical care consulted.   New events last 24 hours / Subjective: No complaints this morning, denies any chest pain, shortness of breath, nausea or vomiting  Assessment & Plan:   Principal Problem:   Pulmonary embolism (HCC) Active Problems:   Hyperlipidemia   HYPERTENSION, BENIGN   Atrial fibrillation, chronic   GERD   Hyperglycemia   Elevated troponin   COPD (chronic obstructive pulmonary disease) with chronic bronchitis (HCC)   Dementia without behavioral disturbance (HCC)   CKD (chronic kidney disease), stage III   Chronic diastolic CHF (congestive heart failure) ([place])   Acute bilateral saddle PE -PCCM consulted, did not recommend thrombolytic.  Signed off 10/8 -Echocardiogram EF 60-65%, normal LV function, global right ventricle with moderate reduced systolic function -Venous duplex Dopplers negative for DVT  -He was on IV heparin for 5 days.  Transition to Xarelto on 10/12   Concern for seizure activity -Discussed with neurology at the time of admission, they recommended MRI, echo, EEG.  If abnormal, plan for  formal neurologic consultation.  Hold off on antiepileptics for now as patient has no history of seizures and this was not a definite seizure -MRI: motion degraded study without acute finding -EEG: no seizures or epileptiform discharges were seen throughout the recording. -No further events while in hospital   Demand ischemia -Secondary to PE, peaked at 139 and trended downward -Echocardiogram without wall motion abnormality   Chronic atrial fibrillation -CHA2DS2-VASc score 5.  Xarelto previously discontinued due to medical noncompliance -Xarelto -Telemetry   Hyperlipidemia -Continue Lipitor  Dementia -Appears to be stable for now -Continue Aricept  Chronic kidney disease stage III -Baseline creatinine 1.4-1.7 -Stable  Chronic diastolic heart failure -Euvolemic at this time.  BNP 154 -Holding Toprol, patient had episode of nonsustained bradycardia on admission   Asymptomatic pyuria -Urine culture positive for Kocuria Rosea > 100,000 -Discussed with Dr. Linus Salmons ID, patient without infectious symptoms, would not treat with antibiotics at this time. Stop Rocephin.   Type 2 diabetes with hyperglycemia -Hold Amaryl -Lantus, sliding scale insulin  Hypokalemia -Replace, trend    DVT prophylaxis: Xarelto Code Status: DNR Family Communication: None  Disposition Plan: Awaiting SNF placement, repeat COVID test ordered   Consultants:   PCCM  Procedures:   None   Antimicrobials:  Anti-infectives (From admission, onward)   Start     Dose/Rate Route Frequency Ordered Stop   09/28/19 1000  cefTRIAXone (ROCEPHIN) 1 g in sodium chloride 0.9 % 100 mL IVPB  Status:  Discontinued     1 g 200 mL/hr over 30 Minutes Intravenous Every 24 hours 09/28/19 0839 09/30/19 0949  Objective: Vitals:   10/02/19 1624 10/02/19 1957 10/03/19 0621 10/03/19 0744  BP: 128/78 (!) 132/93 137/79 123/85  Pulse: 88 94 90 79  Resp: (!) 21 18 20 17   Temp: 98 F (36.7 C) 97.6 F (36.4 C)  (!) 97.3 F (36.3 C) (!) 97.5 F (36.4 C)  TempSrc: Oral Oral Oral Oral  SpO2: 95% 95% (!) 85% 95%  Weight:      Height:        Intake/Output Summary (Last 24 hours) at 10/03/2019 1020 Last data filed at 10/03/2019 0625 Gross per 24 hour  Intake -  Output 1000 ml  Net -1000 ml   Filed Weights   09/27/19 2146  Weight: 72.6 kg    Examination: General exam: Appears calm and comfortable  Respiratory system: Clear to auscultation. Respiratory effort normal.  On room air without respiratory distress Cardiovascular system: S1 & S2 heard, irregular, rate 70-80. No pedal edema. Gastrointestinal system: Abdomen is nondistended, soft and nontender. Normal bowel sounds heard. Central nervous system: Alert. Non focal exam. Speech clear  Extremities: Symmetric in appearance bilaterally  Skin: No rashes, lesions or ulcers on exposed skin  Psychiatry: Judgement and insight appear stable.  History of dementia, pleasantly confused this morning  Data Reviewed: I have personally reviewed following labs and imaging studies  CBC: Recent Labs  Lab 09/27/19 2211  09/29/19 0418 09/30/19 0322 10/01/19 0733 10/02/19 1006 10/02/19 1038  WBC 15.4*   < > 10.4 8.7 8.4 7.2 6.9  NEUTROABS 13.2*  --   --   --   --   --   --   HGB 13.2   < > 12.7* 12.6* 13.1 12.5* 12.0*  HCT 39.7   < > 38.2* 36.4* 38.0* 36.5* 35.5*  MCV 97.1   < > 96.7 95.0 94.3 93.4 94.4  PLT 192   < > 144* 141* 152 150 149*   < > = values in this interval not displayed.   Basic Metabolic Panel: Recent Labs  Lab 09/27/19 2211  09/28/19 0417  09/30/19 0322 10/01/19 0733 10/02/19 1006 10/02/19 1038 10/03/19 0310  NA 135   < > 136   < > 138 139 136 135 139  K 4.9   < > 3.9   < > 3.7 4.1 3.2* 3.3* 3.4*  CL 104   < > 102   < > 105 105 102 103 104  CO2 20*  --  24   < > 23 23 23 22 24   GLUCOSE 351*   < > 284*   < > 170* 147* 207* 224* 154*  BUN 21   < > 18   < > 15 13 12 13 13   CREATININE 1.50*   < > 1.40*   < > 1.35* 1.46*  1.32* 1.30* 1.44*  CALCIUM 8.8*  --  8.9   < > 8.9 9.3 8.7* 8.7* 9.1  MG 2.0  --  2.1  --   --   --   --   --   --   PHOS  --   --  3.5  --   --   --   --   --   --    < > = values in this interval not displayed.   GFR: Estimated Creatinine Clearance: 32.3 mL/min (A) (by C-G formula based on SCr of 1.44 mg/dL (H)). Liver Function Tests: Recent Labs  Lab 09/27/19 2211 09/28/19 0417  AST 33 17  ALT 22 22  ALKPHOS 101 98  BILITOT 1.7* 0.9  PROT 6.4* 6.4*  ALBUMIN 3.4* 3.1*   No results for input(s): LIPASE, AMYLASE in the last 168 hours. No results for input(s): AMMONIA in the last 168 hours. Coagulation Profile: No results for input(s): INR, PROTIME in the last 168 hours. Cardiac Enzymes: No results for input(s): CKTOTAL, CKMB, CKMBINDEX, TROPONINI in the last 168 hours. BNP (last 3 results) No results for input(s): PROBNP in the last 8760 hours. HbA1C: No results for input(s): HGBA1C in the last 72 hours. CBG: Recent Labs  Lab 10/02/19 0638 10/02/19 1116 10/02/19 1629 10/02/19 2208 10/03/19 0622  GLUCAP 219* 209* 123* 158* 131*   Lipid Profile: No results for input(s): CHOL, HDL, LDLCALC, TRIG, CHOLHDL, LDLDIRECT in the last 72 hours. Thyroid Function Tests: No results for input(s): TSH, T4TOTAL, FREET4, T3FREE, THYROIDAB in the last 72 hours. Anemia Panel: No results for input(s): VITAMINB12, FOLATE, FERRITIN, TIBC, IRON, RETICCTPCT in the last 72 hours. Sepsis Labs: No results for input(s): PROCALCITON, LATICACIDVEN in the last 168 hours.  Recent Results (from the past 240 hour(s))  SARS Coronavirus 2 [facility] order, Performed in [facility] hospital lab) Nasopharyngeal Nasopharyngeal Swab     Status: None   Collection Time: 09/27/19 10:05 PM   Specimen: Nasopharyngeal Swab  Result Value Ref Range Status   SARS Coronavirus 2 NEGATIVE NEGATIVE Final    Comment: (NOTE) If result is NEGATIVE SARS-CoV-2 target nucleic acids are NOT DETECTED. The SARS-CoV-2 RNA  is generally detectable in upper and lower  respiratory specimens during the acute phase of infection. The lowest  concentration of SARS-CoV-2 viral copies this assay can detect is 250  copies / mL. A negative result does not preclude SARS-CoV-2 infection  and should not be used as the sole basis for treatment or other  patient management decisions.  A negative result may occur with  improper specimen collection / handling, submission of specimen other  than nasopharyngeal swab, presence of viral mutation(s) within the  areas targeted by this assay, and inadequate number of viral copies  (<250 copies / mL). A negative result must be combined with clinical  observations, patient history, and epidemiological information. If result is POSITIVE SARS-CoV-2 target nucleic acids are DETECTED. The SARS-CoV-2 RNA is generally detectable in upper and lower  respiratory specimens dur ing the acute phase of infection.  Positive  results are indicative of active infection with SARS-CoV-2.  Clinical  correlation with patient history and other diagnostic information is  necessary to determine patient infection status.  Positive results do  not rule out bacterial infection or co-infection with other viruses. If result is PRESUMPTIVE POSTIVE SARS-CoV-2 nucleic acids MAY BE PRESENT.   A presumptive positive result was obtained on the submitted specimen  and confirmed on repeat testing.  While 2019 novel coronavirus  (SARS-CoV-2) nucleic acids may be present in the submitted sample  additional confirmatory testing may be necessary for epidemiological  and / or clinical management purposes  to differentiate between  SARS-CoV-2 and other Sarbecovirus currently known to infect humans.  If clinically indicated additional testing with an alternate test  methodology [phone]) is advised. The SARS-CoV-2 RNA is generally  detectable in upper and lower respiratory sp ecimens during the acute  phase of infection.  The expected result is Negative. Fact Sheet for Patients:  StrictlyIdeas.no Fact Sheet for Healthcare Providers: BankingDealers.co.za This test is not yet approved or cleared by the [place] FDA and has been authorized for detection and/or diagnosis of SARS-CoV-2 by FDA under an  Emergency Use Authorization (EUA).  This EUA will remain in effect (meaning this test can be used) for the duration of the COVID-19 declaration under Section 564(b)(1) of the Act, 21 U.S.C. section 360bbb-3(b)(1), unless the authorization is terminated or revoked sooner. Performed at [facility], [place] [address] [place], [place] [address]   Culture, Urine     Status: Abnormal   Collection Time: 09/28/19  2:05 AM   Specimen: Urine, Random  Result Value Ref Range Status   Specimen Description URINE, RANDOM  Final   Special Requests NONE  Final   Culture (A)  Final    >=100,000 COLONIES/mL KOCURIA ROSEA Standardized susceptibility testing for this organism is not available. Performed at [facility], [place] [address] [place], [place] [address]    Report Status 09/30/2019 FINAL  Final      Radiology Studies: No results found.    Scheduled Meds: . atorvastatin  20 mg Oral q1800  . donepezil  10 mg Oral QHS  . insulin aspart  0-9 Units Subcutaneous TID WC  . insulin glargine  20 Units Subcutaneous Daily  . [START ON 10/12/2019] rivaroxaban  20 mg Oral Q supper   Followed by  . rivaroxaban  15 mg Oral BID   Continuous Infusions:    LOS: 5 days      Time spent: 20 minutes   Dessa Phi, DO [facility] 10/03/2019, 10:20 AM   Available via Epic secure chat 7am-7pm After these hours, please refer to coverage provider listed on [email or web address]

## 2019-10-03 NOTE — Progress Notes (Signed)
Physical Therapy Treatment Patient Details Name: LENNYN VASBINDER MRN: [id number] DOB: 05/16/1926 Today's Date: 10/03/2019    History of Present Illness KYRYN BONNICI is a 85 year old male with past medical history significant for chronic atrial fibrillation not on anticoagulation, type 2 diabetes, chronic kidney disease stage III, history of CVA with chronic left-sided weakness, chronic diastolic heart failure, COPD, hypertension, dementia, OSA not on CPAP who presents with sudden tremor of upper extremity, fixed left gaze for around 10 seconds.  Upon EMS arrival, they did not note any neuro deficits.  Patient does have history of CVA with chronic left-sided weakness.  Patient had some altered mental status for about 3 to 4 minutes after the event, no history of seizures in the past.  In the emergency department, CTA chest revealed bilateral saddle PE.    PT Comments    Patient progressing slowly towards PT goals. Requires Mod A for bed mobility and cues for technique. Tolerated standing from EOB multiple times with Mod A of 2 pulling up on RW. Pt with significant posterior lean which worsened with taking steps to the chair. Also of note, pt with sudden onset of dizziness once sitting EOB, falling posteriorly and grabbing therapists arm. No nystagmus observed. Resolved after a few seconds but pt not the best historian due to hx of dementia. Possibly some BPPV? Continue to recommend SNF. Will follow.    Follow Up Recommendations  SNF;Supervision/Assistance - 24 hour     Equipment Recommendations  None recommended by PT    Recommendations for Other Services       Precautions / Restrictions Precautions Precautions: Fall Precaution Comments: Dizziness Restrictions Weight Bearing Restrictions: No    Mobility  Bed Mobility Overal bed mobility: Needs Assistance Bed Mobility: Supine to Sit     Supine to sit: Mod assist;HOB elevated     General bed mobility comments: pt able to  roll towards left side, assist with trunk to get to EOB and then for EOB balance as pt with severe dizziness grabbing therapist's arm due to posterior LOB.  Transfers Overall transfer level: Needs assistance Equipment used: Rolling walker (2 wheeled) Transfers: Sit to/from Stand Sit to Stand: Mod assist;+2 physical assistance;From elevated surface         General transfer comment: Assist of 2 to power to standing with pt pulling up on RW, heavy posterior lean. Stood from [employer], from chair x1. Upon taking a few steps to get to chair, pt prematurely sitting due to posterior LOB.  Ambulation/Gait Ambulation/Gait assistance: Mod assist;Max assist;+2 physical assistance Gait Distance (Feet): 3 Feet Assistive device: Rolling walker (2 wheeled) Gait Pattern/deviations: Shuffle;Leaning posteriorly     General Gait Details: Able to take a few marching steps in place and then taking a few steps to get to chair, pt with heavy and increasingly worsened posterior lean requiring Mod A of 2 initially to Max A due to sitting prematurely on chair.   Stairs             Wheelchair Mobility    Modified Rankin (Stroke Patients Only)       Balance Overall balance assessment: Needs assistance Sitting-balance support: Feet supported;No upper extremity supported Sitting balance-Leahy Scale: Poor Sitting balance - Comments: Initially requires Mod A due to posterior LOB secondary to dizziness; progressing to Min guard. With looking up, pt with LOB backwards. Postural control: Posterior lean Standing balance support: During functional activity Standing balance-Leahy Scale: Poor Standing balance comment: Requires BUe support and external assist. Able  to perform marching in standing with Mod A.                            Cognition Arousal/Alertness: Awake/alert Behavior During Therapy: WFL for tasks assessed/performed Overall Cognitive Status: No family/caregiver present to  determine baseline cognitive functioning                                 General Comments: Oriented to being in the hospital. "someone beat me up." Thinks it is his birthday month in january.      Exercises      General Comments        Pertinent Vitals/Pain Pain Assessment: Faces Faces Pain Scale: No hurt    Home Living                      Prior Function            PT Goals (current goals can now be found in the care plan section) Progress towards PT goals: Progressing toward goals    Frequency    Min 2X/week      PT Plan Current plan remains appropriate;Frequency needs to be updated    Co-evaluation              AM-PAC PT "6 Clicks" Mobility   Outcome Measure  Help needed turning from your back to your side while in a flat bed without using bedrails?: A Little Help needed moving from lying on your back to sitting on the side of a flat bed without using bedrails?: A Lot Help needed moving to and from a bed to a chair (including a wheelchair)?: A Lot Help needed standing up from a chair using your arms (e.g., wheelchair or bedside chair)?: A Lot Help needed to walk in hospital room?: A Lot Help needed climbing 3-5 steps with a railing? : Total 6 Click Score: 12    End of Session Equipment Utilized During Treatment: Gait belt Activity Tolerance: Patient tolerated treatment well Patient left: in chair;with call bell/phone within reach;with chair alarm set Nurse Communication: Mobility status PT Visit Diagnosis: Unsteadiness on feet (R26.81);Muscle weakness (generalized) (M62.81);Difficulty in walking, not elsewhere classified (R26.2)     Time: [id number] PT Time Calculation (min) (ACUTE ONLY): 15 min  Charges:  $Therapeutic Activity: 8-22 mins                     Wray Kearns, PT, DPT Acute Rehabilitation Services Pager [phone] Office [place] 10/03/2019, 3:52 PM

## 2019-10-04 DIAGNOSIS — F0150 Vascular dementia without behavioral disturbance: Secondary | ICD-10-CM

## 2019-10-04 DIAGNOSIS — I482 Chronic atrial fibrillation, unspecified: Secondary | ICD-10-CM

## 2019-10-04 DIAGNOSIS — E785 Hyperlipidemia, unspecified: Secondary | ICD-10-CM

## 2019-10-04 DIAGNOSIS — I10 Essential (primary) hypertension: Secondary | ICD-10-CM

## 2019-10-04 DIAGNOSIS — R778 Other specified abnormalities of plasma proteins: Secondary | ICD-10-CM

## 2019-10-04 DIAGNOSIS — K219 Gastro-esophageal reflux disease without esophagitis: Secondary | ICD-10-CM

## 2019-10-04 DIAGNOSIS — J449 Chronic obstructive pulmonary disease, unspecified: Secondary | ICD-10-CM

## 2019-10-04 DIAGNOSIS — I5032 Chronic diastolic (congestive) heart failure: Secondary | ICD-10-CM

## 2019-10-04 DIAGNOSIS — N183 Chronic kidney disease, stage 3 unspecified: Secondary | ICD-10-CM

## 2019-10-04 DIAGNOSIS — R739 Hyperglycemia, unspecified: Secondary | ICD-10-CM

## 2019-10-04 LAB — CBC
HCT: 37.1 % — ABNORMAL LOW (ref 39.0–52.0)
Hemoglobin: 12.7 g/dL — ABNORMAL LOW (ref 13.0–17.0)
MCH: 32.2 pg (ref 26.0–34.0)
MCHC: 34.2 g/dL (ref 30.0–36.0)
MCV: 94.2 fL (ref 80.0–100.0)
Platelets: 167 10*3/uL (ref 150–400)
RBC: 3.94 MIL/uL — ABNORMAL LOW (ref 4.22–5.81)
RDW: 13.3 % (ref 11.5–15.5)
WBC: 8 10*3/uL (ref 4.0–10.5)
nRBC: 0 % (ref 0.0–0.2)

## 2019-10-04 LAB — BASIC METABOLIC PANEL
Anion gap: 11 (ref 5–15)
BUN: 13 mg/dL (ref 8–23)
CO2: 23 mmol/L (ref 22–32)
Calcium: 9.3 mg/dL (ref 8.9–10.3)
Chloride: 106 mmol/L (ref 98–111)
Creatinine, Ser: 1.18 mg/dL (ref 0.61–1.24)
GFR calc Af Amer: >60 mL/min (ref >60)
GFR calc non Af Amer: 54 mL/min — ABNORMAL LOW (ref >60)
Glucose, Bld: 130 mg/dL — ABNORMAL HIGH (ref 70–99)
Potassium: 3.9 mmol/L (ref 3.5–5.1)
Sodium: 140 mmol/L (ref 135–145)

## 2019-10-04 LAB — GLUCOSE, CAPILLARY
Glucose-Capillary: 121 mg/dL — ABNORMAL HIGH (ref 70–99)
Glucose-Capillary: 209 mg/dL — ABNORMAL HIGH (ref 70–99)
Glucose-Capillary: 209 mg/dL — ABNORMAL HIGH (ref 70–99)
Glucose-Capillary: 199 mg/dL — ABNORMAL HIGH (ref 70–99)

## 2019-10-04 LAB — MAGNESIUM: Magnesium: 1.8 mg/dL (ref 1.7–2.4)

## 2019-10-04 LAB — NOVEL CORONAVIRUS, NAA (HOSP ORDER, SEND-OUT TO REF LAB; TAT 18-24 HRS): SARS-CoV-2, NAA: NOT DETECTED

## 2019-10-04 NOTE — Progress Notes (Signed)
Physical Therapy Note   Discussed case with [name] [name], and reviewed PT notes, most recent from yesterday, 10/13;    [name] [name] comes from home where he has a caregiver(to assit with ADLs) 7 days per week to 8-5 and then another one come after 5pm to assist/prepare patient for bed. Prior to admission, he was able to feed his himself , walked short distances slowly w/walker, needed assistance getting in and out of bed.   He currently requires Moderate assist of 2 with bed mobility and transfers, and is able to participate in PT and OT sessions;     PT is recommending continuing post-acute rehabilitation at SNF to maximize independence and safety with mobility and ADLs, and facilitate return to PLOF;     Will continue to follow acutely,   [name] [name], [place] Pager [phone] Office [phone]

## 2019-10-04 NOTE — Progress Notes (Signed)
PROGRESS NOTE    DAVARIOUS BRAZ  [id number] DOB: 1926-02-28 DOA: 09/27/2019 PCP: Haywood Pao, MD   Brief Narrative:  HPI On 09/27/2019 by Dr. Toy Baker [name] [name] is a 85 y.o. male with medical history significant of chronic AFib not on anticoagulation, T2DM, stage III CKD, CVA with left side weakness, chronic HFpEF, COPD, and HTN, dementia  chronic diastolic heart failure, OSA not on CPAP  Presented with sudden tremor of upper extremity and fixated left gaze for around 10 seconds He could not raise left arm at first No neuro deficits and upon EMS arrival Patient has history of CVA with chronic left-sided weakness from prior stroke On arrival blood sugar was noted to be in 43s which is unusual for him blood pressure 118/88 satting 94% room air Patient was altered for about 3-4 minutes after the event No history of seizures activities in the past Patient unable to provide his own history son at bedside Provided history to emergency department physician  Admitted in March after a fall, had an MRI at that time showed no acute findings  Interim history Patient found to have bilateral saddle PE on CTA chest, started on IV heparin and transition to Xarelto.  Was also admitted for questionable seizure activity, EEG was unremarkable.  Currently pending SNF placement. Assessment & Plan   Acute bilateral saddle PE -CTA chest: Acute PE with CT evidence of right heart strain consistent with at least submassive PE -PCCM consulted and appreciated, cannot recommend thrombolytics.  Signed off on 09/28/2019 -Echocardiogram showed an EF of 6065%, normal LV function, global right ventricle and moderate reduced systolic function. -Lower extremity Dopplers were negative for DVT -Patient was placed on IV heparin for approximately 5 days and then transition to Xarelto on 10/02/2019  Concern for seizure activity -Neurology consulted and appreciated, previous hospitalist  discussed with neurology on time of admission and MRI was recommended along with echo and EEG.  If these were abnormal, would plan for formal neurological consultation.  Hold off on antiepileptics as patient did not have history of seizure. -CT head no acute intercranial abnormality -MRI brain showed motion degraded study without acute findings -EEG: No seizures or left form discharges were seen throughout the recording -No further events while patient has been hospitalized  Demand ischemia -Suspect secondary to pulmonary embolism -Troponin peaked at 139 and trended downward -Echocardiogram without wall motion abnormality  Chronic atrial fibrillation -CHADSVASC 5 -Patient was on Xarelto previously however this was discontinued due to medical noncompliance -As above, patient has been transitioned from heparin to Xarelto  Hyperlipidemia -Continue statin  Chronic kidney disease, stage III -Baseline creatinine approximate 1.4-1.7 -Currently stable  Chronic diastolic heart failure -Euvolemic at this time, BNP was 154 -Metoprolol was held as patient had an episode of nonsustained bradycardia on admission  Asymptomatic pyuria -Urine culture positive for Kocuria Rosea >100K -Previous hospitalist discussed with Dr. De Burrs, infectious disease, given that patient has no symptoms would not treat with antibiotics -Rocephin was discontinued  Diabetes mellitus, type II -Amaryl held -Continue Lantus, insulin sliding scale, CBG monitoring  Hypokalemia -resolved, currently 3.9  Dementia -Stable, continue Aricept  DVT Prophylaxis  Xarelto  Code Status: DNR  Family Communication: None at bedside  Disposition Plan: Admitted. Pending SNF placement and COVID test.  Consultants PCCM  Procedures  Echocardiogram Lower extremity Doppler  Antibiotics   Anti-infectives (From admission, onward)   Start     Dose/Rate Route Frequency Ordered Stop   09/28/19 1000  cefTRIAXone (ROCEPHIN)  1 g  in sodium chloride 0.9 % 100 mL IVPB  Status:  Discontinued     1 g 200 mL/hr over 30 Minutes Intravenous Every 24 hours 09/28/19 0839 09/30/19 [id number]      Subjective:   [name] [name] seen and examined today.  Patient with no complaints this morning.  Denies current chest pain, shortness breath, abdominal pain, nausea or vomiting, diarrhea constipation, dizziness or headache.  Objective:   Vitals:   10/03/19 0744 10/03/19 1600 10/03/19 1958 10/04/19 0430  BP: 123/85  126/90 124/73  Pulse: 79 95 (!) 101 87  Resp: 17 18 19    Temp: (!) 97.5 F (36.4 C) (!) 97.3 F (36.3 C) 98.2 F (36.8 C) (!) 97.3 F (36.3 C)  TempSrc: Oral Oral Oral Oral  SpO2: 95%  94% 91%  Weight:      Height:        Intake/Output Summary (Last 24 hours) at 10/04/2019 1121 Last data filed at 10/04/2019 0438 Gross per 24 hour  Intake -  Output 1050 ml  Net -1050 ml   Filed Weights   09/27/19 2146  Weight: 72.6 kg    Exam  General: Well developed, well nourished, elderly, NAD, appears stated age  57: NCAT, mucous membranes moist.   Cardiovascular: S1 S2 auscultated, irregular  Respiratory: Clear to auscultation bilaterally  Abdomen: Soft, nontender, nondistended, + bowel sounds  Extremities: warm dry without cyanosis clubbing or edema  Neuro: AAOx3, nonfocal  Psych: Pleasant, appropriate mood and affect   Data Reviewed: I have personally reviewed following labs and imaging studies  CBC: Recent Labs  Lab 09/27/19 2211  09/30/19 0322 10/01/19 0733 10/02/19 1006 10/02/19 1038 10/04/19 0500  WBC 15.4*   < > 8.7 8.4 7.2 6.9 8.0  NEUTROABS 13.2*  --   --   --   --   --   --   HGB 13.2   < > 12.6* 13.1 12.5* 12.0* 12.7*  HCT 39.7   < > 36.4* 38.0* 36.5* 35.5* 37.1*  MCV 97.1   < > 95.0 94.3 93.4 94.4 94.2  PLT 192   < > 141* 152 150 149* 167   < > = values in this interval not displayed.   Basic Metabolic Panel: Recent Labs  Lab 09/27/19 2211  09/28/19 0417  10/01/19 0733  10/02/19 1006 10/02/19 1038 10/03/19 0310 10/04/19 0500  NA 135   < > 136   < > 139 136 135 139 140  K 4.9   < > 3.9   < > 4.1 3.2* 3.3* 3.4* 3.9  CL 104   < > 102   < > 105 102 103 104 106  CO2 20*  --  24   < > 23 23 22 24 23   GLUCOSE 351*   < > 284*   < > 147* 207* 224* 154* 130*  BUN 21   < > 18   < > 13 12 13 13 13   CREATININE 1.50*   < > 1.40*   < > 1.46* 1.32* 1.30* 1.44* 1.18  CALCIUM 8.8*  --  8.9   < > 9.3 8.7* 8.7* 9.1 9.3  MG 2.0  --  2.1  --   --   --   --   --  1.8  PHOS  --   --  3.5  --   --   --   --   --   --    < > = values in this interval  not displayed.   GFR: Estimated Creatinine Clearance: 39.4 mL/min (by C-G formula based on SCr of 1.18 mg/dL). Liver Function Tests: Recent Labs  Lab 09/27/19 2211 09/28/19 0417  AST 33 17  ALT 22 22  ALKPHOS 101 98  BILITOT 1.7* 0.9  PROT 6.4* 6.4*  ALBUMIN 3.4* 3.1*   No results for input(s): LIPASE, AMYLASE in the last 168 hours. No results for input(s): AMMONIA in the last 168 hours. Coagulation Profile: No results for input(s): INR, PROTIME in the last 168 hours. Cardiac Enzymes: No results for input(s): CKTOTAL, CKMB, CKMBINDEX, TROPONINI in the last 168 hours. BNP (last 3 results) No results for input(s): PROBNP in the last 8760 hours. HbA1C: No results for input(s): HGBA1C in the last 72 hours. CBG: Recent Labs  Lab 10/03/19 0622 10/03/19 1106 10/03/19 1608 10/03/19 2106 10/04/19 0633  GLUCAP 131* 203* 157* 146* 121*   Lipid Profile: No results for input(s): CHOL, HDL, LDLCALC, TRIG, CHOLHDL, LDLDIRECT in the last 72 hours. Thyroid Function Tests: No results for input(s): TSH, T4TOTAL, FREET4, T3FREE, THYROIDAB in the last 72 hours. Anemia Panel: No results for input(s): VITAMINB12, FOLATE, FERRITIN, TIBC, IRON, RETICCTPCT in the last 72 hours. Urine analysis:    Component Value Date/Time   COLORURINE YELLOW 09/28/2019 0205   APPEARANCEUR HAZY (A) 09/28/2019 0205   LABSPEC 1.029 09/28/2019  0205   PHURINE 5.0 09/28/2019 0205   GLUCOSEU >=500 (A) 09/28/2019 0205   HGBUR NEGATIVE 09/28/2019 0205   BILIRUBINUR NEGATIVE 09/28/2019 0205   KETONESUR 5 (A) 09/28/2019 0205   PROTEINUR NEGATIVE 09/28/2019 0205   UROBILINOGEN 1.0 01/21/2012 2347   NITRITE NEGATIVE 09/28/2019 0205   LEUKOCYTESUR LARGE (A) 09/28/2019 0205   Sepsis Labs: @LABRCNTIP (procalcitonin:4,lacticidven:4)  ) Recent Results (from the past 240 hour(s))  SARS Coronavirus 2 [facility] order, Performed in [facility] hospital lab) Nasopharyngeal Nasopharyngeal Swab     Status: None   Collection Time: 09/27/19 10:05 PM   Specimen: Nasopharyngeal Swab  Result Value Ref Range Status   SARS Coronavirus 2 NEGATIVE NEGATIVE Final    Comment: (NOTE) If result is NEGATIVE SARS-CoV-2 target nucleic acids are NOT DETECTED. The SARS-CoV-2 RNA is generally detectable in upper and lower  respiratory specimens during the acute phase of infection. The lowest  concentration of SARS-CoV-2 viral copies this assay can detect is 250  copies / mL. A negative result does not preclude SARS-CoV-2 infection  and should not be used as the sole basis for treatment or other  patient management decisions.  A negative result may occur with  improper specimen collection / handling, submission of specimen other  than nasopharyngeal swab, presence of viral mutation(s) within the  areas targeted by this assay, and inadequate number of viral copies  (<250 copies / mL). A negative result must be combined with clinical  observations, patient history, and epidemiological information. If result is POSITIVE SARS-CoV-2 target nucleic acids are DETECTED. The SARS-CoV-2 RNA is generally detectable in upper and lower  respiratory specimens dur ing the acute phase of infection.  Positive  results are indicative of active infection with SARS-CoV-2.  Clinical  correlation with patient history and other diagnostic information is  necessary to determine  patient infection status.  Positive results do  not rule out bacterial infection or co-infection with other viruses. If result is PRESUMPTIVE POSTIVE SARS-CoV-2 nucleic acids MAY BE PRESENT.   A presumptive positive result was obtained on the submitted specimen  and confirmed on repeat testing.  While 2019 novel coronavirus  (  SARS-CoV-2) nucleic acids may be present in the submitted sample  additional confirmatory testing may be necessary for epidemiological  and / or clinical management purposes  to differentiate between  SARS-CoV-2 and other Sarbecovirus currently known to infect humans.  If clinically indicated additional testing with an alternate test  methodology [phone]) is advised. The SARS-CoV-2 RNA is generally  detectable in upper and lower respiratory sp ecimens during the acute  phase of infection. The expected result is Negative. Fact Sheet for Patients:  StrictlyIdeas.no Fact Sheet for Healthcare Providers: BankingDealers.co.za This test is not yet approved or cleared by the [place] FDA and has been authorized for detection and/or diagnosis of SARS-CoV-2 by FDA under an Emergency Use Authorization (EUA).  This EUA will remain in effect (meaning this test can be used) for the duration of the COVID-19 declaration under Section 564(b)(1) of the Act, 21 U.S.C. section 360bbb-3(b)(1), unless the authorization is terminated or revoked sooner. Performed at [facility], [place] [address], [place], [place] [address]   Culture, Urine     Status: Abnormal   Collection Time: 09/28/19  2:05 AM   Specimen: Urine, Random  Result Value Ref Range Status   Specimen Description URINE, RANDOM  Final   Special Requests NONE  Final   Culture (A)  Final    >=100,000 COLONIES/mL KOCURIA ROSEA Standardized susceptibility testing for this organism is not available. Performed at [facility], [place] [address], [place], [place]  [address]    Report Status 09/30/2019 FINAL  Final      Radiology Studies: No results found.   Scheduled Meds: . atorvastatin  20 mg Oral q1800  . donepezil  10 mg Oral QHS  . insulin aspart  0-9 Units Subcutaneous TID WC  . insulin glargine  20 Units Subcutaneous Daily  . [START ON 10/12/2019] rivaroxaban  20 mg Oral Q supper   Followed by  . rivaroxaban  15 mg Oral BID   Continuous Infusions:   LOS: 6 days   Time Spent in minutes   30 minutes  Markeria Goetsch D.O. on 10/04/2019 at 11:21 AM  Between 7am to 7pm - Please see pager noted on [email or web address]  After 7pm go to [email or web address]  And look for the night coverage person covering for me after hours  [facility] Office  [phone]

## 2019-10-04 NOTE — Plan of Care (Signed)

## 2019-10-05 LAB — GLUCOSE, CAPILLARY
Glucose-Capillary: 145 mg/dL — ABNORMAL HIGH (ref 70–99)
Glucose-Capillary: 278 mg/dL — ABNORMAL HIGH (ref 70–99)
Glucose-Capillary: 195 mg/dL — ABNORMAL HIGH (ref 70–99)
Glucose-Capillary: 129 mg/dL — ABNORMAL HIGH (ref 70–99)

## 2019-10-05 NOTE — Care Management Important Message (Signed)
Important Message  Patient Details  Name: SIDDH CRENSHAW MRN: [id number] Date of Birth: 13-Jan-1926   Medicare Important Message Given:  Yes     [name] 10/05/2019, 2:04 PM

## 2019-10-05 NOTE — Progress Notes (Signed)
ANTICOAGULATION CONSULT NOTE - Follow Up Consult  Pharmacy Consult for Eliquis Indication: pulmonary embolus  Allergies  Allergen Reactions  . Sulfa Antibiotics Swelling    Swelling of hands    Patient Measurements: Height: 5\' 8"  (172.7 cm) Weight: 160 lb (72.6 kg) IBW/kg (Calculated) : 68.4  Vital Signs: Temp: 97.4 F (36.3 C) (10/15 1124) Temp Source: Oral (10/15 1124) BP: 112/75 (10/15 1124) Pulse Rate: 81 (10/15 1124)  Labs: Recent Labs    10/03/19 0310 10/04/19 0500  HGB  --  12.7*  HCT  --  37.1*  PLT  --  167  CREATININE 1.44* 1.18    Estimated Creatinine Clearance: 39.4 mL/min (by C-G formula based on SCr of 1.18 mg/dL).  Assessment:  Anticoag: h/o afib (no anticoag PTA) now with BL saddle PE (no DVTs). Also hx afib (no AC PTA). Hgb 12.7 stable. Plts 167 stable. CHA2DS2-VASc score 5. Changed to Xarelto on 10/12  Goal of Therapy:  Therapeutic oral anticoagulation   Plan:  - Xarelto 15mg  BID x 21d, then 20mg  daily. -planning SNF discharge   [name] [name] [name] [name], PharmD, [name] [name] [name] [name] 10/05/2019,12:18 PM

## 2019-10-05 NOTE — Progress Notes (Signed)
PROGRESS NOTE    [name] [name]  [id number] DOB: Oct 07, 1926 DOA: 09/27/2019 PCP: Haywood Pao, MD   Brief Narrative:  HPI On 09/27/2019 by Dr. Toy Baker [name] [name] is a 85 y.o. male with medical history significant of chronic AFib not on anticoagulation, T2DM, stage III CKD, CVA with left side weakness, chronic HFpEF, COPD, and HTN, dementia  chronic diastolic heart failure, OSA not on CPAP  Presented with sudden tremor of upper extremity and fixated left gaze for around 10 seconds He could not raise left arm at first No neuro deficits and upon EMS arrival Patient has history of CVA with chronic left-sided weakness from prior stroke On arrival blood sugar was noted to be in 70s which is unusual for him blood pressure 118/88 satting 94% room air Patient was altered for about 3-4 minutes after the event No history of seizures activities in the past Patient unable to provide his own history son at bedside Provided history to emergency department physician  Admitted in March after a fall, had an MRI at that time showed no acute findings  Interim history Patient found to have bilateral saddle PE on CTA chest, started on IV heparin and transition to Xarelto.  Was also admitted for questionable seizure activity, EEG was unremarkable.  Currently pending SNF placement. Assessment & Plan   Acute bilateral saddle PE -CTA chest: Acute PE with CT evidence of right heart strain consistent with at least submassive PE -PCCM consulted and appreciated, cannot recommend thrombolytics.  Signed off on 09/28/2019 -Echocardiogram showed an EF of 6065%, normal LV function, global right ventricle and moderate reduced systolic function. -Lower extremity Dopplers were negative for DVT -Patient was placed on IV heparin for approximately 5 days and then transition to Xarelto on 10/02/2019  Concern for seizure activity -Neurology consulted and appreciated, previous hospitalist  discussed with neurology on time of admission and MRI was recommended along with echo and EEG.  If these were abnormal, would plan for formal neurological consultation.  Hold off on antiepileptics as patient did not have history of seizure. -CT head no acute intercranial abnormality -MRI brain showed motion degraded study without acute findings -EEG: No seizures or left form discharges were seen throughout the recording -No further events while patient has been hospitalized  Demand ischemia -Suspect secondary to pulmonary embolism -Troponin peaked at 139 and trended downward -Echocardiogram without wall motion abnormality  Chronic atrial fibrillation -CHADSVASC 5 -Patient was on Xarelto previously however this was discontinued due to medical noncompliance -As above, patient has been transitioned from heparin to Xarelto  Hyperlipidemia -Continue statin  Chronic kidney disease, stage IIIa -Baseline creatinine approximate 1.4-1.7 -Currently stable  Chronic diastolic heart failure -Euvolemic at this time, BNP was 154 -Metoprolol was held as patient had an episode of nonsustained bradycardia on admission  Asymptomatic pyuria -Urine culture positive for Kocuria Rosea >100K -Previous hospitalist discussed with Dr. De Burrs, infectious disease, given that patient has no symptoms would not treat with antibiotics -Rocephin was discontinued  Diabetes mellitus, type II -Amaryl held -Continue Lantus, insulin sliding scale, CBG monitoring  Hypokalemia -resolved, currently 3.9  Dementia -Stable, continue Aricept  DVT Prophylaxis  Xarelto  Code Status: DNR  Family Communication: None at bedside  Disposition Plan: Admitted. Pending SNF placement. COVID test negative  Consultants PCCM  Procedures  Echocardiogram Lower extremity Doppler  Antibiotics   Anti-infectives (From admission, onward)   Start     Dose/Rate Route Frequency Ordered Stop   09/28/19 1000  cefTRIAXone  (  ROCEPHIN) 1 g in sodium chloride 0.9 % 100 mL IVPB  Status:  Discontinued     1 g 200 mL/hr over 30 Minutes Intravenous Every 24 hours 09/28/19 0839 09/30/19 [id number]      Subjective:   [name] [name] seen and examined today.  Patient feels very hungry this morning.  Denies current chest pain, shortness breath, abdominal pain, nausea or vomiting, diarrhea or constipation, dizziness or headache. Objective:   Vitals:   10/04/19 0430 10/04/19 1200 10/04/19 2037 10/05/19 0431  BP: 124/73 109/66 118/75 (!) 148/91  Pulse: 87 97 96 95  Resp:   18   Temp: (!) 97.3 F (36.3 C) 97.8 F (36.6 C) 97.8 F (36.6 C) 97.7 F (36.5 C)  TempSrc: Oral Oral Oral Oral  SpO2: 91% 96% 94% 94%  Weight:      Height:        Intake/Output Summary (Last 24 hours) at 10/05/2019 1031 Last data filed at 10/05/2019 0500 Gross per 24 hour  Intake 720 ml  Output 750 ml  Net -30 ml   Filed Weights   09/27/19 2146  Weight: 72.6 kg   Exam  General: Well developed, well nourished, elderly, NAD, appears stated age  74: NCAT, mucous membranes moist.   Neck: Supple, no JVD, no masses  Cardiovascular: S1 S2 auscultated, irregular  Respiratory: Clear to auscultation bilaterally  Abdomen: Soft, nontender, nondistended, + bowel sounds  Extremities: warm dry without cyanosis clubbing or edema  Neuro: AAOx2, nonfocal, short-term memory loss  Psych: Pleasant, appropriate mood and affect  Data Reviewed: I have personally reviewed following labs and imaging studies  CBC: Recent Labs  Lab 09/30/19 0322 10/01/19 0733 10/02/19 1006 10/02/19 1038 10/04/19 0500  WBC 8.7 8.4 7.2 6.9 8.0  HGB 12.6* 13.1 12.5* 12.0* 12.7*  HCT 36.4* 38.0* 36.5* 35.5* 37.1*  MCV 95.0 94.3 93.4 94.4 94.2  PLT 141* 152 150 149* [id number]   Basic Metabolic Panel: Recent Labs  Lab 10/01/19 0733 10/02/19 1006 10/02/19 1038 10/03/19 0310 10/04/19 0500  NA 139 136 135 139 140  K 4.1 3.2* 3.3* 3.4* 3.9  CL 105 102 103 104  106  CO2 23 23 22 24 23   GLUCOSE 147* 207* 224* 154* 130*  BUN 13 12 13 13 13   CREATININE 1.46* 1.32* 1.30* 1.44* 1.18  CALCIUM 9.3 8.7* 8.7* 9.1 9.3  MG  --   --   --   --  1.8   GFR: Estimated Creatinine Clearance: 39.4 mL/min (by C-G formula based on SCr of 1.18 mg/dL). Liver Function Tests: No results for input(s): AST, ALT, ALKPHOS, BILITOT, PROT, ALBUMIN in the last 168 hours. No results for input(s): LIPASE, AMYLASE in the last 168 hours. No results for input(s): AMMONIA in the last 168 hours. Coagulation Profile: No results for input(s): INR, PROTIME in the last 168 hours. Cardiac Enzymes: No results for input(s): CKTOTAL, CKMB, CKMBINDEX, TROPONINI in the last 168 hours. BNP (last 3 results) No results for input(s): PROBNP in the last 8760 hours. HbA1C: No results for input(s): HGBA1C in the last 72 hours. CBG: Recent Labs  Lab 10/04/19 0633 10/04/19 1201 10/04/19 1617 10/04/19 2119 10/05/19 0605  GLUCAP 121* 209* 209* 199* 145*   Lipid Profile: No results for input(s): CHOL, HDL, LDLCALC, TRIG, CHOLHDL, LDLDIRECT in the last 72 hours. Thyroid Function Tests: No results for input(s): TSH, T4TOTAL, FREET4, T3FREE, THYROIDAB in the last 72 hours. Anemia Panel: No results for input(s): VITAMINB12, FOLATE, FERRITIN, TIBC, IRON, RETICCTPCT in the  last 72 hours. Urine analysis:    Component Value Date/Time   COLORURINE YELLOW 09/28/2019 0205   APPEARANCEUR HAZY (A) 09/28/2019 0205   LABSPEC 1.029 09/28/2019 0205   PHURINE 5.0 09/28/2019 0205   GLUCOSEU >=500 (A) 09/28/2019 0205   HGBUR NEGATIVE 09/28/2019 0205   BILIRUBINUR NEGATIVE 09/28/2019 0205   KETONESUR 5 (A) 09/28/2019 0205   PROTEINUR NEGATIVE 09/28/2019 0205   UROBILINOGEN 1.0 01/21/2012 2347   NITRITE NEGATIVE 09/28/2019 0205   LEUKOCYTESUR LARGE (A) 09/28/2019 0205   Sepsis Labs: @LABRCNTIP (procalcitonin:4,lacticidven:4)  ) Recent Results (from the past 240 hour(s))  SARS Coronavirus 2  [facility] order, Performed in [facility] hospital lab) Nasopharyngeal Nasopharyngeal Swab     Status: None   Collection Time: 09/27/19 10:05 PM   Specimen: Nasopharyngeal Swab  Result Value Ref Range Status   SARS Coronavirus 2 NEGATIVE NEGATIVE Final    Comment: (NOTE) If result is NEGATIVE SARS-CoV-2 target nucleic acids are NOT DETECTED. The SARS-CoV-2 RNA is generally detectable in upper and lower  respiratory specimens during the acute phase of infection. The lowest  concentration of SARS-CoV-2 viral copies this assay can detect is 250  copies / mL. A negative result does not preclude SARS-CoV-2 infection  and should not be used as the sole basis for treatment or other  patient management decisions.  A negative result may occur with  improper specimen collection / handling, submission of specimen other  than nasopharyngeal swab, presence of viral mutation(s) within the  areas targeted by this assay, and inadequate number of viral copies  (<250 copies / mL). A negative result must be combined with clinical  observations, patient history, and epidemiological information. If result is POSITIVE SARS-CoV-2 target nucleic acids are DETECTED. The SARS-CoV-2 RNA is generally detectable in upper and lower  respiratory specimens dur ing the acute phase of infection.  Positive  results are indicative of active infection with SARS-CoV-2.  Clinical  correlation with patient history and other diagnostic information is  necessary to determine patient infection status.  Positive results do  not rule out bacterial infection or co-infection with other viruses. If result is PRESUMPTIVE POSTIVE SARS-CoV-2 nucleic acids MAY BE PRESENT.   A presumptive positive result was obtained on the submitted specimen  and confirmed on repeat testing.  While 2019 novel coronavirus  (SARS-CoV-2) nucleic acids may be present in the submitted sample  additional confirmatory testing may be necessary for  epidemiological  and / or clinical management purposes  to differentiate between  SARS-CoV-2 and other Sarbecovirus currently known to infect humans.  If clinically indicated additional testing with an alternate test  methodology [phone]) is advised. The SARS-CoV-2 RNA is generally  detectable in upper and lower respiratory sp ecimens during the acute  phase of infection. The expected result is Negative. Fact Sheet for Patients:  StrictlyIdeas.no Fact Sheet for Healthcare Providers: BankingDealers.co.za This test is not yet approved or cleared by the [place] FDA and has been authorized for detection and/or diagnosis of SARS-CoV-2 by FDA under an Emergency Use Authorization (EUA).  This EUA will remain in effect (meaning this test can be used) for the duration of the COVID-19 declaration under Section 564(b)(1) of the Act, 21 U.S.C. section 360bbb-3(b)(1), unless the authorization is terminated or revoked sooner. Performed at [facility], [place] [address] [place], [place] [address]   Culture, Urine     Status: Abnormal   Collection Time: 09/28/19  2:05 AM   Specimen: Urine, Random  Result Value Ref  Range Status   Specimen Description URINE, RANDOM  Final   Special Requests NONE  Final   Culture (A)  Final    >=100,000 COLONIES/mL KOCURIA ROSEA Standardized susceptibility testing for this organism is not available. Performed at [facility], [place] [address], [place], [place] [address]    Report Status 09/30/2019 FINAL  Final  Novel Coronavirus, NAA (Hosp order, Send-out to Ref Lab; TAT 18-24 hrs     Status: None   Collection Time: 10/03/19 11:23 AM   Specimen: Nasopharyngeal Swab; Respiratory  Result Value Ref Range Status   SARS-CoV-2, NAA NOT DETECTED NOT DETECTED Final    Comment: (NOTE) This nucleic acid amplification test was developed and its performance characteristics determined by [employer]. Nucleic acid amplification tests include PCR and TMA. This test has not been FDA cleared or approved. This test has been authorized by FDA under an Emergency Use Authorization (EUA). This test is only authorized for the duration of time the declaration that circumstances exist justifying the authorization of the emergency use of in vitro diagnostic tests for detection of SARS-CoV-2 virus and/or diagnosis of COVID-19 infection under section 564(b)(1) of the Act, 21 U.S.C. [id number]) (1), unless the authorization is terminated or revoked sooner. When diagnostic testing is negative, the possibility of a false negative result should be considered in the context of a patient's recent exposures and the presence of clinical signs and symptoms consistent with COVID-19. An individual without symptoms of COVID- 19 and who is not shedding SARS-CoV-2 vi rus would expect to have a negative (not detected) result in this assay. Performed At: [facility] [address] [place], [place] [id number] Rush Farmer MD [id number]    [place]  Final    Comment: Performed at [facility], [place] [address] [place], [place] [address]      Radiology Studies: No results found.   Scheduled Meds:  atorvastatin  20 mg Oral q1800   donepezil  10 mg Oral QHS   insulin aspart  0-9 Units Subcutaneous TID WC   insulin glargine  20 Units Subcutaneous Daily   [START ON 10/12/2019] rivaroxaban  20 mg Oral Q supper   Followed by   rivaroxaban  15 mg Oral BID   Continuous Infusions:   LOS: 7 days   Time Spent in minutes   30 minutes  [name] [name] D.O. on 10/05/2019 at 10:31 AM  Between 7am to 7pm - Please see pager noted on [email or web address]  After 7pm go to [email or web address]  And look for the night coverage person covering for me after hours  [facility] Office  [phone]

## 2019-10-05 NOTE — Progress Notes (Signed)
Care plan reviewed, Pt is progressing. Pt appeared alert, awake, oriented to self and situation, forgetful, disoriented to place, person, and time. He's able to follow commands. No neuro deficits at shift assessment, moderate to strong motor power 4 extremities, no left side weakness. Vital signs remained stable, afebrile. No seizures tonight.  SPO2 94-96% on room air. Atrial fib rated under controlled on monitor, HR 80s-90s, no acute distress noted. Continue to monitor.  Kennyth Lose, RN

## 2019-10-05 NOTE — TOC Progression Note (Signed)
Transition of Care [facility]) - Progression Note    Patient Details  Name: [name] [name] MRN: [id number] Date of Birth: 07-30-1926  [facility] of [facility]) CM/SW [place], [place] Phone Number: 10/05/2019, 3:25 PM  Clinical Narrative:     CSW was informed by SNF patient's insurance is under review- insurance remains pending.  Thurmond Butts, MSW, [facility] Clinical Social Worker [phone]   Expected Discharge Plan: Skilled Nursing Facility Barriers to Discharge: Insurance Authorization, SNF Pending bed offer  Expected Discharge Plan and Services Expected Discharge Plan: [place] arrangements for the past 2 months: Single Family Home                                       Social Determinants of Health (SDOH) Interventions    Readmission Risk Interventions No flowsheet data found.

## 2019-10-06 DIAGNOSIS — F339 Major depressive disorder, recurrent, unspecified: Secondary | ICD-10-CM | POA: Diagnosis not present

## 2019-10-06 DIAGNOSIS — I4891 Unspecified atrial fibrillation: Secondary | ICD-10-CM | POA: Diagnosis not present

## 2019-10-06 DIAGNOSIS — Z743 Need for continuous supervision: Secondary | ICD-10-CM | POA: Diagnosis not present

## 2019-10-06 DIAGNOSIS — J449 Chronic obstructive pulmonary disease, unspecified: Secondary | ICD-10-CM | POA: Diagnosis not present

## 2019-10-06 DIAGNOSIS — F0390 Unspecified dementia without behavioral disturbance: Secondary | ICD-10-CM | POA: Diagnosis not present

## 2019-10-06 DIAGNOSIS — R092 Respiratory arrest: Secondary | ICD-10-CM | POA: Diagnosis not present

## 2019-10-06 DIAGNOSIS — R0902 Hypoxemia: Secondary | ICD-10-CM | POA: Diagnosis not present

## 2019-10-06 DIAGNOSIS — L97829 Non-pressure chronic ulcer of other part of left lower leg with unspecified severity: Secondary | ICD-10-CM | POA: Diagnosis not present

## 2019-10-06 DIAGNOSIS — E08311 Diabetes mellitus due to underlying condition with unspecified diabetic retinopathy with macular edema: Secondary | ICD-10-CM | POA: Diagnosis not present

## 2019-10-06 DIAGNOSIS — I10 Essential (primary) hypertension: Secondary | ICD-10-CM | POA: Diagnosis not present

## 2019-10-06 DIAGNOSIS — N179 Acute kidney failure, unspecified: Secondary | ICD-10-CM | POA: Diagnosis not present

## 2019-10-06 DIAGNOSIS — I2602 Saddle embolus of pulmonary artery with acute cor pulmonale: Secondary | ICD-10-CM | POA: Diagnosis not present

## 2019-10-06 DIAGNOSIS — D72829 Elevated white blood cell count, unspecified: Secondary | ICD-10-CM | POA: Diagnosis not present

## 2019-10-06 DIAGNOSIS — I5030 Unspecified diastolic (congestive) heart failure: Secondary | ICD-10-CM | POA: Diagnosis not present

## 2019-10-06 DIAGNOSIS — N1831 Chronic kidney disease, stage 3a: Secondary | ICD-10-CM | POA: Diagnosis not present

## 2019-10-06 DIAGNOSIS — R2689 Other abnormalities of gait and mobility: Secondary | ICD-10-CM | POA: Diagnosis not present

## 2019-10-06 DIAGNOSIS — N183 Chronic kidney disease, stage 3 unspecified: Secondary | ICD-10-CM | POA: Diagnosis not present

## 2019-10-06 DIAGNOSIS — G459 Transient cerebral ischemic attack, unspecified: Secondary | ICD-10-CM | POA: Diagnosis not present

## 2019-10-06 DIAGNOSIS — E119 Type 2 diabetes mellitus without complications: Secondary | ICD-10-CM | POA: Diagnosis not present

## 2019-10-06 DIAGNOSIS — F329 Major depressive disorder, single episode, unspecified: Secondary | ICD-10-CM | POA: Diagnosis not present

## 2019-10-06 DIAGNOSIS — G4730 Sleep apnea, unspecified: Secondary | ICD-10-CM | POA: Diagnosis not present

## 2019-10-06 DIAGNOSIS — I5032 Chronic diastolic (congestive) heart failure: Secondary | ICD-10-CM | POA: Diagnosis not present

## 2019-10-06 DIAGNOSIS — G4089 Other seizures: Secondary | ICD-10-CM | POA: Diagnosis not present

## 2019-10-06 DIAGNOSIS — R0689 Other abnormalities of breathing: Secondary | ICD-10-CM | POA: Diagnosis not present

## 2019-10-06 DIAGNOSIS — M6281 Muscle weakness (generalized): Secondary | ICD-10-CM | POA: Diagnosis not present

## 2019-10-06 DIAGNOSIS — R279 Unspecified lack of coordination: Secondary | ICD-10-CM | POA: Diagnosis not present

## 2019-10-06 DIAGNOSIS — I739 Peripheral vascular disease, unspecified: Secondary | ICD-10-CM | POA: Diagnosis not present

## 2019-10-06 DIAGNOSIS — R488 Other symbolic dysfunctions: Secondary | ICD-10-CM | POA: Diagnosis not present

## 2019-10-06 DIAGNOSIS — I2692 Saddle embolus of pulmonary artery without acute cor pulmonale: Secondary | ICD-10-CM | POA: Diagnosis not present

## 2019-10-06 DIAGNOSIS — F0280 Dementia in other diseases classified elsewhere without behavioral disturbance: Secondary | ICD-10-CM | POA: Diagnosis not present

## 2019-10-06 DIAGNOSIS — I482 Chronic atrial fibrillation, unspecified: Secondary | ICD-10-CM | POA: Diagnosis not present

## 2019-10-06 LAB — GLUCOSE, CAPILLARY
Glucose-Capillary: 140 mg/dL — ABNORMAL HIGH (ref 70–99)
Glucose-Capillary: 165 mg/dL — ABNORMAL HIGH (ref 70–99)

## 2019-10-06 MED ORDER — INSULIN ASPART 100 UNIT/ML ~~LOC~~ SOLN: 0.0000 [IU] | Freq: Three times a day (TID) | SUBCUTANEOUS | Status: DC

## 2019-10-06 MED ORDER — RIVAROXABAN 20 MG PO TABS: 20.0000 mg | ORAL_TABLET | Freq: Every day | ORAL | Status: DC

## 2019-10-06 MED ORDER — RIVAROXABAN 15 MG PO TABS: 15.0000 mg | ORAL_TABLET | Freq: Two times a day (BID) | ORAL | Status: DC

## 2019-10-06 MED ORDER — INSULIN GLARGINE 100 UNIT/ML ~~LOC~~ SOLN: 20.0000 [IU] | Freq: Every day | SUBCUTANEOUS | Status: DC

## 2019-10-06 NOTE — Progress Notes (Signed)
Care plan reviewed, Pt is progressing. Vital signs remain stable, afebrile, room air SPO2 95-97%. Lungs clear, no acute distress tonight. Atrial fibrillation on monitor, heart rate under controlled 80s-90s.   No seizure episode. Neuro check had previous neuro deficits from old CVA, found 2 limbs ataxia on left side, left arm weakness, right leg and left leg strength and motor power 4/5. Pt stated he had some pain and limited movement previously. Stable dementia, continue to be forgetful and repeatedly asked same questions, talkative with lots of sense of humor.   Urine incontinent, he has been using condom cath, had good amount of urine output. Continue to monitor.   Kennyth Lose, RN

## 2019-10-06 NOTE — Discharge Summary (Signed)
Physician Discharge Summary  [name] [name] [id number] DOB: 10-22-1926 DOA: 09/27/2019  PCP: Haywood Pao, MD  Admit date: 09/27/2019 Discharge date: 10/06/2019  Time spent: 45 minutes  Recommendations for Outpatient Follow-up:  Patient will be discharged to skilled nursing facility. Continue physical and occupational therapy.  Patient will need to follow up with primary care provider within one week of discharge.  Patient should continue medications as prescribed.  Patient should follow a heart healthy/carb modified diet.   Discharge Diagnoses:  Acute bilateral saddle PE Concern for seizure activity Demand ischemia Chronic atrial fibrillation Hyperlipidemia Chronic kidney disease, stage IIIa Chronic diastolic heart failure Asymptomatic pyuria Diabetes mellitus, type II Hypokalemia Dementia  Discharge Condition: Stable  Diet recommendation: heart healthy/carb modified  Filed Weights   09/27/19 2146  Weight: 72.6 kg    History of present illness:  On 09/27/2019 by Dr. Gerald Dexter a 85 y.o.malewith medical history significant of chronic AFib not on anticoagulation, T2DM, stage III CKD, CVAwith left side weakness, chronic HFpEF, COPD, and HTN, dementia chronic diastolic heart failure,OSA not on CPAP  Presented withsudden tremor of upper extremity and fixated left gaze for around 10 seconds He could not raise left arm at first No neuro deficits and upon EMS arrival Patient has history of CVA with chronic left-sided weakness from prior stroke On arrival blood sugar was noted to be in 370swhich is unusual for himblood pressure 118/88 satting 94% room air Patient was altered for about 3-21minutes after the event No history of seizures activities in the past Patient unable to provide his own history son at bedside Provided history to emergency department physician  Admitted in March after a fall,had an MRI at that time showed  no acute findings  Hospital Course:  Acute bilateral saddle PE -CTA chest: Acute PE with CT evidence of right heart strain consistent with at least submassive PE -PCCM consulted and appreciated, cannot recommend thrombolytics.  Signed off on 09/28/2019 -Echocardiogram showed an EF of 6065%, normal LV function, global right ventricle and moderate reduced systolic function. -Lower extremity Dopplers were negative for DVT -Patient was placed on IV heparin for approximately 5 days and then transition to [place] on 10/02/2019 through 10/21. On 10/12/2019 start 20mg  dose daily.  Concern for seizure activity -Neurology consulted and appreciated, previous hospitalist discussed with neurology on time of admission and MRI was recommended along with echo and EEG.  If these were abnormal, would plan for formal neurological consultation.  Hold off on antiepileptics as patient did not have history of seizure. -CT head no acute intercranial abnormality -MRI brain showed motion degraded study without acute findings -EEG: No seizures or left form discharges were seen throughout the recording -No further events while patient has been hospitalized  Demand ischemia -Suspect secondary to pulmonary embolism -Troponin peaked at 139 and trended downward -Echocardiogram without wall motion abnormality  Chronic atrial fibrillation -CHADSVASC 5 -Patient was on Xarelto previously however this was discontinued due to medical noncompliance -As above, patient has been transitioned from heparin to Xarelto  Hyperlipidemia -Continue statin  Chronic kidney disease, stage IIIa -Baseline creatinine approximate 1.4-1.7 -Currently stable  Chronic diastolic heart failure -Euvolemic at this time, BNP was 154 -Metoprolol was held as patient had an episode of nonsustained bradycardia on admission  Asymptomatic pyuria -Urine culture positive for Kocuria Rosea >100K -Previous hospitalist discussed with Dr. Linus Salmons,  infectious disease, given that patient has no symptoms would not treat with antibiotics -Rocephin was discontinued  Diabetes mellitus, type II -  Amaryl held- continue on discharge -Continue Lantus, insulin sliding scale, CBG monitoring  Hypokalemia -resolved  Dementia -Stable, continue Aricept  Code status: DNR  Consultants PCCM  Procedures  Echocardiogram Lower extremity Doppler  Discharge Exam: Vitals:   10/06/19 0529 10/06/19 0530  BP: (!) 135/91 (!) 135/91  Pulse: 83 93  Resp:    Temp: (!) 97.5 F (36.4 C)   SpO2: 95% 95%    General: Well developed, well nourished, elderly, NAD  HEENT: NCAT, mucous membranes moist.  Cardiovascular: S1 S2 auscultated, irregular  Respiratory: Clear to auscultation bilaterally   Abdomen: Soft, nontender, nondistended, + bowel sounds  Extremities: warm dry without cyanosis clubbing or edema  Neuro: AAOx2, nonfocal  Psych: Pleasant, appropriate mood and affect  Discharge Instructions Discharge Instructions    Discharge instructions   Complete by: As directed    Patient will be discharged to skilled nursing facility. Continue physical and occupational therapy.  Patient will need to follow up with primary care provider within one week of discharge.  Patient should continue medications as prescribed.  Patient should follow a heart healthy/carb modified diet.     Allergies as of 10/06/2019      Reactions   Sulfa Antibiotics Swelling   Swelling of hands      Medication List    TAKE these medications   atorvastatin 20 MG tablet Commonly known as: LIPITOR Take 1 tablet (20 mg total) by mouth daily at 6 PM.   donepezil 10 MG tablet Commonly known as: ARICEPT Take 10 mg by mouth at bedtime.   finasteride 5 MG tablet Commonly known as: PROSCAR Take 5 mg by mouth at bedtime.   glimepiride 2 MG tablet Commonly known as: AMARYL Take 2 mg by mouth at bedtime.   insulin aspart 100 UNIT/ML injection Commonly known  as: novoLOG Inject 0-9 Units into the skin 3 (three) times daily with meals. CBG 121 - 150: 1 unit,   CBG 151 - 200: 2 units,   CBG 201 - 250: 3 units,   CBG 251 - 300: 5 units,   CBG 301 - 350: 7 units,   CBG 351 - 400: 9 units   insulin glargine 100 UNIT/ML injection Commonly known as: LANTUS Inject 0.2 mLs (20 Units total) into the skin daily. Start taking on: October 07, 2019   Melatonin 10 MG Tabs Take 10 mg by mouth at bedtime.   metoprolol succinate 50 MG 24 hr tablet Commonly known as: TOPROL-XL Take 1 tablet (50 mg total) by mouth at bedtime. Patient is overdue for an appointment and needs to schedule for further refills 2nd attempt What changed: additional instructions   Rivaroxaban 15 MG Tabs tablet Commonly known as: XARELTO Take 1 tablet (15 mg total) by mouth 2 (two) times daily for 5 days.   rivaroxaban 20 MG Tabs tablet Commonly known as: XARELTO Take 1 tablet (20 mg total) by mouth daily with supper. Start taking on: October 12, 2019      Allergies  Allergen Reactions   Sulfa Antibiotics Swelling    Swelling of hands   Follow-up Information    Tisovec, Fransico Him, MD. Schedule an appointment as soon as possible for a visit in 1 week(s).   Specialty: Internal Medicine Why: Hospital follow up Contact information: [address] [place] [address] [phone]            The results of significant diagnostics from this hospitalization (including imaging, microbiology, ancillary and laboratory) are listed below for reference.  Significant Diagnostic Studies: Ct Head Wo Contrast  Result Date: 09/27/2019 CLINICAL DATA:  Altered level of consciousness EXAM: CT HEAD WITHOUT CONTRAST TECHNIQUE: Contiguous axial images were obtained from the base of the skull through the vertex without intravenous contrast. COMPARISON:  February 25, 2019 FINDINGS: Brain: No evidence of acute territorial infarction, hemorrhage, hydrocephalus,extra-axial collection or  mass lesion/mass effect. There is dilatation the ventricles and sulci consistent with age-related atrophy. Low-attenuation changes in the deep white matter consistent with small vessel ischemia. Vascular: No hyperdense vessel or unexpected calcification. Skull: The skull is intact. No fracture or focal lesion identified. Sinuses/Orbits: The visualized paranasal sinuses and mastoid air cells are clear. The orbits and globes intact. Other: None IMPRESSION: No acute intracranial abnormality. Findings consistent with age related atrophy and chronic small vessel ischemia Electronically Signed   By: Prudencio Pair M.D.   On: 09/27/2019 22:28   Ct Angio Chest Pe W And/or Wo Contrast  Result Date: 09/27/2019 CLINICAL DATA:  Sudden onset upper extremity tremors and fixed left-sided gaze for 10 seconds. EXAM: CT ANGIOGRAPHY CHEST WITH CONTRAST TECHNIQUE: Multidetector CT imaging of the chest was performed using the standard protocol during bolus administration of intravenous contrast. Multiplanar CT image reconstructions and MIPs were obtained to evaluate the vascular anatomy. CONTRAST:  73mL OMNIPAQUE IOHEXOL 350 MG/ML SOLN COMPARISON:  Chest x-ray from same day. CT chest dated February 28, 2016. FINDINGS: Cardiovascular: Satisfactory opacification of the pulmonary arteries to the segmental level. Positive for acute pulmonary emboli with saddle pulmonary embolus and bilateral lobar and segmental pulmonary emboli involving all lobes. Elevated RV/LV ratio of 2.7 normal heart size. No pericardial effusion. No thoracic aortic aneurysm. Coronary, aortic arch, and branch vessel atherosclerotic vascular disease. Mediastinum/Nodes: No enlarged mediastinal, hilar, or axillary lymph nodes. Thyroid gland, trachea, and esophagus demonstrate no significant findings. Lungs/Pleura: Scattered parenchymal scarring corresponding to sites of prior infection/inflammation seen on prior chest CT from 2017. Mild mosaic perfusion. No focal  consolidation, pleural effusion, or pneumothorax. Left lower lobe peribronchial thickening. No suspicious pulmonary nodule. Upper Abdomen: No acute abnormality. Musculoskeletal: No chest wall abnormality. No acute or significant osseous findings. T2 and T8 vertebral body hemangiomas. Review of the MIP images confirms the above findings. IMPRESSION: 1. Positive for acute PE with CT evidence of right heart strain (RV/LV Ratio = 2.7) consistent with at least submassive (intermediate risk) PE. The presence of right heart strain has been associated with an increased risk of morbidity and mortality. Please activate Code PE by paging [phone]. 2.  Aortic atherosclerosis (ICD10-I70.0). Critical Value/emergent results were called by telephone at the time of interpretation on 09/27/2019 at 11:26 pm to providerDAVID YAO , who verbally acknowledged these results. Electronically Signed   By: Titus Dubin M.D.   On: 09/27/2019 23:28   [name] Contrast  Result Date: 09/28/2019 CLINICAL DATA:  TIA.  Left-sided weakness EXAM: MRI HEAD WITHOUT CONTRAST TECHNIQUE: Multiplanar, multiecho pulse sequences of the brain and surrounding structures were obtained without intravenous contrast. COMPARISON:  Head CT from yesterday.  Brain MRI 01/02/2017 FINDINGS: Brain: No acute infarction, hemorrhage, hydrocephalus, extra-axial collection or mass lesion. Severe brain atrophy, especially notable in the frontal and temporal lobes. Confluent ischemic gliosis in the deep cerebral white matter with remote cortically based infarcts along the high right frontal convexity. Vascular: Major flow voids are preserved Skull and upper cervical spine: Negative for marrow lesion Sinuses/Orbits: No acute finding Other: Significant motion degradation. IMPRESSION: 1. Motion degraded study without acute finding. 2. Advanced frontotemporal atrophy.  3. Extensive chronic small vessel ischemia. Electronically Signed   By: Monte Fantasia M.D.   On:  09/28/2019 06:07   Dg Chest Port 1 View  Result Date: 09/27/2019 CLINICAL DATA:  Cough EXAM: PORTABLE CHEST 1 VIEW COMPARISON:  February 25, 2019 FINDINGS: Unchanged cardiomediastinal silhouette. Aortic knob calcifications. Both lungs are clear. The visualized skeletal structures are unremarkable. IMPRESSION: No acute cardiopulmonary process. Electronically Signed   By: Prudencio Pair M.D.   On: 09/27/2019 22:13   Vas [place] Lower Extremity Venous (dvt)  Result Date: 09/28/2019  Lower Venous Study Indications: Pulmonary embolism.  Performing Technologist: Abram Sander RVS  Examination Guidelines: A complete evaluation includes B-mode imaging, spectral Doppler, color Doppler, and power Doppler as needed of all accessible portions of each vessel. Bilateral testing is considered an integral part of a complete examination. Limited examinations for reoccurring indications may be performed as noted.  +---------+---------------+---------+-----------+----------+--------------+  RIGHT     Compressibility Phasicity Spontaneity Properties Thrombus Aging  +---------+---------------+---------+-----------+----------+--------------+  CFV       Full            Yes       Yes                                    +---------+---------------+---------+-----------+----------+--------------+  SFJ       Full                                                             +---------+---------------+---------+-----------+----------+--------------+  FV Prox   Full                                                             +---------+---------------+---------+-----------+----------+--------------+  FV Mid    Full                                                             +---------+---------------+---------+-----------+----------+--------------+  FV Distal Full                                                             +---------+---------------+---------+-----------+----------+--------------+  PFV       Full                                                              +---------+---------------+---------+-----------+----------+--------------+  POP       Full            Yes  Yes                                    +---------+---------------+---------+-----------+----------+--------------+  PTV       Full                                                             +---------+---------------+---------+-----------+----------+--------------+  PERO                                                       Not visualized  +---------+---------------+---------+-----------+----------+--------------+   +---------+---------------+---------+-----------+----------+--------------+  LEFT      Compressibility Phasicity Spontaneity Properties Thrombus Aging  +---------+---------------+---------+-----------+----------+--------------+  CFV       Full            Yes       Yes                                    +---------+---------------+---------+-----------+----------+--------------+  SFJ       Full                                                             +---------+---------------+---------+-----------+----------+--------------+  FV Prox   Full                                                             +---------+---------------+---------+-----------+----------+--------------+  FV Mid    Full                                                             +---------+---------------+---------+-----------+----------+--------------+  FV Distal Full                                                             +---------+---------------+---------+-----------+----------+--------------+  PFV       Full                                                             +---------+---------------+---------+-----------+----------+--------------+  POP       Full  Yes       Yes                                    +---------+---------------+---------+-----------+----------+--------------+  PTV       Full                                                              +---------+---------------+---------+-----------+----------+--------------+  PERO                                                       Not visualized  +---------+---------------+---------+-----------+----------+--------------+     Summary: Right: There is no evidence of deep vein thrombosis in the lower extremity. No cystic structure found in the popliteal fossa. Left: There is no evidence of deep vein thrombosis in the lower extremity. No cystic structure found in the popliteal fossa.  *See table(s) above for measurements and observations. Electronically signed by Monica Martinez MD on 09/28/2019 at 5:15:09 PM.    Final     Microbiology: Recent Results (from the past 240 hour(s))  SARS Coronavirus 2 [facility] order, Performed in [facility] hospital lab) Nasopharyngeal Nasopharyngeal Swab     Status: None   Collection Time: 09/27/19 10:05 PM   Specimen: Nasopharyngeal Swab  Result Value Ref Range Status   SARS Coronavirus 2 NEGATIVE NEGATIVE Final    Comment: (NOTE) If result is NEGATIVE SARS-CoV-2 target nucleic acids are NOT DETECTED. The SARS-CoV-2 RNA is generally detectable in upper and lower  respiratory specimens during the acute phase of infection. The lowest  concentration of SARS-CoV-2 viral copies this assay can detect is 250  copies / mL. A negative result does not preclude SARS-CoV-2 infection  and should not be used as the sole basis for treatment or other  patient management decisions.  A negative result may occur with  improper specimen collection / handling, submission of specimen other  than nasopharyngeal swab, presence of viral mutation(s) within the  areas targeted by this assay, and inadequate number of viral copies  (<250 copies / mL). A negative result must be combined with clinical  observations, patient history, and epidemiological information. If result is POSITIVE SARS-CoV-2 target nucleic acids are DETECTED. The SARS-CoV-2 RNA is generally detectable in upper  and lower  respiratory specimens dur ing the acute phase of infection.  Positive  results are indicative of active infection with SARS-CoV-2.  Clinical  correlation with patient history and other diagnostic information is  necessary to determine patient infection status.  Positive results do  not rule out bacterial infection or co-infection with other viruses. If result is PRESUMPTIVE POSTIVE SARS-CoV-2 nucleic acids MAY BE PRESENT.   A presumptive positive result was obtained on the submitted specimen  and confirmed on repeat testing.  While 2019 novel coronavirus  (SARS-CoV-2) nucleic acids may be present in the submitted sample  additional confirmatory testing may be necessary for epidemiological  and / or clinical management purposes  to differentiate between  SARS-CoV-2 and other Sarbecovirus currently known to infect humans.  If clinically indicated additional testing with  an alternate test  methodology [id number]) is advised. The SARS-CoV-2 RNA is generally  detectable in upper and lower respiratory sp ecimens during the acute  phase of infection. The expected result is Negative. Fact Sheet for Patients:  StrictlyIdeas.no Fact Sheet for Healthcare Providers: BankingDealers.co.za This test is not yet approved or cleared by the [place] FDA and has been authorized for detection and/or diagnosis of SARS-CoV-2 by FDA under an Emergency Use Authorization (EUA).  This EUA will remain in effect (meaning this test can be used) for the duration of the COVID-19 declaration under Section 564(b)(1) of the Act, 21 U.S.C. section 360bbb-3(b)(1), unless the authorization is terminated or revoked sooner. Performed at [facility], [place] [address], [place], [place] [address]   Culture, Urine     Status: Abnormal   Collection Time: 09/28/19  2:05 AM   Specimen: Urine, Random  Result Value Ref Range Status   Specimen Description URINE,  RANDOM  Final   Special Requests NONE  Final   Culture (A)  Final    >=100,000 COLONIES/mL KOCURIA ROSEA Standardized susceptibility testing for this organism is not available. Performed at [facility], [place] [address] [place], [place] [address]    Report Status 09/30/2019 FINAL  Final  Novel Coronavirus, NAA (Hosp order, Send-out to Ref Lab; TAT 18-24 hrs     Status: None   Collection Time: 10/03/19 11:23 AM   Specimen: Nasopharyngeal Swab; Respiratory  Result Value Ref Range Status   SARS-CoV-2, NAA NOT DETECTED NOT DETECTED Final    Comment: (NOTE) This nucleic acid amplification test was developed and its performance characteristics determined by [employer]. Nucleic acid amplification tests include PCR and TMA. This test has not been FDA cleared or approved. This test has been authorized by FDA under an Emergency Use Authorization (EUA). This test is only authorized for the duration of time the declaration that circumstances exist justifying the authorization of the emergency use of in vitro diagnostic tests for detection of SARS-CoV-2 virus and/or diagnosis of COVID-19 infection under section 564(b)(1) of the Act, 21 U.S.C. [id number]) (1), unless the authorization is terminated or revoked sooner. When diagnostic testing is negative, the possibility of a false negative result should be considered in the context of a patient's recent exposures and the presence of clinical signs and symptoms consistent with COVID-19. An individual without symptoms of COVID- 19 and who is not shedding SARS-CoV-2 vi rus would expect to have a negative (not detected) result in this assay. Performed At: [facility] [address] [place], [place] [id number] Rush Farmer MD [id number]    [place]  Final    Comment: Performed at [facility], [place] [address] [place], [place] [address]     Labs: Basic Metabolic Panel: Recent Labs    Lab 10/01/19 [id number] 10/02/19 1006 10/02/19 1038 10/03/19 0310 10/04/19 0500  NA 139 136 135 139 140  K 4.1 3.2* 3.3* 3.4* 3.9  CL 105 102 103 104 106  CO2 23 23 22 24 23   GLUCOSE 147* 207* 224* 154* 130*  BUN 13 12 13 13 13   CREATININE 1.46* 1.32* 1.30* 1.44* 1.18  CALCIUM 9.3 8.7* 8.7* 9.1 9.3  MG  --   --   --   --  1.8   Liver Function Tests: No results for input(s): AST, ALT, ALKPHOS, BILITOT, PROT, ALBUMIN in the last 168 hours. No results for input(s): LIPASE, AMYLASE in the last 168 hours. No results for input(s):  AMMONIA in the last 168 hours. CBC: Recent Labs  Lab 09/30/19 0322 10/01/19 0733 10/02/19 1006 10/02/19 1038 10/04/19 0500  WBC 8.7 8.4 7.2 6.9 8.0  HGB 12.6* 13.1 12.5* 12.0* 12.7*  HCT 36.4* 38.0* 36.5* 35.5* 37.1*  MCV 95.0 94.3 93.4 94.4 94.2  PLT 141* 152 150 149* 167   Cardiac Enzymes: No results for input(s): CKTOTAL, CKMB, CKMBINDEX, TROPONINI in the last 168 hours. BNP: BNP (last 3 results) Recent Labs    02/25/19 0612 09/27/19 2212  BNP 153.5* 154.0*    ProBNP (last 3 results) No results for input(s): PROBNP in the last 8760 hours.  CBG: Recent Labs  Lab 10/05/19 1123 10/05/19 1638 10/05/19 2045 10/06/19 0612 10/06/19 1109  GLUCAP 278* 195* 129* 140* 165*       Signed:  Arkin Imran  [facility] 10/06/2019, 1:44 PM

## 2019-10-06 NOTE — Progress Notes (Signed)
Physical Therapy Treatment Patient Details Name: [name] [name] MRN: [id number] DOB: [birth year] Today's Date: 10/06/2019    History of Present Illness [name] [name] is a 85 year old male with past medical history significant for chronic atrial fibrillation not on anticoagulation, type 2 diabetes, chronic kidney disease stage III, history of CVA with chronic left-sided weakness, chronic diastolic heart failure, COPD, hypertension, dementia, OSA not on CPAP who presents with sudden tremor of upper extremity, fixed left gaze for around 10 seconds.  Upon EMS arrival, they did not note any neuro deficits.  Patient does have history of CVA with chronic left-sided weakness.  Patient had some altered mental status for about 3 to 4 minutes after the event, no history of seizures in the past.  In the emergency department, CTA chest revealed bilateral saddle PE.    PT Comments    Pt requiring more assist this session from previous. Pt with c/o dizziness that worsens when rolling to R and upon sitting up. When attempting to stand pt with posterior lean and feet sliding along the floor. He may do well with a steady to prevent feet from sliding. HR reached 150 during activity requiring rest break. Pt is very positive and willing to participate but currently limited by dizziness and tachycardia. Will continue to follow for mobility progression.    Follow Up Recommendations  SNF;Supervision/Assistance - 24 hour     Equipment Recommendations  None recommended by PT    Recommendations for Other Services       Precautions / Restrictions Precautions Precautions: Fall Precaution Comments: Dizziness Restrictions Weight Bearing Restrictions: No    Mobility  Bed Mobility Overal bed mobility: Needs Assistance Bed Mobility: Supine to Sit;Rolling Rolling: Mod assist;Max assist   Supine to sit: Mod assist;HOB elevated;Max assist     General bed mobility comments: Pt able to roll to the L with mod A,  however required max A to the R and unable to roll fully to the R secondary to dizziness.  Required max A to elevate trunk and progress hips forwards to EOB. Cues for sequencing and technique.   Transfers Overall transfer level: Needs assistance Equipment used: Rolling walker (2 wheeled) Transfers: Sit to/from Stand Sit to Stand: +2 physical assistance;From elevated surface;Max assist         General transfer comment: Assist +2 to power up into standing with pt pulling on RW. Heavy posterior lean, almost pushing, causing BIL LEs to slid along the floor. Pt stood from EOB 3x, fatigueing after each attempt. HR reached 150 during activity requiring rest break.   Ambulation/Gait             General Gait Details: unable   Stairs             Wheelchair Mobility    Modified Rankin (Stroke Patients Only)       Balance Overall balance assessment: Needs assistance Sitting-balance support: Feet supported;No upper extremity supported Sitting balance-Leahy Scale: Poor Sitting balance - Comments: Initially requires Mod A due to posterior LOB secondary to dizziness; progressing to Min guard. With looking up, pt with LOB backwards. Postural control: Posterior lean Standing balance support: During functional activity Standing balance-Leahy Scale: Zero Standing balance comment: Requires BUe support and external assist.                             Cognition Arousal/Alertness: Awake/alert Behavior During Therapy: WFL for tasks assessed/performed Overall Cognitive Status: Within Functional Limits for  tasks assessed                                 General Comments: Positive and joking with therapy. Willing to participate.      Exercises      General Comments        Pertinent Vitals/Pain Pain Assessment: No/denies pain    Home Living                      Prior Function            PT Goals (current goals can now be found in the care  plan section) Acute Rehab PT Goals Patient Stated Goal: none stated PT Goal Formulation: With patient Time For Goal Achievement: 10/13/19 Potential to Achieve Goals: Fair Progress towards PT goals: Not progressing toward goals - comment(dizziness limiting session)    Frequency    Min 2X/week      PT Plan Current plan remains appropriate;Frequency needs to be updated    Co-evaluation PT/OT/SLP Co-Evaluation/Treatment: Yes Reason for Co-Treatment: Necessary to address cognition/behavior during functional activity;For patient/therapist safety;To address functional/ADL transfers PT goals addressed during session: Mobility/safety with mobility        AM-PAC PT "6 Clicks" Mobility   Outcome Measure  Help needed turning from your back to your side while in a flat bed without using bedrails?: A Lot Help needed moving from lying on your back to sitting on the side of a flat bed without using bedrails?: A Lot Help needed moving to and from a bed to a chair (including a wheelchair)?: Total Help needed standing up from a chair using your arms (e.g., wheelchair or bedside chair)?: Total Help needed to walk in hospital room?: Total Help needed climbing 3-5 steps with a railing? : Total 6 Click Score: 8    End of Session Equipment Utilized During Treatment: Gait belt Activity Tolerance: Other (comment)(limited by dizziness and tachycardia) Patient left: with call bell/phone within reach;in bed;with bed alarm set Nurse Communication: Mobility status PT Visit Diagnosis: Unsteadiness on feet (R26.81);Muscle weakness (generalized) (M62.81);Difficulty in walking, not elsewhere classified (R26.2)     Time: [id number] PT Time Calculation (min) (ACUTE ONLY): 23 min  Charges:  $Therapeutic Activity: 8-22 mins                     Benjiman Core, [place] Pager [phone] [place] 10/06/2019, 11:20 AM

## 2019-10-06 NOTE — TOC Transition Note (Signed)
Transition of Care [facility]) - CM/SW Discharge Note   Patient Details  Name: [name] [name] MRN: [id number] Date of Birth: November 17, 1926  [facility]) CM/SW Contact:  Vinie Sill, [place] Phone Number: 10/06/2019, 2:43 PM   Clinical Narrative:     Patient will DC to: [place] Date: 10/06/2019 Family Notified: Vickie, daughter Transport By: Corey Harold  RN, patient, and facility notified of DC. Discharge Summary sent to facility. RN given number for [phone], Room 801. Ambulance transport requested for patient.   Clinical Social Worker signing off. Thurmond Butts, MSW, [facility] Clinical Social Worker [phone]     Final next level of care: Skilled Nursing Facility Barriers to Discharge: Barriers Resolved   Patient Goals and CMS Choice   CMS Medicare.gov Compare Post Acute Care list provided to:: Patient Represenative (must comment)(patient's daughter) Choice offered to / list presented to : Adult Children  Discharge Placement PASRR number recieved: 09/30/19            Patient chooses bed at: [facility] Patient to be transferred to facility by: [place] Name of family member notified: Loletha Carrow, daughter Patient and family notified of of transfer: 10/06/19  Discharge Plan and Services                                     Social Determinants of Health (SDOH) Interventions     Readmission Risk Interventions No flowsheet data found.

## 2019-10-06 NOTE — Progress Notes (Signed)
Occupational Therapy Treatment Patient Details Name: [name] [name] MRN: [id number] DOB: 19-May-1926 Today's Date: 10/06/2019    History of present illness [name] [name] is a 85 year old male with past medical history significant for chronic atrial fibrillation not on anticoagulation, type 2 diabetes, chronic kidney disease stage III, history of CVA with chronic left-sided weakness, chronic diastolic heart failure, COPD, hypertension, dementia, OSA not on CPAP who presents with sudden tremor of upper extremity, fixed left gaze for around 10 seconds.  Upon EMS arrival, they did not note any neuro deficits.  Patient does have history of CVA with chronic left-sided weakness.  Patient had some altered mental status for about 3 to 4 minutes after the event, no history of seizures in the past.  In the emergency department, CTA chest revealed bilateral saddle PE.   OT comments  Pt progressing towards acute OT goals. Pt motivated to work with therapy. Limited by dizziness once EOB (towards R side) and fatigue. Focus was functional transfers. Pt needing +2 max A to stand. May do better with sara stedy utilized to help block BLE as pt has posterior lean. D/c plan remains appropriate.    Follow Up Recommendations  SNF    Equipment Recommendations  Other (comment)(TBD next venue)    Recommendations for Other Services      Precautions / Restrictions Precautions Precautions: Fall Precaution Comments: Dizziness Restrictions Weight Bearing Restrictions: No       Mobility Bed Mobility Overal bed mobility: Needs Assistance Bed Mobility: Supine to Sit;Rolling Rolling: Mod assist;Max assist   Supine to sit: Mod assist;HOB elevated;Max assist     General bed mobility comments: Pt able to roll to the L with mod A, however required max A to the R and unable to roll fully to the R secondary to dizziness.  Required max A to elevate trunk and progress hips forwards to EOB. Cues for sequencing and  technique.   Transfers Overall transfer level: Needs assistance Equipment used: Rolling walker (2 wheeled) Transfers: Sit to/from Stand Sit to Stand: +2 physical assistance;From elevated surface;Max assist         General transfer comment: Assist +2 to power up into standing with pt pulling on RW. Heavy posterior lean, almost pushing, causing BIL LEs to slid along the floor. Pt stood from EOB 3x, fatigueing after each attempt. HR reached 150 during activity requiring rest break.     Balance Overall balance assessment: Needs assistance Sitting-balance support: Feet supported;No upper extremity supported Sitting balance-Leahy Scale: Poor Sitting balance - Comments: Initially requires Mod A due to posterior LOB secondary to dizziness; progressing to Min guard. With looking up, pt with LOB backwards. Postural control: Posterior lean Standing balance support: During functional activity Standing balance-Leahy Scale: Zero Standing balance comment: Requires BUe support and external assist.                            ADL either performed or assessed with clinical judgement   ADL Overall ADL's : Needs assistance/impaired                                       General ADL Comments: Focused on sit<>stand transfers this session toward LB ADL and functional transfer goals     Vision       Perception     Praxis      Cognition Arousal/Alertness: Awake/alert  Behavior During Therapy: WFL for tasks assessed/performed Overall Cognitive Status: Within Functional Limits for tasks assessed                                 General Comments: Positive and joking with therapy. Willing to participate.        Exercises     Shoulder Instructions       General Comments      Pertinent Vitals/ Pain       Pain Assessment: No/denies pain  Home Living                                          Prior Functioning/Environment               Frequency  Min 2X/week        Progress Toward Goals  OT Goals(current goals can now be found in the care plan section)  Progress towards OT goals: Progressing toward goals  Acute Rehab OT Goals Patient Stated Goal: none stated Time For Goal Achievement: 10/13/19 Potential to Achieve Goals: [place] Discharge plan remains appropriate    Co-evaluation    PT/OT/SLP Co-Evaluation/Treatment: Yes Reason for Co-Treatment: Necessary to address cognition/behavior during functional activity;For patient/therapist safety;To address functional/ADL transfers PT goals addressed during session: Mobility/safety with mobility OT goals addressed during session: ADL's and self-care      AM-PAC OT "6 Clicks" Daily Activity     Outcome Measure   Help from another person eating meals?: A Little Help from another person taking care of personal grooming?: A Little Help from another person toileting, which includes using toliet, bedpan, or urinal?: Total Help from another person bathing (including washing, rinsing, drying)?: A Lot Help from another person to put on and taking off regular upper body clothing?: A Lot Help from another person to put on and taking off regular lower body clothing?: Total 6 Click Score: 12    End of Session Equipment Utilized During Treatment: Gait belt;Rolling walker  OT Visit Diagnosis: Unsteadiness on feet (R26.81)   Activity Tolerance Patient tolerated treatment well;Other (comment)(+ dizziness)   Patient Left in bed;with call bell/phone within reach;with bed alarm set   Nurse Communication Mobility status;Other (comment)(BPPV consult)        Time: [id number] OT Time Calculation (min): 23 min  Charges: OT General Charges $OT Visit: 1 Visit OT Treatments $Self Care/Home Management : 8-22 mins  Tyrone Schimke, OT Acute Rehabilitation Services Pager: [phone] Office: [phone]    Hortencia Pilar 10/06/2019, 1:11 PM

## 2019-10-09 DIAGNOSIS — I4891 Unspecified atrial fibrillation: Secondary | ICD-10-CM | POA: Diagnosis not present

## 2019-10-09 DIAGNOSIS — J449 Chronic obstructive pulmonary disease, unspecified: Secondary | ICD-10-CM | POA: Diagnosis not present

## 2019-10-09 DIAGNOSIS — I5032 Chronic diastolic (congestive) heart failure: Secondary | ICD-10-CM | POA: Diagnosis not present

## 2019-10-09 DIAGNOSIS — I2602 Saddle embolus of pulmonary artery with acute cor pulmonale: Secondary | ICD-10-CM | POA: Diagnosis not present

## 2019-10-13 DIAGNOSIS — E119 Type 2 diabetes mellitus without complications: Secondary | ICD-10-CM | POA: Diagnosis not present

## 2019-10-13 DIAGNOSIS — J449 Chronic obstructive pulmonary disease, unspecified: Secondary | ICD-10-CM | POA: Diagnosis not present

## 2019-10-13 DIAGNOSIS — D72829 Elevated white blood cell count, unspecified: Secondary | ICD-10-CM | POA: Diagnosis not present

## 2019-10-13 DIAGNOSIS — I2602 Saddle embolus of pulmonary artery with acute cor pulmonale: Secondary | ICD-10-CM | POA: Diagnosis not present

## 2019-10-20 DIAGNOSIS — E119 Type 2 diabetes mellitus without complications: Secondary | ICD-10-CM | POA: Diagnosis not present

## 2019-10-20 DIAGNOSIS — L97829 Non-pressure chronic ulcer of other part of left lower leg with unspecified severity: Secondary | ICD-10-CM | POA: Diagnosis not present

## 2019-10-20 DIAGNOSIS — I5032 Chronic diastolic (congestive) heart failure: Secondary | ICD-10-CM | POA: Diagnosis not present

## 2019-10-20 DIAGNOSIS — J449 Chronic obstructive pulmonary disease, unspecified: Secondary | ICD-10-CM | POA: Diagnosis not present

## 2019-10-24 DIAGNOSIS — F329 Major depressive disorder, single episode, unspecified: Secondary | ICD-10-CM | POA: Diagnosis not present

## 2019-10-24 DIAGNOSIS — F0390 Unspecified dementia without behavioral disturbance: Secondary | ICD-10-CM | POA: Diagnosis not present

## 2019-10-25 DIAGNOSIS — I739 Peripheral vascular disease, unspecified: Secondary | ICD-10-CM | POA: Diagnosis not present

## 2019-10-25 DIAGNOSIS — E119 Type 2 diabetes mellitus without complications: Secondary | ICD-10-CM | POA: Diagnosis not present

## 2019-10-25 DIAGNOSIS — I2602 Saddle embolus of pulmonary artery with acute cor pulmonale: Secondary | ICD-10-CM | POA: Diagnosis not present

## 2019-10-25 DIAGNOSIS — N179 Acute kidney failure, unspecified: Secondary | ICD-10-CM | POA: Diagnosis not present

## 2019-10-27 DIAGNOSIS — I5032 Chronic diastolic (congestive) heart failure: Secondary | ICD-10-CM | POA: Diagnosis not present

## 2019-10-27 DIAGNOSIS — I130 Hypertensive heart and chronic kidney disease with heart failure and stage 1 through stage 4 chronic kidney disease, or unspecified chronic kidney disease: Secondary | ICD-10-CM | POA: Diagnosis not present

## 2019-10-27 DIAGNOSIS — L89620 Pressure ulcer of left heel, unstageable: Secondary | ICD-10-CM | POA: Diagnosis not present

## 2019-10-27 DIAGNOSIS — L89610 Pressure ulcer of right heel, unstageable: Secondary | ICD-10-CM | POA: Diagnosis not present

## 2019-11-07 DIAGNOSIS — I2692 Saddle embolus of pulmonary artery without acute cor pulmonale: Secondary | ICD-10-CM | POA: Diagnosis not present

## 2019-11-07 DIAGNOSIS — I248 Other forms of acute ischemic heart disease: Secondary | ICD-10-CM | POA: Diagnosis not present

## 2019-11-07 DIAGNOSIS — I480 Paroxysmal atrial fibrillation: Secondary | ICD-10-CM | POA: Diagnosis not present

## 2019-11-07 DIAGNOSIS — R4182 Altered mental status, unspecified: Secondary | ICD-10-CM | POA: Diagnosis not present

## 2019-11-09 ENCOUNTER — Telehealth: Payer: Self-pay

## 2019-11-09 NOTE — Telephone Encounter (Signed)
NOTES ON FILE FROM  KATIE HYATT NP [phone], SENT REFERRAL TO SCHEDULING

## 2019-11-24 ENCOUNTER — Encounter (INDEPENDENT_AMBULATORY_CARE_PROVIDER_SITE_OTHER): Payer: Self-pay

## 2019-11-24 ENCOUNTER — Ambulatory Visit (INDEPENDENT_AMBULATORY_CARE_PROVIDER_SITE_OTHER): Payer: Medicare Other | Admitting: Cardiology

## 2019-11-24 ENCOUNTER — Other Ambulatory Visit: Payer: Self-pay

## 2019-11-24 ENCOUNTER — Encounter: Payer: Self-pay | Admitting: Cardiology

## 2019-11-24 VITALS — BP 110/70 | HR 100 | Ht 68.0 in | Wt 223.0 lb

## 2019-11-24 DIAGNOSIS — I4821 Permanent atrial fibrillation: Secondary | ICD-10-CM

## 2019-11-24 DIAGNOSIS — I2699 Other pulmonary embolism without acute cor pulmonale: Secondary | ICD-10-CM | POA: Diagnosis not present

## 2019-11-24 DIAGNOSIS — E119 Type 2 diabetes mellitus without complications: Secondary | ICD-10-CM | POA: Diagnosis not present

## 2019-11-24 DIAGNOSIS — I10 Essential (primary) hypertension: Secondary | ICD-10-CM | POA: Diagnosis not present

## 2019-11-24 NOTE — Patient Instructions (Signed)
Medication Instructions:   Your physician recommends that you continue on your current medications as directed. Please refer to the Current Medication list given to you today.  *If you need a refill on your cardiac medications before your next appointment, please call your pharmacy*    Follow-Up:   Your physician wants you to follow-up in: [place] [facility] [place] will receive a reminder letter in the mail two months in advance. If you don't receive a letter, please call our office to schedule the follow-up appointment.   At [facility], you and your health needs are our priority.  As part of our continuing mission to provide you with exceptional heart care, we have created designated Provider Care Teams.  These Care Teams include your primary Cardiologist (physician) and Advanced Practice Providers (APPs -  Physician Assistants and Nurse Practitioners) who all work together to provide you with the care you need, when you need it.  Your next appointment:   12 month(s)  The format for your next appointment:   In Person  Provider:   Candee Furbish, MD

## 2019-11-24 NOTE — Progress Notes (Signed)
Cardiology Office Note:    Date:  11/24/2019   ID:  [name], DOB 07-Jun-1926, MRN [id number]  PCP:  [name] Pao, MD  Cardiologist:  [name] Furbish, MD  Electrophysiologist:  None   Referring MD: [name] Echevaria*     History of Present Illness:    [name] [name] is a 85 y.o. male with chronic atrial fibrillation here for follow-up.  Has diastolic heart failure COPD diabetes obesity currently in a wheelchair.  Recent hospitalizations/previous hospitalizations with pneumonia.  Had a hospitalization in October with acute saddle pulmonary embolism treated with IV heparin then Xarelto.  Overall he is feeling well, has a good sense of humor, no chest pain no shortness of breath no bleeding in a wheelchair.    Past Medical History:  Diagnosis Date  . Acute hypoxemic respiratory failure ([place]) 04/09/2016  . Acute renal failure superimposed on stage 3 chronic kidney disease ([place]) 04/21/2015  . Arthritis   . ARTHRITIS 11/27/2008   Qualifier: Diagnosis of  By: Cleda Mccreedy, Davis Gourd   . Atrial fibrillation, chronic 11/27/2008   Qualifier: Diagnosis of  By: Cleda Mccreedy, Davis Gourd  Qualifier: Diagnosis of  By: Shelda Pal    . BPH (benign prostatic hyperplasia) 04/21/2015  . BPH/LUTS W/O OBSTRUCTION 11/27/2008   Qualifier: Diagnosis of  By: Cleda Mccreedy, Davis Gourd   . Bronchitis 04/21/2015  . Cerebral embolism with cerebral infarction 01/04/2017  . Chronic atrial fibrillation ([place])   . Chronic diastolic CHF (congestive heart failure) ([place]) 02/25/2019  . CKD (chronic kidney disease), stage III 02/25/2019  . COPD (chronic obstructive pulmonary disease) with chronic bronchitis ([place]) 03/12/2016  . Dehydration 10/02/2017  . Dementia without behavioral disturbance ([place])   . Diabetes mellitus without complication ([place])   . Dyspnea 12/28/2012  . Elevated lactic acid level 01/28/2016  . Elevated troponin 02/26/2016  . Frequent falls 01/01/2017  . Gastroenteritis 10/06/2017   . Generalized weakness 01/01/2017  . GERD 11/27/2008   Qualifier: Diagnosis of  By: Cleda Mccreedy, Davis Gourd   . HCAP (healthcare-associated pneumonia) 02/26/2016  . Hyperglycemia 04/21/2015  . Hyperlipidemia   . Hypertension   . HYPERTENSION, BENIGN 11/27/2008   Qualifier: Diagnosis of  By: Cleda Mccreedy, Davis Gourd   . Hypokalemia 10/02/2017  . OTHER DYSPNEA AND RESPIRATORY ABNORMALITIES 05/27/2010   Qualifier: Diagnosis of  By: Owens Shark, RN, BSN, Lauren    . Pulmonary embolism ([place]) 09/28/2019  . Sepsis ([place]) 01/28/2016  . Sleep apnea    no cpap used, sleep study was several yrs ago  . Syncope 10/02/2017  . Tachycardia 04/21/2015  . Urinary retention foley last changed 3 weeks ago    Past Surgical History:  Procedure Laterality Date  . BACK SURGERY  yrs ago   lower back surgery  . PROSTATE SURGERY  yrs ago, no cancer  . TONSILLECTOMY  as child  . TRANSURETHRAL RESECTION OF PROSTATE  02/22/2012   Procedure: TRANSURETHRAL RESECTION OF THE PROSTATE WITH GYRUS INSTRUMENTS;  Surgeon: [name] Hazard, MD;  Location: [facility];  Service: Urology;  Laterality: N/A;        Current Medications: Current Meds  Medication Sig  . atorvastatin (LIPITOR) 20 MG tablet Take 1 tablet (20 mg total) by mouth daily at 6 PM.  . donepezil (ARICEPT) 10 MG tablet Take 10 mg by mouth at bedtime.   . finasteride (PROSCAR) 5 MG tablet Take 5 mg by mouth at bedtime.   Marland Kitchen glimepiride (AMARYL) 2 MG tablet Take 2 mg  by mouth at bedtime.   . insulin aspart (NOVOLOG) 100 UNIT/ML injection Inject 0-9 Units into the skin 3 (three) times daily with meals. CBG 121 - 150: 1 unit,   CBG 151 - 200: 2 units,   CBG 201 - 250: 3 units,   CBG 251 - 300: 5 units,   CBG 301 - 350: 7 units,   CBG 351 - 400: 9 units  . insulin glargine (LANTUS) 100 UNIT/ML injection Inject 0.2 mLs (20 Units total) into the skin daily.  . Melatonin 10 MG TABS Take 10 mg by mouth at bedtime.  . metoprolol succinate (TOPROL-XL) 50 MG 24 hr  tablet Take 1 tablet (50 mg total) by mouth at bedtime. Patient is overdue for an appointment and needs to schedule for further refills 2nd attempt  . Rivaroxaban (XARELTO) 15 MG TABS tablet Take 1 tablet (15 mg total) by mouth 2 (two) times daily for 5 days.  . rivaroxaban (XARELTO) 20 MG TABS tablet Take 1 tablet (20 mg total) by mouth daily with supper.     Allergies:   Sulfa antibiotics   Social History   Socioeconomic History  . Marital status: Married    Spouse name: Not on file  . Number of children: Not on file  . Years of education: Not on file  . Highest education level: Not on file  Occupational History  . Not on file  Social Needs  . Financial resource strain: Not on file  . Food insecurity    Worry: Not on file    Inability: Not on file  . Transportation needs    Medical: Not on file    Non-medical: Not on file  Tobacco Use  . Smoking status: Former Smoker    Packs/day: 0.50    Years: 50.00    Pack years: 25.00    Types: Cigarettes    Quit date: 12/21/1980    Years since quitting: 38.9  . Smokeless tobacco: Never Used  Substance and Sexual Activity  . Alcohol use: Yes    Alcohol/week: 0.0 standard drinks    Comment: rare use  . Drug use: No  . Sexual activity: Never  Lifestyle  . Physical activity    Days per week: Not on file    Minutes per session: Not on file  . Stress: Not on file  Relationships  . Social Herbalist on phone: Not on file    Gets together: Not on file    Attends religious service: Not on file    Active member of club or organization: Not on file    Attends meetings of clubs or organizations: Not on file    Relationship status: Not on file  Other Topics Concern  . Not on file  Social History Narrative  . Not on file     Family History: The patient's family history includes Heart attack in his father.  ROS:   Please see the history of present illness.     All other systems reviewed and are negative.   EKGs/Labs/Other Studies Reviewed:    The following studies were reviewed today: ECHO 09/2019  1. Left ventricular ejection fraction, by visual estimation, is 60 to 65%. The left ventricle has normal function. Normal left ventricular size. Left ventricular septal wall thickness was moderately increased. Mildly increased left ventricular posterior  wall thickness. There is no left ventricular hypertrophy. The left ventricular diastology could not be evaluatedsecondary to atrial fibrillation.  2. Global right ventricle has moderately  reduced systolic function.The right ventricular size is normal. No increase in right ventricular wall thickness.  3. Left atrial size was normal.  4. Right atrial size was normal.  5. Moderate aortic valve annular calcification.  6. The mitral valve is normal in structure. Mild mitral valve regurgitation. No evidence of mitral stenosis.  7. The tricuspid valve is normal in structure. Tricuspid valve regurgitation is mild.  8. The aortic valve is normal in structure. Aortic valve regurgitation is mild by color flow Doppler. Mild to moderate aortic valve sclerosis/calcification without any evidence of aortic stenosis.  9. The pulmonic valve was normal in structure. Pulmonic valve regurgitation is trivial by color flow Doppler. 10. There is mild dilatation of the ascending aorta measuring 42 mm. 11. Normal pulmonary artery systolic pressure. 12. The inferior vena cava is normal in size with greater than 50% respiratory variability, suggesting right atrial pressure of 3 mmHg.  EKG:  EKG is  ordered today.  The ekg ordered today demonstrates 103 AFIB.  Recent Labs: 09/27/2019: B Natriuretic Peptide 154.0 09/28/2019: ALT 22; TSH 2.066 10/04/2019: BUN 13; Creatinine, Ser 1.18; Hemoglobin 12.7; Magnesium 1.8; Platelets 167; Potassium 3.9; Sodium 140  Recent Lipid Panel    Component Value Date/Time   CHOL 143 01/03/2017 1206   TRIG 64 01/03/2017 1206   HDL 32 (L)  01/03/2017 1206   CHOLHDL 4.5 01/03/2017 1206   VLDL 13 01/03/2017 1206   LDLCALC 98 01/03/2017 1206    Physical Exam:    VS:  BP 110/70   Pulse 100   Ht 5\' 8"  (1.727 m)   Wt 223 lb (101.2 kg)   SpO2 96%   BMI 33.91 kg/m     Wt Readings from Last 3 Encounters:  11/24/19 223 lb (101.2 kg)  09/27/19 160 lb (72.6 kg)  02/26/19 196 lb 10.4 oz (89.2 kg)     GEN:  Well nourished, well developed in no acute, in a wheelchair in now distress HEENT: Normal NECK: No JVD; No carotid bruits LYMPHATICS: No lymphadenopathy CARDIAC: irreg, no murmurs, rubs, gallops RESPIRATORY:  Clear to auscultation without rales, wheezing or rhonchi  ABDOMEN: Soft, non-tender, non-distended MUSCULOSKELETAL:  No edema; No deformity  SKIN: Warm and dry NEUROLOGIC:  Alert  PSYCHIATRIC:  Normal affect   ASSESSMENT:    1. Diabetes mellitus with coincident hypertension ([place])   2. Other acute pulmonary embolism, unspecified whether acute cor pulmonale present (HCC)   3. Permanent atrial fibrillation (HCC)    PLAN:    In order of problems listed above:  Permanent atrial fibrillation -Overall reasonable rate control pulse 90-100.  Continue with Toprol.  Chronic anticoagulation -Xarelto 20 mg a day.  No bleeding  Acute bilateral saddle pulmonary embolism -October 2020-treated with anticoagulation.  Continue given his atrial fibrillation.  Demand ischemia -Low level troponin elevation secondary to PE.  Echocardiogram reassuring as above.  Dementia -Stable with Aricept  32-month follow-up Cecille Rubin, 1 year me   Medication Adjustments/Labs and Tests Ordered: Current medicines are reviewed at length with the patient today.  Concerns regarding medicines are outlined above.  No orders of the defined types were placed in this encounter.  No orders of the defined types were placed in this encounter.   Patient Instructions  Medication Instructions:   Your physician recommends that you continue on  your current medications as directed. Please refer to the Current Medication list given to you today.  *If you need a refill on your cardiac medications before your next appointment, please call  your pharmacy*    Follow-Up:   Your physician wants you to follow-up in: [place] will receive a reminder letter in the mail two months in advance. If you don't receive a letter, please call our office to schedule the follow-up appointment.   At [facility], you and your health needs are our priority.  As part of our continuing mission to provide you with exceptional heart care, we have created designated Provider Care Teams.  These Care Teams include your primary Cardiologist (physician) and Advanced Practice Providers (APPs -  Physician Assistants and Nurse Practitioners) who all work together to provide you with the care you need, when you need it.  Your next appointment:   12 month(s)  The format for your next appointment:   In Person  Provider:   Candee Furbish, MD       Signed, [name] Furbish, MD  11/24/2019 4:03 PM    [place]

## 2019-12-20 DIAGNOSIS — E1129 Type 2 diabetes mellitus with other diabetic kidney complication: Secondary | ICD-10-CM | POA: Diagnosis not present

## 2019-12-20 DIAGNOSIS — L89610 Pressure ulcer of right heel, unstageable: Secondary | ICD-10-CM | POA: Diagnosis not present

## 2019-12-20 DIAGNOSIS — E11621 Type 2 diabetes mellitus with foot ulcer: Secondary | ICD-10-CM | POA: Diagnosis not present

## 2019-12-20 DIAGNOSIS — L97529 Non-pressure chronic ulcer of other part of left foot with unspecified severity: Secondary | ICD-10-CM | POA: Diagnosis not present

## 2019-12-29 ENCOUNTER — Telehealth: Payer: Self-pay | Admitting: Internal Medicine

## 2019-12-29 NOTE — Telephone Encounter (Signed)
Phone call placed to patient to offer to schedule a visit with Palliative Care. Phone rang, with no answer I left a voicemail for call back. 

## 2020-01-04 ENCOUNTER — Other Ambulatory Visit: Payer: Self-pay

## 2020-01-04 ENCOUNTER — Encounter: Payer: Self-pay | Admitting: Orthopedic Surgery

## 2020-01-04 ENCOUNTER — Ambulatory Visit (INDEPENDENT_AMBULATORY_CARE_PROVIDER_SITE_OTHER): Payer: Medicare Other | Admitting: Orthopedic Surgery

## 2020-01-04 VITALS — Ht 68.0 in | Wt 223.0 lb

## 2020-01-04 DIAGNOSIS — I872 Venous insufficiency (chronic) (peripheral): Secondary | ICD-10-CM

## 2020-01-04 NOTE — Progress Notes (Signed)
Office Visit Note   Patient: [name] [name]           Date of Birth: 1926-09-04           MRN: [id number] Visit Date: 01/04/2020              Requested by: Haywood Pao, MD [address] [place],  [place] [address] PCP: Osborne Casco Fransico Him, MD  Chief Complaint  Patient presents with  . Right Foot - Pain, Wound Check  . Left Foot - Pain, Wound Check, New Patient (Initial Visit)      HPI: Patient is a 86 year old gentleman who was seen for initial evaluation venous stasis changes in both legs as well as a right heel decubitus ulcer.  Patient denies any pain complains of swelling primarily in his feet.  Patient presents with his medical assistant.  Assessment & Plan: Visit Diagnoses:  1. Venous stasis dermatitis of both lower extremities     Plan: Recommended knee-high compression stockings to start with a size large to accommodate the swelling in the feet eventually he will need a size medium.  Follow-Up Instructions: Return in about 3 weeks (around 01/25/2020).   Ortho Exam  Patient is alert, oriented, no adenopathy, well-dressed, normal affect, normal respiratory effort. Examination patient is ambulating in a wheelchair.  He has brawny skin color changes in both legs with significant venous swelling in both feet with brawny skin color changes there is no drainage no ulceration no signs of infection.  He has a healing decubitus heel ulcer on the right heel with no open wound at this time.  Calf measures 34 cm in circumference bilaterally currently wearing a size 11 shoe.  A Doppler was used and he has a biphasic dorsalis pedis and posterior tibial pulse bilaterally with an irregular rate consistent with atrial fibrillation.  Imaging: No results found. No images are attached to the encounter.  Labs: Lab Results  Component Value Date   HGBA1C 8.9 (H) 09/28/2019   HGBA1C 7.0 (H) 10/03/2017   HGBA1C 7.2 (H) 01/03/2017   REPTSTATUS 09/30/2019 FINAL 09/28/2019   GRAMSTAIN  01/28/2016    NO WBC SEEN FEW SQUAMOUS EPITHELIAL CELLS PRESENT NO ORGANISMS SEEN THIS SPECIMEN IS ACCEPTABLE FOR SPUTUM CULTURE Performed at [facility] (A) 09/28/2019    >=100,000 COLONIES/mL KOCURIA ROSEA Standardized susceptibility testing for this organism is not available. Performed at [facility], [place] [address] [place], [place] [address]    LABORGA ENTEROCOCCUS SPECIES 01/21/2012     Lab Results  Component Value Date   ALBUMIN 3.1 (L) 09/28/2019   ALBUMIN 3.4 (L) 09/27/2019   ALBUMIN 3.8 02/25/2019    Lab Results  Component Value Date   MG 1.8 10/04/2019   MG 2.1 09/28/2019   MG 2.0 09/27/2019   No results found for: VD25OH  No results found for: PREALBUMIN CBC EXTENDED Latest Ref Rng & Units 10/04/2019 10/02/2019 10/02/2019  WBC 4.0 - 10.5 K/uL 8.0 6.9 7.2  RBC 4.22 - 5.81 MIL/uL 3.94(L) 3.76(L) 3.91(L)  HGB 13.0 - 17.0 g/dL 12.7(L) 12.0(L) 12.5(L)  HCT 39.0 - 52.0 % 37.1(L) 35.5(L) 36.5(L)  PLT 150 - 400 K/uL 167 149(L) 150  NEUTROABS 1.7 - 7.7 K/uL - - -  LYMPHSABS 0.7 - 4.0 K/uL - - -     Body mass index is 33.91 kg/m.  Orders:  No orders of the defined types were placed in this encounter.  No orders of the defined types  were placed in this encounter.    Procedures: No procedures performed  Clinical Data: No additional findings.  ROS:  All other systems negative, except as noted in the HPI. Review of Systems  Objective: Vital Signs: Ht 5\' 8"  (1.727 m)   Wt 223 lb (101.2 kg)   BMI 33.91 kg/m   Specialty Comments:  No specialty comments available.  PMFS History: Patient Active Problem List   Diagnosis Date Noted  . Pulmonary embolism ([place]) 09/28/2019  . CKD (chronic kidney disease), stage III 02/25/2019  . Chronic diastolic CHF (congestive heart failure) ([place]) 02/25/2019  . Gastroenteritis 10/06/2017  . Hypokalemia 10/02/2017  . Dehydration 10/02/2017  . Syncope 10/02/2017  . Cerebral  embolism with cerebral infarction 01/04/2017  . Dementia without behavioral disturbance ([place])   . Falls 01/03/2017  . Generalized weakness 01/01/2017  . Frequent falls 01/01/2017  . Acute hypoxemic respiratory failure ([place]) 04/09/2016  . COPD (chronic obstructive pulmonary disease) with chronic bronchitis ([place]) 03/12/2016  . HCAP (healthcare-associated pneumonia) 02/26/2016  . Elevated troponin 02/26/2016  . Elevated lactic acid level 01/28/2016  . Sepsis ([place]) 01/28/2016  . Acute renal failure superimposed on stage 3 chronic kidney disease ([place]) 04/21/2015  . Hyperglycemia 04/21/2015  . Bronchitis 04/21/2015  . Tachycardia 04/21/2015  . BPH (benign prostatic hyperplasia) 04/21/2015  . Dyspnea 12/28/2012  . Fall 12/28/2012  . OTHER DYSPNEA AND RESPIRATORY ABNORMALITIES 05/27/2010  . Hyperlipidemia 11/27/2008  . HYPERTENSION, BENIGN 11/27/2008  . Atrial fibrillation, chronic 11/27/2008  . GERD 11/27/2008  . BPH/LUTS W/O OBSTRUCTION 11/27/2008  . ARTHRITIS 11/27/2008   Past Medical History:  Diagnosis Date  . Acute hypoxemic respiratory failure ([place]) 04/09/2016  . Acute renal failure superimposed on stage 3 chronic kidney disease ([place]) 04/21/2015  . Arthritis   . ARTHRITIS 11/27/2008   Qualifier: Diagnosis of  By: Cleda Mccreedy, Davis Gourd   . Atrial fibrillation, chronic 11/27/2008   Qualifier: Diagnosis of  By: Cleda Mccreedy, Davis Gourd  Qualifier: Diagnosis of  By: Shelda Pal    . BPH (benign prostatic hyperplasia) 04/21/2015  . BPH/LUTS W/O OBSTRUCTION 11/27/2008   Qualifier: Diagnosis of  By: Cleda Mccreedy, Davis Gourd   . Bronchitis 04/21/2015  . Cerebral embolism with cerebral infarction 01/04/2017  . Chronic atrial fibrillation ([place])   . Chronic diastolic CHF (congestive heart failure) ([place]) 02/25/2019  . CKD (chronic kidney disease), stage III 02/25/2019  . COPD (chronic obstructive pulmonary disease) with chronic bronchitis ([place]) 03/12/2016  . Dehydration 10/02/2017  .  Dementia without behavioral disturbance ([place])   . Diabetes mellitus without complication ([place])   . Dyspnea 12/28/2012  . Elevated lactic acid level 01/28/2016  . Elevated troponin 02/26/2016  . Frequent falls 01/01/2017  . Gastroenteritis 10/06/2017  . Generalized weakness 01/01/2017  . GERD 11/27/2008   Qualifier: Diagnosis of  By: Cleda Mccreedy, Davis Gourd   . HCAP (healthcare-associated pneumonia) 02/26/2016  . Hyperglycemia 04/21/2015  . Hyperlipidemia   . Hypertension   . HYPERTENSION, BENIGN 11/27/2008   Qualifier: Diagnosis of  By: Cleda Mccreedy, Davis Gourd   . Hypokalemia 10/02/2017  . OTHER DYSPNEA AND RESPIRATORY ABNORMALITIES 05/27/2010   Qualifier: Diagnosis of  By: Owens Shark, RN, BSN, Lauren    . Pulmonary embolism ([place]) 09/28/2019  . Sepsis ([place]) 01/28/2016  . Sleep apnea    no cpap used, sleep study was several yrs ago  . Syncope 10/02/2017  . Tachycardia 04/21/2015  . Urinary retention foley last changed 3 weeks ago    Family History  Problem  Relation Age of Onset  . Heart attack Father     Past Surgical History:  Procedure Laterality Date  . BACK SURGERY  yrs ago   lower back surgery  . PROSTATE SURGERY  yrs ago, no cancer  . TONSILLECTOMY  as child  . TRANSURETHRAL RESECTION OF PROSTATE  02/22/2012   Procedure: TRANSURETHRAL RESECTION OF THE PROSTATE WITH GYRUS INSTRUMENTS;  Surgeon: Molli Hazard, MD;  Location: [facility];  Service: Urology;  Laterality: N/A;       Social History   Occupational History  . Not on file  Tobacco Use  . Smoking status: Former Smoker    Packs/day: 0.50    Years: 50.00    Pack years: 25.00    Types: Cigarettes    Quit date: 12/21/1980    Years since quitting: 39.0  . Smokeless tobacco: Never Used  Substance and Sexual Activity  . Alcohol use: Yes    Alcohol/week: 0.0 standard drinks    Comment: rare use  . Drug use: No  . Sexual activity: Never

## 2020-01-05 ENCOUNTER — Telehealth: Payer: Self-pay | Admitting: Internal Medicine

## 2020-01-05 NOTE — Telephone Encounter (Signed)
[facility] Palliative visit for 01-15-20 at 11:00.

## 2020-01-15 ENCOUNTER — Other Ambulatory Visit: Payer: Self-pay

## 2020-01-15 ENCOUNTER — Other Ambulatory Visit: Payer: Self-pay | Admitting: Internal Medicine

## 2020-01-25 ENCOUNTER — Inpatient Hospital Stay (HOSPITAL_COMMUNITY)
Admission: EM | Admit: 2020-01-25 | Discharge: 2020-01-29 | DRG: 436 | Disposition: A | Discharge status: Hospice | Payer: Medicare Other | Attending: Family Medicine | Admitting: Family Medicine

## 2020-01-25 ENCOUNTER — Emergency Department (HOSPITAL_COMMUNITY): Payer: Medicare Other

## 2020-01-25 ENCOUNTER — Other Ambulatory Visit: Payer: Self-pay

## 2020-01-25 ENCOUNTER — Ambulatory Visit: Payer: Medicare Other | Admitting: Orthopedic Surgery

## 2020-01-25 DIAGNOSIS — E1121 Type 2 diabetes mellitus with diabetic nephropathy: Secondary | ICD-10-CM | POA: Diagnosis not present

## 2020-01-25 DIAGNOSIS — K831 Obstruction of bile duct: Secondary | ICD-10-CM | POA: Diagnosis not present

## 2020-01-25 DIAGNOSIS — E1122 Type 2 diabetes mellitus with diabetic chronic kidney disease: Secondary | ICD-10-CM | POA: Diagnosis present

## 2020-01-25 DIAGNOSIS — I130 Hypertensive heart and chronic kidney disease with heart failure and stage 1 through stage 4 chronic kidney disease, or unspecified chronic kidney disease: Secondary | ICD-10-CM | POA: Diagnosis present

## 2020-01-25 DIAGNOSIS — Z8546 Personal history of malignant neoplasm of prostate: Secondary | ICD-10-CM

## 2020-01-25 DIAGNOSIS — Z515 Encounter for palliative care: Secondary | ICD-10-CM | POA: Diagnosis present

## 2020-01-25 DIAGNOSIS — E860 Dehydration: Secondary | ICD-10-CM | POA: Diagnosis present

## 2020-01-25 DIAGNOSIS — N1831 Chronic kidney disease, stage 3a: Secondary | ICD-10-CM | POA: Diagnosis present

## 2020-01-25 DIAGNOSIS — E876 Hypokalemia: Secondary | ICD-10-CM | POA: Diagnosis present

## 2020-01-25 DIAGNOSIS — I482 Chronic atrial fibrillation, unspecified: Secondary | ICD-10-CM | POA: Diagnosis present

## 2020-01-25 DIAGNOSIS — I69354 Hemiplegia and hemiparesis following cerebral infarction affecting left non-dominant side: Secondary | ICD-10-CM

## 2020-01-25 DIAGNOSIS — N183 Chronic kidney disease, stage 3 unspecified: Secondary | ICD-10-CM

## 2020-01-25 DIAGNOSIS — L299 Pruritus, unspecified: Secondary | ICD-10-CM | POA: Diagnosis present

## 2020-01-25 DIAGNOSIS — E871 Hypo-osmolality and hyponatremia: Secondary | ICD-10-CM | POA: Diagnosis present

## 2020-01-25 DIAGNOSIS — R945 Abnormal results of liver function studies: Secondary | ICD-10-CM | POA: Diagnosis present

## 2020-01-25 DIAGNOSIS — F0390 Unspecified dementia without behavioral disturbance: Secondary | ICD-10-CM | POA: Diagnosis present

## 2020-01-25 DIAGNOSIS — C250 Malignant neoplasm of head of pancreas: Principal | ICD-10-CM | POA: Diagnosis present

## 2020-01-25 DIAGNOSIS — K7689 Other specified diseases of liver: Secondary | ICD-10-CM

## 2020-01-25 DIAGNOSIS — N179 Acute kidney failure, unspecified: Secondary | ICD-10-CM | POA: Diagnosis present

## 2020-01-25 DIAGNOSIS — Z7189 Other specified counseling: Secondary | ICD-10-CM

## 2020-01-25 DIAGNOSIS — Z66 Do not resuscitate: Secondary | ICD-10-CM | POA: Diagnosis present

## 2020-01-25 DIAGNOSIS — Z20822 Contact with and (suspected) exposure to covid-19: Secondary | ICD-10-CM | POA: Diagnosis present

## 2020-01-25 DIAGNOSIS — K8021 Calculus of gallbladder without cholecystitis with obstruction: Secondary | ICD-10-CM | POA: Diagnosis present

## 2020-01-25 DIAGNOSIS — Z794 Long term (current) use of insulin: Secondary | ICD-10-CM | POA: Diagnosis not present

## 2020-01-25 DIAGNOSIS — R7989 Other specified abnormal findings of blood chemistry: Secondary | ICD-10-CM | POA: Diagnosis present

## 2020-01-25 DIAGNOSIS — K8689 Other specified diseases of pancreas: Secondary | ICD-10-CM | POA: Diagnosis not present

## 2020-01-25 DIAGNOSIS — Z7901 Long term (current) use of anticoagulants: Secondary | ICD-10-CM

## 2020-01-25 DIAGNOSIS — G4733 Obstructive sleep apnea (adult) (pediatric): Secondary | ICD-10-CM | POA: Diagnosis present

## 2020-01-25 DIAGNOSIS — Z86711 Personal history of pulmonary embolism: Secondary | ICD-10-CM | POA: Diagnosis not present

## 2020-01-25 DIAGNOSIS — R000 Tachycardia, unspecified: Secondary | ICD-10-CM | POA: Diagnosis not present

## 2020-01-25 DIAGNOSIS — I5032 Chronic diastolic (congestive) heart failure: Secondary | ICD-10-CM | POA: Diagnosis present

## 2020-01-25 DIAGNOSIS — Z9079 Acquired absence of other genital organ(s): Secondary | ICD-10-CM

## 2020-01-25 DIAGNOSIS — Z8249 Family history of ischemic heart disease and other diseases of the circulatory system: Secondary | ICD-10-CM | POA: Diagnosis not present

## 2020-01-25 DIAGNOSIS — Z882 Allergy status to sulfonamides status: Secondary | ICD-10-CM

## 2020-01-25 LAB — CBC WITH DIFFERENTIAL/PLATELET
Abs Immature Granulocytes: 0.07 10*3/uL (ref 0.00–0.07)
Basophils Absolute: 0.1 10*3/uL (ref 0.0–0.1)
Basophils Relative: 1 %
Eosinophils Absolute: 0.2 10*3/uL (ref 0.0–0.5)
Eosinophils Relative: 2 %
HCT: 38.6 % — ABNORMAL LOW (ref 39.0–52.0)
Hemoglobin: 13.1 g/dL (ref 13.0–17.0)
Immature Granulocytes: 1 %
Lymphocytes Relative: 5 %
Lymphs Abs: 0.5 10*3/uL — ABNORMAL LOW (ref 0.7–4.0)
MCH: 32.1 pg (ref 26.0–34.0)
MCHC: 33.9 g/dL (ref 30.0–36.0)
MCV: 94.6 fL (ref 80.0–100.0)
Monocytes Absolute: 0.6 10*3/uL (ref 0.1–1.0)
Monocytes Relative: 5 %
Neutro Abs: 9 10*3/uL — ABNORMAL HIGH (ref 1.7–7.7)
Neutrophils Relative %: 86 %
Platelets: 181 10*3/uL (ref 150–400)
RBC: 4.08 MIL/uL — ABNORMAL LOW (ref 4.22–5.81)
RDW: 14.7 % (ref 11.5–15.5)
WBC: 10.4 10*3/uL (ref 4.0–10.5)
nRBC: 0 % (ref 0.0–0.2)

## 2020-01-25 LAB — COMPREHENSIVE METABOLIC PANEL
ALT: 606 U/L — ABNORMAL HIGH (ref 0–44)
AST: 973 U/L — ABNORMAL HIGH (ref 15–41)
Albumin: 2.8 g/dL — ABNORMAL LOW (ref 3.5–5.0)
Alkaline Phosphatase: 1607 U/L — ABNORMAL HIGH (ref 38–126)
Anion gap: 17 — ABNORMAL HIGH (ref 5–15)
BUN: 43 mg/dL — ABNORMAL HIGH (ref 8–23)
CO2: 23 mmol/L (ref 22–32)
Calcium: 9.1 mg/dL (ref 8.9–10.3)
Chloride: 91 mmol/L — ABNORMAL LOW (ref 98–111)
Creatinine, Ser: 3.07 mg/dL — ABNORMAL HIGH (ref 0.61–1.24)
GFR calc Af Amer: 19 mL/min — ABNORMAL LOW (ref >60)
GFR calc non Af Amer: 17 mL/min — ABNORMAL LOW (ref >60)
Glucose, Bld: 210 mg/dL — ABNORMAL HIGH (ref 70–99)
Potassium: 3 mmol/L — ABNORMAL LOW (ref 3.5–5.1)
Sodium: 131 mmol/L — ABNORMAL LOW (ref 135–145)
Total Bilirubin: 12.6 mg/dL — ABNORMAL HIGH (ref 0.3–1.2)
Total Protein: 6.9 g/dL (ref 6.5–8.1)

## 2020-01-25 LAB — I-STAT CHEM 8, ED
BUN: 40 mg/dL — ABNORMAL HIGH (ref 8–23)
Calcium, Ion: 1.08 mmol/L — ABNORMAL LOW (ref 1.15–1.40)
Chloride: 94 mmol/L — ABNORMAL LOW (ref 98–111)
Creatinine, Ser: 2.9 mg/dL — ABNORMAL HIGH (ref 0.61–1.24)
Glucose, Bld: 200 mg/dL — ABNORMAL HIGH (ref 70–99)
HCT: 43 % (ref 39.0–52.0)
Hemoglobin: 14.6 g/dL (ref 13.0–17.0)
Potassium: 3 mmol/L — ABNORMAL LOW (ref 3.5–5.1)
Sodium: 133 mmol/L — ABNORMAL LOW (ref 135–145)
TCO2: 26 mmol/L (ref 22–32)

## 2020-01-25 LAB — URINALYSIS, ROUTINE W REFLEX MICROSCOPIC
Bilirubin Urine: NEGATIVE
Glucose, UA: NEGATIVE mg/dL
Ketones, ur: NEGATIVE mg/dL
Nitrite: NEGATIVE
Protein, ur: 30 mg/dL — AB
RBC / HPF: >50 RBC/hpf — ABNORMAL HIGH (ref 0–5)
Specific Gravity, Urine: 1.011 (ref 1.005–1.030)
pH: 5 (ref 5.0–8.0)

## 2020-01-25 LAB — CBG MONITORING, ED: Glucose-Capillary: 175 mg/dL — ABNORMAL HIGH (ref 70–99)

## 2020-01-25 LAB — LACTIC ACID, PLASMA
Lactic Acid, Venous: 2.1 mmol/L (ref 0.5–1.9)
Lactic Acid, Venous: 1.4 mmol/L (ref 0.5–1.9)

## 2020-01-25 LAB — RESPIRATORY PANEL BY RT PCR (FLU A&B, COVID)
Influenza A by PCR: NEGATIVE
Influenza B by PCR: NEGATIVE
SARS Coronavirus 2 by RT PCR: NEGATIVE

## 2020-01-25 LAB — LIPASE, BLOOD: Lipase: 43 U/L (ref 11–51)

## 2020-01-25 LAB — POC SARS CORONAVIRUS 2 AG -  ED: SARS Coronavirus 2 Ag: NEGATIVE

## 2020-01-25 LAB — AMMONIA: Ammonia: 44 umol/L — ABNORMAL HIGH (ref 9–35)

## 2020-01-25 LAB — ETHANOL: Alcohol, Ethyl (B): <10 mg/dL (ref <10)

## 2020-01-25 MED ORDER — POTASSIUM CHLORIDE 10 MEQ/100ML IV SOLN
10.0000 meq | INTRAVENOUS | Status: AC
  Administered 2020-01-25 (×2): 10 meq via INTRAVENOUS
  Filled 2020-01-25 (×2): qty 100

## 2020-01-25 MED ORDER — SODIUM CHLORIDE 0.9 % IV BOLUS
1000.0000 mL | Freq: Once | INTRAVENOUS | Status: AC
  Administered 2020-01-25: 1000 mL via INTRAVENOUS

## 2020-01-25 MED ORDER — FINASTERIDE 5 MG PO TABS
5.0000 mg | ORAL_TABLET | Freq: Every day | ORAL | Status: DC
  Administered 2020-01-25 – 2020-01-26 (×2): 5 mg via ORAL
  Filled 2020-01-25 (×2): qty 1

## 2020-01-25 MED ORDER — HEPARIN SODIUM (PORCINE) 5000 UNIT/ML IJ SOLN
5000.0000 [IU] | Freq: Three times a day (TID) | INTRAMUSCULAR | Status: DC
  Administered 2020-01-25 – 2020-01-27 (×7): 5000 [IU] via SUBCUTANEOUS
  Filled 2020-01-25 (×7): qty 1

## 2020-01-25 MED ORDER — LACTATED RINGERS IV SOLN: INTRAVENOUS | Status: DC

## 2020-01-25 NOTE — ED Triage Notes (Signed)
Pt here from home, where he has a home healthcare provider saw him at his baseline at 5 pm yesterday. Today she came to see him and he is much weaker, not himself, and more confused. Pt's color is normal. Pt has cough with white sputum.

## 2020-01-25 NOTE — ED Notes (Signed)
Patient given graham crackers and water.  

## 2020-01-25 NOTE — ED Notes (Signed)
Patient transported to CT 

## 2020-01-25 NOTE — H&P (Addendum)
History and Physical    AMRITPAL [name] [id number] DOB: Sep 25, 1926 DOA: 01/25/2020  PCP: Haywood Pao, MD Patient coming from:home  Chief Complaint:generalized weakness and change in ms  HPI: [name] [name] is a 86 y.o. male with medical history significant of ckd stage 3 chronic afib cva diastolic chf admitted with generalized weakness and change in mental status.  Patient lives at home with 24-hour care.  When his caregiver came in today she noticed him acting differently did not recognize her history is obtained from the ER records patient's son and some from the patient.  When I saw him he was awake oriented to hospital.  He told me he is decreased his p.o. intake for the last few weeks and has lost some weight denies nausea vomiting diarrhea abdominal pain or urinary complaints.  Denies chest pain shortness of breath or cough.  Denies fever or chills Decreased appetite for past few weeks with unknown weight loss Patient reports pruritus with yellowish discoloration of the skin.  ED Course: received ivf and kcl.98/52,93,17,afebrile 96 on ra  NA 133,K 3.0,BUN 40,CR 2.90,HB 14.6 COVID NEGATIVE UA trace leukocytes  21 to 50 wbc  CT ABD -Mass in the pancreatic head may measure as large as 4.4 x 4.8 cm, associated with marked biliary and pancreatic ductal dilation. Soft tissue abuts the splenic portal venous system at the level of the root of the mesentery. Findings of what is likely a pancreatic neoplasm with biliary pancreatic ductal obstruction. Contrasted imaging or endoscopic assessment may be helpful. Cholelithiasis.  Renal cortical scarring with multiple cysts of varying complexity bilaterally. No hydronephrosis or ureteral calculus.  Colonic diverticulosis.  Review of Systems: As per HPI otherwise all other systems reviewed and are negative  Ambulatory Status:WALKS with walker   Past Medical History:  Diagnosis Date  . Acute hypoxemic respiratory failure ([place])  04/09/2016  . Acute renal failure superimposed on stage 3 chronic kidney disease ([place]) 04/21/2015  . Arthritis   . ARTHRITIS 11/27/2008   Qualifier: Diagnosis of  By: Cleda Mccreedy, Davis Gourd   . Atrial fibrillation, chronic 11/27/2008   Qualifier: Diagnosis of  By: Cleda Mccreedy, Davis Gourd  Qualifier: Diagnosis of  By: Shelda Pal    . BPH (benign prostatic hyperplasia) 04/21/2015  . BPH/LUTS W/O OBSTRUCTION 11/27/2008   Qualifier: Diagnosis of  By: Cleda Mccreedy, Davis Gourd   . Bronchitis 04/21/2015  . Cerebral embolism with cerebral infarction 01/04/2017  . Chronic atrial fibrillation ([place])   . Chronic diastolic CHF (congestive heart failure) ([place]) 02/25/2019  . CKD (chronic kidney disease), stage III 02/25/2019  . COPD (chronic obstructive pulmonary disease) with chronic bronchitis ([place]) 03/12/2016  . Dehydration 10/02/2017  . Dementia without behavioral disturbance ([place])   . Diabetes mellitus without complication ([place])   . Dyspnea 12/28/2012  . Elevated lactic acid level 01/28/2016  . Elevated troponin 02/26/2016  . Frequent falls 01/01/2017  . Gastroenteritis 10/06/2017  . Generalized weakness 01/01/2017  . GERD 11/27/2008   Qualifier: Diagnosis of  By: Cleda Mccreedy, Davis Gourd   . HCAP (healthcare-associated pneumonia) 02/26/2016  . Hyperglycemia 04/21/2015  . Hyperlipidemia   . Hypertension   . HYPERTENSION, BENIGN 11/27/2008   Qualifier: Diagnosis of  By: Cleda Mccreedy, Davis Gourd   . Hypokalemia 10/02/2017  . OTHER DYSPNEA AND RESPIRATORY ABNORMALITIES 05/27/2010   Qualifier: Diagnosis of  By: Owens Shark, RN, BSN, Lauren    . Pulmonary embolism ([place]) 09/28/2019  . Sepsis ([place]) 01/28/2016  . Sleep apnea  no cpap used, sleep study was several yrs ago  . Syncope 10/02/2017  . Tachycardia 04/21/2015  . Urinary retention foley last changed 3 weeks ago    Past Surgical History:  Procedure Laterality Date  . BACK SURGERY  yrs ago   lower back surgery  . PROSTATE SURGERY  yrs ago, no  cancer  . TONSILLECTOMY  as child  . TRANSURETHRAL RESECTION OF PROSTATE  02/22/2012   Procedure: TRANSURETHRAL RESECTION OF THE PROSTATE WITH GYRUS INSTRUMENTS;  Surgeon: Molli Hazard, MD;  Location: [facility];  Service: Urology;  Laterality: N/A;        Social History   Socioeconomic History  . Marital status: Married    Spouse name: Not on file  . Number of children: Not on file  . Years of education: Not on file  . Highest education level: Not on file  Occupational History  . Not on file  Tobacco Use  . Smoking status: Former Smoker    Packs/day: 0.50    Years: 50.00    Pack years: 25.00    Types: Cigarettes    Quit date: 12/21/1980    Years since quitting: 39.1  . Smokeless tobacco: Never Used  Substance and Sexual Activity  . Alcohol use: Yes    Alcohol/week: 0.0 standard drinks    Comment: rare use  . Drug use: No  . Sexual activity: Never  Other Topics Concern  . Not on file  Social History Narrative  . Not on file   Social Determinants of Health   Financial Resource Strain:   . Difficulty of Paying Living Expenses: Not on file  Food Insecurity:   . Worried About Charity fundraiser in the Last Year: Not on file  . Ran Out of Food in the Last Year: Not on file  Transportation Needs:   . Lack of Transportation (Medical): Not on file  . Lack of Transportation (Non-Medical): Not on file  Physical Activity:   . Days of Exercise per Week: Not on file  . Minutes of Exercise per Session: Not on file  Stress:   . Feeling of Stress : Not on file  Social Connections:   . Frequency of Communication with Friends and Family: Not on file  . Frequency of Social Gatherings with Friends and Family: Not on file  . Attends Religious Services: Not on file  . Active Member of Clubs or Organizations: Not on file  . Attends Archivist Meetings: Not on file  . Marital Status: Not on file  Intimate Partner Violence:   . Fear of Current or Ex-Partner: Not on  file  . Emotionally Abused: Not on file  . Physically Abused: Not on file  . Sexually Abused: Not on file    Allergies  Allergen Reactions  . Sulfa Antibiotics Swelling    Swelling of hands    Family History  Problem Relation Age of Onset  . Heart attack Father       Prior to Admission medications   Medication Sig Start Date End Date Taking? Authorizing Provider  atorvastatin (LIPITOR) 20 MG tablet Take 1 tablet (20 mg total) by mouth daily at 6 PM. 01/04/17  Yes Florencia Reasons, MD  donepezil (ARICEPT) 10 MG tablet Take 10 mg by mouth at bedtime.  01/09/16  Yes [provider]  finasteride (PROSCAR) 5 MG tablet Take 5 mg by mouth at bedtime.    Yes [provider]  furosemide (LASIX) 20 MG tablet Take 20 mg  by mouth every morning. 12/04/19  Yes [provider]  glimepiride (AMARYL) 2 MG tablet Take 2 mg by mouth daily with breakfast.  12/16/18  Yes [provider]  Melatonin 10 MG TABS Take 10 mg by mouth at bedtime.   Yes [provider]  metoprolol succinate (TOPROL-XL) 50 MG 24 hr tablet Take 1 tablet (50 mg total) by mouth at bedtime. Patient is overdue for an appointment and needs to schedule for further refills 2nd attempt 10/01/17  Yes Sherren Mocha, MD  mupirocin ointment (BACTROBAN) 2 % Apply 1 application topically 2 (two) times daily. To foot ulcer 12/20/19  Yes [provider]  rivaroxaban (XARELTO) 20 MG TABS tablet Take 1 tablet (20 mg total) by mouth daily with supper. 10/12/19  Yes Mikhail, Maryann, DO  insulin aspart (NOVOLOG) 100 UNIT/ML injection Inject 0-9 Units into the skin 3 (three) times daily with meals. CBG 121 - 150: 1 unit,   CBG 151 - 200: 2 units,   CBG 201 - 250: 3 units,   CBG 251 - 300: 5 units,   CBG 301 - 350: 7 units,   CBG 351 - 400: 9 units Patient not taking: Reported on 01/25/2020 10/06/19   Cristal Ford, DO  insulin glargine (LANTUS) 100 UNIT/ML injection Inject 0.2 mLs (20 Units total) into  the skin daily. Patient not taking: Reported on 01/25/2020 10/07/19   Cristal Ford, DO  Rivaroxaban (XARELTO) 15 MG TABS tablet Take 1 tablet (15 mg total) by mouth 2 (two) times daily for 5 days. Patient not taking: Reported on 01/25/2020 10/06/19 01/25/20  Cristal Ford, DO    Physical Exam: Vitals:   01/25/20 1330 01/25/20 1345 01/25/20 1409 01/25/20 1435  BP: 93/68 (!) 98/59 99/63 (!) 98/52  Pulse: 78 87 98 74  Resp: 13 16 (!) 22 17  Temp:      TempSrc:      SpO2: 96% 99% 97% 96%  Weight:      Height:         . General:  Appears calm and comfortable . Eyes:  PERRL, EOMI, normal lids, iris scleral icterus noted . ENT: grossly normal hearing, lips & tongue, mmm . Neck:  no LAD, masses or thyromegaly . Cardiovascular:  RRR, no m/r/g. No LE edema.  Marland Kitchen Respiratory:  CTA bilaterally, no w/r/r. Normal respiratory effort. . Abdomen: soft, ntnd, NABS . Skin: Jaundiced .  musculoskeletal:  grossly normal tone BUE/BLE, good ROM, no bony abnormality . Psychiatric:  grossly normal mood and affect, speech fluent and appropriate, AOx3 . Neurologic:  CN 2-12 grossly intact, moves all extremities in coordinated fashion, sensation intact  Labs on Admission: I have personally reviewed following labs and imaging studies  CBC: Recent Labs  Lab 01/25/20 1033 01/25/20 1152  WBC 10.4  --   NEUTROABS 9.0*  --   HGB 13.1 14.6  HCT 38.6* 43.0  MCV 94.6  --   PLT 181  --    Basic Metabolic Panel: Recent Labs  Lab 01/25/20 1033 01/25/20 1152  NA 131* 133*  K 3.0* 3.0*  CL 91* 94*  CO2 23  --   GLUCOSE 210* 200*  BUN 43* 40*  CREATININE 3.07* 2.90*  CALCIUM 9.1  --    GFR: Estimated Creatinine Clearance: 18.7 mL/min (A) (by C-G formula based on SCr of 2.9 mg/dL (H)). Liver Function Tests: Recent Labs  Lab 01/25/20 1033  AST 973*  ALT 606*  ALKPHOS 1,607*  BILITOT 12.6*  PROT 6.9  ALBUMIN 2.8*   Recent Labs  Lab 01/25/20 1202  LIPASE 43   Recent Labs  Lab  01/25/20 1033  AMMONIA 44*   Coagulation Profile: No results for input(s): INR, PROTIME in the last 168 hours. Cardiac Enzymes: No results for input(s): CKTOTAL, CKMB, CKMBINDEX, TROPONINI in the last 168 hours. BNP (last 3 results) No results for input(s): PROBNP in the last 8760 hours. HbA1C: No results for input(s): HGBA1C in the last 72 hours. CBG: Recent Labs  Lab 01/25/20 1003  GLUCAP 175*   Lipid Profile: No results for input(s): CHOL, HDL, LDLCALC, TRIG, CHOLHDL, LDLDIRECT in the last 72 hours. Thyroid Function Tests: No results for input(s): TSH, T4TOTAL, FREET4, T3FREE, THYROIDAB in the last 72 hours. Anemia Panel: No results for input(s): VITAMINB12, FOLATE, FERRITIN, TIBC, IRON, RETICCTPCT in the last 72 hours. Urine analysis:    Component Value Date/Time   COLORURINE AMBER (A) 01/25/2020 1410   APPEARANCEUR HAZY (A) 01/25/2020 1410   LABSPEC 1.011 01/25/2020 1410   PHURINE 5.0 01/25/2020 1410   GLUCOSEU NEGATIVE 01/25/2020 1410   HGBUR LARGE (A) 01/25/2020 1410   BILIRUBINUR NEGATIVE 01/25/2020 1410   KETONESUR NEGATIVE 01/25/2020 1410   PROTEINUR 30 (A) 01/25/2020 1410   UROBILINOGEN 1.0 01/21/2012 2347   NITRITE NEGATIVE 01/25/2020 1410   LEUKOCYTESUR TRACE (A) 01/25/2020 1410    Creatinine Clearance: Estimated Creatinine Clearance: 18.7 mL/min (A) (by C-G formula based on SCr of 2.9 mg/dL (H)).  Sepsis Labs: @LABRCNTIP (procalcitonin:4,lacticidven:4) ) Recent Results (from the past 240 hour(s))  Respiratory Panel by RT PCR (Flu A&B, Covid) - Nasopharyngeal Swab     Status: None   Collection Time: 01/25/20 11:47 AM   Specimen: Nasopharyngeal Swab  Result Value Ref Range Status   SARS Coronavirus 2 by RT PCR NEGATIVE NEGATIVE Final    Comment: (NOTE) SARS-CoV-2 target nucleic acids are NOT DETECTED. The SARS-CoV-2 RNA is generally detectable in upper respiratoy specimens during the acute phase of infection. The lowest concentration of SARS-CoV-2  viral copies this assay can detect is 131 copies/mL. A negative result does not preclude SARS-Cov-2 infection and should not be used as the sole basis for treatment or other patient management decisions. A negative result may occur with  improper specimen collection/handling, submission of specimen other than nasopharyngeal swab, presence of viral mutation(s) within the areas targeted by this assay, and inadequate number of viral copies (<131 copies/mL). A negative result must be combined with clinical observations, patient history, and epidemiological information. The expected result is Negative. Fact Sheet for Patients:  PinkCheek.be Fact Sheet for Healthcare Providers:  GravelBags.it This test is not yet ap proved or cleared by the [place] FDA and  has been authorized for detection and/or diagnosis of SARS-CoV-2 by FDA under an Emergency Use Authorization (EUA). This EUA will remain  in effect (meaning this test can be used) for the duration of the COVID-19 declaration under Section 564(b)(1) of the Act, 21 U.S.C. section 360bbb-3(b)(1), unless the authorization is terminated or revoked sooner.    Influenza A by PCR NEGATIVE NEGATIVE Final   Influenza B by PCR NEGATIVE NEGATIVE Final    Comment: (NOTE) The Xpert Xpress SARS-CoV-2/FLU/RSV assay is intended as an aid in  the diagnosis of influenza from Nasopharyngeal swab specimens and  should not be used as a sole basis for treatment. Nasal washings and  aspirates are unacceptable for Xpert Xpress SARS-CoV-2/FLU/RSV  testing. Fact Sheet for Patients: PinkCheek.be Fact Sheet for Healthcare Providers: GravelBags.it This test is not yet approved  or cleared by the [place] and  has been authorized for detection and/or diagnosis of SARS-CoV-2 by  FDA under an Emergency Use Authorization (EUA). This EUA will  remain  in effect (meaning this test can be used) for the duration of the  Covid-19 declaration under Section 564(b)(1) of the Act, 21  U.S.C. section 360bbb-3(b)(1), unless the authorization is  terminated or revoked. Performed at [facility], [place] [address] [place], [place] [address]      Radiological Exams on Admission: CT ABDOMEN PELVIS WO CONTRAST  Result Date: 01/25/2020 CLINICAL DATA:  Jaundice EXAM: CT ABDOMEN AND PELVIS WITHOUT CONTRAST TECHNIQUE: Multidetector CT imaging of the abdomen and pelvis was performed following the standard protocol without IV contrast. COMPARISON:  CT angiography of the chest dated 09/27/2019 and CT of the abdomen and pelvis from 2005 FINDINGS: Lower chest: No signs of consolidation or evidence of pleural effusion. No pericardial fluid. Hepatobiliary: Massive biliary ductal distension. Masslike appearance of pancreatic head may measure as large as 4.4 x 4.8 cm, associated with ductal dilation of the pancreas. Soft tissue abuts the splenic portal venous system at the level of the root of the mesentery. Stranding extends towards the SMA. Not well assessed without contrast. Cholelithiasis. Pancreas: As above with mass in the pancreatic head and biliary and pancreatic ductal dilation. Spleen: Normal spleen. Adrenals/Urinary Tract: Normal adrenal glands. Renal cortical scarring. Cysts of varying complexity bilaterally. Renal vascular calcification. No hydronephrosis or ureteral calculus. Stomach/Bowel: No signs of acute gastrointestinal process. Colonic diverticulosis. Vascular/Lymphatic: Calcified atherosclerotic plaque throughout the abdominal aorta. No aneurysm. No adenopathy in the upper abdomen. No signs of pelvic lymphadenopathy. Reproductive: Prostate with calcification and heterogeneity likely with TURP. Other: No abdominal wall hernia or abnormality. No abdominopelvic ascites. Musculoskeletal: Spinal degenerative changes and signs of lumbosacral spinal  fusion. No signs of acute bone finding or destructive bone process. IMPRESSION: 1. Mass in the pancreatic head may measure as large as 4.4 x 4.8 cm, associated with marked biliary and pancreatic ductal dilation. Soft tissue abuts the splenic portal venous system at the level of the root of the mesentery. Findings of what is likely a pancreatic neoplasm with biliary pancreatic ductal obstruction. Contrasted imaging or endoscopic assessment may be helpful. 2. Cholelithiasis. 3. Renal cortical scarring with multiple cysts of varying complexity bilaterally. No hydronephrosis or ureteral calculus. 4. Colonic diverticulosis. Aortic Atherosclerosis (ICD10-I70.0). Electronically Signed   By: Zetta Bills M.D.   On: 01/25/2020 14:49   CT Head Wo Contrast  Result Date: 01/25/2020 CLINICAL DATA:  Altered level of consciousness, weakness, confusion EXAM: CT HEAD WITHOUT CONTRAST TECHNIQUE: Contiguous axial images were obtained from the base of the skull through the vertex without intravenous contrast. COMPARISON:  09/27/2019 FINDINGS: Brain: Chronic confluent hypodensities throughout the periventricular and subcortical white matter consistent with chronic small vessel ischemic changes. No signs of acute infarct or hemorrhage. Ex vacuo dilatation of lateral ventricles again noted and stable. Remaining midline structures are unremarkable. No acute extra-axial fluid collections. No mass effect. Vascular: No hyperdense vessel or unexpected calcification. Skull: Normal. Negative for fracture or focal lesion. Sinuses/Orbits: No acute finding. Other: None IMPRESSION: 1. Chronic ischemic changes as above.  No acute process. Electronically Signed   By: Randa Ngo M.D.   On: 01/25/2020 11:09   DG Chest Portable 1 View  Result Date: 01/25/2020 CLINICAL DATA:  Altered mental status. EXAM: PORTABLE CHEST 1 VIEW COMPARISON:  September 27, 2019. FINDINGS: The heart size and mediastinal contours are within  normal limits. Both lungs  are clear. The visualized skeletal structures are unremarkable. IMPRESSION: No active disease. Aortic Atherosclerosis (ICD10-I70.0). Electronically Signed   By: Marijo Conception M.D.   On: 01/25/2020 10:15     Assessment/Plan Active Problems:   Pancreatic mass    #1 aki on ckd stage 3 secondary to dehydration-creatinine already improved with IV fluids in the ER.  We will continue IV hydration.  #2 pancreatic mass likely malignant-ED physician has consulted GI.  Will consult palliative care.  I discussed this with patient's son agrees with palliative care and DNR.  #3abnormal LFT S-likely secondary to #2  #4hypotension/dehydration-hold Lasix and beta-blocker.  #5 hypokalemia/hyponatremia-continue to decreased nutritional intake and dehydration.  Will replete continue IV fluids and recheck in a.m.  Hold Lasix  #6 type 2 diabetes on Amaryl hold Amaryl will start SSI.  Severity of Illness: The appropriate patient status for this patient is INPATIENT. Inpatient status is judged to be reasonable and necessary in order to provide the required intensity of service to ensure the patient's safety. The patient's presenting symptoms, physical exam findings, and initial radiographic and laboratory data in the context of their chronic comorbidities is felt to place them at high risk for further clinical deterioration. Furthermore, it is not anticipated that the patient will be medically stable for discharge from the hospital within 2 midnights of admission. The following factors support the patient status of inpatient.   " The patient's presenting symptoms include decreased p.o. intake decreased appetite weight loss generalized weakness and confusion " The worrisome physical exam findings include dehydration " The initial radiographic and laboratory data are worrisome because of hypotension CT findings of pancreatic mass elevated LFTs " The chronic co-morbidities include .  Type 2 diabetes CKD   * I  certify that at the point of admission it is my clinical judgment that the patient will require inpatient hospital care spanning beyond 2 midnights from the point of admission due to high intensity of service, high risk for further deterioration and high frequency of surveillance required.*  Estimated body mass index is 33.91 kg/m as calculated from the following:   Height as of this encounter: 5\' 8"  (1.727 m).   Weight as of this encounter: 101.2 kg.   DVT prophylaxis: Heparin Code Status: DO NOT RESUSCITATE Family Communication: Discussed with son Disposition Plan: Pending further work-up Consults called: ED physician called GI consult Admission status: Inpatient   Georgette Shell MD [facility]  If 7PM-7AM, please contact night-coverage [email or web address] Password [facility]  01/25/2020, 3:55 PM

## 2020-01-25 NOTE — ED Notes (Signed)
Attempted in and out cath for urine, unsuccessful 

## 2020-01-25 NOTE — ED Provider Notes (Signed)
[facility] EMERGENCY DEPARTMENT Provider Note   CSN: [id number] Arrival date & time: 01/25/20  0945  LEVEL 5 CAVEAT - ALTERED MENTAL STATUS  History Chief Complaint  Patient presents with  . Weakness  . Altered Mental Status    [name] [name] is a 86 y.o. male.  HPI 86 year old male presents with altered mental status and weakness.  History is from EMS, son over the phone, and CNA over the phone.  Patient has dementia and is more confused today and cannot really contribute to the history.  Per son and CNA, patient has had decreased appetite over the last couple weeks.  Today when on the toilet he could not get off on his own and was weaker.  At first CNA says he was leaning to the right and then later to the left.  No fever.  Chronically has some cough with sputum but not really changed.  He is usually disoriented to dates but today he did not recognize the CNA which concerned her.   Past Medical History:  Diagnosis Date  . Acute hypoxemic respiratory failure ([place]) 04/09/2016  . Acute renal failure superimposed on stage 3 chronic kidney disease ([place]) 04/21/2015  . Arthritis   . ARTHRITIS 11/27/2008   Qualifier: Diagnosis of  By: Cleda Mccreedy, Davis Gourd   . Atrial fibrillation, chronic 11/27/2008   Qualifier: Diagnosis of  By: Cleda Mccreedy, Davis Gourd  Qualifier: Diagnosis of  By: Shelda Pal    . BPH (benign prostatic hyperplasia) 04/21/2015  . BPH/LUTS W/O OBSTRUCTION 11/27/2008   Qualifier: Diagnosis of  By: Cleda Mccreedy, Davis Gourd   . Bronchitis 04/21/2015  . Cerebral embolism with cerebral infarction 01/04/2017  . Chronic atrial fibrillation ([place])   . Chronic diastolic CHF (congestive heart failure) ([place]) 02/25/2019  . CKD (chronic kidney disease), stage III 02/25/2019  . COPD (chronic obstructive pulmonary disease) with chronic bronchitis ([place]) 03/12/2016  . Dehydration 10/02/2017  . Dementia without behavioral disturbance ([place])   . Diabetes mellitus  without complication ([place])   . Dyspnea 12/28/2012  . Elevated lactic acid level 01/28/2016  . Elevated troponin 02/26/2016  . Frequent falls 01/01/2017  . Gastroenteritis 10/06/2017  . Generalized weakness 01/01/2017  . GERD 11/27/2008   Qualifier: Diagnosis of  By: Cleda Mccreedy, Davis Gourd   . HCAP (healthcare-associated pneumonia) 02/26/2016  . Hyperglycemia 04/21/2015  . Hyperlipidemia   . Hypertension   . HYPERTENSION, BENIGN 11/27/2008   Qualifier: Diagnosis of  By: Cleda Mccreedy, Davis Gourd   . Hypokalemia 10/02/2017  . OTHER DYSPNEA AND RESPIRATORY ABNORMALITIES 05/27/2010   Qualifier: Diagnosis of  By: Owens Shark, RN, BSN, Lauren    . Pulmonary embolism ([place]) 09/28/2019  . Sepsis ([place]) 01/28/2016  . Sleep apnea    no cpap used, sleep study was several yrs ago  . Syncope 10/02/2017  . Tachycardia 04/21/2015  . Urinary retention foley last changed 3 weeks ago    Patient Active Problem List   Diagnosis Date Noted  . Pancreatic mass 01/25/2020  . Obstructive cholestatic liver disease   . Pulmonary embolism ([place]) 09/28/2019  . CKD (chronic kidney disease), stage III 02/25/2019  . Chronic diastolic CHF (congestive heart failure) ([place]) 02/25/2019  . Gastroenteritis 10/06/2017  . Hypokalemia 10/02/2017  . Dehydration 10/02/2017  . Syncope 10/02/2017  . Cerebral embolism with cerebral infarction 01/04/2017  . Dementia without behavioral disturbance ([place])   . Falls 01/03/2017  . Generalized weakness 01/01/2017  . Frequent falls 01/01/2017  . Acute hypoxemic respiratory  failure ([place]) 04/09/2016  . COPD (chronic obstructive pulmonary disease) with chronic bronchitis ([place]) 03/12/2016  . HCAP (healthcare-associated pneumonia) 02/26/2016  . Elevated troponin 02/26/2016  . Elevated lactic acid level 01/28/2016  . Sepsis ([place]) 01/28/2016  . Acute kidney injury ([place]) 04/21/2015  . Hyperglycemia 04/21/2015  . Bronchitis 04/21/2015  . Tachycardia 04/21/2015  . BPH (benign prostatic hyperplasia)  04/21/2015  . Dyspnea 12/28/2012  . Fall 12/28/2012  . OTHER DYSPNEA AND RESPIRATORY ABNORMALITIES 05/27/2010  . Hyperlipidemia 11/27/2008  . HYPERTENSION, BENIGN 11/27/2008  . Atrial fibrillation, chronic 11/27/2008  . GERD 11/27/2008  . BPH/LUTS W/O OBSTRUCTION 11/27/2008  . ARTHRITIS 11/27/2008    Past Surgical History:  Procedure Laterality Date  . BACK SURGERY  yrs ago   lower back surgery  . PROSTATE SURGERY  yrs ago, no cancer  . TONSILLECTOMY  as child  . TRANSURETHRAL RESECTION OF PROSTATE  02/22/2012   Procedure: TRANSURETHRAL RESECTION OF THE PROSTATE WITH GYRUS INSTRUMENTS;  Surgeon: Molli Hazard, MD;  Location: [facility]S;  Service: Urology;  Laterality: N/A;           Family History  Problem Relation Age of Onset  . Heart attack Father     Social History   Tobacco Use  . Smoking status: Former Smoker    Packs/day: 0.50    Years: 50.00    Pack years: 25.00    Types: Cigarettes    Quit date: 12/21/1980    Years since quitting: 39.1  . Smokeless tobacco: Never Used  Substance Use Topics  . Alcohol use: Yes    Alcohol/week: 0.0 standard drinks    Comment: rare use  . Drug use: No    Home Medications Prior to Admission medications   Medication Sig Start Date End Date Taking? Authorizing Provider  atorvastatin (LIPITOR) 20 MG tablet Take 1 tablet (20 mg total) by mouth daily at 6 PM. 01/04/17  Yes Florencia Reasons, MD  donepezil (ARICEPT) 10 MG tablet Take 10 mg by mouth at bedtime.  01/09/16  Yes [provider]  finasteride (PROSCAR) 5 MG tablet Take 5 mg by mouth at bedtime.    Yes [provider]  furosemide (LASIX) 20 MG tablet Take 20 mg by mouth every morning. 12/04/19  Yes [provider]  glimepiride (AMARYL) 2 MG tablet Take 2 mg by mouth daily with breakfast.  12/16/18  Yes [provider]  Melatonin 10 MG TABS Take 10 mg by mouth at bedtime.   Yes [provider]  metoprolol succinate (TOPROL-XL) 50 MG  24 hr tablet Take 1 tablet (50 mg total) by mouth at bedtime. Patient is overdue for an appointment and needs to schedule for further refills 2nd attempt 10/01/17  Yes Sherren Mocha, MD  mupirocin ointment (BACTROBAN) 2 % Apply 1 application topically 2 (two) times daily. To foot ulcer 12/20/19  Yes [provider]  rivaroxaban (XARELTO) 20 MG TABS tablet Take 1 tablet (20 mg total) by mouth daily with supper. 10/12/19  Yes Mikhail, Maryann, DO  insulin aspart (NOVOLOG) 100 UNIT/ML injection Inject 0-9 Units into the skin 3 (three) times daily with meals. CBG 121 - 150: 1 unit,   CBG 151 - 200: 2 units,   CBG 201 - 250: 3 units,   CBG 251 - 300: 5 units,   CBG 301 - 350: 7 units,   CBG 351 - 400: 9 units Patient not taking: Reported on 01/25/2020 10/06/19   Cristal Ford, DO  insulin glargine (LANTUS) 100  UNIT/ML injection Inject 0.2 mLs (20 Units total) into the skin daily. Patient not taking: Reported on 01/25/2020 10/07/19   Cristal Ford, DO  Rivaroxaban (XARELTO) 15 MG TABS tablet Take 1 tablet (15 mg total) by mouth 2 (two) times daily for 5 days. Patient not taking: Reported on 01/25/2020 10/06/19 01/25/20  Cristal Ford, DO    Allergies    Sulfa antibiotics  Review of Systems   Review of Systems  Unable to perform ROS: Mental status change    Physical Exam Updated Vital Signs BP 96/64   Pulse 99   Temp 98 F (36.7 C) (Rectal)   Resp 13   Ht 5\' 8"  (1.727 m)   Wt 101.2 kg   SpO2 95%   BMI 33.91 kg/m   Physical Exam Vitals and nursing note reviewed.  Constitutional:      Appearance: He is well-developed.  HENT:     Head: Normocephalic and atraumatic.     Right Ear: External ear normal.     Left Ear: External ear normal.     Nose: Nose normal.  Eyes:     General:        Right eye: No discharge.        Left eye: No discharge.  Cardiovascular:     Rate and Rhythm: Regular rhythm. Tachycardia present.     Heart sounds: Normal heart sounds.  Pulmonary:      Effort: Pulmonary effort is normal.     Breath sounds: Normal breath sounds.  Abdominal:     Palpations: Abdomen is soft.     Tenderness: There is no abdominal tenderness.  Musculoskeletal:     Cervical back: Neck supple.  Skin:    General: Skin is warm and dry.     Coloration: Skin is jaundiced.  Neurological:     Mental Status: He is alert. He is disoriented.     Comments: Awake, alert, knows name and that he's in hospital. Disoriented to day of week, month, year. No facial asymmetry or slurred speech. 5/5 strength in both upper extremities. 4/5 strength in both lower extremities.  Psychiatric:        Mood and Affect: Mood is not anxious.     ED Results / Procedures / Treatments   Labs (all labs ordered are listed, but only abnormal results are displayed) Labs Reviewed  COMPREHENSIVE METABOLIC PANEL - Abnormal; Notable for the following components:      Result Value   Sodium 131 (*)    Potassium 3.0 (*)    Chloride 91 (*)    Glucose, Bld 210 (*)    BUN 43 (*)    Creatinine, Ser 3.07 (*)    Albumin 2.8 (*)    AST 973 (*)    ALT 606 (*)    Alkaline Phosphatase 1,607 (*)    Total Bilirubin 12.6 (*)    GFR calc non Af Amer 17 (*)    GFR calc Af Amer 19 (*)    Anion gap 17 (*)    All other components within normal limits  LACTIC ACID, PLASMA - Abnormal; Notable for the following components:   Lactic Acid, Venous 2.1 (*)    All other components within normal limits  CBC WITH DIFFERENTIAL/PLATELET - Abnormal; Notable for the following components:   RBC 4.08 (*)    HCT 38.6 (*)    Neutro Abs 9.0 (*)    Lymphs Abs 0.5 (*)    All other components within normal limits  URINALYSIS, ROUTINE W  REFLEX MICROSCOPIC - Abnormal; Notable for the following components:   Color, Urine AMBER (*)    APPearance HAZY (*)    Hgb urine dipstick LARGE (*)    Protein, ur 30 (*)    Leukocytes,Ua TRACE (*)    RBC / HPF >50 (*)    Bacteria, UA FEW (*)    All other components within  normal limits  AMMONIA - Abnormal; Notable for the following components:   Ammonia 44 (*)    All other components within normal limits  CBG MONITORING, ED - Abnormal; Notable for the following components:   Glucose-Capillary 175 (*)    All other components within normal limits  I-STAT CHEM 8, ED - Abnormal; Notable for the following components:   Sodium 133 (*)    Potassium 3.0 (*)    Chloride 94 (*)    BUN 40 (*)    Creatinine, Ser 2.90 (*)    Glucose, Bld 200 (*)    Calcium, Ion 1.08 (*)    All other components within normal limits  RESPIRATORY PANEL BY RT PCR (FLU A&B, COVID)  URINE CULTURE  CULTURE, BLOOD (ROUTINE X 2)  CULTURE, BLOOD (ROUTINE X 2)  ETHANOL  LACTIC ACID, PLASMA  LIPASE, BLOOD  POC SARS CORONAVIRUS 2 AG -  ED    EKG EKG Interpretation  Date/Time:  Thursday January 25 2020 10:21:27 EST Ventricular Rate:  97 PR Interval:    QRS Duration: 105 QT Interval:  431 QTC Calculation: 548 R Axis:   52 Text Interpretation: Atrial fibrillation Low voltage, precordial leads Nonspecific T abnormalities, anterior leads Prolonged QT interval similar to Oct 2020 Confirmed by Sherwood Gambler [phone]) on 01/25/2020 10:28:25 AM   Radiology CT ABDOMEN PELVIS WO CONTRAST  Result Date: 01/25/2020 CLINICAL DATA:  Jaundice EXAM: CT ABDOMEN AND PELVIS WITHOUT CONTRAST TECHNIQUE: Multidetector CT imaging of the abdomen and pelvis was performed following the standard protocol without IV contrast. COMPARISON:  CT angiography of the chest dated 09/27/2019 and CT of the abdomen and pelvis from 2005 FINDINGS: Lower chest: No signs of consolidation or evidence of pleural effusion. No pericardial fluid. Hepatobiliary: Massive biliary ductal distension. Masslike appearance of pancreatic head may measure as large as 4.4 x 4.8 cm, associated with ductal dilation of the pancreas. Soft tissue abuts the splenic portal venous system at the level of the root of the mesentery. Stranding extends  towards the SMA. Not well assessed without contrast. Cholelithiasis. Pancreas: As above with mass in the pancreatic head and biliary and pancreatic ductal dilation. Spleen: Normal spleen. Adrenals/Urinary Tract: Normal adrenal glands. Renal cortical scarring. Cysts of varying complexity bilaterally. Renal vascular calcification. No hydronephrosis or ureteral calculus. Stomach/Bowel: No signs of acute gastrointestinal process. Colonic diverticulosis. Vascular/Lymphatic: Calcified atherosclerotic plaque throughout the abdominal aorta. No aneurysm. No adenopathy in the upper abdomen. No signs of pelvic lymphadenopathy. Reproductive: Prostate with calcification and heterogeneity likely with TURP. Other: No abdominal wall hernia or abnormality. No abdominopelvic ascites. Musculoskeletal: Spinal degenerative changes and signs of lumbosacral spinal fusion. No signs of acute bone finding or destructive bone process. IMPRESSION: 1. Mass in the pancreatic head may measure as large as 4.4 x 4.8 cm, associated with marked biliary and pancreatic ductal dilation. Soft tissue abuts the splenic portal venous system at the level of the root of the mesentery. Findings of what is likely a pancreatic neoplasm with biliary pancreatic ductal obstruction. Contrasted imaging or endoscopic assessment may be helpful. 2. Cholelithiasis. 3. Renal cortical scarring with multiple cysts of varying  complexity bilaterally. No hydronephrosis or ureteral calculus. 4. Colonic diverticulosis. Aortic Atherosclerosis (ICD10-I70.0). Electronically Signed   By: Zetta Bills M.D.   On: 01/25/2020 14:49   CT Head Wo Contrast  Result Date: 01/25/2020 CLINICAL DATA:  Altered level of consciousness, weakness, confusion EXAM: CT HEAD WITHOUT CONTRAST TECHNIQUE: Contiguous axial images were obtained from the base of the skull through the vertex without intravenous contrast. COMPARISON:  09/27/2019 FINDINGS: Brain: Chronic confluent hypodensities throughout  the periventricular and subcortical white matter consistent with chronic small vessel ischemic changes. No signs of acute infarct or hemorrhage. Ex vacuo dilatation of lateral ventricles again noted and stable. Remaining midline structures are unremarkable. No acute extra-axial fluid collections. No mass effect. Vascular: No hyperdense vessel or unexpected calcification. Skull: Normal. Negative for fracture or focal lesion. Sinuses/Orbits: No acute finding. Other: None IMPRESSION: 1. Chronic ischemic changes as above.  No acute process. Electronically Signed   By: Randa Ngo M.D.   On: 01/25/2020 11:09   DG Chest Portable 1 View  Result Date: 01/25/2020 CLINICAL DATA:  Altered mental status. EXAM: PORTABLE CHEST 1 VIEW COMPARISON:  September 27, 2019. FINDINGS: The heart size and mediastinal contours are within normal limits. Both lungs are clear. The visualized skeletal structures are unremarkable. IMPRESSION: No active disease. Aortic Atherosclerosis (ICD10-I70.0). Electronically Signed   By: Marijo Conception M.D.   On: 01/25/2020 10:15    Procedures .Critical Care Performed by: Sherwood Gambler, MD Authorized by: Sherwood Gambler, MD   Critical care provider statement:    Critical care time (minutes):  40   Critical care time was exclusive of:  Separately billable procedures and treating other patients   Critical care was necessary to treat or prevent imminent or life-threatening deterioration of the following conditions:  Renal failure and hepatic failure   Critical care was time spent personally by me on the following activities:  Discussions with consultants, evaluation of patient's response to treatment, examination of patient, ordering and performing treatments and interventions, ordering and review of laboratory studies, ordering and review of radiographic studies, pulse oximetry, re-evaluation of patient's condition, obtaining history from patient or surrogate and review of old charts    (including critical care time)  Medications Ordered in ED Medications  lactated ringers infusion ( Intravenous New Bag/Given 01/25/20 1304)  heparin injection 5,000 Units (5,000 Units Subcutaneous Given 01/25/20 1602)  finasteride (PROSCAR) tablet 5 mg (has no administration in time range)  sodium chloride 0.9 % bolus 1,000 mL (0 mLs Intravenous Stopped 01/25/20 1303)  potassium chloride 10 mEq in 100 mL IVPB (0 mEq Intravenous Stopped 01/25/20 1403)    ED Course  I have reviewed the triage vital signs and the nursing notes.  Pertinent labs & imaging results that were available during my care of the patient were reviewed by me and considered in my medical decision making (see chart for details).    MDM Rules/Calculators/A&P                       Unfortunately patient has AKI and liver disease.  Appears to be from new onset pancreatic mass, likely cancer.  This appears to have some obstructive pathology.  No obvious sepsis.  I think his lactate is from dehydration rather than infection.  Discussed with Dr. Zigmund Daniel for admission.  I tried to call the son back for update and he did not answer.  I have also paged gastroenterology multiple times but have not received a response but query whether  they can help decompress his obstruction.  [name] [name] was evaluated in Emergency Department on 01/25/2020 for the symptoms described in the history of present illness. He was evaluated in the context of the global COVID-19 pandemic, which necessitated consideration that the patient might be at risk for infection with the SARS-CoV-2 virus that causes COVID-19. Institutional protocols and algorithms that pertain to the evaluation of patients at risk for COVID-19 are in a state of rapid change based on information released by regulatory bodies including the CDC and federal and state organizations. These policies and algorithms were followed during the patient's care in the ED.  Final Clinical Impression(s) / ED  Diagnoses Final diagnoses:  Pancreatic mass  Acute kidney injury ([place])  Obstructive cholestatic liver disease    Rx / DC Orders ED Discharge Orders    None       Sherwood Gambler, MD 01/25/20 1642

## 2020-01-25 NOTE — ED Notes (Signed)
CT advises there are still 3 people ahead of this pt for his scan.

## 2020-01-25 NOTE — Progress Notes (Signed)
Pt arrived to room 6N24 via stretcher from the ED. Received report from [place], Therapist, sports. See assessment. Will continue to monitor.

## 2020-01-26 DIAGNOSIS — Z7189 Other specified counseling: Secondary | ICD-10-CM

## 2020-01-26 DIAGNOSIS — K8689 Other specified diseases of pancreas: Secondary | ICD-10-CM

## 2020-01-26 DIAGNOSIS — Z515 Encounter for palliative care: Secondary | ICD-10-CM

## 2020-01-26 DIAGNOSIS — K831 Obstruction of bile duct: Secondary | ICD-10-CM

## 2020-01-26 LAB — CBC WITH DIFFERENTIAL/PLATELET
Abs Immature Granulocytes: 0.05 10*3/uL (ref 0.00–0.07)
Basophils Absolute: 0 10*3/uL (ref 0.0–0.1)
Basophils Relative: 1 %
Eosinophils Absolute: 0.2 10*3/uL (ref 0.0–0.5)
Eosinophils Relative: 3 %
HCT: 32.2 % — ABNORMAL LOW (ref 39.0–52.0)
Hemoglobin: 11 g/dL — ABNORMAL LOW (ref 13.0–17.0)
Immature Granulocytes: 1 %
Lymphocytes Relative: 9 %
Lymphs Abs: 0.7 10*3/uL (ref 0.7–4.0)
MCH: 32 pg (ref 26.0–34.0)
MCHC: 34.2 g/dL (ref 30.0–36.0)
MCV: 93.6 fL (ref 80.0–100.0)
Monocytes Absolute: 0.5 10*3/uL (ref 0.1–1.0)
Monocytes Relative: 6 %
Neutro Abs: 6.6 10*3/uL (ref 1.7–7.7)
Neutrophils Relative %: 80 %
Platelets: 155 10*3/uL (ref 150–400)
RBC: 3.44 MIL/uL — ABNORMAL LOW (ref 4.22–5.81)
RDW: 14.9 % (ref 11.5–15.5)
WBC: 8.1 10*3/uL (ref 4.0–10.5)
nRBC: 0 % (ref 0.0–0.2)

## 2020-01-26 LAB — COMPREHENSIVE METABOLIC PANEL
ALT: 501 U/L — ABNORMAL HIGH (ref 0–44)
AST: 791 U/L — ABNORMAL HIGH (ref 15–41)
Albumin: 2.3 g/dL — ABNORMAL LOW (ref 3.5–5.0)
Alkaline Phosphatase: 1332 U/L — ABNORMAL HIGH (ref 38–126)
Anion gap: 14 (ref 5–15)
BUN: 41 mg/dL — ABNORMAL HIGH (ref 8–23)
CO2: 24 mmol/L (ref 22–32)
Calcium: 8.4 mg/dL — ABNORMAL LOW (ref 8.9–10.3)
Chloride: 94 mmol/L — ABNORMAL LOW (ref 98–111)
Creatinine, Ser: 2.66 mg/dL — ABNORMAL HIGH (ref 0.61–1.24)
GFR calc Af Amer: 23 mL/min — ABNORMAL LOW (ref >60)
GFR calc non Af Amer: 20 mL/min — ABNORMAL LOW (ref >60)
Glucose, Bld: 309 mg/dL — ABNORMAL HIGH (ref 70–99)
Potassium: 2.6 mmol/L — CL (ref 3.5–5.1)
Sodium: 132 mmol/L — ABNORMAL LOW (ref 135–145)
Total Bilirubin: 10.9 mg/dL — ABNORMAL HIGH (ref 0.3–1.2)
Total Protein: 5.6 g/dL — ABNORMAL LOW (ref 6.5–8.1)

## 2020-01-26 LAB — URINE CULTURE: Culture: NO GROWTH

## 2020-01-26 LAB — MAGNESIUM: Magnesium: 2 mg/dL (ref 1.7–2.4)

## 2020-01-26 MED ORDER — HYDROMORPHONE HCL 1 MG/ML IJ SOLN: 0.5000 mg | INTRAMUSCULAR | Status: DC | PRN

## 2020-01-26 MED ORDER — POTASSIUM CHLORIDE 10 MEQ/100ML IV SOLN
10.0000 meq | INTRAVENOUS | Status: AC
  Administered 2020-01-26 (×3): 10 meq via INTRAVENOUS
  Filled 2020-01-26 (×3): qty 100

## 2020-01-26 MED ORDER — POTASSIUM CHLORIDE 10 MEQ/100ML IV SOLN
10.0000 meq | INTRAVENOUS | Status: AC
  Administered 2020-01-26 (×2): 10 meq via INTRAVENOUS
  Filled 2020-01-26 (×2): qty 100

## 2020-01-26 MED ORDER — OXYCODONE HCL 5 MG PO TABS
2.5000 mg | ORAL_TABLET | ORAL | Status: DC | PRN
  Administered 2020-01-26: 5 mg via ORAL
  Administered 2020-01-27 (×2): 2.5 mg via ORAL
  Administered 2020-01-28: 5 mg via ORAL
  Filled 2020-01-26 (×4): qty 1

## 2020-01-26 MED ORDER — POTASSIUM CHLORIDE CRYS ER 20 MEQ PO TBCR
40.0000 meq | EXTENDED_RELEASE_TABLET | ORAL | Status: AC
  Administered 2020-01-26 (×2): 40 meq via ORAL
  Filled 2020-01-26 (×2): qty 2

## 2020-01-26 MED ORDER — POTASSIUM CHLORIDE CRYS ER 20 MEQ PO TBCR
40.0000 meq | EXTENDED_RELEASE_TABLET | Freq: Once | ORAL | Status: AC
  Administered 2020-01-26: 40 meq via ORAL
  Filled 2020-01-26: qty 2

## 2020-01-26 NOTE — Progress Notes (Signed)
Inpatient Diabetes Program Recommendations  AACE/ADA: New Consensus Statement on Inpatient Glycemic Control (2015)  Target Ranges:  Prepandial:   less than 140 mg/dL      Peak postprandial:   less than 180 mg/dL (1-2 hours)      Critically ill patients:  140 - 180 mg/dL   Lab Results  Component Value Date   GLUCAP 175 (H) 01/25/2020   HGBA1C 8.9 (H) 09/28/2019    Review of Glycemic Control Results for AVRON, MAYO" (MRN [id number]) as of 01/26/2020 10:54  Ref. Range 01/25/2020 10:03  Glucose-Capillary Latest Ref Range: 70 - 99 mg/dL 175 (H)   Diabetes history: Type 2 DM Outpatient Diabetes medications: Amaryl 2 mg QAM Current orders for Inpatient glycemic control: none  Inpatient Diabetes Program Recommendations:    AM glucose serum 301 mg/dL. If appropriate consider adding Novolog 0-6 units TID & Hs.  Thanks, Bronson Curb, MSN, RNC-OB Diabetes Coordinator [phone] (8a-5p)

## 2020-01-26 NOTE — Consult Note (Signed)
[facility] Consult: 11:36 AM 01/26/2020  LOS: 1 day    Referring Provider: Dr Elna Breslow  Primary Care Physician:  Osborne Casco Fransico Him, MD Primary Gastroenterologist:  Dr. Michela Pitcher     Reason for Consultation:  Obstructing pancreatic head mass w jaundice.     HPI: [name] [name] is a 86 y.o. male.  PMH chronic atrial fibrillation.  CVA, residual left sided weakness.  OSA not on CPAP.  Bilateral, saddle PE 09/2019. Current Xarelto.  Diastolic CHF.  CKD 3.  DM2.   Cerebral atrophy and small vessel ischemia.  On Aricept for dementia.  Prostate cancer 2013.  Deg spine disease, s/p LS spine surgery Previous upper endoscopies and colonoscopy more than 12 years ago. 1999 EGD findings of small hiatal hernia, mild reflux esophagitis, mild antritis and mild duodenitis.  1993 colonoscopy.  Multiple polyps removed, sigmoid diverticulosis.  Unable to locate pathology on those polyps.  Lives at home with 24-hour care.  Caregiver noticed altered mental status on 2/4, patient did not recognize her.  He currently C/O pain and pruritus all over.  Denies nausea and vomiting or change in appetite.  Abundant purpura on his arms and legs started appearing about a month ago, after he was "tortured" by the people who "put him in here".    T bili 12.6.  Alkaline phosphatase 1607.  AST/ALT 973/606. Potassium 2.6.  AKI at 43/3.0.  Potassium 2.6.  Sodium 132. WBCs normal.  Hgb 13 >> 11 over the course of less than 24 hours. CTAP without contrast shows 4.4 x 4.8 cm mass at the head of pancreas with associated biliary and pancreatic duct dilatation.  This abuts splenic portal venous system at the root of the mesentery.  Also cholelithiasis.  Renal scarring and cysts without hydronephrosis.  Colon diverticulosis.      Past Medical  History:  Diagnosis Date   Acute hypoxemic respiratory failure ([place]) 04/09/2016   Acute renal failure superimposed on stage 3 chronic kidney disease ([place]) 04/21/2015   Arthritis    ARTHRITIS 11/27/2008   Qualifier: Diagnosis of  By: Jule Ser    Atrial fibrillation, chronic 11/27/2008   Qualifier: Diagnosis of  By: Jule Ser  Qualifier: Diagnosis of  By: Shelda Pal     BPH (benign prostatic hyperplasia) 04/21/2015   BPH/LUTS W/O OBSTRUCTION 11/27/2008   Qualifier: Diagnosis of  By: Cleda Mccreedy, Davis Gourd    Bronchitis 04/21/2015   Cerebral embolism with cerebral infarction 01/04/2017   Chronic atrial fibrillation (HCC)    Chronic diastolic CHF (congestive heart failure) ([place]) 02/25/2019   CKD (chronic kidney disease), stage III 02/25/2019   COPD (chronic obstructive pulmonary disease) with chronic bronchitis ([place]) 03/12/2016   Dehydration 10/02/2017   Dementia without behavioral disturbance ([place])    Diabetes mellitus without complication ([place])    Dyspnea 12/28/2012   Elevated lactic acid level 01/28/2016   Elevated troponin 02/26/2016   Frequent falls 01/01/2017   Gastroenteritis 10/06/2017   Generalized weakness 01/01/2017   GERD 11/27/2008   Qualifier: Diagnosis of  By: Jule Ser    HCAP (healthcare-associated pneumonia) 02/26/2016   Hyperglycemia 04/21/2015   Hyperlipidemia    Hypertension    HYPERTENSION, BENIGN 11/27/2008   Qualifier: Diagnosis of  By: Cleda Mccreedy, Davis Gourd    Hypokalemia 10/02/2017   OTHER DYSPNEA AND RESPIRATORY ABNORMALITIES 05/27/2010   Qualifier: Diagnosis of  By: Owens Shark, RN, BSN, Lauren     Pulmonary embolism ([place]) 09/28/2019   Sepsis ([place]) 01/28/2016   Sleep apnea    no cpap used, sleep study was several yrs ago   Syncope 10/02/2017   Tachycardia 04/21/2015   Urinary retention foley last changed 3 weeks ago    Past Surgical History:  Procedure Laterality Date   BACK  SURGERY  yrs ago   lower back surgery   PROSTATE SURGERY  yrs ago, no cancer   TONSILLECTOMY  as child   TRANSURETHRAL RESECTION OF PROSTATE  02/22/2012   Procedure: TRANSURETHRAL RESECTION OF THE PROSTATE WITH GYRUS INSTRUMENTS;  Surgeon: Molli Hazard, MD;  Location: [facility];  Service: Urology;  Laterality: N/A;        Prior to Admission medications   Medication Sig Start Date End Date Taking? Authorizing Provider  atorvastatin (LIPITOR) 20 MG tablet Take 1 tablet (20 mg total) by mouth daily at 6 PM. 01/04/17  Yes Florencia Reasons, MD  donepezil (ARICEPT) 10 MG tablet Take 10 mg by mouth at bedtime.  01/09/16  Yes [provider]  finasteride (PROSCAR) 5 MG tablet Take 5 mg by mouth at bedtime.    Yes [provider]  furosemide (LASIX) 20 MG tablet Take 20 mg by mouth every morning. 12/04/19  Yes [provider]  glimepiride (AMARYL) 2 MG tablet Take 2 mg by mouth daily with breakfast.  12/16/18  Yes [provider]  Melatonin 10 MG TABS Take 10 mg by mouth at bedtime.   Yes [provider]  metoprolol succinate (TOPROL-XL) 50 MG 24 hr tablet Take 1 tablet (50 mg total) by mouth at bedtime. Patient is overdue for an appointment and needs to schedule for further refills 2nd attempt 10/01/17  Yes Sherren Mocha, MD  mupirocin ointment (BACTROBAN) 2 % Apply 1 application topically 2 (two) times daily. To foot ulcer 12/20/19  Yes [provider]  rivaroxaban (XARELTO) 20 MG TABS tablet Take 1 tablet (20 mg total) by mouth daily with supper. 10/12/19  Yes Mikhail, Maryann, DO  insulin aspart (NOVOLOG) 100 UNIT/ML injection Inject 0-9 Units into the skin 3 (three) times daily with meals. CBG 121 - 150: 1 unit,   CBG 151 - 200: 2 units,   CBG 201 - 250: 3 units,   CBG 251 - 300: 5 units,   CBG 301 - 350: 7 units,   CBG 351 - 400: 9 units Patient not taking: Reported on 01/25/2020 10/06/19   Cristal Ford, DO  insulin glargine (LANTUS) 100  UNIT/ML injection Inject 0.2 mLs (20 Units total) into the skin daily. Patient not taking: Reported on 01/25/2020 10/07/19   Cristal Ford, DO  Rivaroxaban (XARELTO) 15 MG TABS tablet Take 1 tablet (15 mg total) by mouth 2 (two) times daily for 5 days. Patient not taking: Reported on 01/25/2020 10/06/19 01/25/20  Cristal Ford, DO    Scheduled Meds:  finasteride  5 mg Oral QHS   heparin  5,000 Units Subcutaneous Q8H   potassium chloride  40 mEq Oral Q4H   Infusions:  lactated ringers 125 mL/hr at 01/26/20 0450   PRN Meds:  Allergies as of 01/25/2020 - Review Complete 01/25/2020  Allergen Reaction Noted   Sulfa antibiotics Swelling 01/21/2012    Family History  Problem Relation Age of Onset   Heart attack Father     Social History   Socioeconomic History   Marital status: Married    Spouse name: Not on file   Number of children: Not on file   Years of education: Not on file   Highest education level: Not on file  Occupational History   Not on file  Tobacco Use   Smoking status: Former Smoker    Packs/day: 0.50    Years: 50.00    Pack years: 25.00    Types: Cigarettes    Quit date: 12/21/1980    Years since quitting: 39.1   Smokeless tobacco: Never Used  Substance and Sexual Activity   Alcohol use: Yes    Alcohol/week: 0.0 standard drinks    Comment: rare use   Drug use: No   Sexual activity: Never  Other Topics Concern   Not on file  Social History Narrative   Not on file   Social Determinants of Health   Financial Resource Strain:    Difficulty of Paying Living Expenses: Not on file  Food Insecurity:    Worried About [place] in the Last Year: Not on file   Ran Out of Food in the Last Year: Not on file  Transportation Needs:    Lack of Transportation (Medical): Not on file   Lack of Transportation (Non-Medical): Not on file  Physical Activity:    Days of Exercise per Week: Not on file   Minutes of Exercise per  Session: Not on file  Stress:    Feeling of Stress : Not on file  Social Connections:    Frequency of Communication with Friends and Family: Not on file   Frequency of Social Gatherings with Friends and Family: Not on file   Attends Religious Services: Not on file   Active Member of Clubs or Organizations: Not on file   Attends Archivist Meetings: Not on file   Marital Status: Not on file  Intimate Partner Violence:    Fear of Current or Ex-Partner: Not on file   Emotionally Abused: Not on file   Physically Abused: Not on file   Sexually Abused: Not on file    REVIEW OF SYSTEMS: Constitutional: Does not complain of fatigue or weakness. ENT:  No nose bleeds Pulm: Some cough, denies shortness of breath. CV:  No palpitations, + LE edema.  No chest pain. GU:  No hematuria, no frequency.  Urine is darker than normal. GI: Denies heartburn, nausea, dysphagia.  Does endorse abdominal pain but this is nonfocal and is associated with generalized body pain. Heme: Denies excessive bleeding.  Says the multiple tiny purpura on his arms and some on his legs have occurred within the last month. Transfusions: Notes from 04/2011 mention blood loss anemia requiring transfusion due to RP and rectus sheath hematomas in the setting of supratherapeutic INR. Neuro:  No headaches, no peripheral tingling or numbness.  No seizures.  No recent syncope but did have an episode of syncope in 02/2019. Derm: Generalized pruritus. Endocrine:  No sweats or chills.  No polyuria or dysuria Immunization: Not queried. Travel:  None   PHYSICAL EXAM: Vital signs in last 24 hours: Vitals:   01/25/20 1710 01/26/20 0519  BP: 102/74 (!) 110/56  Pulse: 87 71  Resp: 16 15  Temp: 98.2 F (36.8  C) 97.7 F (36.5 C)  SpO2: 100% 100%   Wt Readings from Last 3 Encounters:  01/25/20 101.2 kg  01/04/20 101.2 kg  11/24/19 101.2 kg    General: Aged, somewhat frail and emotionally labile elderly  WM. Head: No facial asymmetry or swelling.  No signs of head trauma. Eyes: Icteric sclera.  Conjunctiva pink. Ears: Slight HOH. Nose: No congestion, no discharge Mouth: Tongue midline.  Oral mucosa pink, moist, clear.  Over 50% of his teeth are absent and there are some caries in the remaining teeth. Neck: No JVD, no masses, no thyromegaly Lungs: Clear bilaterally.  Occasional brief wet cough.  No labored breathing Heart: Irregularly irregular, rate controlled.  Distant heart sounds.  Do not appreciate MRG.  S1, S2 audible. Abdomen: Soft.  Without masses or organomegaly.  No bruits, no hernias.  No tenderness..   Rectal: Deferred Musc/Skeltl: Arthritic changes in the fingers.  No joint swelling. Extremities: Slight, barely pitting pedal edema left >> right. Neurologic: Able to tell me his birthday.  Not oriented to year or place but knows that he is in [place] and that he lives in [facility].  However he insists that he lives with his mother. Skin: Innumerable subcentimeter punctate purpura most dense in the arms, less so in the lower legs and even less so across the upper chest.  These are associated with a few scabbed over lesions. Tattoos: None observed Nodes: No cervical adenopathy. Psych: Follows commands.  No tremors.  Grossly full strength on all four limbs with the exception of the left leg which is weak and which he has trouble plantar flexing.    Intake/Output from previous day: 02/04 0701 - 02/05 0700 In: 3511.6 [P.O.:630; I.V.:1683.2; IV Piggyback:1198.5] Out: 600 [Urine:600] Intake/Output this shift: Total I/O In: 120 [P.O.:120] Out: -   LAB RESULTS: Recent Labs    01/25/20 1033 01/25/20 1152 01/26/20 0132  WBC 10.4  --  8.1  HGB 13.1 14.6 11.0*  HCT 38.6* 43.0 32.2*  PLT 181  --  155   BMET Lab Results  Component Value Date   NA 132 (L) 01/26/2020   NA 133 (L) 01/25/2020   NA 131 (L) 01/25/2020   K 2.6 (LL) 01/26/2020   K 3.0 (L) 01/25/2020   K 3.0 (L)  01/25/2020   CL 94 (L) 01/26/2020   CL 94 (L) 01/25/2020   CL 91 (L) 01/25/2020   CO2 24 01/26/2020   CO2 23 01/25/2020   CO2 23 10/04/2019   GLUCOSE 309 (H) 01/26/2020   GLUCOSE 200 (H) 01/25/2020   GLUCOSE 210 (H) 01/25/2020   BUN 41 (H) 01/26/2020   BUN 40 (H) 01/25/2020   BUN 43 (H) 01/25/2020   CREATININE 2.66 (H) 01/26/2020   CREATININE 2.90 (H) 01/25/2020   CREATININE 3.07 (H) 01/25/2020   CALCIUM 8.4 (L) 01/26/2020   CALCIUM 9.1 01/25/2020   CALCIUM 9.3 10/04/2019   LFT Recent Labs    01/25/20 1033 01/26/20 0132  PROT 6.9 5.6*  ALBUMIN 2.8* 2.3*  AST 973* 791*  ALT 606* 501*  ALKPHOS 1,607* 1,332*  BILITOT 12.6* 10.9*   PT/INR Lab Results  Component Value Date   INR 1.21 10/02/2017   INR 1.23 01/28/2016   INR 1.02 05/25/2013   Hepatitis Panel No results for input(s): HEPBSAG, HCVAB, HEPAIGM, HEPBIGM in the last 72 hours. C-Diff No components found for: CDIFF Lipase     Component Value Date/Time   LIPASE 43 01/25/2020 1202    Drugs of  Abuse  No results found for: LABOPIA, COCAINSCRNUR, LABBENZ, AMPHETMU, THCU, LABBARB   RADIOLOGY STUDIES: CT ABDOMEN PELVIS WO CONTRAST  Result Date: 01/25/2020 CLINICAL DATA:  Jaundice EXAM: CT ABDOMEN AND PELVIS WITHOUT CONTRAST TECHNIQUE: Multidetector CT imaging of the abdomen and pelvis was performed following the standard protocol without IV contrast. COMPARISON:  CT angiography of the chest dated 09/27/2019 and CT of the abdomen and pelvis from 2005 FINDINGS: Lower chest: No signs of consolidation or evidence of pleural effusion. No pericardial fluid. Hepatobiliary: Massive biliary ductal distension. Masslike appearance of pancreatic head may measure as large as 4.4 x 4.8 cm, associated with ductal dilation of the pancreas. Soft tissue abuts the splenic portal venous system at the level of the root of the mesentery. Stranding extends towards the SMA. Not well assessed without contrast. Cholelithiasis. Pancreas: As  above with mass in the pancreatic head and biliary and pancreatic ductal dilation. Spleen: Normal spleen. Adrenals/Urinary Tract: Normal adrenal glands. Renal cortical scarring. Cysts of varying complexity bilaterally. Renal vascular calcification. No hydronephrosis or ureteral calculus. Stomach/Bowel: No signs of acute gastrointestinal process. Colonic diverticulosis. Vascular/Lymphatic: Calcified atherosclerotic plaque throughout the abdominal aorta. No aneurysm. No adenopathy in the upper abdomen. No signs of pelvic lymphadenopathy. Reproductive: Prostate with calcification and heterogeneity likely with TURP. Other: No abdominal wall hernia or abnormality. No abdominopelvic ascites. Musculoskeletal: Spinal degenerative changes and signs of lumbosacral spinal fusion. No signs of acute bone finding or destructive bone process. IMPRESSION: 1. Mass in the pancreatic head may measure as large as 4.4 x 4.8 cm, associated with marked biliary and pancreatic ductal dilation. Soft tissue abuts the splenic portal venous system at the level of the root of the mesentery. Findings of what is likely a pancreatic neoplasm with biliary pancreatic ductal obstruction. Contrasted imaging or endoscopic assessment may be helpful. 2. Cholelithiasis. 3. Renal cortical scarring with multiple cysts of varying complexity bilaterally. No hydronephrosis or ureteral calculus. 4. Colonic diverticulosis. Aortic Atherosclerosis (ICD10-I70.0). Electronically Signed   By: Zetta Bills M.D.   On: 01/25/2020 14:49   CT Head Wo Contrast  Result Date: 01/25/2020 CLINICAL DATA:  Altered level of consciousness, weakness, confusion EXAM: CT HEAD WITHOUT CONTRAST TECHNIQUE: Contiguous axial images were obtained from the base of the skull through the vertex without intravenous contrast. COMPARISON:  09/27/2019 FINDINGS: Brain: Chronic confluent hypodensities throughout the periventricular and subcortical white matter consistent with chronic small  vessel ischemic changes. No signs of acute infarct or hemorrhage. Ex vacuo dilatation of lateral ventricles again noted and stable. Remaining midline structures are unremarkable. No acute extra-axial fluid collections. No mass effect. Vascular: No hyperdense vessel or unexpected calcification. Skull: Normal. Negative for fracture or focal lesion. Sinuses/Orbits: No acute finding. Other: None IMPRESSION: 1. Chronic ischemic changes as above.  No acute process. Electronically Signed   By: Randa Ngo M.D.   On: 01/25/2020 11:09   DG Chest Portable 1 View  Result Date: 01/25/2020 CLINICAL DATA:  Altered mental status. EXAM: PORTABLE CHEST 1 VIEW COMPARISON:  September 27, 2019. FINDINGS: The heart size and mediastinal contours are within normal limits. Both lungs are clear. The visualized skeletal structures are unremarkable. IMPRESSION: No active disease. Aortic Atherosclerosis (ICD10-I70.0). Electronically Signed   By: Marijo Conception M.D.   On: 01/25/2020 10:15      IMPRESSION:   *   Pancreatic mass causing biliary and pancreatic duct obstruction, jaundice.  Likely pancreatic cancer.    *   Chronic Xarelto.  History of A. Fib with remote  Coumadin.  Developed bilateral saddle PE in 09/2019, started on Xarelto then.  Last dose 2/3 in PM per pharmacy review and now non hold.  q 8 hour SQ Heparin in place  *    Dementia.  *    AKI on top of CKD. Hypokalemia.  Hyponatremia. Current IV fluids of LR at 125 mL/minute.    PLAN:     *   Hold off on planning ERCP, stent placement until patient and his son have spoken with palliative care. Continue solid diet which is currently 1.2 mL restricted renal diet.  You may want to consider liberalizing his fluid intake in the setting of AKI.  *   Given patient is DNR, does he need to stay on telemetry?    Azucena Freed  01/26/2020, 11:36 AM Phone [phone]

## 2020-01-26 NOTE — Consult Note (Signed)
Consultation Note Date: 01/26/2020   Patient Name: [name] [name]  DOB: 11/19/1926  MRN: [id number]  Age / Sex: 86 y.o., male  PCP: Tisovec, Fransico Him, MD Referring Physician: Georgette Shell, MD  Reason for Consultation: Establishing goals of care  HPI/Patient Profile:  Per intake H&P --> [name] [name] is a 86 y.o. male with medical history significant of ckd stage 3 chronic afib cva diastolic chf admitted with generalized weakness and change in mental status.  Patient lives at home with 24-hour care.  When his caregiver came in today she noticed him acting differently did not recognize her history is obtained from the ER records patient's son and some from the patient.  When I saw him he was awake oriented to hospital.  He told me he is decreased his p.o. intake for the last few weeks and has lost some weight denies nausea vomiting diarrhea abdominal pain or urinary complaints.  Denies chest pain shortness of breath or cough. Denies fever or chills Decreased appetite for past few weeks with unknown weight loss Patient reports pruritus with yellowish discoloration of the skin.  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   Clinical Assessment and Goals of Care: I have reviewed medical records including EPIC notes, labs and imaging, received report from bedside RN, assessed the patient. Patient is jolly upon assessment. He makes multiple jokes. Noted to have scleral jaundice. Winces in pain when abdominal assessment is complete.   I met with [name] [name] (patients son), and [name] [name] (daughter via telephone) to further discuss diagnosis prognosis, [place], EOL wishes, disposition and options.   I introduced Palliative Medicine as specialized medical care for people living with serious illness. It focuses on providing relief from the symptoms and stress of a serious illness. The  goal is to improve quality of life for both the patient and the family.  We discussed the findings on CT scan. Explained that the ERCP with EUS procedure would possibly cause symptom relief. Family concerned about the risks of anesthesia, validated their concerns as being reasonable. We talked about what could possibly be solved through doing the procedure versus not. We discussed how poorly [name] [name] has been doing recently.  [name] [name] lives with his son [name] [name]. [name] [name] provided 80 hours a week of CNA assistance for him. He is reliant on help for bADLs. He was born at [place] and moved to [facility] as a child for his parents jobs. 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 He is a widower and has two children who he shares with his deceased wife. He enjoys being home. He endorses that as of recently he has had little appetite.  I asked Larin if he understands what is going on. He said that he is "not afraid to die." I agreed that he is ill at the present time. I asked him what his wishes are presently though he deferred to what his children decide. I told him that I'd very much like to  better understand his goals. He said that he is ready to meet god and does not care where he is.   We talked about code status, patient is DNR/DNI, no extraordinary measures to prolong life.   Discussed with patient the importance of continued conversation with family and their  medical providers regarding overall plan of care and treatment options, ensuring decisions are within the context of the patients values and GOCs.   All questions answered.  Plan to meet at [place]   DNR, no extraordinary life prolonging measures  Family still thinking about ERCP w/ EUS they said that they are likely not going to pursue it but would like one night to think about it  Family interested in [facility] home versus taking [name] [name] home on hospice given that they already have  caregivers in place  [place] consult placed  Plan to meet at 1400 tomorrow for further discussions  Code Status/Advance Care Planning:  DNR  Symptom Management:  Pain, d/t pancreatic head mass:  - Dilaudid 0.'5mg'$  IVP Q4H PRN Breakthrough pain  - Oxycodone 2.5-'5mg'$  PO Q4H PRN moderate pain  Palliative Prophylaxis:   Aspiration, Bowel Regimen, Delirium Protocol, Eye Care, Frequent Pain Assessment, Oral Care, Palliative Wound Care and Turn Reposition  Additional Recommendations (Limitations, Scope, Preferences):  Minimize Medications  Psycho-social/Spiritual:   Desire for further Chaplaincy support: Yes  Additional Recommendations: Caregiving  Support/Resources and Education on Hospice  Prognosis:   < 2 weeks, in the setting of little to no PO intake  Discharge Planning:  Unclear presently, patients son and daughter are deciding on home with hospice versus hospice home.      Primary Diagnoses: Present on Admission: . Pancreatic mass  I have reviewed the medical record, interviewed the patient and family, and examined the patient. The following aspects are pertinent.  Past Medical History:  Diagnosis Date  . Acute hypoxemic respiratory failure ([place]) 04/09/2016  . Acute renal failure superimposed on stage 3 chronic kidney disease ([place]) 04/21/2015  . Arthritis   . ARTHRITIS 11/27/2008   Qualifier: Diagnosis of  By: Cleda Mccreedy, Davis Gourd   . Atrial fibrillation, chronic 11/27/2008   Qualifier: Diagnosis of  By: Cleda Mccreedy, Davis Gourd  Qualifier: Diagnosis of  By: Shelda Pal    . BPH (benign prostatic hyperplasia) 04/21/2015  . BPH/LUTS W/O OBSTRUCTION 11/27/2008   Qualifier: Diagnosis of  By: Cleda Mccreedy, Davis Gourd   . Bronchitis 04/21/2015  . Cerebral embolism with cerebral infarction 01/04/2017  . Chronic atrial fibrillation ([place])   . Chronic diastolic CHF (congestive heart failure) ([place]) 02/25/2019  . CKD (chronic kidney disease), stage III 02/25/2019    . COPD (chronic obstructive pulmonary disease) with chronic bronchitis ([place]) 03/12/2016  . Dehydration 10/02/2017  . Dementia without behavioral disturbance ([place])   . Diabetes mellitus without complication ([place])   . Dyspnea 12/28/2012  . Elevated lactic acid level 01/28/2016  . Elevated troponin 02/26/2016  . Frequent falls 01/01/2017  . Gastroenteritis 10/06/2017  . Generalized weakness 01/01/2017  . GERD 11/27/2008   Qualifier: Diagnosis of  By: Cleda Mccreedy, Davis Gourd   . HCAP (healthcare-associated pneumonia) 02/26/2016  . Hyperglycemia 04/21/2015  . Hyperlipidemia   . Hypertension   . HYPERTENSION, BENIGN 11/27/2008   Qualifier: Diagnosis of  By: Cleda Mccreedy, Davis Gourd   . Hypokalemia 10/02/2017  . OTHER DYSPNEA AND RESPIRATORY ABNORMALITIES 05/27/2010   Qualifier: Diagnosis of  By: Owens Shark, RN, BSN, Lauren    .  Pulmonary embolism ([place]) 09/28/2019  . Sepsis ([place]) 01/28/2016  . Sleep apnea    no cpap used, sleep study was several yrs ago  . Syncope 10/02/2017  . Tachycardia 04/21/2015  . Urinary retention foley last changed 3 weeks ago   Social History   Socioeconomic History  . Marital status: Married    Spouse name: Not on file  . Number of children: Not on file  . Years of education: Not on file  . Highest education level: Not on file  Occupational History  . Not on file  Tobacco Use  . Smoking status: Former Smoker    Packs/day: 0.50    Years: 50.00    Pack years: 25.00    Types: Cigarettes    Quit date: 12/21/1980    Years since quitting: 39.1  . Smokeless tobacco: Never Used  Substance and Sexual Activity  . Alcohol use: Yes    Alcohol/week: 0.0 standard drinks    Comment: rare use  . Drug use: No  . Sexual activity: Never  Other Topics Concern  . Not on file  Social History Narrative  . Not on file   Social Determinants of Health   Financial Resource Strain:   . Difficulty of Paying Living Expenses: Not on file  Food Insecurity:   . Worried About Paediatric nurse in the Last Year: Not on file  . Ran Out of Food in the Last Year: Not on file  Transportation Needs:   . Lack of Transportation (Medical): Not on file  . Lack of Transportation (Non-Medical): Not on file  Physical Activity:   . Days of Exercise per Week: Not on file  . Minutes of Exercise per Session: Not on file  Stress:   . Feeling of Stress : Not on file  Social Connections:   . Frequency of Communication with Friends and Family: Not on file  . Frequency of Social Gatherings with Friends and Family: Not on file  . Attends Religious Services: Not on file  . Active Member of Clubs or Organizations: Not on file  . Attends Archivist Meetings: Not on file  . Marital Status: Not on file   Family History  Problem Relation Age of Onset  . Heart attack Father    Scheduled Meds: . finasteride  5 mg Oral QHS  . heparin  5,000 Units Subcutaneous Q8H   Continuous Infusions: . lactated ringers 125 mL/hr at 01/26/20 0450   PRN Meds:. Medications Prior to Admission:  Prior to Admission medications   Medication Sig Start Date End Date Taking? Authorizing Provider  atorvastatin (LIPITOR) 20 MG tablet Take 1 tablet (20 mg total) by mouth daily at 6 PM. 01/04/17  Yes Florencia Reasons, MD  donepezil (ARICEPT) 10 MG tablet Take 10 mg by mouth at bedtime.  01/09/16  Yes [provider]  finasteride (PROSCAR) 5 MG tablet Take 5 mg by mouth at bedtime.    Yes [provider]  furosemide (LASIX) 20 MG tablet Take 20 mg by mouth every morning. 12/04/19  Yes [provider]  glimepiride (AMARYL) 2 MG tablet Take 2 mg by mouth daily with breakfast.  12/16/18  Yes [provider]  Melatonin 10 MG TABS Take 10 mg by mouth at bedtime.   Yes [provider]  metoprolol succinate (TOPROL-XL) 50 MG 24 hr tablet Take 1 tablet (50 mg total) by mouth at bedtime. Patient is overdue for an appointment and needs to schedule for further refills 2nd  attempt  10/01/17  Yes Sherren Mocha, MD  mupirocin ointment (BACTROBAN) 2 % Apply 1 application topically 2 (two) times daily. To foot ulcer 12/20/19  Yes [provider]  rivaroxaban (XARELTO) 20 MG TABS tablet Take 1 tablet (20 mg total) by mouth daily with supper. 10/12/19  Yes Mikhail, Maryann, DO  insulin aspart (NOVOLOG) 100 UNIT/ML injection Inject 0-9 Units into the skin 3 (three) times daily with meals. CBG 121 - 150: 1 unit,   CBG 151 - 200: 2 units,   CBG 201 - 250: 3 units,   CBG 251 - 300: 5 units,   CBG 301 - 350: 7 units,   CBG 351 - 400: 9 units Patient not taking: Reported on 01/25/2020 10/06/19   Cristal Ford, DO  insulin glargine (LANTUS) 100 UNIT/ML injection Inject 0.2 mLs (20 Units total) into the skin daily. Patient not taking: Reported on 01/25/2020 10/07/19   Cristal Ford, DO  Rivaroxaban (XARELTO) 15 MG TABS tablet Take 1 tablet (15 mg total) by mouth 2 (two) times daily for 5 days. Patient not taking: Reported on 01/25/2020 10/06/19 01/25/20  Cristal Ford, DO   Allergies  Allergen Reactions  . Sulfa Antibiotics Swelling    Swelling of hands   Review of Systems  Constitutional: Positive for activity change, appetite change and fatigue.  Respiratory: Positive for shortness of breath.   Gastrointestinal: Positive for abdominal distention and abdominal pain.  Neurological: Positive for weakness.   Physical Exam Vitals and nursing note reviewed.  HENT:     Head: Normocephalic.     Nose: Nose normal.     Mouth/Throat:     Mouth: Mucous membranes are dry.  Eyes:     Pupils: Pupils are equal, round, and reactive to light.     Comments: Scleral jaundice  Cardiovascular:     Rate and Rhythm: Normal rate. Rhythm irregular.     Pulses: Normal pulses.  Pulmonary:     Effort: Pulmonary effort is normal.  Abdominal:     Tenderness: There is abdominal tenderness.  Musculoskeletal:     Cervical back: Normal range of motion.  Skin:    Capillary Refill:  Capillary refill takes less than 2 seconds.     Coloration: Skin is jaundiced.  Neurological:     Mental Status: He is alert.     Comments: Oriented to person, place, some of situation  Psychiatric:        Mood and Affect: Mood normal.    Vital Signs: BP 123/73 (BP Location: Right Wrist)   Pulse 91   Temp (!) 97.4 F (36.3 C) (Oral)   Resp 18   Ht '5\' 8"'$  (1.727 m)   Wt 101.2 kg   SpO2 98%   BMI 33.91 kg/m  Pain Scale: 0-10   Pain Score: 0-No pain  SpO2: SpO2: 98 % O2 Device:SpO2: 98 % O2 Flow Rate: .   IO: Intake/output summary:   Intake/Output Summary (Last 24 hours) at 01/26/2020 1706 Last data filed at 01/26/2020 1001 Gross per 24 hour  Intake 2433.15 ml  Output 600 ml  Net 1833.15 ml   LBM: Last BM Date: 01/25/20 Baseline Weight: Weight: 101.2 kg Most recent weight: Weight: 101.2 kg     Palliative Assessment/Data: 20%  Time In:1610 Time Out: 1720 Time Total: 70 Greater than 50%  of this time was spent counseling and coordinating care related to the above assessment and plan.  Signed by: Rosezella Rumpf, NP   Please contact Palliative Medicine Team  phone at [phone] for questions and concerns.  For individual provider: See Shea Evans

## 2020-01-26 NOTE — Progress Notes (Signed)
PROGRESS NOTE    [name] [name]  [id number] DOB: 1926-04-12 DOA: 01/25/2020 PCP: Haywood Pao, MD    Brief Narrative: 86 y.o. male with medical history significant of ckd stage 3 chronic afib cva diastolic chf admitted with generalized weakness and change in mental status.  Patient lives at home with 24-hour care.  When his caregiver came in today she noticed him acting differently did not recognize her history is obtained from the ER records patient's son and some from the patient.  When I saw him he was awake oriented to hospital.  He told me he is decreased his p.o. intake for the last few weeks and has lost some weight denies nausea vomiting diarrhea abdominal pain or urinary complaints.  Denies chest pain shortness of breath or cough.  Denies fever or chills Decreased appetite for past few weeks with unknown weight loss  ED Course: received ivf and kcl.98/52,93,17,afebrile 96 on ra  NA 133,K 3.0,BUN 40,CR 2.90,HB 14.6 COVID NEGATIVE UA trace leukocytes  21 to 50 wbc  CT ABD -Mass in the pancreatic head may measure as large as 4.4 x 4.8 cm, associated with marked biliary and pancreatic ductal dilation. Soft tissue abuts the splenic portal venous system at the level of the root of the mesentery. Findings of what is likely a pancreatic neoplasm with biliary pancreatic ductal obstruction. Contrasted imaging or endoscopic assessment may be helpful. Cholelithiasis.  Renal cortical scarring with multiple cysts of varying complexity bilaterally. No hydronephrosis or ureteral calculus.  Colonic diverticulosis.   Assessment & Plan:   Active Problems:   Acute kidney injury ([place])   Pancreatic mass   Biliary obstruction    #1 aki on ckd stage 3 secondary to dehydration-creatinine with IV fluids.  Creatinine down to 2.66 from 2.90 yesterday.  #2 pancreatic mass with pancreatic and biliary duct dilatation with jaundice likely malignant-appreciate GI input.  Awaiting palliative  care consultation with patient's son.    #3abnormal LFT S-likely secondary to #2 alkaline phosphatase is 1332, AST 791, ALT 501, total bilirubin 10.9.  #4hypotension/dehydration-blood pressure 123/73.  Continue to hold Lasix.  Continue to hold beta-blocker .  Consider restarting beta-blocker if his pressure improves.  #5 hypokalemia/hyponatremia-continue to decreased nutritional intake and dehydration.  Replete potassium 2.8.  #6 type 2 diabetes on Amaryl hold Amaryl will start SSI. CBG (last 3)  Recent Labs    01/25/20 1003  GLUCAP 175*    #7 chronic atrial fibrillation on Xarelto at home which has been on hold in case if he needs any procedures as well as Xarelto not a good option for her CKD.  #8 history of stroke with residual left-sided weakness  #9 history of obstructive sleep apnea not on CPAP at home.   Estimated body mass index is 33.91 kg/m as calculated from the following:   Height as of this encounter: 5\' 8"  (1.727 m).   Weight as of this encounter: 101.2 kg.  DVT prophylaxis: heparin Code Status: dnr Family Communication:dw son Disposition Plan: Patient came from home not sure where he will be discharged he is likely a hospice candidate is admitted with new pancreatic mass with obstruction to the biliary and pancreatic duct with jaundice.  Awaiting palliative care recommendations. Consultants:   Gi and palliative  Procedures:none Antimicrobials none Subjective:   Resting in bed multiple scratch marks entire body  Eating breakfast   Objective: Vitals:   01/25/20 1646 01/25/20 1710 01/26/20 0519 01/26/20 1359  BP: (!) 100/46 102/74 (!) 110/56 123/73  Pulse:  87 71 91  Resp:  16 15 18   Temp:  98.2 F (36.8 C) 97.7 F (36.5 C) (!) 97.4 F (36.3 C)  TempSrc:  Oral Oral Oral  SpO2:  100% 100% 98%  Weight:      Height:        Intake/Output Summary (Last 24 hours) at 01/26/2020 1440 Last data filed at 01/26/2020 1001 Gross per 24 hour  Intake  2433.15 ml  Output 600 ml  Net 1833.15 ml   Filed Weights   01/25/20 1158  Weight: 101.2 kg    Examination: Patient appears jaundiced multiple scratch marks all over his body General exam: Appears calm and comfortable  Respiratory system: Clear to auscultation. Respiratory effort normal. Cardiovascular system: S1 & S2 heard, irregular  irregular. No JVD, murmurs, rubs, gallops or clicks. No pedal edema. Gastrointestinal system: Abdomen is nondistended, soft and nontender. No organomegaly or masses felt. Normal bowel sounds heard. Central nervous system: Awake not oriented to place extremities: Trace edema with multiple scratch marks Skin: Multiple scratch marks with multiple purpuric lesions psychiatry:  unable to assess    Data Reviewed: I have personally reviewed following labs and imaging studies  CBC: Recent Labs  Lab 01/25/20 1033 01/25/20 1152 01/26/20 0132  WBC 10.4  --  8.1  NEUTROABS 9.0*  --  6.6  HGB 13.1 14.6 11.0*  HCT 38.6* 43.0 32.2*  MCV 94.6  --  93.6  PLT 181  --  99991111   Basic Metabolic Panel: Recent Labs  Lab 01/25/20 1033 01/25/20 1152 01/26/20 0132  NA 131* 133* 132*  K 3.0* 3.0* 2.6*  CL 91* 94* 94*  CO2 23  --  24  GLUCOSE 210* 200* 309*  BUN 43* 40* 41*  CREATININE 3.07* 2.90* 2.66*  CALCIUM 9.1  --  8.4*  MG  --   --  2.0   GFR: Estimated Creatinine Clearance: 20.4 mL/min (A) (by C-G formula based on SCr of 2.66 mg/dL (H)). Liver Function Tests: Recent Labs  Lab 01/25/20 1033 01/26/20 0132  AST 973* 791*  ALT 606* 501*  ALKPHOS 1,607* 1,332*  BILITOT 12.6* 10.9*  PROT 6.9 5.6*  ALBUMIN 2.8* 2.3*   Recent Labs  Lab 01/25/20 1202  LIPASE 43   Recent Labs  Lab 01/25/20 1033  AMMONIA 44*   Coagulation Profile: No results for input(s): INR, PROTIME in the last 168 hours. Cardiac Enzymes: No results for input(s): CKTOTAL, CKMB, CKMBINDEX, TROPONINI in the last 168 hours. BNP (last 3 results) No results for input(s):  PROBNP in the last 8760 hours. HbA1C: No results for input(s): HGBA1C in the last 72 hours. CBG: Recent Labs  Lab 01/25/20 1003  GLUCAP 175*   Lipid Profile: No results for input(s): CHOL, HDL, LDLCALC, TRIG, CHOLHDL, LDLDIRECT in the last 72 hours. Thyroid Function Tests: No results for input(s): TSH, T4TOTAL, FREET4, T3FREE, THYROIDAB in the last 72 hours. Anemia Panel: No results for input(s): VITAMINB12, FOLATE, FERRITIN, TIBC, IRON, RETICCTPCT in the last 72 hours. Sepsis Labs: Recent Labs  Lab 01/25/20 1033 01/25/20 1202  LATICACIDVEN 2.1* 1.4    Recent Results (from the past 240 hour(s))  Culture, blood (routine x 2)     Status: None (Preliminary result)   Collection Time: 01/25/20 10:35 AM   Specimen: BLOOD  Result Value Ref Range Status   Specimen Description BLOOD RIGHT ANTECUBITAL  Final   Special Requests   Final    BOTTLES DRAWN AEROBIC AND ANAEROBIC Blood Culture adequate volume  Culture   Final    NO GROWTH 1 DAY Performed at [facility], [place] [address], [place], [place] [address]    Report Status PENDING  Incomplete  Culture, blood (routine x 2)     Status: None (Preliminary result)   Collection Time: 01/25/20 11:20 AM   Specimen: BLOOD RIGHT HAND  Result Value Ref Range Status   Specimen Description BLOOD RIGHT HAND  Final   Special Requests   Final    BOTTLES DRAWN AEROBIC ONLY Blood Culture results may not be optimal due to an inadequate volume of blood received in culture bottles   Culture   Final    NO GROWTH 1 DAY Performed at [facility], [place] [address], [place], [place] [address]    Report Status PENDING  Incomplete  Respiratory Panel by RT PCR (Flu A&B, Covid) - Nasopharyngeal Swab     Status: None   Collection Time: 01/25/20 11:47 AM   Specimen: Nasopharyngeal Swab  Result Value Ref Range Status   SARS Coronavirus 2 by RT PCR NEGATIVE NEGATIVE Final    Comment: (NOTE) SARS-CoV-2 target nucleic acids are NOT DETECTED. The  SARS-CoV-2 RNA is generally detectable in upper respiratoy specimens during the acute phase of infection. The lowest concentration of SARS-CoV-2 viral copies this assay can detect is 131 copies/mL. A negative result does not preclude SARS-Cov-2 infection and should not be used as the sole basis for treatment or other patient management decisions. A negative result may occur with  improper specimen collection/handling, submission of specimen other than nasopharyngeal swab, presence of viral mutation(s) within the areas targeted by this assay, and inadequate number of viral copies (<131 copies/mL). A negative result must be combined with clinical observations, patient history, and epidemiological information. The expected result is Negative. Fact Sheet for Patients:  PinkCheek.be Fact Sheet for Healthcare Providers:  GravelBags.it This test is not yet ap proved or cleared by the [place] FDA and  has been authorized for detection and/or diagnosis of SARS-CoV-2 by FDA under an Emergency Use Authorization (EUA). This EUA will remain  in effect (meaning this test can be used) for the duration of the COVID-19 declaration under Section 564(b)(1) of the Act, 21 U.S.C. section 360bbb-3(b)(1), unless the authorization is terminated or revoked sooner.    Influenza A by PCR NEGATIVE NEGATIVE Final   Influenza B by PCR NEGATIVE NEGATIVE Final    Comment: (NOTE) The Xpert Xpress SARS-CoV-2/FLU/RSV assay is intended as an aid in  the diagnosis of influenza from Nasopharyngeal swab specimens and  should not be used as a sole basis for treatment. Nasal washings and  aspirates are unacceptable for Xpert Xpress SARS-CoV-2/FLU/RSV  testing. Fact Sheet for Patients: PinkCheek.be Fact Sheet for Healthcare Providers: GravelBags.it This test is not yet approved or cleared by the [place] FDA and  has been authorized for detection and/or diagnosis of SARS-CoV-2 by  FDA under an Emergency Use Authorization (EUA). This EUA will remain  in effect (meaning this test can be used) for the duration of the  Covid-19 declaration under Section 564(b)(1) of the Act, 21  U.S.C. section 360bbb-3(b)(1), unless the authorization is  terminated or revoked. Performed at [facility], [place] [address], [place], [place] [address]   Urine culture     Status: None   Collection Time: 01/25/20  2:10 PM   Specimen: Urine, Random  Result Value Ref Range Status   Specimen Description URINE, RANDOM  Final   Special  Requests NONE  Final   Culture   Final    NO GROWTH Performed at [facility], [place] [address], [place], [place] [address]    Report Status 01/26/2020 FINAL  Final         Radiology Studies: CT ABDOMEN PELVIS WO CONTRAST  Result Date: 01/25/2020 CLINICAL DATA:  Jaundice EXAM: CT ABDOMEN AND PELVIS WITHOUT CONTRAST TECHNIQUE: Multidetector CT imaging of the abdomen and pelvis was performed following the standard protocol without IV contrast. COMPARISON:  CT angiography of the chest dated 09/27/2019 and CT of the abdomen and pelvis from 2005 FINDINGS: Lower chest: No signs of consolidation or evidence of pleural effusion. No pericardial fluid. Hepatobiliary: Massive biliary ductal distension. Masslike appearance of pancreatic head may measure as large as 4.4 x 4.8 cm, associated with ductal dilation of the pancreas. Soft tissue abuts the splenic portal venous system at the level of the root of the mesentery. Stranding extends towards the SMA. Not well assessed without contrast. Cholelithiasis. Pancreas: As above with mass in the pancreatic head and biliary and pancreatic ductal dilation. Spleen: Normal spleen. Adrenals/Urinary Tract: Normal adrenal glands. Renal cortical scarring. Cysts of varying complexity bilaterally. Renal vascular calcification. No hydronephrosis or  ureteral calculus. Stomach/Bowel: No signs of acute gastrointestinal process. Colonic diverticulosis. Vascular/Lymphatic: Calcified atherosclerotic plaque throughout the abdominal aorta. No aneurysm. No adenopathy in the upper abdomen. No signs of pelvic lymphadenopathy. Reproductive: Prostate with calcification and heterogeneity likely with TURP. Other: No abdominal wall hernia or abnormality. No abdominopelvic ascites. Musculoskeletal: Spinal degenerative changes and signs of lumbosacral spinal fusion. No signs of acute bone finding or destructive bone process. IMPRESSION: 1. Mass in the pancreatic head may measure as large as 4.4 x 4.8 cm, associated with marked biliary and pancreatic ductal dilation. Soft tissue abuts the splenic portal venous system at the level of the root of the mesentery. Findings of what is likely a pancreatic neoplasm with biliary pancreatic ductal obstruction. Contrasted imaging or endoscopic assessment may be helpful. 2. Cholelithiasis. 3. Renal cortical scarring with multiple cysts of varying complexity bilaterally. No hydronephrosis or ureteral calculus. 4. Colonic diverticulosis. Aortic Atherosclerosis (ICD10-I70.0). Electronically Signed   By: Zetta Bills M.D.   On: 01/25/2020 14:49   CT Head Wo Contrast  Result Date: 01/25/2020 CLINICAL DATA:  Altered level of consciousness, weakness, confusion EXAM: CT HEAD WITHOUT CONTRAST TECHNIQUE: Contiguous axial images were obtained from the base of the skull through the vertex without intravenous contrast. COMPARISON:  09/27/2019 FINDINGS: Brain: Chronic confluent hypodensities throughout the periventricular and subcortical white matter consistent with chronic small vessel ischemic changes. No signs of acute infarct or hemorrhage. Ex vacuo dilatation of lateral ventricles again noted and stable. Remaining midline structures are unremarkable. No acute extra-axial fluid collections. No mass effect. Vascular: No hyperdense vessel or  unexpected calcification. Skull: Normal. Negative for fracture or focal lesion. Sinuses/Orbits: No acute finding. Other: None IMPRESSION: 1. Chronic ischemic changes as above.  No acute process. Electronically Signed   By: Randa Ngo M.D.   On: 01/25/2020 11:09   DG Chest Portable 1 View  Result Date: 01/25/2020 CLINICAL DATA:  Altered mental status. EXAM: PORTABLE CHEST 1 VIEW COMPARISON:  September 27, 2019. FINDINGS: The heart size and mediastinal contours are within normal limits. Both lungs are clear. The visualized skeletal structures are unremarkable. IMPRESSION: No active disease. Aortic Atherosclerosis (ICD10-I70.0). Electronically Signed   By: Marijo Conception M.D.   On: 01/25/2020 10:15        Scheduled  Meds: . finasteride  5 mg Oral QHS  . heparin  5,000 Units Subcutaneous Q8H   Continuous Infusions: . lactated ringers 125 mL/hr at 01/26/20 0450     LOS: 1 day     Georgette Shell, MD [facility]  If 7PM-7AM, please contact night-coverage [email or web address] Password [email or web address] 01/26/2020, 2:40 PM

## 2020-01-26 NOTE — Social Work (Signed)
CSW acknowledging consult for comfort care. Referral made per consult to [facility]. Liaison has left for the day; message left for team to f/u on Saturday 2/6.    Alexander Mt, MSW, [place] [facility]

## 2020-01-27 DIAGNOSIS — Z515 Encounter for palliative care: Secondary | ICD-10-CM

## 2020-01-27 LAB — CBC
HCT: 30.3 % — ABNORMAL LOW (ref 39.0–52.0)
Hemoglobin: 10.4 g/dL — ABNORMAL LOW (ref 13.0–17.0)
MCH: 32.3 pg (ref 26.0–34.0)
MCHC: 34.3 g/dL (ref 30.0–36.0)
MCV: 94.1 fL (ref 80.0–100.0)
Platelets: 145 10*3/uL — ABNORMAL LOW (ref 150–400)
RBC: 3.22 MIL/uL — ABNORMAL LOW (ref 4.22–5.81)
RDW: 15.1 % (ref 11.5–15.5)
WBC: 8.7 10*3/uL (ref 4.0–10.5)
nRBC: 0 % (ref 0.0–0.2)

## 2020-01-27 LAB — BASIC METABOLIC PANEL
Anion gap: 13 (ref 5–15)
BUN: 32 mg/dL — ABNORMAL HIGH (ref 8–23)
CO2: 22 mmol/L (ref 22–32)
Calcium: 8.8 mg/dL — ABNORMAL LOW (ref 8.9–10.3)
Chloride: 97 mmol/L — ABNORMAL LOW (ref 98–111)
Creatinine, Ser: 2.37 mg/dL — ABNORMAL HIGH (ref 0.61–1.24)
GFR calc Af Amer: 27 mL/min — ABNORMAL LOW (ref >60)
GFR calc non Af Amer: 23 mL/min — ABNORMAL LOW (ref >60)
Glucose, Bld: 178 mg/dL — ABNORMAL HIGH (ref 70–99)
Potassium: 3.6 mmol/L (ref 3.5–5.1)
Sodium: 132 mmol/L — ABNORMAL LOW (ref 135–145)

## 2020-01-27 MED ORDER — LORAZEPAM 2 MG/ML IJ SOLN: 0.5000 mg | INTRAMUSCULAR | Status: DC | PRN

## 2020-01-27 MED ORDER — DIPHENHYDRAMINE HCL 25 MG PO CAPS
25.0000 mg | ORAL_CAPSULE | Freq: Four times a day (QID) | ORAL | Status: DC | PRN
  Administered 2020-01-27 – 2020-01-28 (×3): 25 mg via ORAL
  Filled 2020-01-27 (×3): qty 1

## 2020-01-27 MED ORDER — ACETAMINOPHEN 325 MG PO TABS
650.0000 mg | ORAL_TABLET | Freq: Four times a day (QID) | ORAL | Status: DC | PRN
  Administered 2020-01-28: 650 mg via ORAL
  Filled 2020-01-27: qty 2

## 2020-01-27 MED ORDER — DIPHENHYDRAMINE HCL 25 MG PO CAPS
25.0000 mg | ORAL_CAPSULE | Freq: Once | ORAL | Status: AC
  Administered 2020-01-27: 25 mg via ORAL
  Filled 2020-01-27: qty 1

## 2020-01-27 MED ORDER — GLYCOPYRROLATE 0.2 MG/ML IJ SOLN: 0.4000 mg | INTRAMUSCULAR | Status: DC | PRN

## 2020-01-27 MED ORDER — BIOTENE DRY MOUTH MT LIQD: 15.0000 mL | OROMUCOSAL | Status: DC | PRN

## 2020-01-27 MED ORDER — HYDROMORPHONE HCL 1 MG/ML IJ SOLN: 0.5000 mg | INTRAMUSCULAR | Status: DC | PRN

## 2020-01-27 MED ORDER — POLYVINYL ALCOHOL 1.4 % OP SOLN
1.0000 [drp] | OPHTHALMIC | Status: DC | PRN
  Filled 2020-01-27: qty 15

## 2020-01-27 MED ORDER — ONDANSETRON HCL 4 MG/2ML IJ SOLN: 4.0000 mg | Freq: Four times a day (QID) | INTRAMUSCULAR | Status: DC | PRN

## 2020-01-27 NOTE — Progress Notes (Signed)
Brief GI progress note  Reviewed chart and discussed with patient's primary team, patient transitioning to hospice.  No plan for endoscopic intervention or evaluation at this time. GI will sign off but please call us back if something changes.  Justice Britain, MD [facility] # [phone]

## 2020-01-27 NOTE — Progress Notes (Signed)
PROGRESS NOTE    [name] [name]  [id number] DOB: 21-Jul-1926 DOA: 01/25/2020 PCP: Haywood Pao, MD    Brief Narrative:86 y.o.malewith medical history significant ofckd stage 3 chronic afib cva diastolic chfadmitted with generalized weakness and change in mental status. Patient lives at home with 24-hour care. When his caregiver came in today she noticed him acting differently did not recognize her history is obtained from the ER records patient's son and some from the patient. When I saw him he was awake oriented to hospital. He told me he is decreased his p.o. intake for the last few weeks and has lost some weight denies nausea vomiting diarrhea abdominal pain or urinary complaints. Denies chest pain shortness of breath or cough. Denies fever or chills Decreased appetite for past few weeks with unknown weight loss  ED Course:received ivf and kcl.98/52,93,17,afebrile 96 on ra  NA 133,K 3.0,BUN 40,CR 2.90,HB 14.6 COVID NEGATIVE UA trace leukocytes 21 to 50 wbc  CT ABD -Mass in the pancreatic head may measure as large as 4.4 x 4.8 cm, associated with marked biliary and pancreatic ductal dilation. Soft tissue abuts the splenic portal venous system at the level of the root of the mesentery. Findings of what is likely a pancreatic neoplasm with biliary pancreatic ductal obstruction. Contrasted imaging or endoscopic assessment may be helpful. Cholelithiasis. Renal cortical scarring with multiple cysts of varying complexity bilaterally. No hydronephrosis or ureteral calculus. Colonic diverticulosis.  Assessment & Plan:   Active Problems:   Acute kidney injury [facility])   Pancreatic mass   Biliary obstruction   Palliative care by specialist   Goals of care, counseling/discussion   Pancreatic pain  #1 aki on ckd stage 3secondary to dehydration-he was given IV fluids.  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  Family deciding to go for hospice likely Monday.  #3abnormal LFT S-likely secondary to #2 alkaline phosphatase is 1332, AST 791, ALT 501, total bilirubin 10.9.  #4hypotension/dehydration-blood pressure 123/73.  .  #5 hypokalemia/hyponatremia-repleted  #6 type 2 diabetes on Amaryl hold Amaryl will start SSI.  #7 chronic atrial fibrillation on Xarelto at home  #8 history of stroke with residual left-sided weakness  #9 history of obstructive sleep apnea not on CPAP at home.    Estimated body mass index is 33.91 kg/m as calculated from the following:   Height as of this encounter: 5\' 8"  (1.727 m).   Weight as of this encounter: 101.2 kg.  DVT prophylaxis: None patient will be placed on hospice tomorrow or Monday Code Status: DO NOT RESUSCITATE  family Communication discussed with son and daughter over the phone Disposition Plan plan is to discharge him with hospice on Monday or when bed becomes available to [facility]. Consultants:   GI and palliative care  Procedures: None Antimicrobials: None  Subjective: Sitting up by the side of the bed a lot of scratch marks and scabs noted  Objective: Vitals:   01/26/20 0519 01/26/20 1359 01/26/20 2130 01/27/20 1439  BP: (!) 110/56 123/73 117/76 (!) 123/94  Pulse: 71 91 93 98  Resp: 15 18 18    Temp: 97.7 F (36.5 C) (!) 97.4 F (36.3 C) 97.7 F (36.5 C) 98.3 F (36.8 C)  TempSrc: Oral Oral Oral Oral  SpO2: 100% 98% 98% 99%  Weight:      Height:        Intake/Output Summary (Last 24 hours) at 01/27/2020 1537 Last data filed at 01/27/2020 1401 Gross per  24 hour  Intake 3288.9 ml  Output 1200 ml  Net 2088.9 ml   Filed Weights   01/25/20 1158  Weight: 101.2 kg    Examination: Jaundiced scleral icterus skin yellow multiple scratch marks and scabs throughout the body noted General exam: Appears calm and comfortable  Respiratory system: Clear to auscultation. Respiratory effort  normal. Cardiovascular system: S1 & S2 heard, RRR. No JVD, murmurs, rubs, gallops or clicks. No pedal edema. Gastrointestinal system: Abdomen is nondistended, soft and nontender. No organomegaly or masses felt. Normal bowel sounds heard. Central nervous system: Alert and oriented. No focal neurological deficits. Extremities: Symmetric 5 x 5 power. Skin: Multiple scabs and scratch marks noted    Data Reviewed: I have personally reviewed following labs and imaging studies  CBC: Recent Labs  Lab 01/25/20 1033 01/25/20 1152 01/26/20 0132 01/27/20 0839  WBC 10.4  --  8.1 8.7  NEUTROABS 9.0*  --  6.6  --   HGB 13.1 14.6 11.0* 10.4*  HCT 38.6* 43.0 32.2* 30.3*  MCV 94.6  --  93.6 94.1  PLT 181  --  155 [id number]*   Basic Metabolic Panel: Recent Labs  Lab 01/25/20 1033 01/25/20 1152 01/26/20 0132 01/27/20 0839  NA 131* 133* 132* 132*  K 3.0* 3.0* 2.6* 3.6  CL 91* 94* 94* 97*  CO2 23  --  24 22  GLUCOSE 210* 200* 309* 178*  BUN 43* 40* 41* 32*  CREATININE 3.07* 2.90* 2.66* 2.37*  CALCIUM 9.1  --  8.4* 8.8*  MG  --   --  2.0  --    GFR: Estimated Creatinine Clearance: 22.9 mL/min (A) (by C-G formula based on SCr of 2.37 mg/dL (H)). Liver Function Tests: Recent Labs  Lab 01/25/20 1033 01/26/20 0132  AST 973* 791*  ALT 606* 501*  ALKPHOS 1,607* 1,332*  BILITOT 12.6* 10.9*  PROT 6.9 5.6*  ALBUMIN 2.8* 2.3*   Recent Labs  Lab 01/25/20 1202  LIPASE 43   Recent Labs  Lab 01/25/20 1033  AMMONIA 44*   Coagulation Profile: No results for input(s): INR, PROTIME in the last 168 hours. Cardiac Enzymes: No results for input(s): CKTOTAL, CKMB, CKMBINDEX, TROPONINI in the last 168 hours. BNP (last 3 results) No results for input(s): PROBNP in the last 8760 hours. HbA1C: No results for input(s): HGBA1C in the last 72 hours. CBG: Recent Labs  Lab 01/25/20 1003  GLUCAP 175*   Lipid Profile: No results for input(s): CHOL, HDL, LDLCALC, TRIG, CHOLHDL, LDLDIRECT in the last  72 hours. Thyroid Function Tests: No results for input(s): TSH, T4TOTAL, FREET4, T3FREE, THYROIDAB in the last 72 hours. Anemia Panel: No results for input(s): VITAMINB12, FOLATE, FERRITIN, TIBC, IRON, RETICCTPCT in the last 72 hours. Sepsis Labs: Recent Labs  Lab 01/25/20 1033 01/25/20 1202  LATICACIDVEN 2.1* 1.4    Recent Results (from the past 240 hour(s))  Culture, blood (routine x 2)     Status: None (Preliminary result)   Collection Time: 01/25/20 10:35 AM   Specimen: BLOOD  Result Value Ref Range Status   Specimen Description BLOOD RIGHT ANTECUBITAL  Final   Special Requests   Final    BOTTLES DRAWN AEROBIC AND ANAEROBIC Blood Culture adequate volume   Culture   Final    NO GROWTH 2 DAYS Performed at [facility], [address], [place], [place] [address]    Report Status PENDING  Incomplete  Culture, blood (routine x 2)     Status: None (Preliminary result)  Collection Time: 01/25/20 11:20 AM   Specimen: BLOOD RIGHT HAND  Result Value Ref Range Status   Specimen Description BLOOD RIGHT HAND  Final   Special Requests   Final    BOTTLES DRAWN AEROBIC ONLY Blood Culture results may not be optimal due to an inadequate volume of blood received in culture bottles   Culture   Final    NO GROWTH 2 DAYS Performed at [facility], [place] [address] [place], [place] [address]    Report Status PENDING  Incomplete  Respiratory Panel by RT PCR (Flu A&B, Covid) - Nasopharyngeal Swab     Status: None   Collection Time: 01/25/20 11:47 AM   Specimen: Nasopharyngeal Swab  Result Value Ref Range Status   SARS Coronavirus 2 by RT PCR NEGATIVE NEGATIVE Final    Comment: (NOTE) SARS-CoV-2 target nucleic acids are NOT DETECTED. The SARS-CoV-2 RNA is generally detectable in upper respiratoy specimens during the acute phase of infection. The lowest concentration of SARS-CoV-2 viral copies this assay can detect is 131 copies/mL. A negative result does not preclude SARS-Cov-2  infection and should not be used as the sole basis for treatment or other patient management decisions. A negative result may occur with  improper specimen collection/handling, submission of specimen other than nasopharyngeal swab, presence of viral mutation(s) within the areas targeted by this assay, and inadequate number of viral copies (<131 copies/mL). A negative result must be combined with clinical observations, patient history, and epidemiological information. The expected result is Negative. Fact Sheet for Patients:  PinkCheek.be Fact Sheet for Healthcare Providers:  GravelBags.it This test is not yet ap proved or cleared by the [place] FDA and  has been authorized for detection and/or diagnosis of SARS-CoV-2 by FDA under an Emergency Use Authorization (EUA). This EUA will remain  in effect (meaning this test can be used) for the duration of the COVID-19 declaration under Section 564(b)(1) of the Act, 21 U.S.C. section 360bbb-3(b)(1), unless the authorization is terminated or revoked sooner.    Influenza A by PCR NEGATIVE NEGATIVE Final   Influenza B by PCR NEGATIVE NEGATIVE Final    Comment: (NOTE) The Xpert Xpress SARS-CoV-2/FLU/RSV assay is intended as an aid in  the diagnosis of influenza from Nasopharyngeal swab specimens and  should not be used as a sole basis for treatment. Nasal washings and  aspirates are unacceptable for Xpert Xpress SARS-CoV-2/FLU/RSV  testing. Fact Sheet for Patients: PinkCheek.be Fact Sheet for Healthcare Providers: GravelBags.it This test is not yet approved or cleared by the [place] FDA and  has been authorized for detection and/or diagnosis of SARS-CoV-2 by  FDA under an Emergency Use Authorization (EUA). This EUA will remain  in effect (meaning this test can be used) for the duration of the  Covid-19 declaration  under Section 564(b)(1) of the Act, 21  U.S.C. section 360bbb-3(b)(1), unless the authorization is  terminated or revoked. Performed at [facility], [place] [address], [place], [place] [address]   Urine culture     Status: None   Collection Time: 01/25/20  2:10 PM   Specimen: Urine, Random  Result Value Ref Range Status   Specimen Description URINE, RANDOM  Final   Special Requests NONE  Final   Culture   Final    NO GROWTH Performed at [facility], [place] [address] [place],  [address]    Report Status 01/26/2020 FINAL  Final         Radiology Studies: No  results found.      Scheduled Meds: . finasteride  5 mg Oral QHS  . heparin  5,000 Units Subcutaneous Q8H   Continuous Infusions: . lactated ringers 125 mL/hr at 01/27/20 1401     LOS: 2 days     Georgette Shell, MD 01/27/2020, 3:37 PM

## 2020-01-27 NOTE — Progress Notes (Signed)
Manufacturing engineer [facility])  Spoke with son and daughter to answer questions and provide support.  They are interested in residential hospice at [facility].  They are aware of no bed availability today but that we will follow up once one becomes available.  Thank you, Venia Carbon RN, BSN, [facility] Liaison  [phone]

## 2020-01-27 NOTE — Progress Notes (Signed)
Palliative Medicine Inpatient Follow Up Note   HPI: Per intake H&P --> [name] [name] a 86 y.o.malewith medical history significant ofckd stage 3 chronic afib cva diastolic chfadmitted with generalized weakness and change in mental status. Patient lives at home with 24-hour care. When his caregiver came in today she noticed him acting differently did not recognize her history is obtained from the ER records patient's son and some from the patient. When I saw him he was awake oriented to hospital. He told me he is decreased his p.o. intake for the last few weeks and has lost some weight denies nausea vomiting diarrhea abdominal pain or urinary complaints. Denies chest pain shortness of breath or cough. Denies fever or chills. Decreased appetite for past few weeks with unknown weight loss Patient reports pruritus with yellowish discoloration of the skin.  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   On 2/5 met with patients, his son, and daughter on speaker phone we discussed patient present health state. Allowed a night to digest the information given.   Today's Discussion (01/27/2020): Chart reviewed. Touched base with bedside nurse. Patient appears more jaundiced today. He endorses feeling tired. He had gotten out of bed earlier and just received some oxycodone prior to my arrival. He endorsed that this was helpful with his abdominal pain.   Spoke to patients daughter, [name] [name] around noon. She stated that she was having a hard time deciding what the right choice is though she and her brother were leaning more towards comfort with hospice house transition. We talked quite a bit about the pancreatic head mass seen on imaging and how it is likely a malignancy. In the absence of a tissue biopsy though we cannot say this 100%. [name] [name] seems to be having a hard time with this. I shared that the specialists all feel that based upon imaging and clinical symptoms this  is more likely a malignancy than not. She requested that the medicine team call her to discuss this further.  I met with patients son, [name] [name] and his [name] [name] on speakerphone. She at this point had not heard from the medicine team so did not feel comfortable making any decisions. Reassured her that she and her family can take some time to talk about the best options once they hear from the medicine team.  We dicussed in great detail what comfort measures look like in the hospital. Talked about removing unnecessary treatments/interventions and truly focusing on symptom management. We discussed the various modalities we utilize to accomplish this which [name] [name] and [name] [name] verbalized understanding of.   Discussed the importance of continued conversation with family and their medical providers regarding overall plan of care and treatment options, ensuring decisions are within the context of the patients values and GOCs.  Questions and concerns addressed   Vital Signs Vitals:   01/26/20 2130 01/27/20 1439  BP: 117/76 (!) 123/94  Pulse: 93 98  Resp: 18   Temp: 97.7 F (36.5 C) 98.3 F (36.8 C)  SpO2: 98% 99%    Intake/Output Summary (Last 24 hours) at 01/27/2020 1507 Last data filed at 01/27/2020 1401 Gross per 24 hour  Intake 3288.9 ml  Output 1200 ml  Net 2088.9 ml   Last Weight  Most recent update: 01/25/2020 11:59 AM   Weight  101.2 kg (223 lb)          Physical Exam Vitals and nursing note reviewed.  HENT:     Head: Normocephalic.  Nose: Nose normal.     Mouth/Throat:     Mouth: Mucous membranes are dry.  Eyes:     Pupils: Pupils are equal, round, and reactive to light.     Comments: Scleral jaundice  Cardiovascular:     Rate and Rhythm: Normal rate. Rhythm irregular.     Pulses: Normal pulses.  Pulmonary:     Effort: Pulmonary effort is normal.  Abdominal:     Tenderness: There is abdominal tenderness.  Musculoskeletal:     Cervical back: Normal range of motion.  Skin:     Capillary Refill: Capillary refill takes less than 2 seconds.     Coloration: Skin is jaundiced.  Neurological:     Mental Status: He is alert.     Comments: Oriented to person, place, some of situation  Psychiatric:        Mood and Affect: Mood normal.   SUMMARY OF RECOMMENDATIONS   DNR, no extraordinary life prolonging measures  Family would like to speak with hospitalist regarding pancreatic mass, spoke to Dr. Rodena Piety who will reach out to them this afternoon.  Continental referral made on 2/6  Code Status/Advance Care Planning:  DNR  Symptom Management:  Pain, d/t pancreatic head mass:                 - Dilaudid 0.65m IVP Q4H PRN Breakthrough pain                 - Oxycodone 2.5-524mPO Q4H PRN moderate pain  Time Spent: 35 Greater than 50% of the time was spent in counseling and coordination of care ______________________________________________________________________________________ [facility] Team Cell Phone: [phone] utilize secure chat with additional questions, if there is no response within 30 minutes please call the above phone number  Palliative Medicine Team providers are available by phone from 7am to 7pm daily and can be reached through the team cell phone.  Should this patient require assistance outside of these hours, please call the patient's attending physician.

## 2020-01-27 NOTE — Progress Notes (Signed)
  Palliative Medicine Inpatient Follow Up Note  Spoke to patients daughter, Jocelyn Lamer this afternoon after her discussion with Dr. Rodena Piety. She and her brother, Fritz Pickerel have decided to pursue comfort oriented care. They are hopeful patient can transition to [facility] once a bed becomes available.   SUMMARY OF RECOMMENDATIONS Transition to comfort focused care  DNR, no extraordinary life prolonging measures  [facility] referral made   Code Status/Advance Care Planning:  DNR  Comfort Measures  Symptom Management: Dyspnea:  - Dilaudid 0.5-1mg  IVP PRN Generalized Pain:  - Oxycodone 2.5-5mg  PO Q4H PRN   Fever:  - Tylenol 650mg  Po Q6H PRN Agitation: Anxiety:  - Lorazepam 1mg  IV  Q1H PRN Nausea:  - Zofran 4mg  IV Q6H PRN  Secretions:  - Glycopyrrolate 0.4mg  IV Q4H PRN Dry Eyes:  - Artificial Tears PRN Xerostomia:  - Biotene 70ml PRN  - BID oral care Urinary Retention:  - Bladder scan Qshift if > 356ml place foley  Constipation:  - Bisacodyl 10mg  PR PRN QDay Spiritual:  - Chaplain consult  Time Spent: 30 Greater than 50% of the time was spent in counseling and coordination of care ______________________________________________________________________________________ [place] Team Team Cell Phone: [phone] Please utilize secure chat with additional questions, if there is no response within 30 minutes please call the above phone number  Palliative Medicine Team providers are available by phone from 7am to 7pm daily and can be reached through the team cell phone.  Should this patient require assistance outside of these hours, please call the patient's attending physician.

## 2020-01-27 NOTE — Plan of Care (Signed)
  Problem: Education: Goal: Knowledge of General Education information will improve Description: Including pain rating scale, medication(s)/side effects and non-pharmacologic comfort measures Outcome: Progressing   Problem: Health Behavior/Discharge Planning: Goal: Ability to manage health-related needs will improve Outcome: Progressing   Problem: Clinical Measurements: Goal: Ability to maintain clinical measurements within normal limits will improve Outcome: Progressing Goal: Respiratory complications will improve Outcome: Progressing Goal: Cardiovascular complication will be avoided Outcome: Progressing   Problem: Activity: Goal: Risk for activity intolerance will decrease Outcome: Progressing   Problem: Nutrition: Goal: Adequate nutrition will be maintained Outcome: Progressing   Problem: Elimination: Goal: Will not experience complications related to urinary retention Outcome: Progressing   Problem: Pain Managment: Goal: General experience of comfort will improve Outcome: Progressing   Problem: Safety: Goal: Ability to remain free from injury will improve Outcome: Progressing   Problem: Skin Integrity: Goal: Risk for impaired skin integrity will decrease Outcome: Progressing   

## 2020-01-27 NOTE — Progress Notes (Signed)
Manufacturing engineer  Referral received for residential hospice from [facility].  Noted family meeting today @ 68 with PMT for final discussions (ERCP vs full comfort and residential hospice).  Will await update from PMT team.  Venia Carbon RN, BSN, [facility] Liaison (in [place]) [phone]

## 2020-01-28 DIAGNOSIS — R000 Tachycardia, unspecified: Secondary | ICD-10-CM

## 2020-01-28 DIAGNOSIS — L299 Pruritus, unspecified: Secondary | ICD-10-CM

## 2020-01-28 DIAGNOSIS — E871 Hypo-osmolality and hyponatremia: Secondary | ICD-10-CM

## 2020-01-28 DIAGNOSIS — E1121 Type 2 diabetes mellitus with diabetic nephropathy: Secondary | ICD-10-CM

## 2020-01-28 DIAGNOSIS — R945 Abnormal results of liver function studies: Secondary | ICD-10-CM

## 2020-01-28 DIAGNOSIS — N1831 Chronic kidney disease, stage 3a: Secondary | ICD-10-CM

## 2020-01-28 DIAGNOSIS — Z515 Encounter for palliative care: Secondary | ICD-10-CM

## 2020-01-28 DIAGNOSIS — R7989 Other specified abnormal findings of blood chemistry: Secondary | ICD-10-CM | POA: Diagnosis present

## 2020-01-28 DIAGNOSIS — Z794 Long term (current) use of insulin: Secondary | ICD-10-CM

## 2020-01-28 LAB — BASIC METABOLIC PANEL
Anion gap: 10 (ref 5–15)
BUN: 28 mg/dL — ABNORMAL HIGH (ref 8–23)
CO2: 25 mmol/L (ref 22–32)
Calcium: 8.5 mg/dL — ABNORMAL LOW (ref 8.9–10.3)
Chloride: 98 mmol/L (ref 98–111)
Creatinine, Ser: 2.12 mg/dL — ABNORMAL HIGH (ref 0.61–1.24)
GFR calc Af Amer: 30 mL/min — ABNORMAL LOW (ref >60)
GFR calc non Af Amer: 26 mL/min — ABNORMAL LOW (ref >60)
Glucose, Bld: 179 mg/dL — ABNORMAL HIGH (ref 70–99)
Potassium: 3.6 mmol/L (ref 3.5–5.1)
Sodium: 133 mmol/L — ABNORMAL LOW (ref 135–145)

## 2020-01-28 MED ORDER — DIPHENHYDRAMINE-ZINC ACETATE 2-0.1 % EX CREA
TOPICAL_CREAM | Freq: Three times a day (TID) | CUTANEOUS | Status: DC | PRN
  Filled 2020-01-28: qty 28

## 2020-01-28 MED ORDER — HYDROXYZINE HCL 25 MG PO TABS
25.0000 mg | ORAL_TABLET | Freq: Three times a day (TID) | ORAL | Status: DC | PRN
  Administered 2020-01-28: 25 mg via ORAL
  Filled 2020-01-28 (×2): qty 1

## 2020-01-28 NOTE — Plan of Care (Signed)

## 2020-01-28 NOTE — Progress Notes (Signed)
Manufacturing engineer [facility])  [place] does not have a bed for Mr. Lowrey today.  Family is aware we will update once a bed becomes available.  Venia Carbon RN, BSN, [facility] Liaison (in [place]) [phone]

## 2020-01-28 NOTE — Progress Notes (Signed)
Palliative Medicine Inpatient Follow Up Note   HPI: Per intake H&P --> [name] [name] a 86 y.o.malewith medical history significant ofckd stage 3 chronic afib cva diastolic chfadmitted with generalized weakness and change in mental status. Patient lives at home with 24-hour care. When his caregiver came in today she noticed him acting differently did not recognize her history is obtained from the ER records patient's son and some from the patient. When I saw him he was awake oriented to hospital. He told me he is decreased his p.o. intake for the last few weeks and has lost some weight denies nausea vomiting diarrhea abdominal pain or urinary complaints. Denies chest pain shortness of breath or cough. Denies fever or chills. Decreased appetite for past few weeks with unknown weight loss Patient reports pruritus with yellowish discoloration of the skin.  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   On 2/5 met with patients, his son, and daughter on speaker phone we discussed patient present health state. Allowed a night to digest the information given.   On 2/6 transitioned to comfort measures.   Today's Discussion (01/28/2020): Met with patient bedside. He was resting at the time of assessment, appears comfortable. No family present at bedside. Per discussion with nursing technician he has been itching quite bit.   Vital Signs Vitals:   01/27/20 1439 01/28/20 0409  BP: (!) 123/94 116/67  Pulse: 98 (!) 108  Resp:  17  Temp: 98.3 F (36.8 C) 98.4 F (36.9 C)  SpO2: 99% 97%    Intake/Output Summary (Last 24 hours) at 01/28/2020 1152 Last data filed at 01/28/2020 0825 Gross per 24 hour  Intake 545 ml  Output 1100 ml  Net -555 ml   Last Weight  Most recent update: 01/25/2020 11:59 AM   Weight  101.2 kg (223 lb)          Physical Exam Vitals and nursing note reviewed.  HENT:     Head: Normocephalic.     Nose: Nose normal.   Mouth/Throat:     Mouth: Mucous membranes are dry.  Eyes:     Pupils: Pupils are equal, round, and reactive to light.     Comments: Scleral jaundice  Cardiovascular:     Rate and Rhythm: Normal rate. Rhythm irregular.     Pulses: Normal pulses.  Pulmonary:     Effort: Pulmonary effort is normal.  Abdominal:     Tenderness: There is abdominal tenderness.  Musculoskeletal:     Cervical back: Normal range of motion.  Skin:    Capillary Refill: Capillary refill takes less than 2 seconds.     Coloration: Skin is jaundiced.  Neurological:     Mental Status: He is alert.     Comments: Oriented to person, place, some of situation  Psychiatric:        Mood and Affect: Mood normal.   SUMMARY OF RECOMMENDATIONS Transition to comfort focused care  DNR, no extraordinary life prolonging measures  [facility] referral made   Code Status/Advance Care Planning:  DNR  Comfort Measures  Symptom Management: Dyspnea:                 - Dilaudid 0.5-'1mg'$  IVP PRN Generalized Pain:                 - Oxycodone 2.5-'5mg'$  PO Q4H PRN    Fever:                 -  Tylenol '650mg'$  Po Q6H PRN Agitation: Anxiety:                 - Lorazepam '1mg'$  IV  Q1H PRN Itching:  - Benadryl 25 mg PO Q6H PRN  - Atarax '25mg'$  PO TID  PRN  - Benadryl cream TID PRN Nausea:                 - Zofran '4mg'$  IV Q6H PRN    Secretions:                 - Glycopyrrolate 0.'4mg'$  IV Q4H PRN Dry Eyes:                 - Artificial Tears PRN Xerostomia:                 - Biotene 32m PRN                 - BID oral care Urinary Retention:                 - Bladder scan Qshift if > 3532mplace foley  Constipation:                 - Bisacodyl '10mg'$  PR PRN QDay Spiritual:                 - Chaplain consult Time Spent: 15 Greater than 50% of the time was spent in counseling and coordination of care ______________________________________________________________________________________ [facility] Team Cell Phone: [phone] utilize secure chat with additional questions, if there is no response within 30 minutes please call the above phone number  Palliative Medicine Team providers are available by phone from 7am to 7pm daily and can be reached through the team cell phone.  Should this patient require assistance outside of these hours, please call the patient's attending physician.

## 2020-01-28 NOTE — Progress Notes (Addendum)
PROGRESS NOTE    [name] [name]  DTO:[id number] DOB: 03/14/1926 DOA: 01/25/2020 PCP: Haywood Pao, MD    Brief Narrative:86 y.o.malewith medical history significant ofckd stage 3 chronic afib cva diastolic chfadmitted with generalized weakness and change in mental status reported by his caregiver. Patient lives at home with 24-hour care. caregiver also reported noticing him having jaundice, poor appetite and oral intake and weight loss for last couple weeks. No abdominal pain.  ED Course:  Labs showed NA 133,K 3.0,BUN 40,CR 2.90 (baseline around 1.3), abnormal LFTs, HB 14.6 COVID NEGATIVE UA trace leukocytes 21 to 50 wbc CT ABD -Mass in the pancreatic head may measure as large as 4.4 x 4.8 cm, associated with marked biliary and pancreatic ductal dilation. Soft tissue abuts the splenic portal venous system at the level of the root of the mesentery. Findings of what is likely a pancreatic neoplasm with biliary pancreatic ductal obstruction. Cholelithiasis.  GI consulted. Pt is a poor surgical candidate. Family is not sure whether pt will want invasive procedures done even just for palliative purpose. Palliative care consulted. Given pt's advanced age and frailty and poor mental/functional status, family has decided comfort care only. Hospice consulted. Anticipating discharge to [facility] (residential hospice) on Monday   Assessment & Plan:   Principal Problem:   Pancreatic mass Active Problems:   Acute renal failure superimposed on stage 3a chronic kidney disease (HCC)   Tachycardia   DM type 2 causing CKD stage 3 (HCC)   Biliary obstruction   Palliative care by specialist   Goals of care, counseling/discussion   Pancreatic pain   Terminal care   Pruritus   Hyponatremia   LFTs abnormal  Pancreatic cancer with pancreatic and biliary duct dilatation and with obstructive jaundice  -GI consulted but family deciding to go for hospice as the malignancy is incurable and  pt's advanced age and frailty will make him a poor candidate for any invasive procedure. GI has signed off. - appreciate palliative care and hospice input. No bed available at [facility] today. Anticipate he can be discharged tomorrow to residential hospice.  - PRN pruritus control by benadryl  Aki on ckd stage 3secondary to dehydration -Cr slowly improving with IV NS hydration 3.07->2.12 - encourage oral intake of water  type 2 diabetes, IDDM -   hold Amaryl - continue SSI and lantus  chronic atrial fibrillation on Xarelto at home; given she's going hospice, no need to control  history of stroke with residual left-sided weakness  history of obstructive sleep apnea not on CPAP at home.    Estimated body mass index is 33.91 kg/m as calculated from the following:   Height as of this encounter: _0  (1.727 m).   Weight as of this encounter: 101.2 kg.  DVT prophylaxis: None (patient will be placed on hospice on Monday) Code Status: DO NOT RESUSCITATE  family Communication: met family at bedside, update family no hospice bed today Disposition Plan plan is to discharge him with hospice on Monday or when bed becomes available to [facility]. Consultants:   GI and palliative care  Procedures: None Antimicrobials: None  Subjective: Sitting up by the side of the bed a lot of scratch marks and scabs noted; itching; family at bedside; pt is yellow like lemon  Objective: Vitals:   01/26/20 1359 01/26/20 2130 01/27/20 1439 01/28/20 0409  BP: 123/73 117/76 (!) 123/94 116/67  Pulse: 91 93 98 (!) 108  Resp: _1 Temp: (!) 97.4  F (36.3 C) 97.7 F (36.5 C) 98.3 F (36.8 C) 98.4 F (36.9 C)  TempSrc: Oral Oral Oral Oral  SpO2: 98% 98% 99% 97%  Weight:      Height:        Intake/Output Summary (Last 24 hours) at 01/28/2020 1419 Last data filed at 01/28/2020 0825 Gross per 24 hour  Intake 305 ml  Output 1100 ml  Net -795 ml   Filed Weights   01/25/20 1158    Weight: 101.2 kg    Examination: Jaundiced scleral icterus skin yellow multiple scratch marks and scabs throughout the body noted General exam: Appears calm and comfortable  Respiratory system: Clear to auscultation. Respiratory effort normal. Cardiovascular system: S1 & S2 heard, RRR. No JVD, murmurs, rubs, gallops or clicks. No pedal edema. Gastrointestinal system: Abdomen is nondistended, soft and nontender. No organomegaly or masses felt. Normal bowel sounds heard. Central nervous system: Alert and oriented. No focal neurological deficits. Extremities: Symmetric 5 x 5 power. Skin: Multiple scabs and scratch marks noted    Data Reviewed: I have personally reviewed following labs and imaging studies  CBC: Recent Labs  Lab 01/25/20 1033 01/25/20 1152 01/26/20 0132 01/27/20 0839  WBC 10.4  --  8.1 8.7  NEUTROABS 9.0*  --  6.6  --   HGB 13.1 14.6 11.0* 10.4*  HCT 38.6* 43.0 32.2* 30.3*  MCV 94.6  --  93.6 94.1  PLT 181  --  155 628*   Basic Metabolic Panel: Recent Labs  Lab 01/25/20 1033 01/25/20 1152 01/26/20 0132 01/27/20 0839 01/28/20 0328  NA 131* 133* 132* 132* 133*  K 3.0* 3.0* 2.6* 3.6 3.6  CL 91* 94* 94* 97* 98  CO2 23  --  _0 GLUCOSE 210* 200* 309* 178* 179*  BUN 43* 40* 41* 32* 28*  CREATININE 3.07* 2.90* 2.66* 2.37* 2.12*  CALCIUM 9.1  --  8.4* 8.8* 8.5*  MG  --   --  2.0  --   --    GFR: Estimated Creatinine Clearance: 25.6 mL/min (A) (by C-G formula based on SCr of 2.12 mg/dL (H)). Liver Function Tests: Recent Labs  Lab 01/25/20 1033 01/26/20 0132  AST 973* 791*  ALT 606* 501*  ALKPHOS 1,607* 1,332*  BILITOT 12.6* 10.9*  PROT 6.9 5.6*  ALBUMIN 2.8* 2.3*   Recent Labs  Lab 01/25/20 1202  LIPASE 43   Recent Labs  Lab 01/25/20 1033  AMMONIA 44*   Coagulation Profile: No results for input(s): INR, PROTIME in the last 168 hours. Cardiac Enzymes: No results for input(s): CKTOTAL, CKMB, CKMBINDEX, TROPONINI in the last 168  hours. BNP (last 3 results) No results for input(s): PROBNP in the last 8760 hours. HbA1C: No results for input(s): HGBA1C in the last 72 hours. CBG: Recent Labs  Lab 01/25/20 1003  GLUCAP 175*   Lipid Profile: No results for input(s): CHOL, HDL, LDLCALC, TRIG, CHOLHDL, LDLDIRECT in the last 72 hours. Thyroid Function Tests: No results for input(s): TSH, T4TOTAL, FREET4, T3FREE, THYROIDAB in the last 72 hours. Anemia Panel: No results for input(s): VITAMINB12, FOLATE, FERRITIN, TIBC, IRON, RETICCTPCT in the last 72 hours. Sepsis Labs: Recent Labs  Lab 01/25/20 1033 01/25/20 1202  LATICACIDVEN 2.1* 1.4    Recent Results (from the past 240 hour(s))  Culture, blood (routine x 2)     Status: None (Preliminary result)   Collection Time: 01/25/20 10:35 AM   Specimen: BLOOD  Result Value Ref Range Status   Specimen Description BLOOD RIGHT  ANTECUBITAL  Final   Special Requests   Final    BOTTLES DRAWN AEROBIC AND ANAEROBIC Blood Culture adequate volume   Culture   Final    NO GROWTH 3 DAYS Performed at [facility], [address], [place], [place] [address]    Report Status PENDING  Incomplete  Culture, blood (routine x 2)     Status: None (Preliminary result)   Collection Time: 01/25/20 11:20 AM   Specimen: BLOOD RIGHT HAND  Result Value Ref Range Status   Specimen Description BLOOD RIGHT HAND  Final   Special Requests   Final    BOTTLES DRAWN AEROBIC ONLY Blood Culture results may not be optimal due to an inadequate volume of blood received in culture bottles   Culture   Final    NO GROWTH 3 DAYS Performed at [facility], [place] [address], [place], [place] [address]    Report Status PENDING  Incomplete  Respiratory Panel by RT PCR (Flu A&B, Covid) - Nasopharyngeal Swab     Status: None   Collection Time: 01/25/20 11:47 AM   Specimen: Nasopharyngeal Swab  Result Value Ref Range Status   SARS Coronavirus 2 by RT PCR NEGATIVE NEGATIVE Final    Comment:  (NOTE) SARS-CoV-2 target nucleic acids are NOT DETECTED. The SARS-CoV-2 RNA is generally detectable in upper respiratoy specimens during the acute phase of infection. The lowest concentration of SARS-CoV-2 viral copies this assay can detect is 131 copies/mL. A negative result does not preclude SARS-Cov-2 infection and should not be used as the sole basis for treatment or other patient management decisions. A negative result may occur with  improper specimen collection/handling, submission of specimen other than nasopharyngeal swab, presence of viral mutation(s) within the areas targeted by this assay, and inadequate number of viral copies (<131 copies/mL). A negative result must be combined with clinical observations, patient history, and epidemiological information. The expected result is Negative. Fact Sheet for Patients:  PinkCheek.be Fact Sheet for Healthcare Providers:  GravelBags.it This test is not yet ap proved or cleared by the [place] FDA and  has been authorized for detection and/or diagnosis of SARS-CoV-2 by FDA under an Emergency Use Authorization (EUA). This EUA will remain  in effect (meaning this test can be used) for the duration of the COVID-19 declaration under Section 564(b)(1) of the Act, 21 U.S.C. section 360bbb-3(b)(1), unless the authorization is terminated or revoked sooner.    Influenza A by PCR NEGATIVE NEGATIVE Final   Influenza B by PCR NEGATIVE NEGATIVE Final    Comment: (NOTE) The Xpert Xpress SARS-CoV-2/FLU/RSV assay is intended as an aid in  the diagnosis of influenza from Nasopharyngeal swab specimens and  should not be used as a sole basis for treatment. Nasal washings and  aspirates are unacceptable for Xpert Xpress SARS-CoV-2/FLU/RSV  testing. Fact Sheet for Patients: PinkCheek.be Fact Sheet for Healthcare  Providers: GravelBags.it This test is not yet approved or cleared by the [place] FDA and  has been authorized for detection and/or diagnosis of SARS-CoV-2 by  FDA under an Emergency Use Authorization (EUA). This EUA will remain  in effect (meaning this test can be used) for the duration of the  Covid-19 declaration under Section 564(b)(1) of the Act, 21  U.S.C. section 360bbb-3(b)(1), unless the authorization is  terminated or revoked. Performed at [facility], [place] [address], [place], [place] [address]   Urine culture     Status: None   Collection Time: 01/25/20  2:10 PM   Specimen: Urine, Random  Result Value Ref Range Status   Specimen Description URINE, RANDOM  Final   Special Requests NONE  Final   Culture   Final    NO GROWTH Performed at [facility], [address], [place], [place] [address]    Report Status 01/26/2020 FINAL  Final         Radiology Studies: No results found.      Scheduled Meds:  Continuous Infusions:    LOS: 3 days   Coding: total 25 min on this encounter with >50% on direct patient care and plan formulation   Paticia Stack, MD 01/28/2020, 2:19 PM

## 2020-01-29 MED ORDER — LORAZEPAM 1 MG PO TABS: 1.0000 mg | ORAL_TABLET | ORAL | 0 refills | Status: AC | PRN

## 2020-01-29 MED ORDER — OXYCODONE HCL 5 MG PO TABS: 2.5000 mg | ORAL_TABLET | ORAL | 0 refills | Status: AC | PRN

## 2020-01-29 NOTE — Consult Note (Signed)
[facility] CM Inpatient Consult   01/29/2020  [name] [name] 1926-02-15 [id number]   Patient screened for high risk score for unplanned readmission to check if potential disposition needs in the [place] for Care Management services.Patient with [facility].    Review of patient's medical record reveals patient disposition is for [facility] today.  Patient with past history with [facility].  Plan:  Will sign off as patient will have complex care management needs met at [facility]..    Please place a [facility] Care Management consult as appropriate and for questions contact:   Natividad Brood, RN BSN [facility] Liaison  [phone] business mobile phone Toll free office [phone]  Fax number: [phone] [email or web address]

## 2020-01-29 NOTE — Social Work (Signed)
Pt has bed available today at [facility].  Paperwork completed, MD messaged for DNR to be completed and discharge summary/orders.    Westley Hummer, MSW, [place] Work

## 2020-01-29 NOTE — Progress Notes (Signed)
[facility] room available for Mr. Cedotal today. Spoke with son and daughter to confirm plan to transfer today. Paperwork has been completed.   Please send discharge summary to [phone].  RN please call report to [phone] prior to patient leaving the unit.   Thank you,  Erling Conte, LCSW [phone]

## 2020-01-29 NOTE — Social Work (Signed)
Clinical Social Worker facilitated patient discharge including contacting patient family and facility to confirm patient discharge plans.  Clinical information faxed to facility and family agreeable with plan.  CSW arranged ambulance transport via [facility] to [facility].  RN to call [phone] with report prior to discharge.  Clinical Social Worker will sign off for now as social work intervention is no longer needed. Please consult us again if new need arises.  Nissa Stannard, MSW, LCSW Clinical Social Worker   

## 2020-01-29 NOTE — Discharge Instructions (Signed)

## 2020-01-29 NOTE — Discharge Summary (Signed)
Physician Discharge Summary  [name] [name] [id number] DOB: April 02, 1926 DOA: 01/25/2020  PCP: Haywood Pao, MD  Admit date: 01/25/2020 Discharge date: 01/29/2020  Admitted From: home Disposition:  [facility], hospice  Recommendations for Outpatient Follow-up:  1. None  Discharge Condition: Poor CODE STATUS: DNR  Diet recommendation: General  Brief/Interim Summary: Patient is a 86 year old male with a history of CKD 3, atrial fibrillation, CVA, and diastolic heart failure who was admitted for generalized weakness and altered mental status.  He also had yellowing skin, poor appetite and decreased oral intake.  Work-up showed likely pancreatic neoplasm with biliary pancreatic ductal obstruction and gallstones.  GI consulted, poor surgical candidate.  Given the patient's advanced age and frailty/mental status, the family decided comfort care and hospice was consulted.  Subjective on day of discharge: Patient has no complaints.  Pleasantly demented. Most home meds to be discontinued. Pain and anxiety medications rx'd until he can get medication with the hospice team.   Discharge Diagnoses:  Principal Problem:   Pancreatic mass Active Problems:   Acute renal failure superimposed on stage 3a chronic kidney disease (HCC)   Tachycardia   DM type 2 causing CKD stage 3 (HCC)   Biliary obstruction   Palliative care by specialist   Goals of care, counseling/discussion   Pancreatic pain   Terminal care   Pruritus   Hyponatremia   LFTs abnormal   Discharge Instructions  Discharge Instructions    Diet general   Complete by: As directed    Increase activity slowly   Complete by: As directed      Allergies as of 01/29/2020      Reactions   Sulfa Antibiotics Swelling   Swelling of hands      Medication List    STOP taking these medications   atorvastatin 20 MG tablet Commonly known as: LIPITOR   donepezil 10 MG tablet Commonly known as: ARICEPT   finasteride 5 MG  tablet Commonly known as: PROSCAR   furosemide 20 MG tablet Commonly known as: LASIX   glimepiride 2 MG tablet Commonly known as: AMARYL   insulin aspart 100 UNIT/ML injection Commonly known as: novoLOG   insulin glargine 100 UNIT/ML injection Commonly known as: LANTUS   metoprolol succinate 50 MG 24 hr tablet Commonly known as: TOPROL-XL   mupirocin ointment 2 % Commonly known as: BACTROBAN   Rivaroxaban 15 MG Tabs tablet Commonly known as: XARELTO   rivaroxaban 20 MG Tabs tablet Commonly known as: XARELTO     TAKE these medications   LORazepam 1 MG tablet Commonly known as: Ativan Take 1 tablet (1 mg total) by mouth every 4 (four) hours as needed for anxiety, seizure or sedation.   Melatonin 10 MG Tabs Take 10 mg by mouth at bedtime.   oxyCODONE 5 MG immediate release tablet Commonly known as: Oxy IR/ROXICODONE Take 0.5-1 tablets (2.5-5 mg total) by mouth every 4 (four) hours as needed for moderate pain or severe pain.       Allergies  Allergen Reactions  . Sulfa Antibiotics Swelling    Swelling of hands    Consultations:  Gastroenterology  Palliative care  Procedures/Studies: CT ABDOMEN PELVIS WO CONTRAST  Result Date: 01/25/2020 CLINICAL DATA:  Jaundice EXAM: CT ABDOMEN AND PELVIS WITHOUT CONTRAST TECHNIQUE: Multidetector CT imaging of the abdomen and pelvis was performed following the standard protocol without IV contrast. COMPARISON:  CT angiography of the chest dated 09/27/2019 and CT of the abdomen and pelvis from 2005 FINDINGS: Lower chest: No  signs of consolidation or evidence of pleural effusion. No pericardial fluid. Hepatobiliary: Massive biliary ductal distension. Masslike appearance of pancreatic head may measure as large as 4.4 x 4.8 cm, associated with ductal dilation of the pancreas. Soft tissue abuts the splenic portal venous system at the level of the root of the mesentery. Stranding extends towards the SMA. Not well assessed without  contrast. Cholelithiasis. Pancreas: As above with mass in the pancreatic head and biliary and pancreatic ductal dilation. Spleen: Normal spleen. Adrenals/Urinary Tract: Normal adrenal glands. Renal cortical scarring. Cysts of varying complexity bilaterally. Renal vascular calcification. No hydronephrosis or ureteral calculus. Stomach/Bowel: No signs of acute gastrointestinal process. Colonic diverticulosis. Vascular/Lymphatic: Calcified atherosclerotic plaque throughout the abdominal aorta. No aneurysm. No adenopathy in the upper abdomen. No signs of pelvic lymphadenopathy. Reproductive: Prostate with calcification and heterogeneity likely with TURP. Other: No abdominal wall hernia or abnormality. No abdominopelvic ascites. Musculoskeletal: Spinal degenerative changes and signs of lumbosacral spinal fusion. No signs of acute bone finding or destructive bone process. IMPRESSION: 1. Mass in the pancreatic head may measure as large as 4.4 x 4.8 cm, associated with marked biliary and pancreatic ductal dilation. Soft tissue abuts the splenic portal venous system at the level of the root of the mesentery. Findings of what is likely a pancreatic neoplasm with biliary pancreatic ductal obstruction. Contrasted imaging or endoscopic assessment may be helpful. 2. Cholelithiasis. 3. Renal cortical scarring with multiple cysts of varying complexity bilaterally. No hydronephrosis or ureteral calculus. 4. Colonic diverticulosis. Aortic Atherosclerosis (ICD10-I70.0). Electronically Signed   By: Zetta Bills M.D.   On: 01/25/2020 14:49   CT Head Wo Contrast  Result Date: 01/25/2020 CLINICAL DATA:  Altered level of consciousness, weakness, confusion EXAM: CT HEAD WITHOUT CONTRAST TECHNIQUE: Contiguous axial images were obtained from the base of the skull through the vertex without intravenous contrast. COMPARISON:  09/27/2019 FINDINGS: Brain: Chronic confluent hypodensities throughout the periventricular and subcortical white  matter consistent with chronic small vessel ischemic changes. No signs of acute infarct or hemorrhage. Ex vacuo dilatation of lateral ventricles again noted and stable. Remaining midline structures are unremarkable. No acute extra-axial fluid collections. No mass effect. Vascular: No hyperdense vessel or unexpected calcification. Skull: Normal. Negative for fracture or focal lesion. Sinuses/Orbits: No acute finding. Other: None IMPRESSION: 1. Chronic ischemic changes as above.  No acute process. Electronically Signed   By: Randa Ngo M.D.   On: 01/25/2020 11:09   DG Chest Portable 1 View  Result Date: 01/25/2020 CLINICAL DATA:  Altered mental status. EXAM: PORTABLE CHEST 1 VIEW COMPARISON:  September 27, 2019. FINDINGS: The heart size and mediastinal contours are within normal limits. Both lungs are clear. The visualized skeletal structures are unremarkable. IMPRESSION: No active disease. Aortic Atherosclerosis (ICD10-I70.0). Electronically Signed   By: Marijo Conception M.D.   On: 01/25/2020 10:15      Discharge Exam: Vitals:   01/28/20 0409 01/28/20 2040  BP: 116/67 116/75  Pulse: (!) 108 (!) 109  Resp: 17 20  Temp: 98.4 F (36.9 C) 98.1 F (36.7 C)  SpO2: 97% 96%    General: Pt is awake Respiratory: no conversational dyspnea  Skin: +jaundice Psych: Normal mood and affect     The results of significant diagnostics from this hospitalization (including imaging, microbiology, ancillary and laboratory) are listed below for reference.     Microbiology: Recent Results (from the past 240 hour(s))  Culture, blood (routine x 2)     Status: None (Preliminary result)   Collection Time: 01/25/20  10:35 AM   Specimen: BLOOD  Result Value Ref Range Status   Specimen Description BLOOD RIGHT ANTECUBITAL  Final   Special Requests   Final    BOTTLES DRAWN AEROBIC AND ANAEROBIC Blood Culture adequate volume   Culture   Final    NO GROWTH 3 DAYS Performed at [facility], [address], [place], [place] [address]    Report Status PENDING  Incomplete  Culture, blood (routine x 2)     Status: None (Preliminary result)   Collection Time: 01/25/20 11:20 AM   Specimen: BLOOD RIGHT HAND  Result Value Ref Range Status   Specimen Description BLOOD RIGHT HAND  Final   Special Requests   Final    BOTTLES DRAWN AEROBIC ONLY Blood Culture results may not be optimal due to an inadequate volume of blood received in culture bottles   Culture   Final    NO GROWTH 3 DAYS Performed at [facility], [place] [address] [place], [place] [address]    Report Status PENDING  Incomplete  Respiratory Panel by RT PCR (Flu A&B, Covid) - Nasopharyngeal Swab     Status: None   Collection Time: 01/25/20 11:47 AM   Specimen: Nasopharyngeal Swab  Result Value Ref Range Status   SARS Coronavirus 2 by RT PCR NEGATIVE NEGATIVE Final    Comment: (NOTE) SARS-CoV-2 target nucleic acids are NOT DETECTED. The SARS-CoV-2 RNA is generally detectable in upper respiratoy specimens during the acute phase of infection. The lowest concentration of SARS-CoV-2 viral copies this assay can detect is 131 copies/mL. A negative result does not preclude SARS-Cov-2 infection and should not be used as the sole basis for treatment or other patient management decisions. A negative result may occur with  improper specimen collection/handling, submission of specimen other than nasopharyngeal swab, presence of viral mutation(s) within the areas targeted by this assay, and inadequate number of viral copies (<131 copies/mL). A negative result must be combined with clinical observations, patient history, and epidemiological information. The expected result is Negative. Fact Sheet for Patients:  PinkCheek.be Fact Sheet for Healthcare Providers:  GravelBags.it This test is not yet ap proved or cleared by the [place] FDA and  has been authorized for detection  and/or diagnosis of SARS-CoV-2 by FDA under an Emergency Use Authorization (EUA). This EUA will remain  in effect (meaning this test can be used) for the duration of the COVID-19 declaration under Section 564(b)(1) of the Act, 21 U.S.C. section 360bbb-3(b)(1), unless the authorization is terminated or revoked sooner.    Influenza A by PCR NEGATIVE NEGATIVE Final   Influenza B by PCR NEGATIVE NEGATIVE Final    Comment: (NOTE) The Xpert Xpress SARS-CoV-2/FLU/RSV assay is intended as an aid in  the diagnosis of influenza from Nasopharyngeal swab specimens and  should not be used as a sole basis for treatment. Nasal washings and  aspirates are unacceptable for Xpert Xpress SARS-CoV-2/FLU/RSV  testing. Fact Sheet for Patients: PinkCheek.be Fact Sheet for Healthcare Providers: GravelBags.it This test is not yet approved or cleared by the [place] FDA and  has been authorized for detection and/or diagnosis of SARS-CoV-2 by  FDA under an Emergency Use Authorization (EUA). This EUA will remain  in effect (meaning this test can be used) for the duration of the  Covid-19 declaration under Section 564(b)(1) of the Act, 21  U.S.C. section 360bbb-3(b)(1), unless the authorization is  terminated or revoked. Performed at [facility], [place],  [place] [address]   Urine culture     Status: None   Collection Time: 01/25/20  2:10 PM   Specimen: Urine, Random  Result Value Ref Range Status   Specimen Description URINE, RANDOM  Final   Special Requests NONE  Final   Culture   Final    NO GROWTH Performed at [facility], [place] [address], [place], [place] [address]    Report Status 01/26/2020 FINAL  Final     Labs: BNP (last 3 results) Recent Labs    02/25/19 0612 09/27/19 2212  BNP 153.5* 123XX123*   Basic Metabolic Panel: Recent Labs  Lab 01/25/20 1033 01/25/20 1152 01/26/20 0132 01/27/20 0839  01/28/20 0328  NA 131* 133* 132* 132* 133*  K 3.0* 3.0* 2.6* 3.6 3.6  CL 91* 94* 94* 97* 98  CO2 23  --  24 22 25   GLUCOSE 210* 200* 309* 178* 179*  BUN 43* 40* 41* 32* 28*  CREATININE 3.07* 2.90* 2.66* 2.37* 2.12*  CALCIUM 9.1  --  8.4* 8.8* 8.5*  MG  --   --  2.0  --   --    Liver Function Tests: Recent Labs  Lab 01/25/20 1033 01/26/20 0132  AST 973* 791*  ALT 606* 501*  ALKPHOS 1,607* 1,332*  BILITOT 12.6* 10.9*  PROT 6.9 5.6*  ALBUMIN 2.8* 2.3*   Recent Labs  Lab 01/25/20 1202  LIPASE 43   Recent Labs  Lab 01/25/20 1033  AMMONIA 44*   CBC: Recent Labs  Lab 01/25/20 1033 01/25/20 1152 01/26/20 0132 01/27/20 0839  WBC 10.4  --  8.1 8.7  NEUTROABS 9.0*  --  6.6  --   HGB 13.1 14.6 11.0* 10.4*  HCT 38.6* 43.0 32.2* 30.3*  MCV 94.6  --  93.6 94.1  PLT 181  --  155 145*   CBG: Recent Labs  Lab 01/25/20 1003  GLUCAP 175*   Urinalysis    Component Value Date/Time   COLORURINE AMBER (A) 01/25/2020 1410   APPEARANCEUR HAZY (A) 01/25/2020 1410   LABSPEC 1.011 01/25/2020 1410   PHURINE 5.0 01/25/2020 1410   GLUCOSEU NEGATIVE 01/25/2020 1410   HGBUR LARGE (A) 01/25/2020 1410   BILIRUBINUR NEGATIVE 01/25/2020 1410   KETONESUR NEGATIVE 01/25/2020 1410   PROTEINUR 30 (A) 01/25/2020 1410   UROBILINOGEN 1.0 01/21/2012 2347   NITRITE NEGATIVE 01/25/2020 1410   LEUKOCYTESUR TRACE (A) 01/25/2020 1410   Microbiology Recent Results (from the past 240 hour(s))  Culture, blood (routine x 2)     Status: None (Preliminary result)   Collection Time: 01/25/20 10:35 AM   Specimen: BLOOD  Result Value Ref Range Status   Specimen Description BLOOD RIGHT ANTECUBITAL  Final   Special Requests   Final    BOTTLES DRAWN AEROBIC AND ANAEROBIC Blood Culture adequate volume   Culture   Final    NO GROWTH 3 DAYS Performed at [facility], [address], [place], [place] [address]    Report Status PENDING  Incomplete  Culture, blood (routine x 2)     Status: None  (Preliminary result)   Collection Time: 01/25/20 11:20 AM   Specimen: BLOOD RIGHT HAND  Result Value Ref Range Status   Specimen Description BLOOD RIGHT HAND  Final   Special Requests   Final    BOTTLES DRAWN AEROBIC ONLY Blood Culture results may not be optimal due to an inadequate volume of blood received in culture bottles   Culture   Final  NO GROWTH 3 DAYS Performed at [facility], [place] [address] [place], [place] [address]    Report Status PENDING  Incomplete  Respiratory Panel by RT PCR (Flu A&B, Covid) - Nasopharyngeal Swab     Status: None   Collection Time: 01/25/20 11:47 AM   Specimen: Nasopharyngeal Swab  Result Value Ref Range Status   SARS Coronavirus 2 by RT PCR NEGATIVE NEGATIVE Final    Comment: (NOTE) SARS-CoV-2 target nucleic acids are NOT DETECTED. The SARS-CoV-2 RNA is generally detectable in upper respiratoy specimens during the acute phase of infection. The lowest concentration of SARS-CoV-2 viral copies this assay can detect is 131 copies/mL. A negative result does not preclude SARS-Cov-2 infection and should not be used as the sole basis for treatment or other patient management decisions. A negative result may occur with  improper specimen collection/handling, submission of specimen other than nasopharyngeal swab, presence of viral mutation(s) within the areas targeted by this assay, and inadequate number of viral copies (<131 copies/mL). A negative result must be combined with clinical observations, patient history, and epidemiological information. The expected result is Negative. Fact Sheet for Patients:  PinkCheek.be Fact Sheet for Healthcare Providers:  GravelBags.it This test is not yet ap proved or cleared by the [place] FDA and  has been authorized for detection and/or diagnosis of SARS-CoV-2 by FDA under an Emergency Use Authorization (EUA). This EUA will remain  in effect  (meaning this test can be used) for the duration of the COVID-19 declaration under Section 564(b)(1) of the Act, 21 U.S.C. section 360bbb-3(b)(1), unless the authorization is terminated or revoked sooner.    Influenza A by PCR NEGATIVE NEGATIVE Final   Influenza B by PCR NEGATIVE NEGATIVE Final    Comment: (NOTE) The Xpert Xpress SARS-CoV-2/FLU/RSV assay is intended as an aid in  the diagnosis of influenza from Nasopharyngeal swab specimens and  should not be used as a sole basis for treatment. Nasal washings and  aspirates are unacceptable for Xpert Xpress SARS-CoV-2/FLU/RSV  testing. Fact Sheet for Patients: PinkCheek.be Fact Sheet for Healthcare Providers: GravelBags.it This test is not yet approved or cleared by the [place] FDA and  has been authorized for detection and/or diagnosis of SARS-CoV-2 by  FDA under an Emergency Use Authorization (EUA). This EUA will remain  in effect (meaning this test can be used) for the duration of the  Covid-19 declaration under Section 564(b)(1) of the Act, 21  U.S.C. section 360bbb-3(b)(1), unless the authorization is  terminated or revoked. Performed at [facility], [place] [address] [place], [place] [address]   Urine culture     Status: None   Collection Time: 01/25/20  2:10 PM   Specimen: Urine, Random  Result Value Ref Range Status   Specimen Description URINE, RANDOM  Final   Special Requests NONE  Final   Culture   Final    NO GROWTH Performed at [facility], [place] [address], [place], [place] [address]    Report Status 01/26/2020 FINAL  Final     Patient was seen and examined on the day of discharge and was found to be in stable condition. Time coordinating discharge: 40 minutes including assessment and coordination of care, as well as examination of the patient.   SIGNED:  Shelda Pal, DO [facility] 01/29/2020, 11:05 AM

## 2020-01-29 NOTE — Progress Notes (Signed)
Pt discharging to [facility].  Information packet gathered by SW was sent with transporters for facility. No family present at time of discharging.  Pt d/c'd with belongings, transported by [facility].   Nurse Linna Hoff from [facility] was given a report at approx 1525. When nurse was available to give a report.

## 2020-01-30 LAB — CULTURE, BLOOD (ROUTINE X 2)
Culture: NO GROWTH
Culture: NO GROWTH
Special Requests: ADEQUATE

## 2020-02-19 DEATH — deceased

## 2024-09-11 NOTE — Progress Notes (Signed)
 [name] [name]
